# Patient Record
Sex: Male | Born: 1955 | Race: White | Hispanic: No | Marital: Single | Smoking: Current every day smoker
Health system: Southern US, Community
[De-identification: ages and names within clinical notes are randomized; demographics above are authoritative.]

## PROBLEM LIST (undated history)

## (undated) DIAGNOSIS — F329 Major depressive disorder, single episode, unspecified: Secondary | ICD-10-CM

## (undated) DIAGNOSIS — I10 Essential (primary) hypertension: Secondary | ICD-10-CM

## (undated) DIAGNOSIS — F419 Anxiety disorder, unspecified: Secondary | ICD-10-CM

## (undated) DIAGNOSIS — K219 Gastro-esophageal reflux disease without esophagitis: Secondary | ICD-10-CM

## (undated) DIAGNOSIS — I779 Disorder of arteries and arterioles, unspecified: Secondary | ICD-10-CM

## (undated) DIAGNOSIS — R569 Unspecified convulsions: Secondary | ICD-10-CM

## (undated) DIAGNOSIS — F32A Depression, unspecified: Secondary | ICD-10-CM

## (undated) DIAGNOSIS — I671 Cerebral aneurysm, nonruptured: Secondary | ICD-10-CM

## (undated) DIAGNOSIS — J449 Chronic obstructive pulmonary disease, unspecified: Secondary | ICD-10-CM

## (undated) DIAGNOSIS — E785 Hyperlipidemia, unspecified: Secondary | ICD-10-CM

## (undated) DIAGNOSIS — G4730 Sleep apnea, unspecified: Secondary | ICD-10-CM

## (undated) DIAGNOSIS — S0990XA Unspecified injury of head, initial encounter: Secondary | ICD-10-CM

## (undated) HISTORY — DX: Cerebral aneurysm, nonruptured: I67.1

## (undated) HISTORY — DX: Major depressive disorder, single episode, unspecified: F32.9

## (undated) HISTORY — DX: Unspecified injury of head, initial encounter: S09.90XA

## (undated) HISTORY — DX: Anxiety disorder, unspecified: F41.9

## (undated) HISTORY — DX: Hyperlipidemia, unspecified: E78.5

## (undated) HISTORY — DX: Depression, unspecified: F32.A

## (undated) HISTORY — DX: Essential (primary) hypertension: I10

## (undated) HISTORY — DX: Gastro-esophageal reflux disease without esophagitis: K21.9

## (undated) HISTORY — DX: Unspecified convulsions: R56.9

## (undated) HISTORY — DX: Sleep apnea, unspecified: G47.30

## (undated) HISTORY — DX: Chronic obstructive pulmonary disease, unspecified: J44.9

---

## 2004-06-15 ENCOUNTER — Emergency Department (HOSPITAL_COMMUNITY): Admission: EM | Admit: 2004-06-15 | Discharge: 2004-06-16 | Payer: Self-pay | Admitting: Emergency Medicine

## 2004-06-21 ENCOUNTER — Emergency Department (HOSPITAL_COMMUNITY): Admission: EM | Admit: 2004-06-21 | Discharge: 2004-06-22 | Payer: Self-pay | Admitting: *Deleted

## 2004-09-12 ENCOUNTER — Emergency Department (HOSPITAL_COMMUNITY): Admission: EM | Admit: 2004-09-12 | Discharge: 2004-09-12 | Payer: Self-pay | Admitting: *Deleted

## 2004-09-13 ENCOUNTER — Ambulatory Visit: Payer: Self-pay | Admitting: Physical Medicine & Rehabilitation

## 2004-09-13 ENCOUNTER — Inpatient Hospital Stay (HOSPITAL_COMMUNITY): Admission: AD | Admit: 2004-09-13 | Discharge: 2004-10-16 | Payer: Self-pay

## 2004-10-16 ENCOUNTER — Inpatient Hospital Stay (HOSPITAL_COMMUNITY)
Admission: RE | Admit: 2004-10-16 | Discharge: 2004-11-05 | Payer: Self-pay | Admitting: Physical Medicine & Rehabilitation

## 2004-10-16 ENCOUNTER — Ambulatory Visit: Payer: Self-pay | Admitting: Physical Medicine & Rehabilitation

## 2004-11-25 ENCOUNTER — Emergency Department (HOSPITAL_COMMUNITY): Admission: EM | Admit: 2004-11-25 | Discharge: 2004-11-25 | Payer: Self-pay | Admitting: Emergency Medicine

## 2004-11-28 ENCOUNTER — Ambulatory Visit (HOSPITAL_COMMUNITY)
Admission: RE | Admit: 2004-11-28 | Discharge: 2004-11-28 | Payer: Self-pay | Admitting: Physical Medicine & Rehabilitation

## 2004-12-03 ENCOUNTER — Ambulatory Visit: Payer: Self-pay | Admitting: Family Medicine

## 2004-12-09 ENCOUNTER — Ambulatory Visit (HOSPITAL_COMMUNITY): Admission: RE | Admit: 2004-12-09 | Discharge: 2004-12-09 | Payer: Self-pay | Admitting: Family Medicine

## 2004-12-21 ENCOUNTER — Emergency Department (HOSPITAL_COMMUNITY): Admission: EM | Admit: 2004-12-21 | Discharge: 2004-12-21 | Payer: Self-pay | Admitting: Emergency Medicine

## 2004-12-22 ENCOUNTER — Encounter
Admission: RE | Admit: 2004-12-22 | Discharge: 2005-03-22 | Payer: Self-pay | Admitting: Physical Medicine & Rehabilitation

## 2004-12-22 ENCOUNTER — Ambulatory Visit: Payer: Self-pay | Admitting: Orthopedic Surgery

## 2004-12-22 ENCOUNTER — Ambulatory Visit: Payer: Self-pay | Admitting: Physical Medicine & Rehabilitation

## 2004-12-31 ENCOUNTER — Ambulatory Visit: Payer: Self-pay | Admitting: Cardiology

## 2005-01-05 ENCOUNTER — Ambulatory Visit: Payer: Self-pay | Admitting: Cardiology

## 2005-01-05 ENCOUNTER — Ambulatory Visit (HOSPITAL_COMMUNITY): Admission: RE | Admit: 2005-01-05 | Discharge: 2005-01-05 | Payer: Self-pay | Admitting: Cardiology

## 2005-01-13 ENCOUNTER — Emergency Department (HOSPITAL_COMMUNITY): Admission: EM | Admit: 2005-01-13 | Discharge: 2005-01-13 | Payer: Self-pay | Admitting: Emergency Medicine

## 2005-01-14 ENCOUNTER — Ambulatory Visit: Payer: Self-pay | Admitting: Family Medicine

## 2005-01-29 ENCOUNTER — Ambulatory Visit (HOSPITAL_COMMUNITY): Admission: RE | Admit: 2005-01-29 | Discharge: 2005-01-29 | Payer: Self-pay | Admitting: Family Medicine

## 2005-02-05 ENCOUNTER — Emergency Department (HOSPITAL_COMMUNITY): Admission: EM | Admit: 2005-02-05 | Discharge: 2005-02-05 | Payer: Self-pay | Admitting: Emergency Medicine

## 2005-02-12 ENCOUNTER — Ambulatory Visit: Payer: Self-pay | Admitting: Physical Medicine & Rehabilitation

## 2005-03-16 ENCOUNTER — Ambulatory Visit: Payer: Self-pay | Admitting: Family Medicine

## 2005-03-24 ENCOUNTER — Encounter
Admission: RE | Admit: 2005-03-24 | Discharge: 2005-06-22 | Payer: Self-pay | Admitting: Physical Medicine & Rehabilitation

## 2005-04-15 ENCOUNTER — Ambulatory Visit: Payer: Self-pay | Admitting: Family Medicine

## 2005-04-17 ENCOUNTER — Ambulatory Visit: Payer: Self-pay | Admitting: Family Medicine

## 2005-04-24 ENCOUNTER — Ambulatory Visit: Payer: Self-pay | Admitting: Family Medicine

## 2005-04-24 ENCOUNTER — Ambulatory Visit: Payer: Self-pay | Admitting: Physical Medicine & Rehabilitation

## 2005-05-04 ENCOUNTER — Ambulatory Visit (HOSPITAL_COMMUNITY)
Admission: RE | Admit: 2005-05-04 | Discharge: 2005-05-04 | Payer: Self-pay | Admitting: Physical Medicine & Rehabilitation

## 2005-05-06 ENCOUNTER — Ambulatory Visit: Payer: Self-pay | Admitting: Family Medicine

## 2005-05-19 ENCOUNTER — Ambulatory Visit (HOSPITAL_BASED_OUTPATIENT_CLINIC_OR_DEPARTMENT_OTHER)
Admission: RE | Admit: 2005-05-19 | Discharge: 2005-05-19 | Payer: Self-pay | Admitting: Physical Medicine & Rehabilitation

## 2005-05-24 ENCOUNTER — Ambulatory Visit: Payer: Self-pay | Admitting: Internal Medicine

## 2005-06-01 ENCOUNTER — Ambulatory Visit: Payer: Self-pay | Admitting: Family Medicine

## 2005-07-21 ENCOUNTER — Encounter
Admission: RE | Admit: 2005-07-21 | Discharge: 2005-10-19 | Payer: Self-pay | Admitting: Physical Medicine & Rehabilitation

## 2005-07-21 ENCOUNTER — Ambulatory Visit: Payer: Self-pay | Admitting: Physical Medicine & Rehabilitation

## 2005-09-20 ENCOUNTER — Emergency Department (HOSPITAL_COMMUNITY): Admission: EM | Admit: 2005-09-20 | Discharge: 2005-09-20 | Payer: Self-pay | Admitting: Emergency Medicine

## 2005-10-14 ENCOUNTER — Encounter
Admission: RE | Admit: 2005-10-14 | Discharge: 2006-01-12 | Payer: Self-pay | Admitting: Physical Medicine & Rehabilitation

## 2005-10-31 ENCOUNTER — Emergency Department (HOSPITAL_COMMUNITY): Admission: EM | Admit: 2005-10-31 | Discharge: 2005-10-31 | Payer: Self-pay | Admitting: Emergency Medicine

## 2005-11-15 ENCOUNTER — Inpatient Hospital Stay (HOSPITAL_COMMUNITY): Admission: EM | Admit: 2005-11-15 | Discharge: 2005-11-17 | Payer: Self-pay | Admitting: Emergency Medicine

## 2005-12-10 ENCOUNTER — Inpatient Hospital Stay (HOSPITAL_COMMUNITY): Admission: EM | Admit: 2005-12-10 | Discharge: 2005-12-14 | Payer: Self-pay | Admitting: Emergency Medicine

## 2005-12-23 IMAGING — CT CT HEAD W/O CM
1 series · 15 of 30 positions shown, 19 images · non-contrast
Comparison: 09/20/2004.

CLINICAL DATA: Followup right frontal hemorrhagic contusion and
intraventricular hemorrhage.

HEAD CT WITHOUT CONTRAST
TECHNIQUE: 5mm collimated images were obtained from the base of the skull
through the vertex according to standard protocol without contrast.

[Series 2: brain · axial · 0.47mm/px · z∈[+149,+291]mm · 15 of 30 slices shown, 19 images]
[im 2/30  brain]
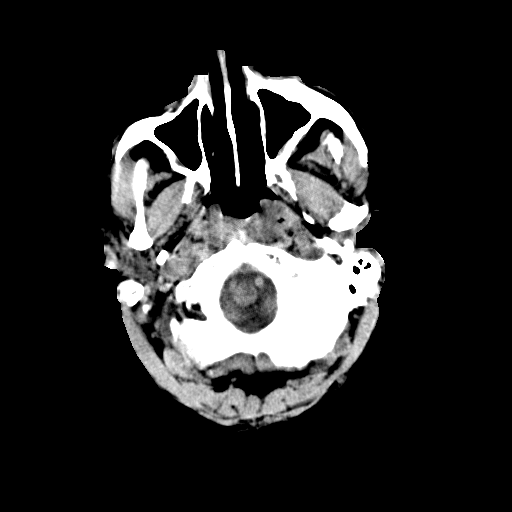
[im 2/30  bone]
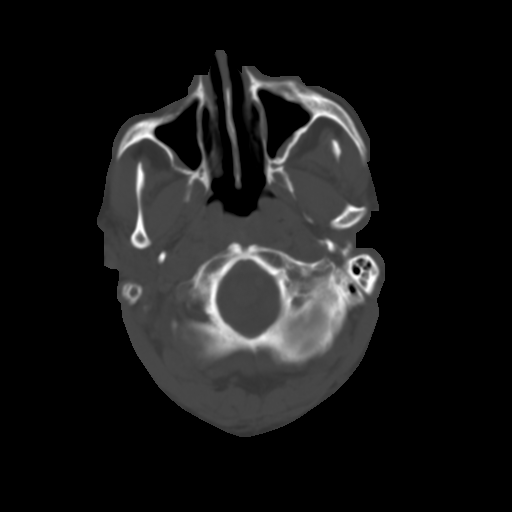
[im 4/30  brain]
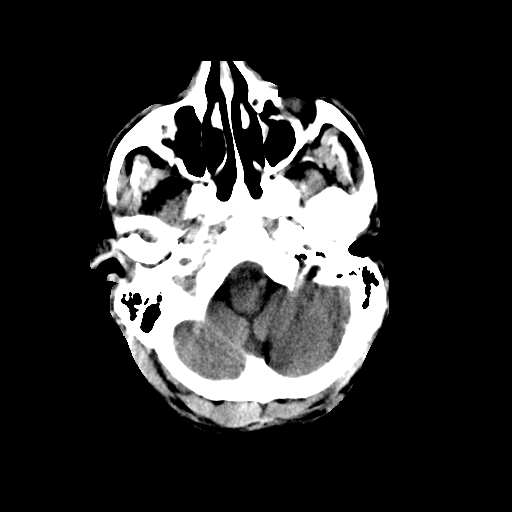
[im 6/30  brain]
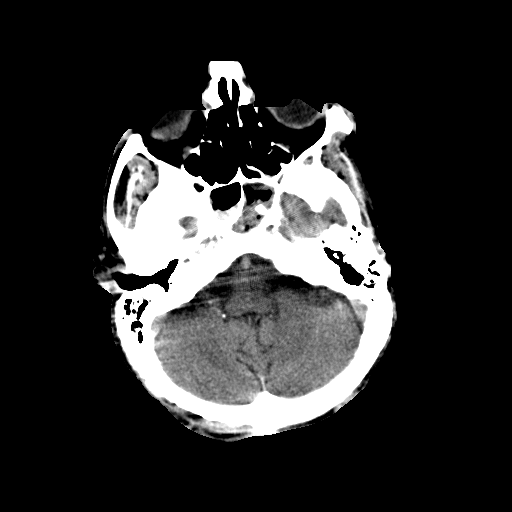
[im 8/30  brain]
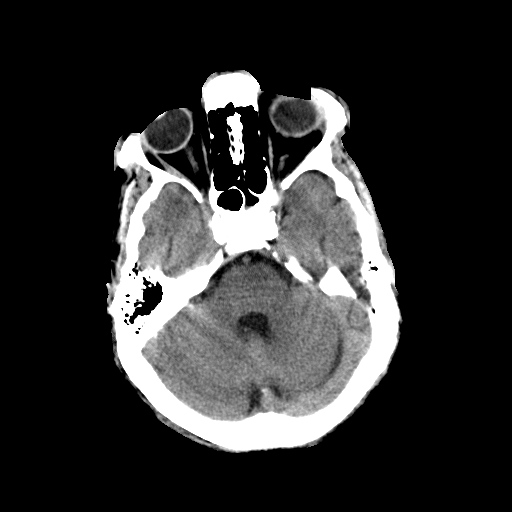
[im 10/30  brain]
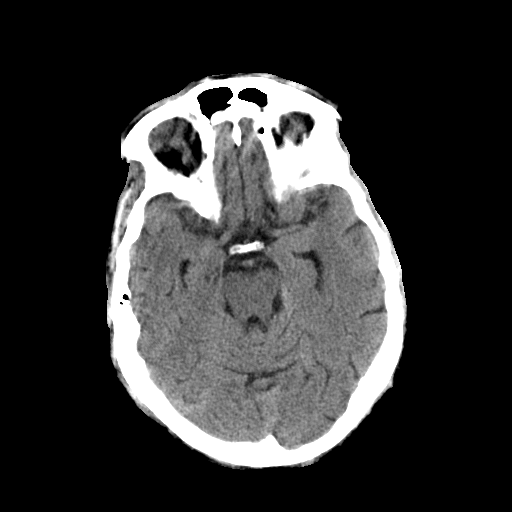
[im 10/30  bone]
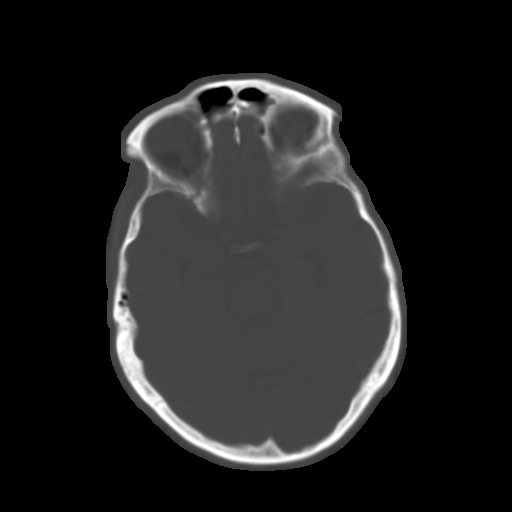
[im 12/30  brain]
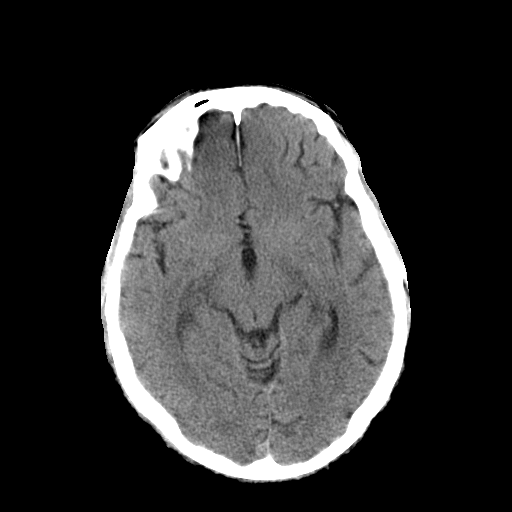
[im 14/30  brain]
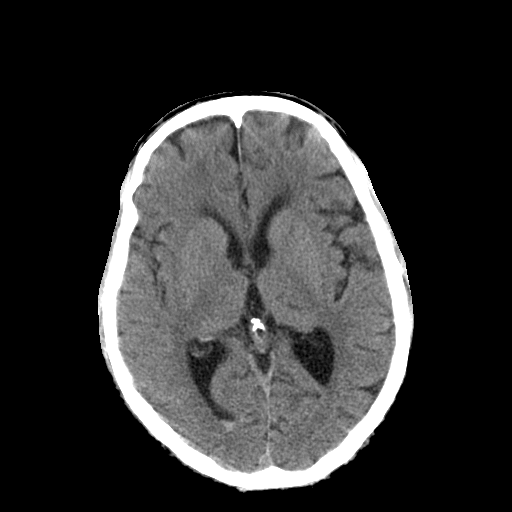
[im 16/30  brain]
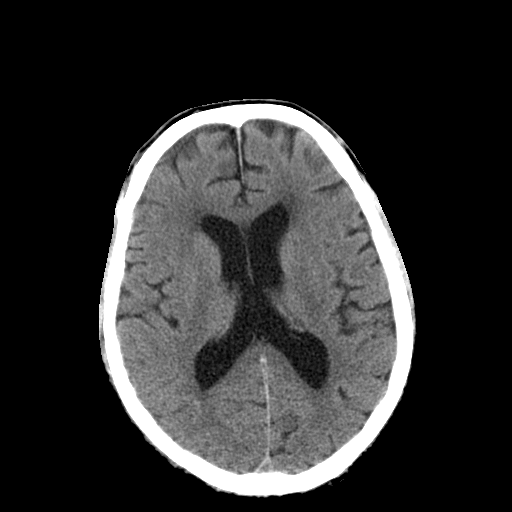
[im 17/30  brain]
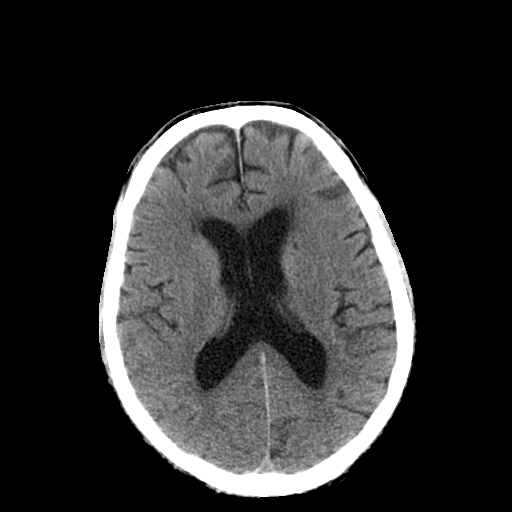
[im 17/30  bone]
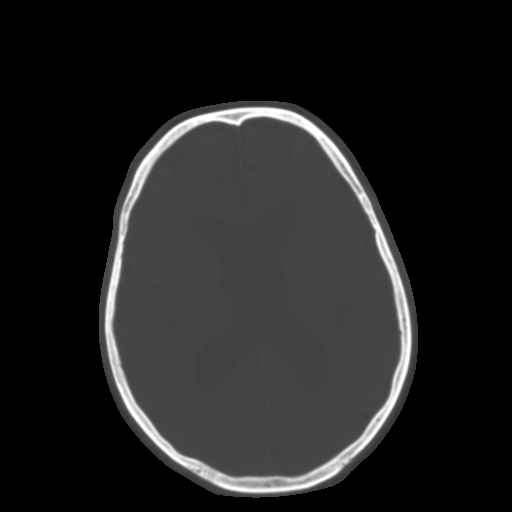
[im 19/30  brain]
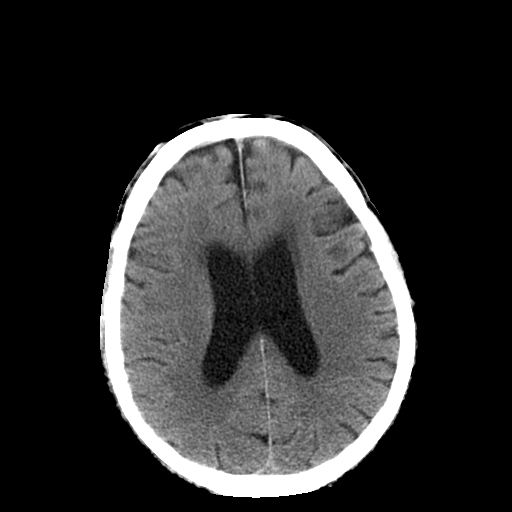
[im 21/30  brain]
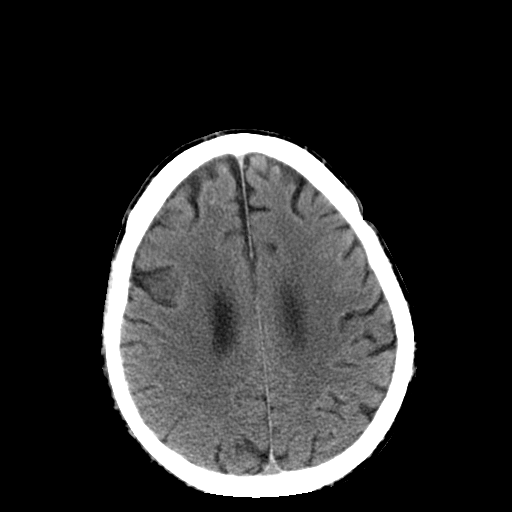
[im 23/30  brain]
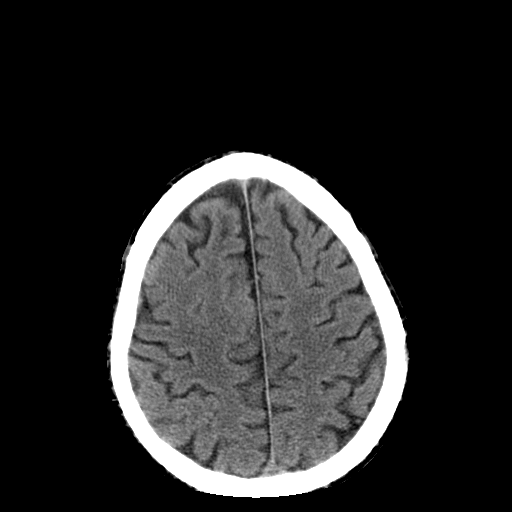
[im 25/30  brain]
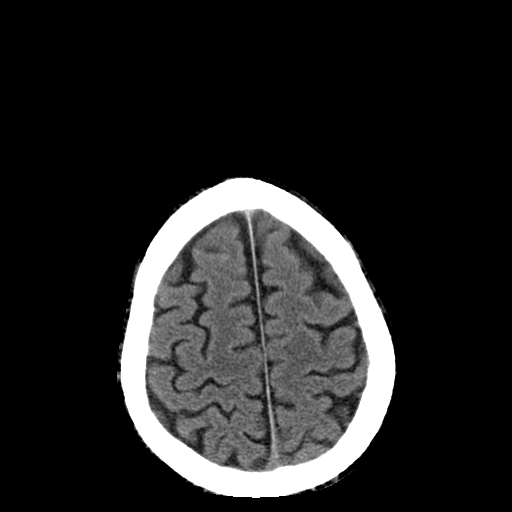
[im 25/30  bone]
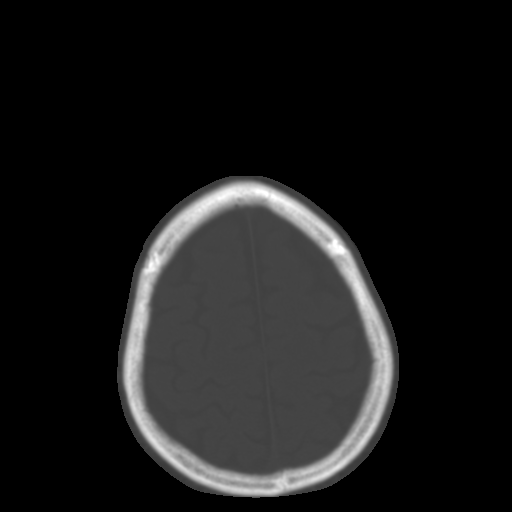
[im 27/30  brain]
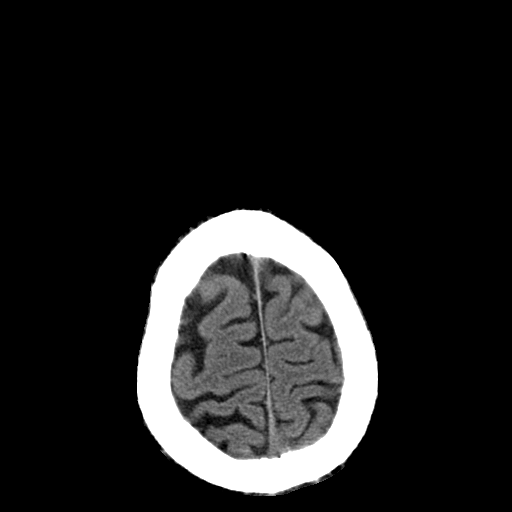
[im 29/30  brain]
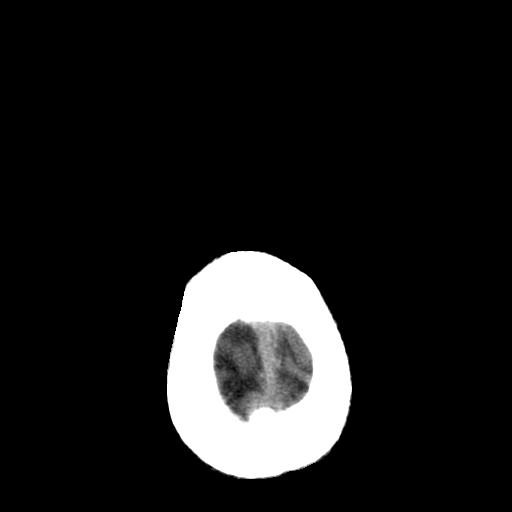

[15 of 30 positions shown; findings below may reference images not displayed]

FINDINGS: Further decrease in amount and density of blood within the occipital
horn of the right lateral ventricle. Almost complete resolution of the
previously demonstrated small areas of hemorrhage in the right frontal lobe with
progressive decrease in density of the adjacent brain parenchyma, compatible
with evolving encephalomalacia. Stable old left caudate head and right anterior
basal ganglia lacunar infarcts. Stable mildly prominent ventricles and
subarachnoid spaces. Stable mild white matter low density in the frontal lobes.
No new hemorrhage and no extra-axial fluid seen at this time. Two burr holes on
the left are again noted. Resolved bilateral ethmoid and frontal sinus mucosal
thickening with decreased bilateral sphenoid and maxillary sinus mucosal
thickening. Residual maxillary sinus mucosal thickening measures 5 mm in maximum
thickness on the left and 3 mm in maximum thickness on the right.

IMPRESSION

1. Further decrease in intraventricular hemorrhage.

2. Resolving right frontal lobe hemorrhagic contusions.

3. Stable mild diffuse cerebral atrophy, mild small vessel white matter ischemic
changes in both frontal lobes and small, old bilateral lacunar infarcts, as
described above. 

4. Improving chronic bilateral maxillary and sphenoid sinusitis with resolved
ethmoid and frontal sinusitis.

## 2005-12-26 IMAGING — CR DG CHEST 1V PORT
1 series · 1 of 1 positions shown · non-contrast
Comparison: 10/03/04.

CLINICAL DATA: Closed head injury.  Intracranial hemorrhage and pulmonary edema.
 PORTABLE SINGLE VIEW CHEST ? 10/04/04 ? [DATE]:

[view not recorded]
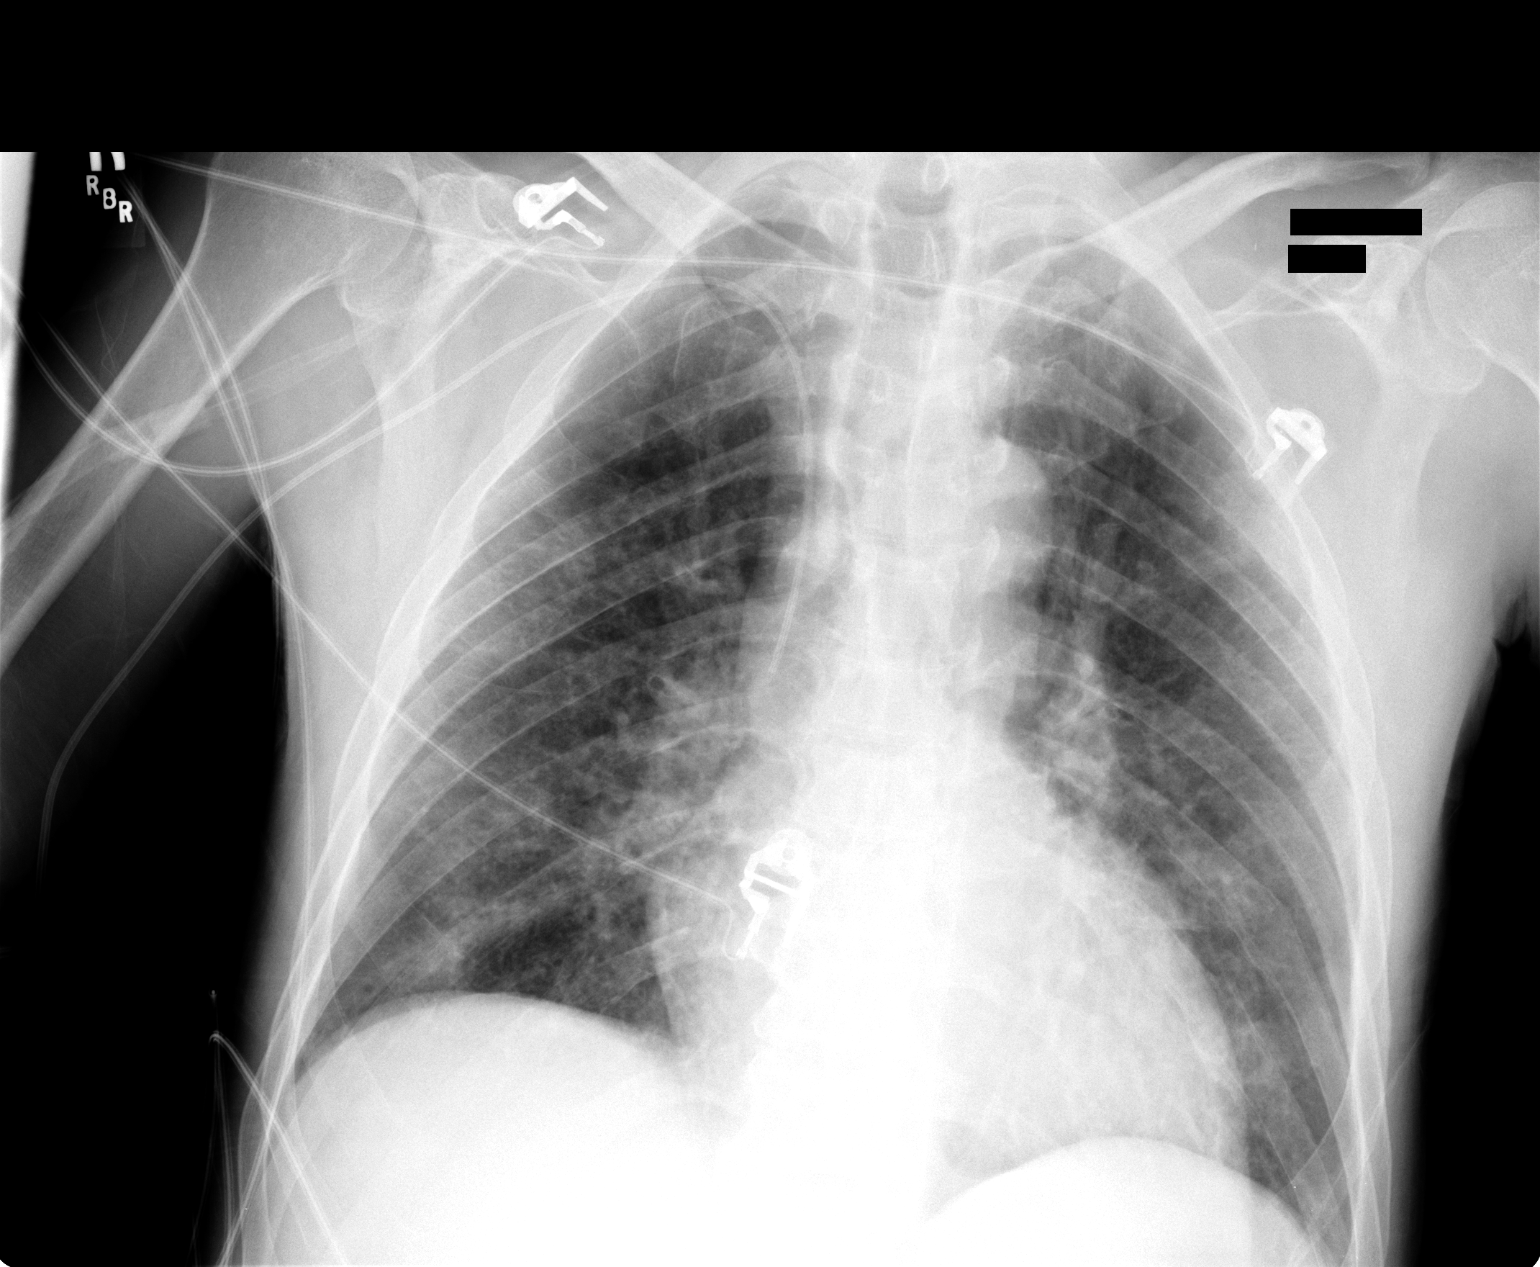

[1 of 1 positions shown; findings below may reference images not displayed]

Stable residual edema.  No pneumothorax or pleural effusions.  Stable cardiomegaly.
IMPRESSION: Stable edema.

## 2005-12-27 IMAGING — CR DG CHEST 1V PORT
1 series · 1 of 1 positions shown · non-contrast
Comparison: 10/04/04.

CLINICAL DATA: Closed head injury.  
 PORTABLE CHEST:

[view not recorded]
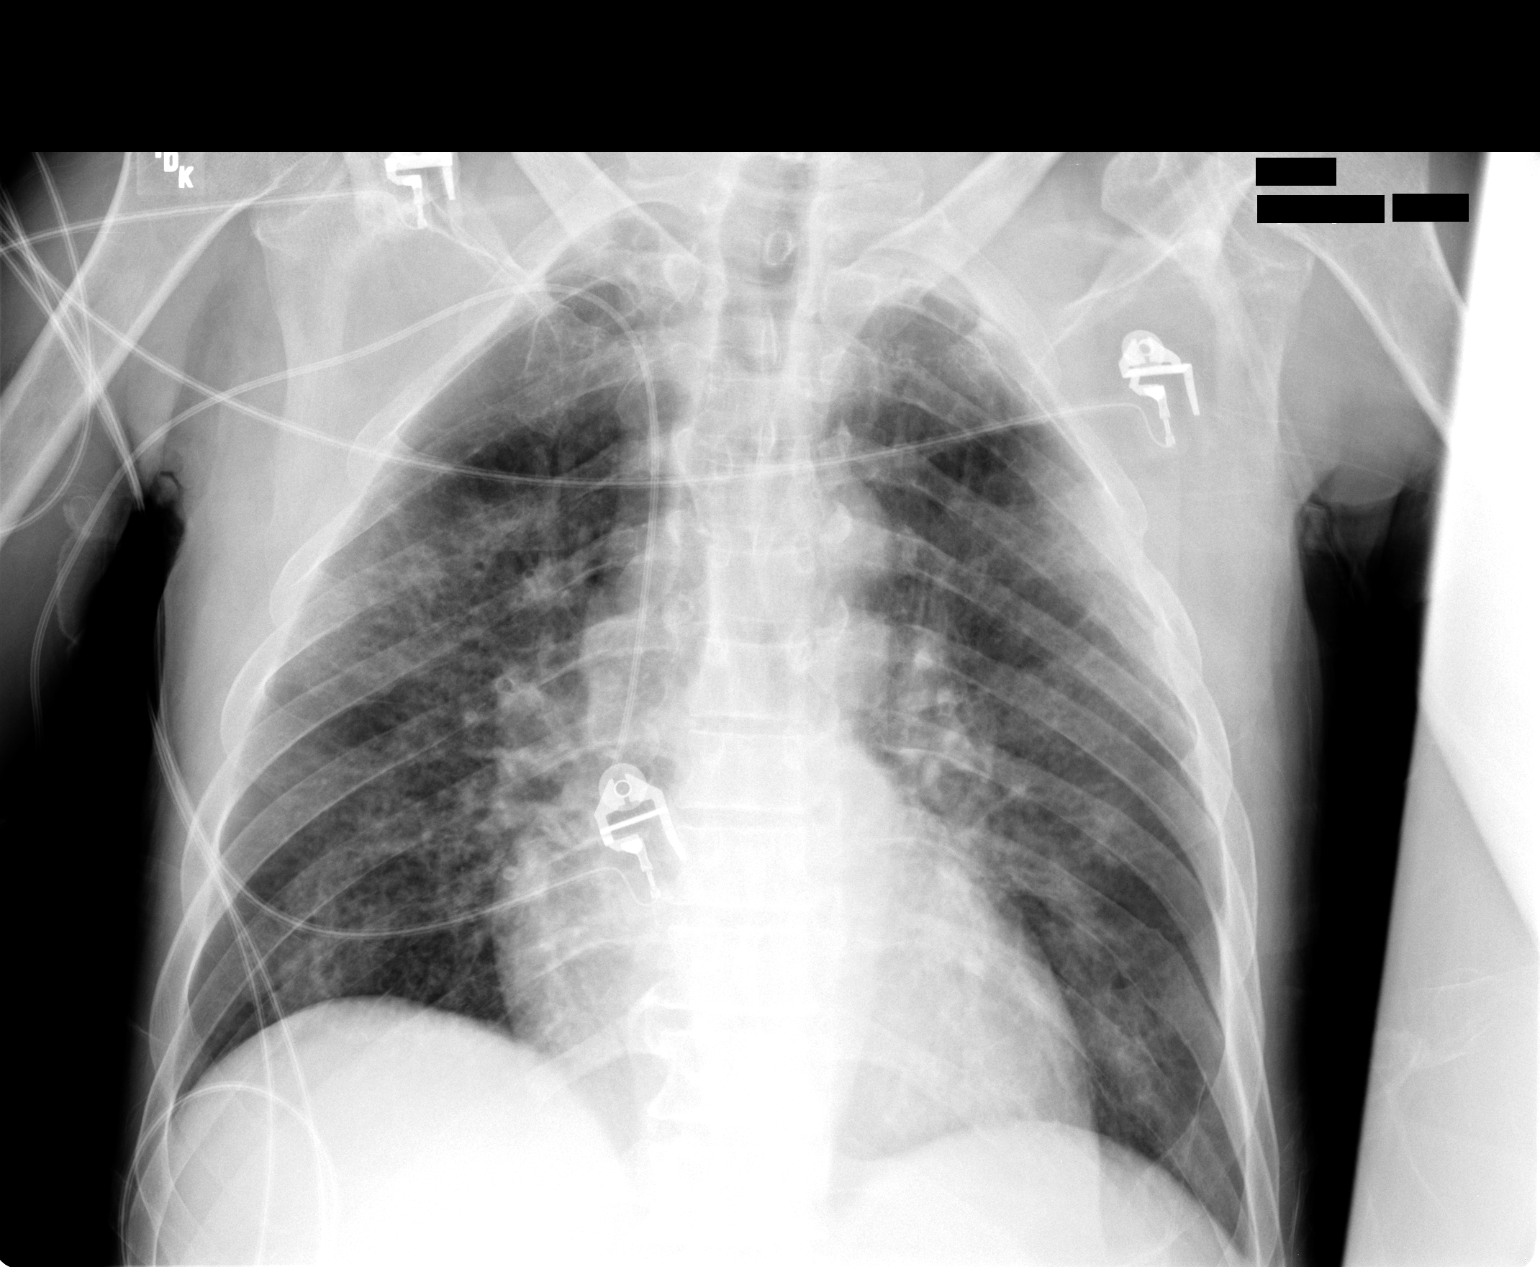

[1 of 1 positions shown; findings below may reference images not displayed]

PICC catheter tip remains in satisfactory position.  Patchy parenchymal opacities bilaterally unchanged.  Heart upper normal.
IMPRESSION: Negative for interval change.

## 2006-03-01 ENCOUNTER — Emergency Department (HOSPITAL_COMMUNITY): Admission: EM | Admit: 2006-03-01 | Discharge: 2006-03-01 | Payer: Self-pay | Admitting: *Deleted

## 2006-04-13 ENCOUNTER — Ambulatory Visit: Payer: Self-pay | Admitting: Physician Assistant

## 2006-05-28 ENCOUNTER — Ambulatory Visit: Payer: Self-pay | Admitting: Family Medicine

## 2006-06-06 ENCOUNTER — Emergency Department (HOSPITAL_COMMUNITY): Admission: EM | Admit: 2006-06-06 | Discharge: 2006-06-06 | Payer: Self-pay | Admitting: Emergency Medicine

## 2006-06-09 ENCOUNTER — Emergency Department (HOSPITAL_COMMUNITY): Admission: EM | Admit: 2006-06-09 | Discharge: 2006-06-09 | Payer: Self-pay | Admitting: Emergency Medicine

## 2006-08-13 ENCOUNTER — Ambulatory Visit: Payer: Self-pay | Admitting: Family Medicine

## 2006-09-14 ENCOUNTER — Ambulatory Visit: Payer: Self-pay | Admitting: Family Medicine

## 2006-10-05 ENCOUNTER — Ambulatory Visit: Payer: Self-pay | Admitting: Family Medicine

## 2006-10-13 ENCOUNTER — Ambulatory Visit: Payer: Self-pay | Admitting: Family Medicine

## 2007-01-26 ENCOUNTER — Ambulatory Visit (HOSPITAL_BASED_OUTPATIENT_CLINIC_OR_DEPARTMENT_OTHER): Admission: RE | Admit: 2007-01-26 | Discharge: 2007-01-26 | Payer: Self-pay | Admitting: Family Medicine

## 2007-01-30 ENCOUNTER — Ambulatory Visit: Payer: Self-pay | Admitting: Internal Medicine

## 2007-03-31 ENCOUNTER — Observation Stay (HOSPITAL_COMMUNITY): Admission: EM | Admit: 2007-03-31 | Discharge: 2007-04-01 | Payer: Self-pay | Admitting: Emergency Medicine

## 2007-03-31 ENCOUNTER — Ambulatory Visit: Payer: Self-pay | Admitting: Cardiology

## 2007-05-05 ENCOUNTER — Ambulatory Visit: Payer: Self-pay | Admitting: Cardiology

## 2008-01-04 ENCOUNTER — Inpatient Hospital Stay (HOSPITAL_COMMUNITY): Admission: EM | Admit: 2008-01-04 | Discharge: 2008-01-06 | Payer: Self-pay | Admitting: Emergency Medicine

## 2008-01-20 ENCOUNTER — Emergency Department (HOSPITAL_COMMUNITY): Admission: EM | Admit: 2008-01-20 | Discharge: 2008-01-21 | Payer: Self-pay | Admitting: Emergency Medicine

## 2008-06-05 DIAGNOSIS — J449 Chronic obstructive pulmonary disease, unspecified: Secondary | ICD-10-CM | POA: Insufficient documentation

## 2008-06-05 DIAGNOSIS — J690 Pneumonitis due to inhalation of food and vomit: Secondary | ICD-10-CM | POA: Insufficient documentation

## 2008-06-05 DIAGNOSIS — R51 Headache: Secondary | ICD-10-CM | POA: Insufficient documentation

## 2008-06-05 DIAGNOSIS — F339 Major depressive disorder, recurrent, unspecified: Secondary | ICD-10-CM | POA: Insufficient documentation

## 2008-06-05 DIAGNOSIS — K219 Gastro-esophageal reflux disease without esophagitis: Secondary | ICD-10-CM | POA: Insufficient documentation

## 2008-06-05 DIAGNOSIS — E785 Hyperlipidemia, unspecified: Secondary | ICD-10-CM | POA: Insufficient documentation

## 2008-06-05 DIAGNOSIS — I10 Essential (primary) hypertension: Secondary | ICD-10-CM | POA: Insufficient documentation

## 2008-06-05 DIAGNOSIS — Z8679 Personal history of other diseases of the circulatory system: Secondary | ICD-10-CM | POA: Insufficient documentation

## 2008-06-05 DIAGNOSIS — F17210 Nicotine dependence, cigarettes, uncomplicated: Secondary | ICD-10-CM | POA: Insufficient documentation

## 2008-06-05 DIAGNOSIS — F17200 Nicotine dependence, unspecified, uncomplicated: Secondary | ICD-10-CM

## 2008-06-05 DIAGNOSIS — F411 Generalized anxiety disorder: Secondary | ICD-10-CM | POA: Insufficient documentation

## 2008-06-05 DIAGNOSIS — I671 Cerebral aneurysm, nonruptured: Secondary | ICD-10-CM | POA: Insufficient documentation

## 2008-06-05 DIAGNOSIS — G4733 Obstructive sleep apnea (adult) (pediatric): Secondary | ICD-10-CM | POA: Insufficient documentation

## 2008-06-05 DIAGNOSIS — R519 Headache, unspecified: Secondary | ICD-10-CM | POA: Insufficient documentation

## 2008-06-05 DIAGNOSIS — R569 Unspecified convulsions: Secondary | ICD-10-CM | POA: Insufficient documentation

## 2008-06-05 DIAGNOSIS — F1011 Alcohol abuse, in remission: Secondary | ICD-10-CM | POA: Insufficient documentation

## 2008-10-02 ENCOUNTER — Encounter (INDEPENDENT_AMBULATORY_CARE_PROVIDER_SITE_OTHER): Payer: Self-pay | Admitting: *Deleted

## 2008-10-23 ENCOUNTER — Encounter (INDEPENDENT_AMBULATORY_CARE_PROVIDER_SITE_OTHER): Payer: Self-pay | Admitting: *Deleted

## 2009-03-21 ENCOUNTER — Encounter (INDEPENDENT_AMBULATORY_CARE_PROVIDER_SITE_OTHER): Payer: Self-pay | Admitting: *Deleted

## 2009-04-16 ENCOUNTER — Ambulatory Visit (HOSPITAL_COMMUNITY): Admission: RE | Admit: 2009-04-16 | Discharge: 2009-04-16 | Payer: Self-pay | Admitting: Internal Medicine

## 2009-04-16 ENCOUNTER — Ambulatory Visit: Payer: Self-pay | Admitting: Internal Medicine

## 2009-04-16 DIAGNOSIS — S0990XA Unspecified injury of head, initial encounter: Secondary | ICD-10-CM | POA: Insufficient documentation

## 2009-04-16 DIAGNOSIS — R1310 Dysphagia, unspecified: Secondary | ICD-10-CM | POA: Insufficient documentation

## 2009-04-25 ENCOUNTER — Encounter: Payer: Self-pay | Admitting: Internal Medicine

## 2009-04-26 ENCOUNTER — Encounter: Payer: Self-pay | Admitting: Internal Medicine

## 2009-05-07 ENCOUNTER — Ambulatory Visit (HOSPITAL_COMMUNITY): Admission: RE | Admit: 2009-05-07 | Discharge: 2009-05-07 | Payer: Self-pay | Admitting: Internal Medicine

## 2009-05-22 ENCOUNTER — Encounter: Payer: Self-pay | Admitting: Internal Medicine

## 2009-06-20 ENCOUNTER — Ambulatory Visit: Payer: Self-pay | Admitting: Internal Medicine

## 2009-06-21 ENCOUNTER — Encounter: Payer: Self-pay | Admitting: Internal Medicine

## 2010-07-06 ENCOUNTER — Encounter: Payer: Self-pay | Admitting: Internal Medicine

## 2010-07-06 ENCOUNTER — Encounter: Payer: Self-pay | Admitting: Physical Medicine & Rehabilitation

## 2010-07-15 NOTE — Letter (Signed)
Summary: TCS/EGD ORDER  TCS/EGD ORDER   Imported By: Ave Filter 06/21/2009 13:06:24  _____________________________________________________________________  External Attachment:    Type:   Image     Comment:   External Document
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
## 2010-10-28 NOTE — Procedures (Signed)
NAME:  [name], [name]           ACCOUNT NO.:  [id number]   MEDICAL RECORD NO.:  [id number]          PATIENT TYPE:  OUT   LOCATION:  SLEEP CENTER                 FACILITY:  [facility]   PHYSICIAN:  Clinton D. Maple Hudson, MD, FCCP, FACPDATE OF BIRTH:  12-24-1953   DATE OF STUDY:  01/26/2007                            NOCTURNAL POLYSOMNOGRAM   REFERRING PHYSICIAN:  Molly Maduro Day, MD   INDICATION FOR STUDY:  Hypersomnia with sleep apnea.   EPWORTH SLEEPINESS SCORE:  8/24.   BMI:  24.6.   WEIGHT:  157 pounds.   MEDICATIONS:  Listed and reviewed.   SLEEP ARCHITECTURE:  Total sleep time 368 minutes with sleep efficiency  of 76%.  Stage 1 of 3%, stage 2 of 81%, stage 3 absent, REM 16% of total  sleep time.  Sleep latency 81 minutes.  REM latency 258 minutes.  Awake  after sleep onset 34 minutes.  Arousal index 1.8.  Trazodone was taken  at 2050 p.m.   RESPIRATORY DATA:  Split study protocol.  Apnea/hypopnea index (AHI,  RDI) 22.6 obstructive events per hour indicating moderate obstructive  sleep apnea/hypopnea syndrome before CPAP.  This included 1 obstructive  apnea and 46 hypopneas before CPAP.  All sleep and events were supine.  REM AHI 12.1.  CPAP was titrated to 14 CWP, AHI 0.6 per hour.  A large  Respironics ComfortGel full face mask was chosen with heated humidifier.   OXYGEN DATA:  Moderate snoring with oxygen saturation to a nadir of 85%  before CPAP.  After CPAP control, saturation held 93% on room air.   CARDIAC DATA:  Normal sinus rhythm.   MOVEMENT-PARASOMNIA:  Occasional limb jerk, insignificant.   IMPRESSIONS-RECOMMENDATIONS:  1. Moderate obstructive sleep apnea/hypopnea syndrome, apnea/hypopnea      index 22.6 per hour, with all sleep and events supine.  Moderate      snoring with oxygen desaturation nadir 85%.  2. Successful continuous positive airway pressure titration to 14      centimeters of water pressure, apnea/hypopnea index 0.6 per hour.      A large Respironics  ComfortGel full face mask was chosen with      heated humidifier.      Clinton D. Maple Hudson, MD, [facility], FACP  Diplomate, Biomedical engineer of Sleep Medicine  Electronically Signed     CDY/MEDQ  D:  01/30/2007 14:10:56  T:  01/31/2007 09:23:02  Job:  [id number]

## 2010-10-28 NOTE — H&P (Signed)
Patrick, Bryant           ACCOUNT NO.:  [id number]   MEDICAL RECORD NO.:  [id number]          PATIENT TYPE:  OBV   LOCATION:  4736                         FACILITY:  [facility]   PHYSICIAN:  Gerrit Friends. Dietrich Pates, MD, FACCDATE OF BIRTH:  1956-01-09   DATE OF ADMISSION:  03/31/2007  DATE OF DISCHARGE:                              HISTORY & PHYSICAL   PRIMARY CARDIOLOGIST:  Dr. Juanito Doom   PRIMARY CARE PHYSICIAN:  [facility]   CHIEF COMPLAINT:  Chest pain.   HISTORY OF PRESENT ILLNESS:  [name] is a 55 year old male with no  previous history of coronary artery disease.  Per old reports there is a  possibility that he had some cardiac issues in [place] greater  than 7 years ago but no further details are available.  He has a history  of a brain injury in 2006.  He went to his family physician today for a  headache and while he was there it was discovered that he had been  having intermittent episodes of chest pain for the last 2 or 3 days.  He  was sent to the emergency room by EMS and cardiology was asked to  evaluate him.   [name]'s chest pain is episodic.  He is unable to quantify it but  states it gets pretty bad.  He has more than one episode a day.  The  episodes are not exertional and not positional and not associated with  meals.  The duration is brief.  There are no aggravating or alleviating  symptoms.  He has not tried any medication.  There is no associated  shortness of breath, nausea, vomiting or diaphoresis.  He states he had  an episode of pain this morning while walking in the house.  He cannot  provide further details.  He is currently symptom-free except for a  headache.   PAST MEDICAL HISTORY:  1. Status post evaluation for chest pain in 2006 as an outpatient by      Dr. Daleen Squibb with an adenosine Myoview showing mild inferior thinning,      no scar or ischemia, and an EF of 48%; echocardiogram showing an EF      of 60%  with no wall motion abnormalities and was positive for LVH.  2. Hypertension.  3. Ongoing tobacco use.  4. Hyperlipidemia.  5. History of EtOH abuse.  6. History of seizures.  7. Obstructive sleep apnea on CPAP.  8. Gastroesophageal reflux disease.  9. History of a fall with a closed head history in 2006.  10.History of aspiration pneumonia.  11.History of a brain aneurysm approximately 8 years ago treated with      surgery.  12.Chronic headaches.  13.Anxiety/depression.   SURGICAL HISTORY:  Status post cranial aneurysm surgery in [place], possibly clipping, as well as a gastrostomy tube after his  closed head injury in 2006.   ALLERGIES:  An old record lists a possible intolerance to CATAPRES.   CURRENT MEDICATIONS:  1. Lipitor 40 mg daily.  2. Xanax 1 mg t.i.d. p.r.n.  3. Tegretol 100 mg one-and-a-half tablets t.i.d.  4. Risperdal 1 mg b.i.d.  5. Detrol 2 mg daily.  6. Cymbalta 60 mg daily.  7. Metoprolol 50 mg daily.  8. Omeprazole 20 mg daily.  9. Desyrel 100 mg one-and-a-half tablets q.h.s.  10.Tussionex p.r.n.   SOCIAL HISTORY:  Lives in [place] with his girlfriend and is on  disability.  He has a greater than 50 pack-year history of ongoing  tobacco use and a history of EtOH abuse.  He denies drug use.   FAMILY HISTORY:  His mother is alive in her 18s and his father died per  the patient at age 66.  According to the patient, neither one had heart  disease but an old record says his dad died of an MI.  There are no  siblings with coronary artery disease.   REVIEW OF SYSTEMS:  He denies fevers, chills or sweats.  He has daily  headaches.  Chest pain is described above.  He complains of dyspnea on  exertion but denies orthopnea, edema or palpitations.  He occasionally  coughs and may wheeze at times.  He has problems with anxiety and  depression at times.  He denies arthralgias.  He denies reflux symptoms,  indigestion, heartburn, melena, hematemesis,  hemoptysis, or abdominal  pain.  Full 14-point review of systems is otherwise negative.   PHYSICAL EXAMINATION:  VITAL SIGNS:  Blood pressure 141/91, pulse 57,  respiratory rate 20.  GENERAL:  He is a well-developed, well-nourished white male in no acute  distress.  HEENT:  Normal.  NECK:  There is no lymphadenopathy, thyromegaly, bruit or JVD noted.  CVA:  His heart is regular in rate and rhythm with an S1 and S2 and no  significant murmur, rub or gallop is noted.  Distal pulses are intact in  all four extremities.  LUNGS:  He has a few rales in the bases and rhonchi with an increased  expiratory phase on his respirations.  ABDOMEN:  Soft and nontender with active bowel sounds.  His gastrostomy  site is well-healed.  SKIN:  No rashes or lesions are noted.  EXTREMITIES:  There is no cyanosis, clubbing or edema noted.  MUSCULOSKELETAL:  There is no joint deformity or effusion.  No spine or  CVA tenderness.  NEUROLOGIC:  He is alert and oriented.  Cranial II-XII grossly intact.   Chest x-ray and labs are pending.   EKG is sinus rhythm, rate 62, with no acute ischemic changes and no old  is available for comparison.   IMPRESSION:  Chest pain.  His girlfriend reports occasional episodes of  chest pain.  Details are uncertain.  His exam is benign and his EKG is  without ischemic changes.  He will have lab results followed closely.  He will be  admitted for observation.  MI will be ruled out.  A pharmacologic stress  test is arranged for the a.m.  He will be given p.r.n. Vicodin for his  headaches and continued on his other home medications.  The patient and  his girlfriend understand that he will not be given a prescription for  narcotics at discharge.  We will check a lipid profile.      Theodore Demark, PA-C      Gerrit Friends. Dietrich Pates, MD, [facility]  Electronically Signed    RB/MEDQ  D:  03/31/2007  T:  04/01/2007  Job:  [id number]

## 2010-10-28 NOTE — Group Therapy Note (Signed)
NAMEROMNEY, COMPEAN           ACCOUNT NO.:  [id number]   MEDICAL RECORD NO.:  [id number]          PATIENT TYPE:  INP   LOCATION:  A310                          FACILITY:  [facility]   PHYSICIAN:  Dorris Singh, DO    DATE OF BIRTH:  1955/10/16   DATE OF PROCEDURE:  01/04/2008  DATE OF DISCHARGE:                                 PROGRESS NOTE   The patient seen today resting comfortably.  States THAT he feels  better.  He was admitted originally for chest pain and was found to have  pneumonia.  He was also started on IV antibiotics, ceftriaxone and  Zithromax.  Today, he looks comfortable and has no complaints.   His current vitals are as follows:  Temperature 97.1, pulse 85,  respirations 16, blood pressure 96/57.  GENERAL:  The patient is a 51-year Caucasian male who is well-developed,  well-nourished in no distress.  HEART:  Regular rate and rhythm.  LUNGS:  Are clear to auscultation bilaterally.  No wheezes, rales or  rhonchi.  ABDOMEN:  Soft, nontender.  EXTREMITIES:  Positive pulses.  No ecchymosis, edema or cyanosis.   His current labs for today are as follows:  His ABG is within normal  limits except for a CO2 which is 34.6 and his O2 which is 39.2.  His  white count 12.4, hemoglobin is 16.2, hematocrit is 36.8 and his  platelet count is 238.   The patient was admitted today on the 22nd, and he seems to be doing  well.   ASSESSMENT AND PLAN:  Pneumonia.  Will continue with current therapy and  will continue to monitor him.      Dorris Singh, DO  Electronically Signed     CB/MEDQ  D:  01/04/2008  T:  01/04/2008  Job:  [id number]

## 2010-10-28 NOTE — Discharge Summary (Signed)
[name], KOSMICKI           ACCOUNT NO.:  [id number]   MEDICAL RECORD NO.:  [id number]          PATIENT TYPE:  INP   LOCATION:  A310                          FACILITY:  [facility]   PHYSICIAN:  Dorris Singh, DO    DATE OF BIRTH:  [birth year]   DATE OF ADMISSION:  01/04/2008  DATE OF DISCHARGE:  07/24/2009LH                               DISCHARGE SUMMARY   PRIMARY CARE PHYSICIAN:  He does not have one.   CONSULTS:  There were none.   RADIOLOGY TESTS:  Include a chest x-ray on July 22 which demonstrated  suboptimal inspiration accounting for mild left basilar atelectasis,  bronchopneumonia suspected in the right lung base.   His H and P was done by Dr. Luciana Axe.  Please refer to but to summarize the  patient is a 55 year old Caucasian male who presented with pleuritic  chest pain for about a day and increased cough.  He has a history of a  closed head injury so he is a poor historian but it was found that he  had right lower lobe pneumonia and was admitted to our service.  He was  started on ceftriaxone and azithromycin and started on inhalers as well.  He continued to progress without any problems and continued to do well.  At this point in time it was determined that he could be discharged to  home today.  The patient's vitals are stable.  Temperature 98.9, pulse  84, respirations 28, blood pressure 115/89.  He was seen alert and  oriented x3.  Heart is regular rate and rhythm.  Lungs clear to  auscultation bilaterally.  Abdomen soft, nontender and nondistended.  Extremities positive pulses.  His white count is 9.1, hemoglobin of 11.8  and hematocrit 35.2, platelet count of 236.  His chemistry is within  normal limits.  His condition is stable.  He will be discharged to home  on the following medications:   DISCHARGE MEDICATIONS:  1. Nitroglycerin 0.4 mg sublingual as needed.  2. Metoprolol 50 mg p.o. daily.  3. Cymbalta 60 mg p.o. daily.  4. Carbamazepine 100 mg 1 and 1/2  tabs three times a day.  5. Omeprazole 20 mg p.o. daily.  6. Lipitor 40 mg at bedtime.  7. B12 2 mg daily.  8. Risperidone 1 mg two times daily.  9. Benzonatate 100 mg cap three times a day for cough.  10.ProAir inhaler 90 mcg every 4-6 hours as needed.  11.Atrovent 18 mcg inhaler as needed.  12.Send him home on Zithromax 250 mg x5 days.  13.Avelox 400 mg one p.o. x5 days.      Dorris Singh, DO  Electronically Signed     CB/MEDQ  D:  01/06/2008  T:  01/06/2008  Job:  [id number]

## 2010-10-28 NOTE — Group Therapy Note (Signed)
[name], [name]           ACCOUNT NO.:  [id number]   MEDICAL RECORD NO.:  [id number]          PATIENT TYPE:  INP   LOCATION:  A310                          FACILITY:  [facility]   PHYSICIAN:  Dorris Singh, DO    DATE OF BIRTH:  1956-02-07   DATE OF PROCEDURE:  01/05/2008  DATE OF DISCHARGE:                                 PROGRESS NOTE   The patient is seen today resting comfortably in bed, still under drop  for precaution, states that he feels better.  We will start discharge  planning on him.  We will see if we can get home health to come assist  him with any issues that he may have.  We are still awaiting his H1N1  test and then at that point in time, we can go ahead and get him set up  to go home.   His vitals today are as follows:  Temperature 98.4, pulse 99,  respirations 20, blood pressure 136/87.  GENERAL:  The patient is 55 year old Caucasian male who is well-  developed, well-nourished in no acute distress.  HEART:  Regular rate and rhythm  LUNGS:  Actually clear to auscultation bilaterally.  No wheezes or  rhonchi.  ABDOMEN:  Soft, nontender.  EXTREMITIES:  Positive pulses, no ecchymosis, edema or cyanosis.   LABORATORIES TODAY:  White count is increased from yesterday at 13.2,  hemoglobin 11.4, hematocrit 34.7 and platelet count 226.  Sodium 136,  potassium 3.9 which is corrected, chloride 105, CO2 27, glucose 116, BUN  5, and creatinine 0.7.   ASSESSMENT/PLAN:  Pneumonia.  We will continue with current therapy.  He  is on Rocephin and Zithromax, and we will continue to monitor him.  Also, we will try to set him up with home health care, and we are  waiting is H1N1 results.  At that point in time, we will stop contact  precautions, and we will send the patient home.      Dorris Singh, DO  Electronically Signed     CB/MEDQ  D:  01/05/2008  T:  01/05/2008  Job:  [id number]

## 2010-10-28 NOTE — H&P (Signed)
[name], [name]           ACCOUNT NO.:  [id number]   MEDICAL RECORD NO.:  [id number]          PATIENT TYPE:  EMS   LOCATION:  ED                            FACILITY:  [facility]   PHYSICIAN:  Gardiner Barefoot, MD    DATE OF BIRTH:  1956/05/18   DATE OF ADMISSION:  01/04/2008  DATE OF DISCHARGE:  LH                              HISTORY & PHYSICAL   PRIMARY CARE PHYSICIAN:  Unassigned.   CHIEF COMPLAINT:  Chest pain.   HISTORY OF PRESENT ILLNESS:  This is a 55 year old male with a history  of MI and seizure disorder, here with pleuritic chest pain for about 1  day and increased cough.  The patient is a poor historian secondary to a  history of closed head injury.  The patient does deny any fever and any  other complaints, he only answers with one-word answers.  The patient  otherwise denies any nausea and vomiting or decreased p.o. intake.   PAST MEDICAL HISTORY:  1. Closed head injury.  2. MI/CAD.  3. Seizure disorder.   MEDICATIONS:  1. Prilosec 20 mg daily.  2. Cymbalta 60 mg daily.  3. Aspirin 81 mg daily.  4. Lopressor 50 mg daily.  5. Ativan 0.5 mg daily.  6. Trazodone 150 mg q.h.s.  7. Risperdal 1 mg b.i.d.  8. Tegretol 150 mg t.i.d.   ALLERGIES:  No known drug allergies.   SOCIAL HISTORY:  The patient is a current smoker.  He denies any alcohol  or drug use.   FAMILY HISTORY:  Noncontributory.   REVIEW OF SYSTEMS:  Negative except as per the history of present  illness.   PHYSICAL EXAMINATION:  VITAL SIGNS:  Temperature is 100.4, pulse of 110  to 126, BP is 98 to 116 over 66 to 67.  O2 saturation is 97%.  GENERAL:  The patient is awake and alert and oriented and appears in no  acute distress.  CARDIOVASCULAR:  Regular rate and rhythm with a 3/6 systolic ejection  murmur.  LUNGS:  Clear to auscultation bilaterally.  No crackles appreciated.  ABDOMEN:  Soft, nontender, nondistended, with positive bowel sounds and  no hepatosplenomegaly.  EXTREMITIES:   Without edema.   LABORATORY DATA:  ABG of 7.44/34/79/23.  CK-MB is less than 1, troponin  is 0.05.  BNP is 112.  Sodium 138, potassium 3.4, chloride 107, bicarb  25, BUN 8, creatinine 0.89, glucose 117.  WBC 12.4, hemoglobin 12,  platelets 238.  Chest x-ray with bronchopneumonia of the right lower  base.   ASSESSMENT AND PLAN:  1. Pneumonia.  We will start the patient on therapy for community-      acquired pneumonia with ceftriaxone and azithromycin and use      inhalers as needed.  2. Coronary artery disease.  We will continue with blood pressure      medications and aspirin.  3. Seizure disorder.  We will continue the patient's Tegretol.      Gardiner Barefoot, MD  Electronically Signed     RWC/MEDQ  D:  01/04/2008  T:  01/04/2008  Job:  [id number]

## 2010-10-28 NOTE — Assessment & Plan Note (Signed)
[facility]                            CARDIOLOGY OFFICE NOTE   TEOMAN, GIRAUD                  MRN:          [id number]  DATE:05/05/2007                            DOB:          1956-02-11    Mr. Pritt returns today post hospitalization. He was admitted with  chest pain. He ruled out for myocardial infarction.   His stress Myoview was low risk with an old inferior wall infarct but no  ischemia. He has an EF of 42%.   Since discharge, he has had some chest tightness which can last up to 10  minutes. His family has been treating it with inhaler. It slowly goes  away.   He continues to smoke unfortunately.   He has been having headaches, and they would like some oxycodone. I have  refused with him being on Risperdal, Tegretol, Cymbalta and Xanax.   He is taking:  1. Prilosec 20 mg a day.  2. Lipitor 40 mg a day.  3. Risperdal 1 mg p.o. b.i.d.  4. Tegretol 150 mg t.i.d.  5. Detrol.  6. Cymbalta 60 mg a day.  7. Lopressor 25 mg a day (he was discharged on 50 mg p.o. b.i.d.).  8. Xanax 1 mg p.o. t.i.d.  9. Aspirin 81 mg which has temporarily stopped.  10.Albuterol inhaler.  11.CPAP which is temporarily stopped.   His blood pressure today is 90/60. His pulse 67 and regular. His weight  is 163.  HEENT:  Pale, chronically ill, looks older than his stated age. PERRLA.  Extraocular movements intact. Sclerae are clear. Fascial asymmetry is  normal. He has got a slight tremor of the head with a bob.  NECK:  Is supple. Carotid upstrokes were equal bilaterally with bruits.  There is no JVD. Thyroid is not enlarged. Trachea is midline.  LUNGS:  Revealed decreased breath sounds throughout with rhonchi.  HEART:  Reveals a poorly appreciated PMI. Normal S1 and S2.  ABDOMINAL EXAM:  Is soft. Good bowel sounds.  EXTREMITIES:  Reveal no edema. Pulses were present but reduced.   EKG is normal.   His family also raised the question about his  blood pressure being low.  The only thing that I could stop would be Lopressor which is only at 25  mg a day. I told him not to be too concerned about it as long as he was  feeling okay.   He will follow up with Lorin Picket Long up at [facility]. We will see him back for his cardiac problems in a year.     Thomas C. Daleen Squibb, MD, [facility]  Electronically Signed    TCW/MedQ  DD: 05/05/2007  DT: 05/06/2007  Job #: [id number]   cc:   Lindaann Pascal, M.D.

## 2010-10-28 NOTE — Discharge Summary (Signed)
NAMECLARKE, [name]           ACCOUNT NO.:  [id number]   MEDICAL RECORD NO.:  [id number]          PATIENT TYPE:  OBV   LOCATION:  4736                         FACILITY:  [facility]   PHYSICIAN:  Veverly Fells. Excell Seltzer, MD  DATE OF BIRTH:  [birth year]   DATE OF ADMISSION:  03/31/2007  DATE OF DISCHARGE:  04/01/2007                               DISCHARGE SUMMARY   DICTATED FOR:  Dr. Tonny Bollman.   PRIMARY CARE PHYSICIAN:  [facility].   PROCEDURES PERFORMED DURING HOSPITALIZATION:  Adenosine Myoview Study.  A.  Equivocal findings with images suggesting periinfarct ischemia in  the inferior wall at the base in a patient with prior inferior wall  myocardial infarction.  However, polar map does not suggest  reversibility to this segment.  B.  Inferior wall hypokinesis, especially at the base septal  hypokinesis.  C.  QGS ejection fraction 42%, per Dr. Arnell Sieving.   FINAL DIAGNOSES:  1. Chest pain.      a.     Known inferior wall myocardial infarction in 2006.      b.     Negative cardiac enzymes throughout hospitalization.      c.     Low-risk nuclear study during hospitalization.      d.     Medical management.  2. Hypertension.  3. Ongoing tobacco abuse.  4. History of hyperlipidemia.  5. History of EtOH abuse.  6. History of seizures.  7. Obstructive sleep apnea on CPAP.  8. Gastroesophageal reflux disease.  9. History of aspiration pneumonia.  10.History of fall with closed head injury in 2006.  11.History of brain aneurysm approximately 8 years ago treated with      surgery.  12.Chronic headaches.  13.Anxiety and depression.   HOSPITAL COURSE:  This is a 55 year old Caucasian male with history of  prior inferior wall myocardial infarction.  The patient had also history  of brain injury in 2006.  He went to his family physician today for a  headache, and while he was there it was discovered that he had been  having  intermittent episodes of chest pain that lasted for the last 2-3  days.  He was sent to the emergency room by emergency medical service,  and cardiology was asked to see.   The patient was seen and examined by Theodore Demark, physician  assistant, and Dr. Gerrit Friends. Rothbart, cardiologist, and he was admitted  to rule out myocardial infarction with subsequent adenosine Myoview  scheduled if cardiac markers were negative.   The patient was essentially pain-free on arrival to the emergency room  at [facility], and his cardiac enzymes were cycled and found to be  negative x3.  The patient did undergo an adenosine stress Myoview on the  morning of 04/01/2007 with the above results revealing low-risk study.  The patient was seen and examined by Dr. Tonny Bollman on the day of  discharge and found to be stable.  He was advised to quit smoking, to be  medically compliant and to abstain from any alcohol use.  The patient  was also advised  to follow with Dr. Daleen Squibb and his primary care physician  for continued medical management.  The patient was not prescribed any  outpatient Vicodin.   DISCHARGE LABS:  Troponin 0.01, 0.01 and 0.01, respectively.  Cholesterol 138, triglycerides 121, HDL 39, LDL 75.  Sodium 135,  potassium 4.4, chloride 103, CO2 of 27, glucose 88, BUN 12, creatinine  0.86.  Hemoglobin 12.8, hematocrit 38.2, white blood cells 10.2,  platelets 245.  PTT of 25, PT 12.9, INR 1.   VITAL SIGNS ON DISCHARGE:  Blood pressure 128/86, pulse 62, respirations  20, temperature 97.6.  The patient weighs 73.3 kg.   DISCHARGE MEDICATIONS:  1. Lipitor 40 mg at bedtime.  2. Aspirin 325 mg daily.  3. Tegretol 100 mg p.o. 3 times daily.  4. Risperidone 1 mg p.o. b.i.d.  5. Cymbalta 40 mg daily.  6. Metoprolol 50 mg twice a day.  7. Vitriol 2 mg once a day.  8. Protonix 40 mg daily.  9. Xanax 1 mg 3 times daily.  10.Mucinex D12 100 mg as needed.  11.Albuterol inhaler as needed.    ALLERGIES:  INTOLERANCE TO CATAPRES.   FOLLOWUP PLANS AND APPOINTMENT:  1. The patient will follow with Dr. Juanito Doom on October 30 at 11:45      a.m. for continued cardiac management in patient with known      coronary artery disease and inferior wall myocardial infarction.  2. The patient will follow up with the doctors at [facility] for continued medical management.  The patient is      to make that appointment himself for followup.   Time spent with the patient, to include physician time, 45 minutes.      Bettey Mare. Lyman Bishop, NP      Veverly Fells. Excell Seltzer, MD  Electronically Signed   KML/MEDQ  D:  04/01/2007  T:  04/03/2007  Job:  [id number]   cc:   [facility]
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
## 2010-10-31 NOTE — Procedures (Signed)
NAMEKELLER, BOUNDS           ACCOUNT NO.:  [id number]   MEDICAL RECORD NO.:  [id number]          PATIENT TYPE:  REC   LOCATION:  RAD                           FACILITY:  [facility]   PHYSICIAN:  Thomas C. Wall, M.D.   DATE OF BIRTH:  Nov 25, 1955   DATE OF PROCEDURE:  DATE OF DISCHARGE:                                  ECHOCARDIOGRAM   INDICATIONS:  Chest pain which is 786.59.   The echocardiogram was of adequate technical quality.   CONCLUSION:  1.  Normal left and right atrial size.  2.  Normal left ventricular chamber size and overall systolic function.      Ejection fraction 60%.  There are no wall motion abnormalities.  3.  Mild to moderate left ventricular hypertrophy.  4.  Normal aortic and mitral valve.  5.  No right side obstruction.  6.  No pericardial effusion.       TCW/MEDQ  D:  01/05/2005  T:  01/05/2005  Job:  [id number]   cc:   Delaney Meigs, M.D.  [address]  [place]  [place] [address]  Fax: [phone]   Jesse Sans. Wall, M.D.

## 2010-10-31 NOTE — H&P (Signed)
[name], [name]           ACCOUNT NO.:  [id number]   MEDICAL RECORD NO.:  [id number]          PATIENT TYPE:  INP   LOCATION:  A217                          FACILITY:  [facility]   PHYSICIAN:  Margaretmary Dys, M.D.DATE OF BIRTH:  1956/02/14   DATE OF ADMISSION:  11/15/2005  DATE OF DISCHARGE:  LH                                HISTORY & PHYSICAL   PRIMARY CARE PHYSICIAN:  Unassigned.   ADMISSION DIAGNOSES:  1.  Aspiration pneumonia.  2.  Choking episode, probably seizure activity, rule out syncope, rule out      acute cerebrovascular accident.   CHIEF COMPLAINT:  Choking episode earlier today.   HISTORY OF PRESENT ILLNESS:  [name] is a 55 year old Caucasian male  who was brought into the emergency room by the spouse after she noticed that  he had a choking spell and almost stopped breathing.  Paramedics were said  to have revived the patient.  Initially the wife thought that the patient  choked on the food because he had too much food in the mouth, but in  retrospect she thinks he had a blank stare and then subsequently started  coughing.  The onset was fairly acute.   The patient also had some urinary incontinence but had no tonic-clonic  convulsions.  Evaluation in the emergency room revealed that the patient was  fairly stable.  At the time he arrived, the EMS had already removed all the  food from his mouth and he was comfortable and not in any significant  distress and did not need airway protection.  He was stable mostly in the  emergency room.   REVIEW OF SYSTEMS:  Ten-point review of systems otherwise negative.  Denies  any chest pain.  No headache, dizziness or lightheadedness.  No syncopal  episode.   PAST MEDICAL HISTORY:  1.  Coronary artery disease.  2.  Hypertension.  3.  Aneurysm.  4.  Anxiety.  5.  Myocardial infarction.  6.  Seizure disorder post trauma.  7.  History of alcohol abuse.   SURGICAL HISTORY:  History of prior brain operations  due to head injury.   MEDICATIONS AT HOME:  The patient is on:  1.  Risperdal 1 mg p.o. b.i.d.  2.  Trazodone 150 mg p.o. q.h.s.  3.  Tegretol 150 mg p.o. t.i.d.  4.  Lorazepam 0.5 mg p.o. b.i.d.  5.  The patient is also on Cymbalta 60 mg p.o. once a day.   ALLERGIES:  HE REPORTS NO KNOWN DRUG ALLERGIES.   FAMILY HISTORY:  Noncontributory.   SOCIAL HISTORY:  The patient is married.  Wife is present during this  interview.  He smokes and drinks alcohol.   OBJECTIVE:  Conscious, alert, comfortable, not in acute distress.  Well  oriented in time, place and person.  The patient does not remember the  incident bringing him to the emergency room.  VITAL SIGNS:  Blood pressure was 106/77, pulse of 71, respirations 20,  temperature 98.3.  Oxygen saturation was 100% on a nonrebreather.  HEENT EXAM:  Normocephalic, atraumatic.  Oral mucosa was moist with no  exudates.  NECK:  Supple.  No JVD, no lymphadenopathy.  LUNGS:  Occasional crackles at the bases but was mostly clear with no  wheeze, no rhonchi.  HEART:  S1 S2 regular.  No S3, S4, gallops or rubs.  ABDOMEN:  Soft, nontender, bowel sounds were positive.  EXTREMITIES:  No pitting pedal edema.  No calf induration or tenderness was  noted.  CENTRAL NERVOUS SYSTEM EXAM:  Grossly intact.   LABORATORY/DIAGNOSTIC DATA:  Reviewed in echart.  The patient had  leukocytosis.  Otherwise the rest of his labs were negative.  His chest x-  ray does show bilateral infiltrates concerning for ARDS versus pneumonitis.  I suspect this is mostly aspiration pneumonitis.   ASSESSMENT AND PLAN:  [name] is a 55 year old Caucasian male brought  into the emergency room by EMS today after the wife called 9-1-1 after a  choking episode.  It appears the patient may have had a seizure about the  time he had the choking episode.  The history is poor, but I was able to  document the patient had urinary incontinence suggesting a seizure.  The  patient has  an aspiration pneumonitis at this time but is otherwise stable.  The plan is to admit him to 2A.  Will continue to monitor him closely.  Will  place him on aspiration precautions and seizure precautions.  Will check a  serum Tegretol level on him.  Will resume all his home medications.  Will  treat him empirically with Levaquin and clindamycin.  I suspect this is more  pneumonitis than pneumonia but will continue to monitor him very closely.   CODE STATUS:  He is a full code.   I have discussed the above plan with the wife who verbalized full  understanding.      Margaretmary Dys, M.D.  Electronically Signed     AM/MEDQ  D:  11/16/2005  T:  11/16/2005  Job:  [id number]

## 2010-10-31 NOTE — Assessment & Plan Note (Signed)
HISTORY OF PRESENT ILLNESS:  [name] is back regarding his traumatic brain  injury.  Has been doing fairly well at home.  His agitation has been much  better controlled.  We did increase his Lexapro recently for depression and  wife notes continued issues with his mood and thoughts of increased self  value, and she notes that he has poor initiation and does not want to do a  lot from a social standpoint.  The patient is denying any pain.  PEG site  has healed, although there is still a hole where the tube was placed.  The  patient is doing well with his gait and mobility.  His dizziness and tremor  has been decreasing.  Wife notes some ongoing problems with day-to-day  memory and organization tasks, but this seems to be slowly improving as  well.   REVIEW OF SYSTEMS:  Pertinent positive as listed above.  Full review is in  the Health and History section of the chart.   SOCIAL HISTORY:  The patient lives with his girlfriend and mother currently.   PHYSICAL EXAMINATION:  VITAL SIGNS:  Blood pressure 150/87, pulse 67,  respirations 16, saturation 97% on room air.  GENERAL APPEARANCE:  The patient is pleasant, in no acute distress.  Alert  and oriented x3.  Mood is generally appropriate and pleasant.  He is non-  agitated.  Gait is choppy but stable.  He had some difficulty with heel-to-  toe ambulation today.  Reflexes are 2+, sensation is normal.  Patient has  decreased attention awareness and memory, although, his awareness has  improved.  Motor function is 5/5, hand and eye coordination is improved.  HEART:  Regular rate and rhythm.  LUNGS:  Clear.  ABDOMEN:  Soft, nontender.  Abdominal site was examined, and there still is  a rather large hole, approximately 1-2 cm in depth and approximately 0.5 cm  in diameter which did not appear to be draining.  There appeared to be some  granulation deep in the tissue, and it was hard to discern if there was any  fibronecrotic material over  top.   ASSESSMENT:  1.  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  2.  Emotional dyscontrol syndrome.  3.  Dysphagia.  4.  Hypertension.  5.  Tobacco use.  6.  Insomnia.  7.  Depression.  8.  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   PLAN:  1.  Will switch patient from Lexapro to Cymbalta titrating up to 60 mg of      Cymbalta daily over one weeks time.  2.  Will stay with Risperdal and Tegretol at current doses.  3.  We discussed the sleep apnea results and encouraged side lying while      sleeping to avoid some of the snoring related to his soft palate.  4.  Encouraged aggressive hygiene to the prior PEG site area.  5.  Encouraged increased socialization and household activity.  6.  I will see the patient back in three months time.      Ranelle Oyster, M.D.  Electronically Signed     ZTS/MedQ  D:  07/22/2005 12:33:51  T:  07/22/2005 13:52:32  Job #:  [id number]

## 2010-10-31 NOTE — Assessment & Plan Note (Signed)
MEDICAL RECORD NUMBER:  [id number]   INTERVAL HISTORY:  Patient is back regarding his traumatic brain injury.  He  has been at home with his fiance.  Headaches have generally improved.  He  did have a fall the other day when he lost his balance, perhaps catching his  foot on a carpet.  He might have had a slight concussion but really no ill  effects were noted.  He does note some problems with his sleep habits.  He  is going to bed at intermittent times.  He is taking Risperdal at 8 o'clock  and taking trazodone at 8 p.m. to 8:30.  He is not taking medicines directly  before he goes to bed.  He remains on Tegretol 150 mg t.i.d.  He uses the  Risperdal also in the morning.  He uses Ativan as needed for severe  agitation.  His wife is planning to go back to work soon.  When she has gone  back to work, the patient will stay with his mother.  Overall patient does  get irritable but agitation has improved and he is generally more  manageable.  He does sometime sleep during the day.  He has not been  participatory with his therapies and has really plateaued and therapy has  stopped coming to the house.  Patient tends to lack some motivation and  there is some question of depression going on.  Patient rates his pain at a  5-6/10 and that takes place when he has a headache.  Pain improves with rest  and sometimes heat and ice.   REVIEW OF SYSTEMS:  On review of systems patient reports trouble walking,  dizziness, confusion, depression, anxiety, occasional bladder control  problems, easy bruising, constipation, coughing, and wheezing.   SOCIAL HISTORY:  Pertinent positives listed above.  He lives with his  fiance.  He smokes a half pack per day.   PHYSICAL EXAMINATION:  Blood pressure is 100/68, pulse is 70, respiratory  rate 16, he is saturating 98% in room air.  Patient is pleasant, alert and  oriented.  He is alert to place and person and somewhat to date.  He is able  to follow commands.  He  is minimally irritable.  He is able to focus fairly  well.  He laughs easily.  He had some falling to the right somewhat today  with heel-to-toe gait walking.  Romberg test was positive.  Reflexes were 2+  in the right and 1+ in the left.  Sensation was slightly decreased on the  right side.  Strength was 4/5 and 4+ to 5/5 on the left.  Stomach was  nontender/nondistended.  Heart was regular rate and rhythm and lungs were  clear.   ASSESSMENT:  1.  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  2.  Emotional dyscontrol.  3.  Hypertension.  4.  Tobacco use.  5.  Insomnia.   PLAN:  1.  Continue trazodone 150 mg at bedtime but I would like the patient to      take this at 9 to 9:30 at night and not so as early as she has been      giving it to him.  Also, he should take his 1 mg Risperdal at the same      time.  2.  We will start Lexapro which we will give him at 9 p.m. as well nightly.  3.  Decreased Ativan to  0.5 mg q.12 h. p.r.n. only.  His agitation is      improving.  4.  Discontinue over 1 week's time the morning Risperdal dose.  5.  Continue Tegretol 150 mg t.i.d.  6.  We will sign the patient up for outpatient physical, occupational and      speech therapy.  We will have this done at [facility].  7.  I will see the patient back in about 6 weeks' time.  I spoke at length      today with the patient and his wife totaling 45 minutes to an hour.      Ranelle Oyster, M.D.  Electronically Signed     ZTS/MedQ  D:  02/13/2005 16:15:38  T:  02/13/2005 19:37:36  Job #:  [id number]

## 2010-10-31 NOTE — Discharge Summary (Signed)
[name], [name]           ACCOUNT NO.:  [id number]   MEDICAL RECORD NO.:  [id number]          PATIENT TYPE:  INP   LOCATION:  A217                          FACILITY:  [facility]   PHYSICIAN:  Margaretmary Dys, M.D.DATE OF BIRTH:  [birth year]   DATE OF ADMISSION:  11/15/2005  DATE OF DISCHARGE:  06/05/2007LH                                 DISCHARGE SUMMARY   DISCHARGE DIAGNOSES:  1.  Aspiration pneumonia.  2.  Probable seizure activity, cannot rule out syncope.   DISCHARGE MEDICATIONS:  1.  Risperdal 1 mg p.o. b.i.d.  2.  Trazodone 150 mg p.o. q.h.s.  3.  Tegretol 150 mg p.o. t.i.d.  4.  Lorazepam 0.5 mg p.o. b.i.d.  5.  Cymbalta 60 mg p.o. once a day.  6.  Levaquin 750 mg p.o. once a day.   DIET:  Stage II dysphagia diet.   ACTIVITY:  As tolerated.  Seizure precautions advised at all times.   LABORATORY DATA:  On admission, the patient had some leukocytosis.  Otherwise BMET was negative.  Chest x-ray shows bilateral infiltrates likely  suggestive of aspiration pneumonitis.   HOSPITAL COURSE:  [name] is a 55 year old Caucasian male who was  brought into the emergency room by his spouse after she noticed that he had  a choking spell and almost stopped breathing.  Paramedic was said to have  revived the patient.  Initially, the wife thought that the patient choked on  the food because he had too much food in the mouth but in retrospect he  thinks he had a blank stare and subsequently started coughing.  The onset  was fairly acute.  Please note the patient has a history of seizure  activity.  He also had some urinary incontinence but no tonoclonic  convulsions.  Evaluation in the emergency room revealed that the patient was  fairly stable.  By the time he arrived, EMS removed all the food from his  mouth and he was comfortable and not in any significant distress and did not  need airway protection.  He was stable for the most part in the emergency  room.   The  patient was subsequently admitted and started on Levaquin and  Clindamycin.  During his hospitalization, he did not have any more seizure  activity or choking spells.  His Tegretol level was noted to be  subtherapeutic. Overall, he did not become leukocytotic.  He did not have  leukocytosis, neither did have hypoxemia or did he become febrile.  The  patient was observed during his hospital course and he did fairly well with  no new concerns.  The patient was subsequently discharged home on November 17, 2005 in satisfactory condition.  We think the patient likely had a seizure  activity with aspiration pneumonitis.   FOLLOW UP:  The patient is to follow up with primary care physician in the  next two to three weeks.   DISPOSITION:  To home.      Margaretmary Dys, M.D.  Electronically Signed     AM/MEDQ  D:  12/02/2005  T:  12/02/2005  Job:  [id number]

## 2010-10-31 NOTE — Assessment & Plan Note (Signed)
HISTORY OF PRESENT ILLNESS:  [name] is back regarding his traumatic brain  injury and dysphagia. He has been okay with nectar liquids. He has been  eating and drinking well. He also would like his peg out today. He has not  eaten today. The wife states that his mood has been much improved with the  re-initiation of the Risperdal and increase of Lexapro. Things seem to be  going fairly well at home. The patient has some mild discomfort in the  abdomen due to the peg. He also has some mild headache, which seems to be  improving.   The patient describes occasional tremor and dizziness. Cognition still  improving but confusion is noted by his wife. The patient has recent sinus  symptoms and wheezing. Other review of systems matters are in the health and  history section of the chart.   SOCIAL HISTORY:  He lives with his girlfriend and mother. He still is  smoking.   PHYSICAL EXAMINATION:  VITAL SIGNS:  Blood pressure 127/63, pulse 61,  respiratory rate 16, sating 98% on room air.  GENERAL:  The patient is pleasant. No acute distress.  NEUROLOGIC:  He is alert and oriented times three. Affect is bright and  appropriate. Gait is slightly wide based and stable. The patient does lack  some initiation. Coordination actually was fairly intact today. Reflexes are  2+. Sensation was normal. The patient still had short term memory deficits  and problems with simple sequencing but this was essentially unchanged from  last visit.  HEART:  Regular rate and rhythm.  LUNGS:  Clear.  ABDOMEN:  Soft, nontender. Intact with good bowel sounds. Peg was in place.   ASSESSMENT:  1.  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  2.  Emotional dyscontrol.  3.  Dysphagia.  4.  Hypertension.  5.  Tobacco use.  6.  Insomnia.   PLAN:  1.  Continue Risperdal and Lexapro at current doses. Use Ativan for severe      agitation. Tegretol is at 150 mg three times a day. Will check level  of      Tegretol and CBC today.  2.  Await sleep apnea test results.  3.  We removed his peg today with traction. Dry pressure dressing was      applied. No complications were experienced. Wife was given post-removal      instructions.  4.  Will see the patient back in about 2 months time.      Ranelle Oyster, M.D.  Electronically Signed     ZTS/MedQ  D:  05/25/2005 09:37:03  T:  05/25/2005 11:53:55  Job #:  [id number]

## 2010-10-31 NOTE — Procedures (Signed)
NAME:  [name], [name]           ACCOUNT NO.:  [id number]   MEDICAL RECORD NO.:  [id number]          PATIENT TYPE:  OUT   LOCATION:  SLEEP CENTER                 [facility]ACILITY[facility]  [facility]   PHYSICIAN:  Clinton D. Maple Hudson, M.D. DATE OF BIRTH:  1955/12/22   DATE OF STUDY:                              NOCTURNAL POLYSOMNOGRAM   REFERRING PHYSICIAN:  Dr. Faith Rogue.   DATE OF STUDY:  May 19, 2005.   INDICATION FOR STUDY:  Hypersomnia with sleep apnea.   EPWORTH SLEEPINESS SCORE:  12/24.   BMI:  21.   WEIGHT:  138 pounds.   There is significant history of head injury. Wife stayed with the patient to  assist.   SLEEP ARCHITECTURE:  Total sleep time 328 minutes with sleep efficiency 77%.  Stage I was 4%, stage II 81%, stages III and IV 2%, REM 13% of total sleep  time. Sleep latency 53 minutes, REM latency 157 minutes, awake after sleep  onset 47 minutes, arousal index 12. Bedtime medication was not reported.   RESPIRATORY DATA:  Apnea/hypopnea index (AHI, RDI) 8 obstructive events per  hour. All events were hypopneas totaling 44. All sleep was supine. REM AHI  was 11.4 per hour. He did not have enough early events to qualify for C-PAP  titration by protocol.   OXYGEN DATA:  Very loud snoring with oxygen desaturation to a nadir of 90%.  Mean oxygen saturation through the study was 95% on room air.   CARDIAC DATA:  Normal sinus rhythm.   MOVEMENT/PARASOMNIA:  A total of 277 limb jerks were reported of which 14  were associated with arousal or awakening for periodic limb movement with  arousal index of 2.6 per hour which is slightly increased. Bathroom x1.   IMPRESSION/RECOMMENDATIONS:  1.  Mild obstructive sleep apnea/hypopnea syndrome, AHI 8 per hour (normal 0-      5 per hour). All events were hypopneas. He only slept supine so      positional importance cannot be determined.  2.  Scores in this range are marginal for consideration of C-PAP therapy      until more conservative  measures such      as weight loss, sleeping off flat of back and treatment for nasal      congestion have been considered.  3.  Frequent limb jerks with mild sleep disturbance, proximal limb movement      with arousal 2.6 per hour.      Clinton D. Maple Hudson, M.D.  Diplomate, Biomedical engineer of Sleep Medicine  Electronically Signed     CDY/MEDQ  D:  05/24/2005 11:10:26  T:  05/24/2005 23:04:40  Job:  [id number]

## 2010-10-31 NOTE — Assessment & Plan Note (Signed)
[name] is back regarding his traumatic brain injury.  He  has been  eating well at home.  His balance is improving.  He still has occasional  headaches.  His mood has not leveled off, and clearly there are still some  agitated episodes noted by wife and mother.  We took off his morning  Risperdal, and agitated behavior is noted particularly with interpersonal  relations.  He has not done anything physical at this point, usually just  acts in an angry manner.  The patient is unable to control his mood and is  unable to walk out of the room during a situation that is irritating him.  Remains on Tegretol 150 mg 3 times a day.  The Risperdal is 1 mg nightly.  He is on Lexapro 10 mg nightly for depression.  The patient feels that his  mood is poor. The patient has not had his PEG removed at this point as well.  The patient's blood pressure has been under control, and he is only on  Lopressor now currently 50 mg daily for blood pressure control.   REVIEW OF SYSTEMS:  The patient reports tremor, particularly right hand and  leg at nighttime.  He is still having decreased sleep.  He reports some  trouble walking and decreased balance.  He as occasional limb swelling in  right hand.   SOCIAL HISTORY:  Pertinent positives listed above.  The patient does not  drink but is still smoking.   PHYSICAL EXAMINATION:  VITAL SIGNS:  Blood pressure 122/70, pulse 67,  respiratory rate 16, saturating 97% on room air.  GENERAL:  The patient is pleasant, alert, and oriented x3 with cues. Seems  to be aware of situations but lacks some attention and focus.  He is almost  comical and laughing frequently.  Gait was stable except when heal-to-toe  ambulation was attempted.  Coordination was fair.  Reflexes 2+.  Sensation  grossly intact.  Trace edema right hand and leg.  HEART:  Regular rate and rhythm.  LUNGS:  Clear.  NEUROLOGIC:  The patient was able to recall some general ideas about current  events  but nothing specific.  His memory for information after 5 minutes was  2/3.  He was fair with sequencing a simple task.  Strength was 4 to 4+/5 on  the right and 5/5 on the left. Coordination was a bit diminished on the  right side.   ASSESSMENT AND PLAN:  1.  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  2.  Emotional dyscontrol.  3.  Hypertension.  4.  Tobacco use.  5.  Insomnia.   PLAN:  1.  Will reinitiate a.m. Risperdal 1 mg to give him 1 mg twice daily      currently.  2.  Will increase Lexapro 20 mg nightly.  3.  Continue Ativan for severe agitation, although this seems to be somewhat      improved.  4.  Continue Tegretol 150 mg 3 times a day.  5  Will make an appointment with the sleep lab for a sleep apnea study.  1.  I refilled Lopressor today 50 mg daily.  2.  We will schedule the patient back for a followup in about a month, and      we can also take his PEG out at that time as well.      Ranelle Oyster, M.D.  Electronically Signed     ZTS/MedQ  D:  04/27/2005 10:23:07  T:  04/27/2005 19:47:55  Job #:  [id number]
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
## 2010-10-31 NOTE — Group Therapy Note (Signed)
[name], [name]           ACCOUNT NO.:  [id number]   MEDICAL RECORD NO.:  [id number]          PATIENT TYPE:  OBV   LOCATION:  A301                          FACILITY:  [facility]   PHYSICIAN:  Margaretmary Dys, M.D.DATE OF BIRTH:  1956/06/12   DATE OF PROCEDURE:  12/12/2005  DATE OF DISCHARGE:                                   PROGRESS NOTE   SUBJECTIVE:  The patient has some headaches, although says this is slightly  improved.  He denies any nausea or vomiting.  No visual abnormalities.  The  patient is seen by the neurologist.  His appetite is good.  He has no fever  or chills.  I did discuss with his wife who continues to be concerned about  these intractable headaches.  A MRI of his brain was also pending at the  time I saw him.   OBJECTIVE:  GENERAL:  Conscious, alert, comfortable not in acute distress.  VITAL SIGNS:  Blood pressure 164/96, pulse of 75, respirations 20.  Temperature was 98 degrees Fahrenheit.  Oxygen saturation was 94% on room  air.  HEENT:  Normocephalic and atraumatic.  Oral mucosa was moist with no  exudates.  NECK:  Supple.  No JVD.  No lymphadenopathy.  LUNGS:  Clear clinically with good air entry bilaterally.  HEART:  S1 and S2 regular.  No S3, S4, gallops or rubs.  ABDOMEN:  Soft and nontender.  Bowel sounds positive.  No masses palpable.  EXTREMITIES:  No pitting or pedal edema.  No calf induration or tenderness  was noted.   LABORATORY/DIAGNOSTIC DATA:  None from today.  ESR was noted to be 26.  Vitamin B12 536.  MRI of the brain is pending.   ASSESSMENT/PLAN:  1.  Intractable headaches of unknown etiology:  The patient is awaiting      review by neurologist.  The patient does have hypertension which is      poorly controlled.  He is currently only on Lopressor.  I think it is      reasonable to add a diuretic to this, suggest hydrochlorothiazide.  We      may also increase his Lopressor as he is only once a day.  2.  Seizure disorder:  The  patient is stable.  No evidence of seizures.      Neurology does not think the patient is having seizure disorders.   DISPOSITION:  I think we need to further evaluate this headache.  We will  better control this blood pressure and we will await his official report of  MRI of his brain.      Margaretmary Dys, M.D.  Electronically Signed    AM/MEDQ  D:  12/12/2005  T:  12/12/2005  Job:  [id number]

## 2010-10-31 NOTE — Group Therapy Note (Signed)
[name], [name]           ACCOUNT NO.:  [id number]   MEDICAL RECORD NO.:  [id number]          PATIENT TYPE:  INP   LOCATION:  A217                          FACILITY:  [facility]   PHYSICIAN:  Margaretmary Dys, M.D.DATE OF BIRTH:  March 23, 1956   DATE OF PROCEDURE:  11/16/2005  DATE OF DISCHARGE:                                   PROGRESS NOTE   SUBJECTIVE:  Patient feels much better today.  He is actually wanting to go  home.  He denies any significant shortness of breath.  He has no cough.  He  denies any fever or chills.  I spoke with the wife in detail, and it appears  that the patient may have had a seizure because he also had some urinary  incontinence.  Wife denies that he was drinking.  Patient is an alcoholic  who drank frequently until about a month ago.  He has had no seizures in the  hospital.  He remains otherwise stable with no significant oxygen  requirements.   OBJECTIVE:  GENERAL:  Conscious, alert, comfortable, not in acute distress,  pleasant.  VITAL SIGNS:  Blood pressure is 148/83, pulse of 73, respirations 20,  temperature 97.7, oxygen saturation of 99% on 2 L.  HEENT:  Normocephalic, atraumatic.  Oral mucosa was moist with no exudates.  NECK:  Supple.  No JVD.  No lymphadenopathy.  LUNGS:  Reduced air entry bilaterally with no wheezes, rhonchi or crackles  heard.  HEART:  S1 and S2 are regular.  No S3 or S4, gallops or rubs.  ABDOMEN:  Soft, nontender.  Bowel sounds positive.  EXTREMITIES:  No edema.   LABORATORY DATA:  White blood cell count was 8600, hemoglobin of 12.0,  hematocrit of 36.1, platelet count of 284,000.  Sodium is 138, potassium  3.8, chloride of 105, CO2 26, BUN of 13.0, creatinine of 0.9, calcium of  9.1.  Blood cultures remained negative.   ASSESSMENT AND PLAN:  [name] is a 55 year old Caucasian male who was  brought to the emergency room yesterday after what appeared to be a choking  episode.  The patient's wife said it was  probably because he had too much  food, but it appeared that the patient had a fixed stare on his face, and  also had some urinary incontinence, highly suspicious that he had a seizure.  Patient had developed a seizure post head trauma a few years ago.  Patient  likely has more aspiration pneumonitis than pneumonia.  He has not had a  fever, and his white count is down in less than 24 hours.  I think it is  reasonable to observe him for another 24 hours at this time.  The patient  continues to do fairly well, has no fever with continued improvement in his  white count.  I think I will discharge him home tomorrow to complete 7 days  of oral antibiotics.  Overall, patient is stable, and has had no more  seizures here.  We will check a serum Tegretol level on him.     Margaretmary Dys, M.D.  Electronically Signed    AM/MEDQ  D:  11/16/2005  T:  11/16/2005  Job:  [id number]

## 2010-10-31 NOTE — Discharge Summary (Signed)
NAMEGIUSEPPE, [name]           ACCOUNT NO.:  [id number]   MEDICAL RECORD NO.:  [id number]          PATIENT TYPE:  OBV   LOCATION:  A301                          FACILITY:  [facility]   PHYSICIAN:  Renato Battles, M.D.     DATE OF BIRTH:  1956-01-02   DATE OF ADMISSION:  12/10/2005  DATE OF DISCHARGE:  LH                                 DISCHARGE SUMMARY   DISCHARGE DIAGNOSES:  1.  Acute-on-chronic severe headache, ruled out for any structural      abnormality and bleeding.  2.  Hypertension.  3.  Seizure disorder.  4.  Heavy tobacco abuse.  5.  Gastroesophageal reflux disease.  6.  Anxiety and depression.  7.  Hypertension.   DISCHARGE MEDICATIONS:  1.  Tegretol 150 mg p.o. b.i.d.  2.  Risperdal 1 mg p.o. b.i.d.  3.  Trazodone 150 mg p.o. q.h.s.  4.  Ativan 0.5 mg p.o. b.i.d.  5.  Lopressor 50 mg extended release p.o. daily.  6.  Aspirin 81 mg p.o. daily.  7.  AcipHex 20 mg p.o. daily.  8.  Cymbalta 60 mg p.o. daily.   Basically, these are his home medications without changes.   CONSULTS:  None.   PROCEDURES:  The patient had:  1.  Head CT scan, date December 10, 2005, showed atrophy with small-vessel      chronic ischemic changes with deep cerebral white matter old infarcts      but no acute changes.  2.  Head MRI with MRA June 29 showed significant age-advanced atrophy and      white matter changes suggestive of chronic microvascular ischemic      disease, remote lacunar infarctions, no acute abnormality, wallerian      degeneration, attenuated distal vasculature in the MCA and PCA branches.   HISTORY AND PHYSICAL AND HOSPITAL COURSE:  The patient is a pleasant 55-year-  old white male who is a very heavy smoker and suffers from chronic headaches  that has been vaguely diagnosed as migraine according to the patient.  Presented to the emergency department complaining of severe headache.  Initial thought processing including possibility of subarachnoid hemorrhage  versus  worsening of the chronic headache.  Was admitted for observation.  Neurologic exam was negative.  Imaging studies were all negative.  The  patient's headache continued over 2 or 3 days.  However, on day of discharge  the patient's headache resolved and he was back to his baseline, ready to go  home.   DISCHARGE ACTIVITY:  As tolerated.   DISCHARGE DIET:  Heart healthy.   DISCHARGE INSTRUCTIONS:  The patient was instructed on the importance of  quitting smoking.  He is to follow up with his primary care physician, Dr.  Lysbeth Galas.      Renato Battles, M.D.  Electronically Signed     SA/MEDQ  D:  12/14/2005  T:  12/14/2005  Job:  [id number]
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
## 2010-10-31 NOTE — Group Therapy Note (Signed)
[name], [name]           ACCOUNT NO.:  [id number]   MEDICAL RECORD NO.:  [id number]          PATIENT TYPE:  INP   LOCATION:  A301                          FACILITY:  [facility]   PHYSICIAN:  Osvaldo Shipper, MD     DATE OF BIRTH:  Nov 06, 1955   DATE OF PROCEDURE:  12/11/2005  DATE OF DISCHARGE:                                   PROGRESS NOTE   SUBJECTIVE:  Patient is somewhat a very poor historian.  He is having  episodes where he suddenly would stop talking to you and rub his face and  then would suddenly get back and listen to you again.  He states his  headaches still persist, again, located in the central part of his head.  He  does mention they are somewhat improved from yesterday but he does not feel  a whole lot better.  Denies any nausea, vomiting.  Has been tolerating p.o.  intake quite well.  No vision difficulties today so far.   OBJECTIVE:  VITAL SIGNS:  His vital signs reveal he is afebrile.  Really his  heart rate is stable at 60s.  The blood pressure is stable.  Respiratory  rate is stable as well.  HEENT:  There is no pallor, no icterus.  Oral mucous membrane is moist.  No  oral lesions are noted.  No sinus tenderness appreciated.  LUNGS:  There is some rhonchi appreciated bilaterally, but no crackles.  CARDIOVASCULAR:  S1, S2 is normal, regular.  No murmurs appreciated.  ABDOMEN:  Soft, nontender, nondistended.  Bowel sounds are present.  No  mass, organomegaly appreciated.  EXTREMITIES:  Extremities are without edema.  Peripheral pulses are  palpable.  NEUROLOGIC:  Patient is alert, oriented but sometimes he has these spells  where he suddenly loses concentration.  His strength is equal bilaterally.  No focal neurological deficits otherwise appreciated.   LABORATORY DATA:  No laboratories available from today.  Laboratories from  yesterday were not quite significant.   IMAGING STUDIES:  He did have a Head CT scan done yesterday which has been  reported as  being unremarkable for any acute event.  He also had a chest x-  ray which showed chronic bronchitic changes.   ASSESSMENT/PLAN:  1.  Severe headaches.  Etiology is unclear at this time.  They are not very      typical for migraine type since they are located in the central part of      the head.  There is a history in this patient of brain aneurysm which is      very concerning.  There is no MRI or MRA that has been done recently.      Only MRI brain was done June of 2006.  Given this history I think the      patient warrants at least another MRI and MRA to rule out aneurysmal      problems accounting for his headache.  I will also obtain a neurological      consult on this patient.  2.  History of seizure disorder, posttraumatic, stable.  His spells that he  has are somewhat concerning.  It could be that the patient is having      absence seizures.  Neurology can help [place] with that as well.  3.  Recent history of aspiration pneumonia, stable.  He is on a dysphagia II      diet.  4.  Other history including anxiety, depression, history of coronary artery      disease all stable.  He has history of alcohol abuse but he mentions      that he has not had any alcohol in at least the past one year and he is      quite emphatic about that.  Does smoke about a pack of cigarettes per      day.   DISPOSITION:  I think once the MRI/MRA is unremarkable and if neurology does  not have any further input I think this patient can possibly go home in the  next day.      Osvaldo Shipper, MD  Electronically Signed     GK/MEDQ  D:  12/11/2005  T:  12/11/2005  Job:  [id number]   cc:   Darleen Crocker A. Gerilyn Pilgrim, M.D.  Fax: [phone]

## 2010-10-31 NOTE — H&P (Signed)
[name], [name]           ACCOUNT NO.:  [id number]   MEDICAL RECORD NO.:  [id number]          PATIENT TYPE:  INP   LOCATION:  A301                          FACILITY:  [facility]   PHYSICIAN:  Lonia Blood, M.D.      DATE OF BIRTH:  [birth year]   DATE OF ADMISSION:  12/10/2005  DATE OF DISCHARGE:  LH                                HISTORY & PHYSICAL   PRIMARY CARE PHYSICIAN:  Delaney Meigs, M.D., so patient is unassigned  to [place].   PRESENTING COMPLAINT:  Severe headache.   HISTORY OF PRESENT ILLNESS:  Patient is a 55 year old white male with a  history of seizure disorder, who was just discharged from the hospital on  December 02, 2005 after admission with aspiration pneumonia.  The patient  reported to the ED this afternoon with severe headache lasting two days.  The patient described the onset as very acute.  He has had chronic headaches  off and on, but this is more severe than the rest.  It is mainly located in  the central part of his head and the left lateral part of the head.  Also in  the occipital region.  It was most severe in the beginning.  Associated with  some blurring of vision.  It is graded as a 6/10.  He denied any  diaphoresis.  He has had prior trauma and previous CVAs.  His pain has  currently improved and is graded as 4/10.  Initial treatment in the ED did  not result in resolution of his headache.  Hence, patient came to the  emergency room.   PAST MEDICAL HISTORY:  Significant for seizure disorder secondary to trauma,  history of dysphagia with recent aspiration pneumonia.  He is currently on a  D2 diet.  Anxiety.  Depression.  History of coronary artery disease.  Hypertension.  History of brain aneurysm.  History of alcohol abuse.  Gastroesophageal reflux disease.   ALLERGIES:  Mainly to CATAPRES.   SOCIAL HISTORY:  Patient is married.  He still smokes tobacco up to a half  to one pack per day.  Also, alcohol off and on.   FAMILY HISTORY:  No family  history of any brain tumors or metastasis.  No  other significant family history.   REVIEW OF SYSTEMS:  A 10-point review of systems is performed.  It is mainly  per HPI.   MEDICATIONS:  1.  Tegretol 150 mg t.i.d.  2.  Risperdal 1 mg b.i.d.  3.  Trazodone 150 mg q.h.s.  4.  Ativan 0.5 mg b.i.d.  5.  Lopressor 50 mg daily, extended release.  6.  Aspirin 81 mg daily.  7.  AcipHex 20 mg daily.  8.  Cymbalta 60 mg daily.   PHYSICAL EXAMINATION:  VITAL SIGNS:  Patient is afebrile with a temperature  of 97, blood pressure 136/84, pulse 62, respiratory rate 18, sats 96% on  room air.  GENERAL:  He is awake, alert and oriented.  No visible distress at this  point.  HEENT:  Pupils are equal and reactive to light.  EOMI.  NECK:  Supple.  No JVD.  No limitation of movement.  Mild tenderness over  the muscles in the neck, otherwise no obvious neck-specific pain.  RESPIRATORY:  Patient has good air entry bilaterally.  No wheezes or rales.  CARDIOVASCULAR:  He has a regular rate and rhythm.  ABDOMEN:  Soft, nontender with positive bowel sounds.  EXTREMITIES:  No clubbing, cyanosis or edema.  NEUROLOGIC:  Cranial nerves II-XII are essentially intact.  No focal  weakness.  Reflexes are 2+ bilaterally.  Motor function is 5/5, upper and  lower extremities, respectively.   His labs showed a white count of 7.8, hemoglobin 12.4 with an MCV of 92,  platelet count 266.  Initial cardiac enzymes are negative.  Sodium 134,  potassium 3.8, chloride 104, CO2 25, glucose 113.  BUN 10, creatinine 0.7,  calcium 9.2.  His LFTs are essentially normal.   Chest x-ray shows mild chronic bronchitic changes but no acute  abnormalities.   Head CT without contrast showed atrophy with small vessel chronic ischemic  changes of deep cerebellar white matter with old infarcts but no acute  intracranial abnormalities.   ASSESSMENT:  This is a 55 year old gentleman with a history of chronic  headaches, presenting with  severe headache.  The differential includes a  subarachnoid hemorrhage as the severest form but also could be migraine  tension-like headaches or cluster headaches.  The fact that the patient has  had a chronic headache with a negative CT at this point, rule out other  findings like cerebrovascular accident as well.  I suspect this is one of  the chronic headaches that just got severe today.  Will admit the patient  for 23-hour observation.  Follow his example neurologically, frequent neuro  checks.  Pain control essentially.  If his symptoms persist or he develops  any focal weakness, then we will consider other imaging, including an MRI of  the brain.  Also, his C-spine, if needed.  Otherwise, will persist with pain  control and hopefully discharge him home in the morning with pain  medications.  His other medical problems seem to be stable.  Will resume all  of his home medications at this point.      Lonia Blood, M.D.  Electronically Signed     LG/MEDQ  D:  12/10/2005  T:  12/10/2005  Job:  [id number]
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
## 2010-10-31 NOTE — Procedures (Signed)
[name], [name]           ACCOUNT NO.:  [id number]   MEDICAL RECORD NO.:  [id number]          PATIENT TYPE:  REC   LOCATION:  RAD                           FACILITY:  [facility]   PHYSICIAN:  Thomas C. Wall, M.D.   DATE OF BIRTH:  11-28-1955   DATE OF PROCEDURE:  01/05/2005  DATE OF DISCHARGE:                                    STRESS TEST   INDICATIONS FOR TEST:  [name] is a 55 year old gentleman with no  known coronary disease with atypical chest discomfort.  His cardiac risk  factors include hypertension, tobacco abuse and unknown lipid status.   BASELINE DATA:  Electrocardiogram reveals sinus rhythm at 66 beats per  minute, nonspecific ST abnormalities, left ventricular hypertrophy.  Blood  pressure is 142/78.   The patient had 33 mg of adenosine was infused over 4-minute protocol with  Myoview injected at 3 minutes.   The patient reported no chest discomfort or shortness of breath.  He denied  any symptoms at all.   Electrocardiogram revealed no arrhythmias.  No ischemic changes were noted.   Final images and results are pending M.D. review.       AB/MEDQ  D:  01/05/2005  T:  01/05/2005  Job:  [id number]

## 2011-03-13 LAB — DIFFERENTIAL
Basophils Absolute: 0
Basophils Absolute: 0
Basophils Relative: 0
Basophils Relative: 0
Eosinophils Absolute: 0
Eosinophils Relative: 0
Eosinophils Relative: 1
Eosinophils Relative: 1
Lymphocytes Relative: 7 — ABNORMAL LOW
Lymphs Abs: 2.4
Monocytes Absolute: 0.4
Monocytes Absolute: 0.6
Monocytes Relative: 3
Monocytes Relative: 7
Neutrophils Relative %: 65
Neutrophils Relative %: 90 — ABNORMAL HIGH

## 2011-03-13 LAB — BASIC METABOLIC PANEL
BUN: 4 — ABNORMAL LOW
CO2: 26
Calcium: 8.9
Calcium: 9.1
Calcium: 9.1
Chloride: 103
Chloride: 105
Creatinine, Ser: 0.75
Creatinine, Ser: 0.89
GFR calc Af Amer: >60
GFR calc Af Amer: >60
GFR calc non Af Amer: >60
Glucose, Bld: 117 — ABNORMAL HIGH
Potassium: 3.4 — ABNORMAL LOW
Potassium: 3.8
Potassium: 3.9
Sodium: 138
Sodium: 138

## 2011-03-13 LAB — CBC
HCT: 35.2 — ABNORMAL LOW
HCT: 36.8 — ABNORMAL LOW
Hemoglobin: 11.8 — ABNORMAL LOW
MCHC: 33.6
Platelets: 236
Platelets: 238
RBC: 3.59 — ABNORMAL LOW
RDW: 13.4
WBC: 12.4 — ABNORMAL HIGH
WBC: 9.1

## 2011-03-13 LAB — POCT CARDIAC MARKERS
CKMB, poc: <1 — ABNORMAL LOW
CKMB, poc: <1 — ABNORMAL LOW
Myoglobin, poc: 63.6
Myoglobin, poc: 64.9
Operator id: 179121
Operator id: 282261
Troponin i, poc: 0.05
Troponin i, poc: <0.05

## 2011-03-13 LAB — BLOOD GAS, ARTERIAL
Acid-base deficit: 0.3
Patient temperature: 37
pCO2 arterial: 34.6 — ABNORMAL LOW

## 2011-03-25 LAB — CARDIAC PANEL(CRET KIN+CKTOT+MB+TROPI)
Relative Index: 0.9
Relative Index: 1
Total CK: 234 — ABNORMAL HIGH
Troponin I: 0.01
Troponin I: 0.01

## 2011-03-25 LAB — LIPID PANEL
Cholesterol: 138
HDL: 39 — ABNORMAL LOW
Triglycerides: 121

## 2011-03-25 LAB — CK TOTAL AND CKMB (NOT AT ARMC): Total CK: 149

## 2011-03-25 LAB — DIFFERENTIAL
Basophils Absolute: 0
Eosinophils Relative: 1
Lymphocytes Relative: 30
Lymphs Abs: 3
Monocytes Absolute: 0.5
Monocytes Relative: 5
Neutro Abs: 6.5

## 2011-03-25 LAB — CBC
HCT: 38.2 — ABNORMAL LOW
Hemoglobin: 12.8 — ABNORMAL LOW
MCHC: 33.6
MCV: 93.6
Platelets: 245
RDW: 13.3

## 2011-03-25 LAB — TROPONIN I: Troponin I: <0.01

## 2011-03-25 LAB — COMPREHENSIVE METABOLIC PANEL
Chloride: 103
GFR calc non Af Amer: >60
Potassium: 4.4

## 2011-03-25 LAB — APTT: aPTT: 25

## 2012-09-13 ENCOUNTER — Other Ambulatory Visit: Payer: Self-pay | Admitting: Family Medicine

## 2012-09-13 DIAGNOSIS — R569 Unspecified convulsions: Secondary | ICD-10-CM

## 2012-09-13 NOTE — Telephone Encounter (Signed)
Last seen 02/16/12

## 2012-09-14 NOTE — Telephone Encounter (Signed)
No record of metoprolol Rx from [facility] or on record

## 2012-09-15 ENCOUNTER — Other Ambulatory Visit: Payer: Self-pay | Admitting: *Deleted

## 2012-09-15 MED ORDER — METOPROLOL SUCCINATE ER 25 MG PO TB24: 25.0000 mg | ORAL_TABLET | Freq: Every day | ORAL | Status: DC

## 2012-10-06 ENCOUNTER — Ambulatory Visit: Payer: Self-pay | Admitting: Family Medicine

## 2012-10-11 ENCOUNTER — Ambulatory Visit: Payer: Medicaid Other | Admitting: Family Medicine

## 2012-10-13 ENCOUNTER — Other Ambulatory Visit: Payer: Self-pay | Admitting: Family Medicine

## 2012-10-14 ENCOUNTER — Other Ambulatory Visit: Payer: Self-pay | Admitting: *Deleted

## 2012-10-14 MED ORDER — ALPRAZOLAM 1 MG PO TABS: 1.0000 mg | ORAL_TABLET | Freq: Three times a day (TID) | ORAL | Status: DC

## 2012-10-14 NOTE — Telephone Encounter (Signed)
Last filled 07/18/12 with 2 RF, has appt 11/04/12 with FPW. Call into [facility]

## 2012-11-04 ENCOUNTER — Ambulatory Visit (INDEPENDENT_AMBULATORY_CARE_PROVIDER_SITE_OTHER): Payer: Medicaid Other | Admitting: Family Medicine

## 2012-11-04 ENCOUNTER — Encounter: Payer: Self-pay | Admitting: Family Medicine

## 2012-11-04 VITALS — BP 126/79 | HR 83 | Temp 97.1°F | Ht 66.0 in | Wt 172.0 lb

## 2012-11-04 DIAGNOSIS — G2581 Restless legs syndrome: Secondary | ICD-10-CM | POA: Insufficient documentation

## 2012-11-04 DIAGNOSIS — R350 Frequency of micturition: Secondary | ICD-10-CM

## 2012-11-04 DIAGNOSIS — I2510 Atherosclerotic heart disease of native coronary artery without angina pectoris: Secondary | ICD-10-CM

## 2012-11-04 DIAGNOSIS — R569 Unspecified convulsions: Secondary | ICD-10-CM | POA: Insufficient documentation

## 2012-11-04 DIAGNOSIS — E785 Hyperlipidemia, unspecified: Secondary | ICD-10-CM | POA: Insufficient documentation

## 2012-11-04 DIAGNOSIS — G40909 Epilepsy, unspecified, not intractable, without status epilepticus: Secondary | ICD-10-CM

## 2012-11-04 LAB — POCT UA - MICROSCOPIC ONLY
Bacteria, U Microscopic: NEGATIVE
Casts, Ur, LPF, POC: NEGATIVE
Crystals, Ur, HPF, POC: NEGATIVE
WBC, Ur, HPF, POC: NEGATIVE
Yeast, UA: NEGATIVE

## 2012-11-04 LAB — COMPLETE METABOLIC PANEL WITH GFR
ALT: 15 U/L (ref 0–53)
AST: 17 U/L (ref 0–37)
Albumin: 4.1 g/dL (ref 3.5–5.2)
Alkaline Phosphatase: 67 U/L (ref 39–117)
BUN: 9 mg/dL (ref 6–23)
CO2: 25 mEq/L (ref 19–32)
Calcium: 9.4 mg/dL (ref 8.4–10.5)
Chloride: 97 mEq/L (ref 96–112)
Creat: 0.8 mg/dL (ref 0.50–1.35)
GFR, Est African American: >89 mL/min
GFR, Est Non African American: >89 mL/min
Glucose, Bld: 102 mg/dL — ABNORMAL HIGH (ref 70–99)
Potassium: 4.1 mEq/L (ref 3.5–5.3)
Sodium: 132 mEq/L — ABNORMAL LOW (ref 135–145)
Total Bilirubin: 0.4 mg/dL (ref 0.3–1.2)
Total Protein: 6.9 g/dL (ref 6.0–8.3)

## 2012-11-04 LAB — POCT URINALYSIS DIPSTICK
Bilirubin, UA: NEGATIVE
Glucose, UA: NEGATIVE
Ketones, UA: NEGATIVE
Leukocytes, UA: NEGATIVE
Nitrite, UA: NEGATIVE
Protein, UA: NEGATIVE
Spec Grav, UA: 1.015
Urobilinogen, UA: NEGATIVE
pH, UA: 6.5

## 2012-11-04 MED ORDER — DULOXETINE HCL 60 MG PO CPEP: 60.0000 mg | ORAL_CAPSULE | Freq: Every day | ORAL | Status: DC

## 2012-11-04 MED ORDER — CARBAMAZEPINE 100 MG PO CHEW: CHEWABLE_TABLET | ORAL | Status: DC

## 2012-11-04 MED ORDER — RISPERIDONE 1 MG PO TABS: 1.0000 mg | ORAL_TABLET | Freq: Two times a day (BID) | ORAL | Status: DC

## 2012-11-04 MED ORDER — SIMVASTATIN 40 MG PO TABS: 40.0000 mg | ORAL_TABLET | Freq: Every evening | ORAL | Status: DC

## 2012-11-04 MED ORDER — PRAMIPEXOLE DIHYDROCHLORIDE 0.25 MG PO TABS: ORAL_TABLET | ORAL | Status: DC

## 2012-11-04 MED ORDER — SOLIFENACIN SUCCINATE 5 MG PO TABS: 10.0000 mg | ORAL_TABLET | Freq: Every day | ORAL | Status: DC

## 2012-11-04 MED ORDER — NIACIN ER (ANTIHYPERLIPIDEMIC) 1000 MG PO TBCR: 1000.0000 mg | EXTENDED_RELEASE_TABLET | Freq: Every day | ORAL | Status: DC

## 2012-11-04 MED ORDER — METOPROLOL SUCCINATE ER 25 MG PO TB24: 25.0000 mg | ORAL_TABLET | Freq: Every day | ORAL | Status: DC

## 2012-11-04 NOTE — Progress Notes (Signed)
Patient ID: [name], male   DOB: 1956/02/04, 57 y.o.   MRN: [id number] SUBJECTIVE: HPI: Known s/p cerebral aneurysm, with brain injury and seizure disorder. His medical problems are stable except for his polydipsia and polyuria. He was drinking many sodas and the family has restricted this but he now drinks a lot of coffee and urinates frequently. His daughter wonders if it has to do with his prostate however the family doesnot want any surgical intervention. Here to recheck his meds as well.  PMH/PSH: reviewed/updated in Epic  SH/FH: reviewed/updated in Epic  Allergies: reviewed/updated in Epic  Medications: reviewed/updated in Epic  Immunizations: reviewed/updated in Epic  ROS: As above in the HPI. All other systems are stable or negative.  OBJECTIVE: APPEARANCE:  Patient in no acute distress.The patient appeared well nourished and normally developed. Acyanotic. Waist: VITAL SIGNS:BP 126/79  Pulse 83  Temp(Src) 97.1 F (36.2 C) (Oral)  Ht 5\' 6"  (1.676 m)  Wt 172 lb (78.019 kg)  BMI 27.77 kg/m2  Pleasant white male.  SKIN: warm and  Dry without overt rashes, tattoos and scars  HEAD and Neck: without JVD, Head and scalp: normal Eyes:No scleral icterus. Fundi normal, eye movements normal. Ears: Auricle normal, canal normal, Tympanic membranes normal, insufflation normal. Nose: normal Throat: normal Neck & thyroid: normal  CHEST & LUNGS: Chest wall: normal Lungs: Clear  CVS: Reveals the PMI to be normally located. Regular rhythm, First and Second Heart sounds are normal,  absence of murmurs, rubs or gallops. Peripheral vasculature: Radial pulses: normal Dorsal pedis pulses: normal Posterior pulses: normal  ABDOMEN:  Appearance: normal Benign,, no organomegaly, no masses, no Abdominal Aortic enlargement. No Guarding , no rebound. No Bruits. Bowel sounds: normal  RECTAL: N/A GU: N/A  EXTREMETIES: nonedematous.  MUSCULOSKELETAL:  Spine:  normal Joints: intact  NEUROLOGIC: oriented to  person; no change  ASSESSMENT: Frequent urination - Plan: POCT urinalysis dipstick, POCT UA - Microscopic Only, solifenacin (VESICARE) 5 MG tablet, Urine culture  Seizure disorder - Plan: Carbamazepine level, total, carbamazepine (TEGRETOL) 100 MG chewable tablet, DULoxetine (CYMBALTA) 60 MG capsule, risperiDONE (RISPERDAL) 1 MG tablet  Seizures  RLS (restless legs syndrome) - Plan: pramipexole (MIRAPEX) 0.25 MG tablet  HLD (hyperlipidemia) - Plan: DULoxetine (CYMBALTA) 60 MG capsule, niacin (NIASPAN) 1000 MG CR tablet, simvastatin (ZOCOR) 40 MG tablet, COMPLETE METABOLIC PANEL WITH GFR  CAD (coronary artery disease) - Plan: metoprolol succinate (TOPROL-XL) 25 MG 24 hr tablet, niacin (NIASPAN) 1000 MG CR tablet, simvastatin (ZOCOR) 40 MG tablet   PLAN:  Orders Placed This Encounter  Procedures  . Urine culture  . Carbamazepine level, total  . COMPLETE METABOLIC PANEL WITH GFR  . POCT urinalysis dipstick  . POCT UA - Microscopic Only   Results for orders placed in visit on 11/04/12  POCT URINALYSIS DIPSTICK      Result Value Range   Color, UA yellow     Clarity, UA clear     Glucose, UA neg     Bilirubin, UA neg     Ketones, UA neg     Spec Grav, UA 1.015     Blood, UA trace     pH, UA 6.5     Protein, UA neg     Urobilinogen, UA negative     Nitrite, UA neg     Leukocytes, UA Negative    POCT UA - MICROSCOPIC ONLY      Result Value Range   WBC, Ur, HPF, POC neg  RBC, urine, microscopic 1-3     Bacteria, U Microscopic neg     Mucus, UA trace     Epithelial cells, urine per micros occ     Crystals, Ur, HPF, POC neg     Casts, Ur, LPF, POC neg     Yeast, UA neg     Meds ordered this encounter  Medications  . DISCONTD: niacin (NIASPAN) 1000 MG CR tablet    Sig: Take 1,000 mg by mouth at bedtime.  Marland Kitchen DISCONTD: DULoxetine (CYMBALTA) 60 MG capsule    Sig: Take 60 mg by mouth daily.  Marland Kitchen DISCONTD: risperiDONE  (RISPERDAL) 1 MG tablet    Sig: Take 1 mg by mouth 2 (two) times daily.  Marland Kitchen DISCONTD: solifenacin (VESICARE) 5 MG tablet    Sig: Take 10 mg by mouth daily.  Marland Kitchen DISCONTD: simvastatin (ZOCOR) 40 MG tablet    Sig: Take 40 mg by mouth every evening.  . pramipexole (MIRAPEX) 0.25 MG tablet    Sig: TAKE ONE TABLET AT BEDTIME    Dispense:  90 tablet    Refill:  1  . metoprolol succinate (TOPROL-XL) 25 MG 24 hr tablet    Sig: Take 1 tablet (25 mg total) by mouth daily.    Dispense:  90 tablet    Refill:  1  . carbamazepine (TEGRETOL) 100 MG chewable tablet    Sig: TAKE 2 TABS IN THE MORNING, 1/2 TAB AT NOON, 2 TABS IN THE EVENING    Dispense:  450 tablet    Refill:  1  . DULoxetine (CYMBALTA) 60 MG capsule    Sig: Take 1 capsule (60 mg total) by mouth daily.    Dispense:  90 capsule    Refill:  1  . niacin (NIASPAN) 1000 MG CR tablet    Sig: Take 1 tablet (1,000 mg total) by mouth at bedtime.    Dispense:  90 tablet    Refill:  1  . risperiDONE (RISPERDAL) 1 MG tablet    Sig: Take 1 tablet (1 mg total) by mouth 2 (two) times daily.    Dispense:  90 tablet    Refill:  1  . simvastatin (ZOCOR) 40 MG tablet    Sig: Take 1 tablet (40 mg total) by mouth every evening.    Dispense:  90 tablet    Refill:  1  . solifenacin (VESICARE) 5 MG tablet    Sig: Take 2 tablets (10 mg total) by mouth daily.    Dispense:  90 tablet    Refill:  1   Discussed with his daughter that the patient needs to be fluid supervised and that due to his brain injury and meds for seizure diosrder he has polydipsia and polyuria. Restrict to 1.5 liter per day.  RTC 3 months  Neilan Rizzo P. Modesto Charon, M.D.

## 2012-11-05 LAB — URINE CULTURE
Colony Count: NO GROWTH
Organism ID, Bacteria: NO GROWTH

## 2012-11-05 LAB — CARBAMAZEPINE LEVEL, TOTAL: Carbamazepine Lvl: 8.3 ug/mL (ref 4.0–12.0)

## 2012-11-05 NOTE — Progress Notes (Signed)
Quick Note:  Lab result at goal. No change in Medications for now. No Change in plans and follow up. Need to restrict water/fluid intake to 1.5 liters per day. His meds and brain injury makes him drink a lot of water and dilute his serum, which is why his sodium is low. ______

## 2012-11-14 ENCOUNTER — Other Ambulatory Visit: Payer: Self-pay | Admitting: Family Medicine

## 2012-11-15 ENCOUNTER — Other Ambulatory Visit: Payer: Self-pay | Admitting: Family Medicine

## 2012-11-15 NOTE — Telephone Encounter (Signed)
Last seen 11/04/12   Last filled 10/14/12   Phone in and have nurse notify patient

## 2012-11-16 ENCOUNTER — Other Ambulatory Visit: Payer: Self-pay

## 2012-11-16 MED ORDER — ALPRAZOLAM 1 MG PO TABS: 1.0000 mg | ORAL_TABLET | Freq: Three times a day (TID) | ORAL | Status: DC

## 2012-11-16 NOTE — Telephone Encounter (Signed)
Med rf request is waiting on fpw- this is a dup message

## 2012-11-16 NOTE — Telephone Encounter (Signed)
Last filled 10/14/12  Last seen 11/04/12  If approved call in and have nurse notify patient

## 2012-11-18 ENCOUNTER — Telehealth: Payer: Self-pay | Admitting: Family Medicine

## 2012-11-18 NOTE — Telephone Encounter (Signed)
PT NOTIFIED RX CALLED IN

## 2012-11-22 ENCOUNTER — Telehealth: Payer: Self-pay | Admitting: Family Medicine

## 2012-11-22 NOTE — Telephone Encounter (Signed)
Spoke with [name] thinks he needs to be in hospital Urinating on himself at nightime  , tongue stays out all time  Rt leg the one he had stroke "jumping all time,   DR [facility] PLEASE ADVISE  I

## 2012-11-22 NOTE — Telephone Encounter (Signed)
Per Dr Modesto Charon -pt may be consuming to much fluid and to take him to hospital   Azzie notified and informed of above

## 2012-12-12 DIAGNOSIS — F3289 Other specified depressive episodes: Secondary | ICD-10-CM

## 2012-12-12 DIAGNOSIS — I609 Nontraumatic subarachnoid hemorrhage, unspecified: Secondary | ICD-10-CM

## 2012-12-12 DIAGNOSIS — F329 Major depressive disorder, single episode, unspecified: Secondary | ICD-10-CM

## 2012-12-13 ENCOUNTER — Other Ambulatory Visit: Payer: Self-pay | Admitting: *Deleted

## 2012-12-13 MED ORDER — ALPRAZOLAM 1 MG PO TABS: 1.0000 mg | ORAL_TABLET | Freq: Three times a day (TID) | ORAL | Status: DC

## 2012-12-13 NOTE — Telephone Encounter (Signed)
LAST RF 11/18/12. CALL IN [facility] IF APPROVED. LAST OV 5/14. THANKS.

## 2012-12-13 NOTE — Telephone Encounter (Signed)
Please call in rx for xanax 2 refills 

## 2012-12-14 NOTE — Telephone Encounter (Signed)
Done

## 2012-12-15 ENCOUNTER — Telehealth: Payer: Self-pay | Admitting: Family Medicine

## 2012-12-19 NOTE — Telephone Encounter (Signed)
Already done

## 2013-01-12 ENCOUNTER — Other Ambulatory Visit: Payer: Self-pay | Admitting: Family Medicine

## 2013-01-13 ENCOUNTER — Telehealth: Payer: Self-pay | Admitting: Family Medicine

## 2013-01-13 ENCOUNTER — Other Ambulatory Visit: Payer: Self-pay

## 2013-01-13 MED ORDER — ALPRAZOLAM 1 MG PO TABS: 1.0000 mg | ORAL_TABLET | Freq: Three times a day (TID) | ORAL | Status: DC

## 2013-01-13 NOTE — Telephone Encounter (Signed)
azzie notified that rx's sent electronically and the alprazolam was called in to [facility] pharmacy

## 2013-01-13 NOTE — Telephone Encounter (Signed)
Okay to refill? 

## 2013-01-13 NOTE — Telephone Encounter (Signed)
Last filled 12/13/12   MMM    If approved phone in and route to nurse

## 2013-01-13 NOTE — Telephone Encounter (Signed)
Last seen 11/04/12  Dr Modesto Charon  Last lipids 12/29/11

## 2013-01-16 ENCOUNTER — Other Ambulatory Visit: Payer: Self-pay | Admitting: Family Medicine

## 2013-01-27 ENCOUNTER — Encounter: Payer: Self-pay | Admitting: Family Medicine

## 2013-01-27 ENCOUNTER — Ambulatory Visit (INDEPENDENT_AMBULATORY_CARE_PROVIDER_SITE_OTHER): Payer: Medicaid Other | Admitting: Family Medicine

## 2013-01-27 VITALS — BP 130/83 | HR 71 | Temp 97.7°F | Wt 162.4 lb

## 2013-01-27 DIAGNOSIS — S0990XA Unspecified injury of head, initial encounter: Secondary | ICD-10-CM

## 2013-01-27 DIAGNOSIS — K219 Gastro-esophageal reflux disease without esophagitis: Secondary | ICD-10-CM

## 2013-01-27 DIAGNOSIS — G2581 Restless legs syndrome: Secondary | ICD-10-CM

## 2013-01-27 DIAGNOSIS — E785 Hyperlipidemia, unspecified: Secondary | ICD-10-CM

## 2013-01-27 DIAGNOSIS — I2510 Atherosclerotic heart disease of native coronary artery without angina pectoris: Secondary | ICD-10-CM

## 2013-01-27 DIAGNOSIS — Z0000 Encounter for general adult medical examination without abnormal findings: Secondary | ICD-10-CM

## 2013-01-27 DIAGNOSIS — R350 Frequency of micturition: Secondary | ICD-10-CM

## 2013-01-27 DIAGNOSIS — F329 Major depressive disorder, single episode, unspecified: Secondary | ICD-10-CM

## 2013-01-27 DIAGNOSIS — Z119 Encounter for screening for infectious and parasitic diseases, unspecified: Secondary | ICD-10-CM

## 2013-01-27 DIAGNOSIS — R569 Unspecified convulsions: Secondary | ICD-10-CM

## 2013-01-27 DIAGNOSIS — R7309 Other abnormal glucose: Secondary | ICD-10-CM

## 2013-01-27 DIAGNOSIS — I10 Essential (primary) hypertension: Secondary | ICD-10-CM

## 2013-01-27 DIAGNOSIS — J4489 Other specified chronic obstructive pulmonary disease: Secondary | ICD-10-CM

## 2013-01-27 DIAGNOSIS — F3289 Other specified depressive episodes: Secondary | ICD-10-CM

## 2013-01-27 DIAGNOSIS — J449 Chronic obstructive pulmonary disease, unspecified: Secondary | ICD-10-CM

## 2013-01-27 DIAGNOSIS — F411 Generalized anxiety disorder: Secondary | ICD-10-CM

## 2013-01-27 DIAGNOSIS — I671 Cerebral aneurysm, nonruptured: Secondary | ICD-10-CM

## 2013-01-27 DIAGNOSIS — R739 Hyperglycemia, unspecified: Secondary | ICD-10-CM

## 2013-01-27 DIAGNOSIS — F17200 Nicotine dependence, unspecified, uncomplicated: Secondary | ICD-10-CM

## 2013-01-27 DIAGNOSIS — G4733 Obstructive sleep apnea (adult) (pediatric): Secondary | ICD-10-CM

## 2013-01-27 LAB — POCT GLYCOSYLATED HEMOGLOBIN (HGB A1C): Hemoglobin A1C: 5.4

## 2013-01-27 MED ORDER — PRAMIPEXOLE DIHYDROCHLORIDE 0.5 MG PO TABS: ORAL_TABLET | ORAL | Status: DC

## 2013-01-27 NOTE — Progress Notes (Signed)
Patient ID: [name], male   DOB: [birth year], 57 y.o.   MRN: [id number] SUBJECTIVE: CC: Chief Complaint  Patient presents with  . Follow-up    ck up for medicaid and needs rx for depends and nebulizer    HPI: Annual physical Came to follow up on his medical problems as well.  Past Medical History  Diagnosis Date  . Anxiety   . Seizures   . Cerebral aneurysm   . Closed head injury   . Sleep apnea   . Depression   . GERD (gastroesophageal reflux disease)   . Hyperlipidemia   . Hypertension   . COPD (chronic obstructive pulmonary disease)    No past surgical history on file. History   Social History  . Marital Status: Single    Spouse Name: N/A    Number of Children: N/A  . Years of Education: N/A   Occupational History  . Not on file.   Social History Main Topics  . Smoking status: Current Every Day Smoker  . Smokeless tobacco: Not on file  . Alcohol Use: Not on file  . Drug Use: Not on file  . Sexual Activity: Not on file   Other Topics Concern  . Not on file   Social History Narrative  . No narrative on file   No family history on file. Current Outpatient Prescriptions on File Prior to Visit  Medication Sig Dispense Refill  . albuterol (PROVENTIL) (2.5 MG/3ML) 0.083% nebulizer solution USE 1 VIAL VIA NEBULIZER 4 TIMES A DAY AS NEEDED  375 mL  2  . ALPRAZolam (XANAX) 1 MG tablet Take 1 tablet (1 mg total) by mouth 3 (three) times daily.  90 tablet  0  . carbamazepine (TEGRETOL) 100 MG chewable tablet TAKE 2 TABS IN THE MORNING, 1/2 TAB AT NOON, 2 TABS IN THE EVENING  150 tablet  5  . DULoxetine (CYMBALTA) 60 MG capsule TAKE (1) CAPSULE DAILY  30 capsule  11  . NIASPAN 1000 MG CR tablet TAKE ONE TABLET AT BEDTIME  30 tablet  11  . risperiDONE (RISPERDAL) 1 MG tablet Take 1 tablet (1 mg total) by mouth 2 (two) times daily.  90 tablet  1  . simvastatin (ZOCOR) 40 MG tablet TAKE ONE TABLET AT BEDTIME  90 tablet  0  . TOPROL XL 25 MG 24 hr tablet TAKE 1  TABLET DAILY  30 tablet  11  . VESICARE 5 MG tablet TAKE (2) TABLETS DAILY  60 tablet  11   No current facility-administered medications on file prior to visit.   Allergies  Allergen Reactions  . Clonidine     REACTION: rash    There is no immunization history on file for this patient. Prior to Admission medications   Medication Sig Start Date End Date Taking? Authorizing Provider  albuterol (PROVENTIL) (2.5 MG/3ML) 0.083% nebulizer solution USE 1 VIAL VIA NEBULIZER 4 TIMES A DAY AS NEEDED 11/14/12  Yes Ileana Ladd, MD  ALPRAZolam Prudy Feeler) 1 MG tablet Take 1 tablet (1 mg total) by mouth 3 (three) times daily. 01/13/13  Yes Ileana Ladd, MD  carbamazepine (TEGRETOL) 100 MG chewable tablet TAKE 2 TABS IN THE MORNING, 1/2 TAB AT NOON, 2 TABS IN THE EVENING 01/12/13  Yes Ileana Ladd, MD  DULoxetine (CYMBALTA) 60 MG capsule TAKE (1) CAPSULE DAILY 01/12/13  Yes Ileana Ladd, MD  NIASPAN 1000 MG CR tablet TAKE ONE TABLET AT BEDTIME 01/12/13  Yes Ileana Ladd, MD  pramipexole (  MIRAPEX) 0.5 MG tablet TAKE ONE TABLET AT BEDTIME 01/27/13  Yes Ileana Ladd, MD  risperiDONE (RISPERDAL) 1 MG tablet Take 1 tablet (1 mg total) by mouth 2 (two) times daily. 11/04/12  Yes Ileana Ladd, MD  simvastatin (ZOCOR) 40 MG tablet TAKE ONE TABLET AT BEDTIME 01/16/13  Yes Ernestina Penna, MD  TOPROL XL 25 MG 24 hr tablet TAKE 1 TABLET DAILY 01/12/13  Yes Ileana Ladd, MD  VESICARE 5 MG tablet TAKE (2) TABLETS DAILY 01/12/13  Yes Ileana Ladd, MD     ROS: As above in the HPI. All other systems are stable or negative.  OBJECTIVE: APPEARANCE:  Patient in no acute distress.The patient appeared well nourished and normally developed. Acyanotic. Waist: VITAL SIGNS:BP 130/83  Pulse 71  Temp(Src) 97.7 F (36.5 C) (Oral)  Wt 162 lb 6.4 oz (73.664 kg)  BMI 26.22 kg/m2 WM Pleasant dysarthria  SKIN: warm and  Dry without overt rashes, tattoos and scars  HEAD and Neck: without JVD, Head and scalp:  normal Eyes:No scleral icterus. Fundi normal, eye movements normal. Ears: Auricle normal, canal normal, Tympanic membranes normal, insufflation normal. Nose: normal Throat: normal Neck & thyroid: normal  CHEST & LUNGS: Chest wall: normal Lungs: Clear  CVS: Reveals the PMI to be normally located. Regular rhythm, First and Second Heart sounds are normal,  absence of murmurs, rubs or gallops. Peripheral vasculature: Radial pulses: normal Dorsal pedis pulses: normal Posterior pulses: normal  ABDOMEN:  Appearance: normal Benign, no organomegaly, no masses, no Abdominal Aortic enlargement. No Guarding , no rebound. No Bruits. Bowel sounds: normal  RECTAL: N/A GU: N/A  EXTREMETIES: nonedematous.   NEUROLOGIC: oriented to time,place and person; nonfocal. Right hemiparesis secondary to stroke. . Right upper extremity affected the most.  ASSESSMENT: ANXIETY  C O P D  CEREBRAL ANEURYSM  CAD (coronary artery disease) - Plan: CMP14+EGFR  DEPRESSION  Frequent urination - Plan: Urine culture  GERD  HEAD TRAUMA, CLOSED  HYPERLIPIDEMIA - Plan: CMP14+EGFR, NMR, lipoprofile  HYPERTENSION  OBSTRUCTIVE SLEEP APNEA  RLS (restless legs syndrome)  SEIZURE DISORDER - Plan: CMP14+EGFR, Carbamazepine level, free, Vitamin B12, Folate  TOBACCO ABUSE  Hyperglycemia - Plan: POCT glycosylated hemoglobin (Hb A1C), CMP14+EGFR  Screening examination for infectious disease - Plan: Hepatitis C antibody    PLAN:        HEALTH MAINTENANCE Immunizations: Tetanus-Diphtheria Booster due:2021 Pertusis Booster due:2021 Flu Shot Due: every Fall Pneumonia Vaccine: usually at 59 years of age unless there are certain risk situations. Herpes Zoster/Shingles Vaccine due: usually at 59 years of age HPV ZOX:WRUE age 74 to 77 years in males and females.  Healthy Life Habits: Exercise Goal: 5-6 days/week; start gradually(ie 30 minutes/3days per week) Nutrition: Balanced healthy meals  including Vegetables and Fruits. Consider  Reading the following books: 1) Eat to Live by Dr Ottis Stain; 2) Prevent and Reverse Heart Disease by Dr Suzzette Righter.  Vitamins:okay Aspirin:as you are doing Stop Tobacco Use:++++ Seat Belt Use:+++ recommended Sunscreen Use:+++ recommended  Recommended Screening Tests: Colon Cancer Screening:porbably due next year. Please check with the GI who did it last. It was less than 10 years ago. Blood work: today Cholesterol Screening: today          HIV:  ?                  Hepatitis C(people born 1945-1965):today  Monthly Self Testicular Exam:+++  Eye Exam: every 1 to 2 years recommended Dental Health: at least every  6 months  Others:    Living Will/Healthcare Power of Attorney: should have this in order with your personal estate planning   Rx for depends small #1 Box RFx1 year Rx for neb unit and tubing# 30 Refill for 1 Year.  Orders Placed This Encounter  Procedures  . Urine culture  . CMP14+EGFR  . NMR, lipoprofile  . Carbamazepine level, free  . Vitamin B12  . Folate  . Hepatitis C antibody  . POCT glycosylated hemoglobin (Hb A1C)    Meds ordered this encounter  Medications  . pramipexole (MIRAPEX) 0.5 MG tablet    Sig: TAKE ONE TABLET AT BEDTIME    Dispense:  30 tablet    Refill:  5    Return in about 4 months (around 05/29/2013) for Recheck medical problems.  Kazi Reppond P. Modesto Charon, M.D.

## 2013-01-27 NOTE — Patient Instructions (Signed)
HEALTH MAINTENANCE Immunizations: Tetanus-Diphtheria Booster due:2021 Pertusis Booster [phone] Flu Shot Due: every Fall Pneumonia Vaccine: usually at 59 years of age unless there are certain risk situations. Herpes Zoster/Shingles Vaccine due: usually at 59 years of age HPV WUX:LKGM age 59 to 70 years in males and females.  Healthy Life Habits: Exercise Goal: 5-6 days/week; start gradually(ie 30 minutes/3days per week) Nutrition: Balanced healthy meals including Vegetables and Fruits. Consider  Reading the following books: 1) Eat to Live by Dr Ottis Stain; 2) Prevent and Reverse Heart Disease by Dr Suzzette Righter.  Vitamins:okay Aspirin:as you are doing Stop Tobacco Use:++++ Seat Belt Use:+++ recommended Sunscreen Use:+++ recommended  Recommended Screening Tests: Colon Cancer Screening:porbably due next year. Please check with the GI who did it last. It was less than 10 years ago. Blood work: today Cholesterol Screening: today          HIV:  ?                  Hepatitis C(people born 1945-1965):today  Monthly Self Testicular Exam:+++  Eye Exam: every 1 to 2 years recommended Dental Health: at least every 6 months  Others:    Living Will/Healthcare Power of Attorney: should have this in order with your personal estate planning

## 2013-01-28 LAB — URINE CULTURE

## 2013-01-31 LAB — CMP14+EGFR
ALT: 19 IU/L (ref 0–44)
AST: 19 IU/L (ref 0–40)
Albumin/Globulin Ratio: 2 (ref 1.1–2.5)
Albumin: 4.6 g/dL (ref 3.5–5.5)
Alkaline Phosphatase: 74 IU/L (ref 39–117)
BUN/Creatinine Ratio: 11 (ref 9–20)
BUN: 9 mg/dL (ref 6–24)
CO2: 26 mmol/L (ref 18–29)
Calcium: 9.6 mg/dL (ref 8.7–10.2)
Chloride: 95 mmol/L — ABNORMAL LOW (ref 97–108)
Creatinine, Ser: 0.79 mg/dL (ref 0.76–1.27)
GFR calc Af Amer: 114 mL/min/{1.73_m2} (ref >59)
GFR calc non Af Amer: 98 mL/min/{1.73_m2} (ref >59)
Globulin, Total: 2.3 g/dL (ref 1.5–4.5)
Glucose: 105 mg/dL — ABNORMAL HIGH (ref 65–99)
Potassium: 5.1 mmol/L (ref 3.5–5.2)
Sodium: 132 mmol/L — ABNORMAL LOW (ref 134–144)
Total Bilirubin: 0.2 mg/dL (ref 0.0–1.2)
Total Protein: 6.9 g/dL (ref 6.0–8.5)

## 2013-01-31 LAB — FOLATE: Folate: >19.9 ng/mL (ref >3.0)

## 2013-01-31 LAB — NMR, LIPOPROFILE
Cholesterol: 140 mg/dL (ref <200)
HDL Cholesterol by NMR: 52 mg/dL (ref >=40)
HDL Particle Number: 32.9 umol/L (ref >=30.5)
LDL Particle Number: 843 nmol/L (ref <1000)
LDL Size: 20.9 nm (ref >20.5)
LDLC SERPL CALC-MCNC: 67 mg/dL (ref <100)
LP-IR Score: 46 — ABNORMAL HIGH (ref <=45)
Small LDL Particle Number: 337 nmol/L (ref <=527)
Triglycerides by NMR: 106 mg/dL (ref <150)

## 2013-01-31 LAB — CARBAMAZEPINE, FREE AND TOTAL: Carbamazepine, Free: 1.9 ug/mL (ref 0.6–4.2)

## 2013-01-31 LAB — VITAMIN B12: Vitamin B-12: 733 pg/mL (ref 211–946)

## 2013-01-31 LAB — HEPATITIS C ANTIBODY: Hep C Virus Ab: <0.1 s/co ratio (ref 0.0–0.9)

## 2013-01-31 NOTE — Progress Notes (Signed)
Quick Note:  Lab result at goal.or normal. no signs of UTI. No change in Medications for now. No Change in plans and follow up. ______

## 2013-02-07 ENCOUNTER — Ambulatory Visit: Payer: Medicaid Other | Admitting: Family Medicine

## 2013-02-11 ENCOUNTER — Other Ambulatory Visit: Payer: Self-pay | Admitting: Family Medicine

## 2013-02-12 DIAGNOSIS — F329 Major depressive disorder, single episode, unspecified: Secondary | ICD-10-CM

## 2013-02-12 DIAGNOSIS — I609 Nontraumatic subarachnoid hemorrhage, unspecified: Secondary | ICD-10-CM

## 2013-02-14 NOTE — Telephone Encounter (Signed)
rx called to [facility]

## 2013-02-14 NOTE — Telephone Encounter (Signed)
Prescription renewed in EPIC. 

## 2013-02-14 NOTE — Telephone Encounter (Signed)
I do not see nexium on med list, both will need to be approved

## 2013-02-27 ENCOUNTER — Encounter: Payer: Self-pay | Admitting: *Deleted

## 2013-03-14 ENCOUNTER — Other Ambulatory Visit: Payer: Self-pay | Admitting: Nurse Practitioner

## 2013-03-15 NOTE — Telephone Encounter (Signed)
Last seen 01/27/13, last filled 01/13/13. Route to pool A so it can be called into [facility]

## 2013-03-17 NOTE — Telephone Encounter (Signed)
Rx ready for Phone in. 

## 2013-03-18 NOTE — Telephone Encounter (Signed)
[facility] notified of refill for alprazolam

## 2013-04-12 ENCOUNTER — Other Ambulatory Visit: Payer: Self-pay | Admitting: Family Medicine

## 2013-04-13 NOTE — Telephone Encounter (Signed)
Last seen 01/27/13  FPW  If approved route to nurse to call into [facility] 

## 2013-04-14 DIAGNOSIS — F329 Major depressive disorder, single episode, unspecified: Secondary | ICD-10-CM

## 2013-04-14 DIAGNOSIS — I609 Nontraumatic subarachnoid hemorrhage, unspecified: Secondary | ICD-10-CM

## 2013-04-14 NOTE — Telephone Encounter (Signed)
[facility] notified.

## 2013-04-14 NOTE — Telephone Encounter (Signed)
Rx ready for nurse to Phone in. 

## 2013-05-13 ENCOUNTER — Other Ambulatory Visit: Payer: Self-pay | Admitting: Family Medicine

## 2013-05-16 NOTE — Telephone Encounter (Signed)
[facility] notified of refill for alprazolam  

## 2013-05-16 NOTE — Telephone Encounter (Signed)
Rx ready for nurse to Phone in. 

## 2013-05-16 NOTE — Telephone Encounter (Signed)
Last filled 04/12/13, last seen 01/27/13. Route to pool A, call into [facility]

## 2013-06-13 ENCOUNTER — Other Ambulatory Visit: Payer: Self-pay | Admitting: Family Medicine

## 2013-06-13 ENCOUNTER — Ambulatory Visit: Payer: Medicaid Other | Admitting: Family Medicine

## 2013-06-14 ENCOUNTER — Ambulatory Visit: Payer: Medicaid Other | Admitting: Family Medicine

## 2013-06-14 ENCOUNTER — Telehealth: Payer: Self-pay

## 2013-06-14 DIAGNOSIS — I609 Nontraumatic subarachnoid hemorrhage, unspecified: Secondary | ICD-10-CM

## 2013-06-14 DIAGNOSIS — F329 Major depressive disorder, single episode, unspecified: Secondary | ICD-10-CM

## 2013-06-14 DIAGNOSIS — F3289 Other specified depressive episodes: Secondary | ICD-10-CM

## 2013-06-14 NOTE — Telephone Encounter (Signed)
Family member walked in for appt she said was today--pt not on appt sch  Pt was on appt sch for 06-13-13  Family member said she had "card at home showing appt today" Will need refills sent to [facility]  Per Dr Modesto Charon could not see today and pt resch for next Tuesday Jan 6 appt card given

## 2013-06-16 NOTE — Telephone Encounter (Signed)
Last seen 01/27/13  FPW  If approved route to [facility]

## 2013-06-16 NOTE — Telephone Encounter (Signed)
[facility] called and alprazolam refilled

## 2013-06-16 NOTE — Telephone Encounter (Signed)
Rx ready for nurse to Phone in. 

## 2013-06-20 ENCOUNTER — Other Ambulatory Visit: Payer: Self-pay | Admitting: Family Medicine

## 2013-06-20 ENCOUNTER — Encounter: Payer: Self-pay | Admitting: Family Medicine

## 2013-06-20 ENCOUNTER — Ambulatory Visit (INDEPENDENT_AMBULATORY_CARE_PROVIDER_SITE_OTHER): Payer: Medicaid Other | Admitting: Family Medicine

## 2013-06-20 VITALS — BP 150/81 | HR 92 | Temp 98.0°F | Ht 66.0 in | Wt 158.2 lb

## 2013-06-20 DIAGNOSIS — R569 Unspecified convulsions: Secondary | ICD-10-CM

## 2013-06-20 DIAGNOSIS — G2581 Restless legs syndrome: Secondary | ICD-10-CM

## 2013-06-20 DIAGNOSIS — F17200 Nicotine dependence, unspecified, uncomplicated: Secondary | ICD-10-CM

## 2013-06-20 DIAGNOSIS — R1310 Dysphagia, unspecified: Secondary | ICD-10-CM

## 2013-06-20 DIAGNOSIS — R51 Headache: Secondary | ICD-10-CM

## 2013-06-20 DIAGNOSIS — G4733 Obstructive sleep apnea (adult) (pediatric): Secondary | ICD-10-CM

## 2013-06-20 DIAGNOSIS — I2510 Atherosclerotic heart disease of native coronary artery without angina pectoris: Secondary | ICD-10-CM

## 2013-06-20 DIAGNOSIS — I671 Cerebral aneurysm, nonruptured: Secondary | ICD-10-CM

## 2013-06-20 DIAGNOSIS — E785 Hyperlipidemia, unspecified: Secondary | ICD-10-CM

## 2013-06-20 DIAGNOSIS — J449 Chronic obstructive pulmonary disease, unspecified: Secondary | ICD-10-CM

## 2013-06-20 DIAGNOSIS — F411 Generalized anxiety disorder: Secondary | ICD-10-CM

## 2013-06-20 DIAGNOSIS — F3289 Other specified depressive episodes: Secondary | ICD-10-CM

## 2013-06-20 DIAGNOSIS — I10 Essential (primary) hypertension: Secondary | ICD-10-CM

## 2013-06-20 DIAGNOSIS — R634 Abnormal weight loss: Secondary | ICD-10-CM

## 2013-06-20 DIAGNOSIS — F329 Major depressive disorder, single episode, unspecified: Secondary | ICD-10-CM

## 2013-06-20 DIAGNOSIS — J4489 Other specified chronic obstructive pulmonary disease: Secondary | ICD-10-CM

## 2013-06-20 LAB — POCT CBC
Granulocyte percent: 72.4 %G (ref 37–80)
HCT, POC: 45.2 % (ref 43.5–53.7)
Hemoglobin: 14.1 g/dL (ref 14.1–18.1)
Lymph, poc: 2 (ref 0.6–3.4)
MCH, POC: 30.3 pg (ref 27–31.2)
MCHC: 31.3 g/dL — AB (ref 31.8–35.4)
MCV: 96.8 fL (ref 80–97)
MPV: 6.7 fL (ref 0–99.8)
POC Granulocyte: 5.9 (ref 2–6.9)
POC LYMPH PERCENT: 24.3 %L (ref 10–50)
Platelet Count, POC: 289 10*3/uL (ref 142–424)
RBC: 4.7 M/uL (ref 4.69–6.13)
RDW, POC: 12.9 %
WBC: 8.2 10*3/uL (ref 4.6–10.2)

## 2013-06-20 NOTE — Progress Notes (Signed)
Patient ID: [name], male   DOB: [birth year], 58 y.o.   MRN: [id number] SUBJECTIVE: CC: Chief Complaint  Patient presents with  . Follow-up    refill all meds left leg jerking also forgetting more     HPI: As above  Patient is here for follow up of hyperlipidemia/cerebral aneurysm/seizure disorder/RLS/ongoing tobacco abuse/HTN/COPD denies Headache;denies Chest Pain;denies weakness;on going  Shortness of Breath due to ongoing smoking.;denies Visual changes;denies palpitations;denies cough;denies pedal edema;deniesClaudication symptoms. admits to Compliance with medications; denies Problems with medications.   Past Medical History  Diagnosis Date  . Anxiety   . Seizures   . Cerebral aneurysm   . Closed head injury   . Sleep apnea   . Depression   . GERD (gastroesophageal reflux disease)   . Hyperlipidemia   . Hypertension   . COPD (chronic obstructive pulmonary disease)    No past surgical history on file. History   Social History  . Marital Status: Single    Spouse Name: N/A    Number of Children: N/A  . Years of Education: N/A   Occupational History  . Not on file.   Social History Main Topics  . Smoking status: Current Every Day Smoker  . Smokeless tobacco: Not on file  . Alcohol Use: Not on file  . Drug Use: Not on file  . Sexual Activity: Not on file   Other Topics Concern  . Not on file   Social History Narrative  . No narrative on file   No family history on file. Current Outpatient Prescriptions on File Prior to Visit  Medication Sig Dispense Refill  . albuterol (PROVENTIL) (2.5 MG/3ML) 0.083% nebulizer solution USE 1 VIAL IN NEBULIZER 4 TIMES A DAY AS NEEDED  375 mL  3  . ALPRAZolam (XANAX) 1 MG tablet TAKE  (1)  TABLET  THREE TIMES DAILY.  90 tablet  0  . carbamazepine (TEGRETOL) 100 MG chewable tablet TAKE 2 TABS IN THE MORNING, 1/2 TAB AT NOON, 2 TABS IN THE EVENING  150 tablet  5  . DULoxetine (CYMBALTA) 60 MG capsule TAKE (1) CAPSULE  DAILY  30 capsule  11  . NEXIUM 40 MG capsule TAKE (1) CAPSULE DAILY  30 capsule  5  . NIASPAN 1000 MG CR tablet TAKE ONE TABLET AT BEDTIME  30 tablet  11  . pramipexole (MIRAPEX) 0.5 MG tablet TAKE ONE TABLET AT BEDTIME  30 tablet  5  . risperiDONE (RISPERDAL) 1 MG tablet Take 1 tablet (1 mg total) by mouth 2 (two) times daily.  90 tablet  1  . simvastatin (ZOCOR) 40 MG tablet TAKE ONE TABLET AT BEDTIME  90 tablet  0  . TOPROL XL 25 MG 24 hr tablet TAKE 1 TABLET DAILY  30 tablet  11  . VESICARE 5 MG tablet TAKE (2) TABLETS DAILY  60 tablet  11   No current facility-administered medications on file prior to visit.   Allergies  Allergen Reactions  . Clonidine     REACTION: rash    There is no immunization history on file for this patient. Prior to Admission medications   Medication Sig Start Date End Date Taking? Authorizing Provider  albuterol (PROVENTIL) (2.5 MG/3ML) 0.083% nebulizer solution USE 1 VIAL IN NEBULIZER 4 TIMES A DAY AS NEEDED 04/12/13   Vernie Shanks, MD  ALPRAZolam Duanne Moron) 1 MG tablet TAKE  (1)  TABLET  THREE TIMES DAILY. 06/13/13   Vernie Shanks, MD  carbamazepine (TEGRETOL) 100 MG chewable tablet  TAKE 2 TABS IN THE MORNING, 1/2 TAB AT NOON, 2 TABS IN THE EVENING 01/12/13   Vernie Shanks, MD  DULoxetine (CYMBALTA) 60 MG capsule TAKE (1) CAPSULE DAILY 01/12/13   Vernie Shanks, MD  NEXIUM 40 MG capsule TAKE (1) CAPSULE DAILY 02/11/13   Vernie Shanks, MD  NIASPAN 1000 MG CR tablet TAKE ONE TABLET AT BEDTIME 01/12/13   Vernie Shanks, MD  pramipexole (MIRAPEX) 0.5 MG tablet TAKE ONE TABLET AT BEDTIME 01/27/13   Vernie Shanks, MD  risperiDONE (RISPERDAL) 1 MG tablet Take 1 tablet (1 mg total) by mouth 2 (two) times daily. 11/04/12   Vernie Shanks, MD  simvastatin (ZOCOR) 40 MG tablet TAKE ONE TABLET AT BEDTIME 01/16/13   Chipper Herb, MD  TOPROL XL 25 MG 24 hr tablet TAKE 1 TABLET DAILY 01/12/13   Vernie Shanks, MD  VESICARE 5 MG tablet TAKE (2) TABLETS DAILY 01/12/13    Vernie Shanks, MD     ROS: As above in the HPI. All other systems are stable or negative.  OBJECTIVE: APPEARANCE:  Patient in no acute distress.The patient appeared well nourished and normally developed. Acyanotic. Waist: VITAL SIGNS:BP 150/81  Pulse 92  Temp(Src) 98 F (36.7 C) (Oral)  Ht $R'5\' 6"'kw$  (1.676 m)  Wt 158 lb 3.2 oz (71.759 kg)  BMI 25.55 kg/m2 WM laughing a lot  SKIN: warm and  Dry without overt rashes, tattoos and scars  HEAD and Neck: without JVD, Head and scalp: normal Eyes:No scleral icterus. Fundi normal, eye movements normal. Ears: Auricle normal, canal normal, Tympanic membranes normal, insufflation normal. Nose: normal Throat: normal Neck & thyroid: normal  CHEST & LUNGS: Chest wall: normal Lungs: Clear  CVS: Reveals the PMI to be normally located. Regular rhythm, First and Second Heart sounds are normal,  absence of murmurs, rubs or gallops. Peripheral vasculature: Radial pulses: normal Dorsal pedis pulses: normal Posterior pulses: normal  ABDOMEN:  Appearance: normal Benign, no organomegaly, no masses, no Abdominal Aortic enlargement. No Guarding , no rebound. No Bruits. Bowel sounds: normal  RECTAL: N/A GU: N/A  EXTREMETIES: nonedematous.  MUSCULOSKELETAL:  Spine: normal Joints: intact  NEUROLOGIC: oriented to person;  Right hemiparesis from the past CVA. No changes.ambulates with a cane.  Results for orders placed in visit on 06/20/13  POCT CBC      Result Value Range   WBC 8.2  4.6 - 10.2 K/uL   Lymph, poc 2.0  0.6 - 3.4   POC LYMPH PERCENT 24.3  10 - 50 %L   POC Granulocyte 5.9  2 - 6.9   Granulocyte percent 72.4  37 - 80 %G   RBC 4.7  4.69 - 6.13 M/uL   Hemoglobin 14.1  14.1 - 18.1 g/dL   HCT, POC 45.2  43.5 - 53.7 %   MCV 96.8  80 - 97 fL   MCH, POC 30.3  27 - 31.2 pg   MCHC 31.3 (*) 31.8 - 35.4 g/dL   RDW, POC 12.9     Platelet Count, POC 289.0  142 - 424 K/uL   MPV 6.7  0 - 99.8 fL     ASSESSMENT: RLS  (restless legs syndrome)  HEADACHE, CHRONIC  DYSPHAGIA UNSPECIFIED  DEPRESSION  ANXIETY  TOBACCO ABUSE  SEIZURE DISORDER - Plan: POCT CBC, CMP14+EGFR, Folate, Vitamin B12, Carbamazepine level, free, Carbamazepine level, total, Ambulatory referral to Neurology  OBSTRUCTIVE SLEEP APNEA  HYPERTENSION - Plan: CMP14+EGFR, NMR, lipoprofile  HYPERLIPIDEMIA - Plan:  CMP14+EGFR  CEREBRAL ANEURYSM  CAD (coronary artery disease)  C O P D  Loss of weight - Plan: POCT CBC, Thyroid Panel With TSH  Possible breakthrough seizures?  PLAN: Will refer to neurologist.    Orders Placed This Encounter  Procedures  . CMP14+EGFR  . Folate  . Vitamin B12  . Carbamazepine level, free  . Carbamazepine level, total  . NMR, lipoprofile  . Thyroid Panel With TSH  . Ambulatory referral to Neurology    Referral Priority:  Routine    Referral Type:  Consultation    Referral Reason:  Specialty Services Required    Requested Specialty:  Neurology    Number of Visits Requested:  1  . POCT CBC  await labs. meds were refilled recently. Discussed with daughters.   No orders of the defined types were placed in this encounter.   There are no discontinued medications. Return in about 2 weeks (around 07/04/2013) for recheck weight.  Tailor Westfall P. Jacelyn Grip, M.D.

## 2013-06-21 LAB — NMR, LIPOPROFILE
Cholesterol: 139 mg/dL (ref <200)
HDL Cholesterol by NMR: 64 mg/dL (ref >=40)
HDL Particle Number: 37.4 umol/L (ref >=30.5)
LDL Particle Number: 687 nmol/L (ref <1000)
LDL Size: 20.7 nm (ref >20.5)
LDLC SERPL CALC-MCNC: 57 mg/dL (ref <100)
LP-IR Score: 38 (ref <=45)
Small LDL Particle Number: 412 nmol/L (ref <=527)
Triglycerides by NMR: 88 mg/dL (ref <150)

## 2013-06-21 LAB — CMP14+EGFR
ALT: 18 IU/L (ref 0–44)
AST: 22 IU/L (ref 0–40)
Albumin/Globulin Ratio: 2.3 (ref 1.1–2.5)
Albumin: 5.2 g/dL (ref 3.5–5.5)
Alkaline Phosphatase: 68 IU/L (ref 39–117)
BUN/Creatinine Ratio: 14 (ref 9–20)
BUN: 10 mg/dL (ref 6–24)
CO2: 24 mmol/L (ref 18–29)
Calcium: 9.7 mg/dL (ref 8.7–10.2)
Chloride: 88 mmol/L — ABNORMAL LOW (ref 97–108)
Creatinine, Ser: 0.7 mg/dL — ABNORMAL LOW (ref 0.76–1.27)
GFR calc Af Amer: 119 mL/min/{1.73_m2} (ref >59)
GFR calc non Af Amer: 103 mL/min/{1.73_m2} (ref >59)
Globulin, Total: 2.3 g/dL (ref 1.5–4.5)
Glucose: 107 mg/dL — ABNORMAL HIGH (ref 65–99)
Potassium: 5 mmol/L (ref 3.5–5.2)
Sodium: 130 mmol/L — ABNORMAL LOW (ref 134–144)
Total Bilirubin: 0.3 mg/dL (ref 0.0–1.2)
Total Protein: 7.5 g/dL (ref 6.0–8.5)

## 2013-06-21 LAB — FOLATE: Folate: >19.9 ng/mL (ref >3.0)

## 2013-06-21 LAB — THYROID PANEL WITH TSH
Free Thyroxine Index: 2 (ref 1.2–4.9)
T3 Uptake Ratio: 30 % (ref 24–39)
T4, Total: 6.8 ug/dL (ref 4.5–12.0)
TSH: 1.52 u[IU]/mL (ref 0.450–4.500)

## 2013-06-21 LAB — CARBAMAZEPINE, FREE AND TOTAL: Carbamazepine, Free: 1.9 ug/mL (ref 0.6–4.2)

## 2013-06-21 LAB — CARBAMAZEPINE LEVEL, TOTAL: Carbamazepine Lvl: 8.1 ug/mL (ref 4.0–12.0)

## 2013-06-21 LAB — VITAMIN B12: Vitamin B-12: 704 pg/mL (ref 211–946)

## 2013-06-22 NOTE — Telephone Encounter (Signed)
Last seen 06/20/13  FPW  If approved [facility]

## 2013-06-24 NOTE — Telephone Encounter (Signed)
Rx ready for nurse to Phone in. 

## 2013-06-26 NOTE — Telephone Encounter (Signed)
[facility]

## 2013-07-07 ENCOUNTER — Encounter: Payer: Self-pay | Admitting: Family Medicine

## 2013-07-07 ENCOUNTER — Ambulatory Visit (INDEPENDENT_AMBULATORY_CARE_PROVIDER_SITE_OTHER): Payer: Medicaid Other | Admitting: Family Medicine

## 2013-07-07 VITALS — BP 137/75 | HR 84 | Temp 97.0°F | Wt 156.8 lb

## 2013-07-07 DIAGNOSIS — I671 Cerebral aneurysm, nonruptured: Secondary | ICD-10-CM

## 2013-07-07 DIAGNOSIS — I10 Essential (primary) hypertension: Secondary | ICD-10-CM

## 2013-07-07 DIAGNOSIS — R634 Abnormal weight loss: Secondary | ICD-10-CM

## 2013-07-07 DIAGNOSIS — J449 Chronic obstructive pulmonary disease, unspecified: Secondary | ICD-10-CM

## 2013-07-07 DIAGNOSIS — E785 Hyperlipidemia, unspecified: Secondary | ICD-10-CM

## 2013-07-07 DIAGNOSIS — R569 Unspecified convulsions: Secondary | ICD-10-CM

## 2013-07-07 DIAGNOSIS — G4733 Obstructive sleep apnea (adult) (pediatric): Secondary | ICD-10-CM

## 2013-07-07 DIAGNOSIS — I2510 Atherosclerotic heart disease of native coronary artery without angina pectoris: Secondary | ICD-10-CM

## 2013-07-07 DIAGNOSIS — F17200 Nicotine dependence, unspecified, uncomplicated: Secondary | ICD-10-CM

## 2013-07-07 DIAGNOSIS — J4489 Other specified chronic obstructive pulmonary disease: Secondary | ICD-10-CM

## 2013-07-07 NOTE — Progress Notes (Signed)
Patient ID: [name], male   DOB: 1956-04-06, 58 y.o.   MRN: [id number] SUBJECTIVE: CC: Chief Complaint  Patient presents with  . Follow-up    2 WEEK RECK FOLLOW UP WEIGHT .  WEIGHT THE OTHER DAY WAS 158.3LB  ALSO PT "SCRATCHING SORES ON BODY"    HPI: Continues with weight loss. Eats a lot of food and calories 5 x a day. Supplements given as well. Smokes heavily. Denies cough. Denies pain. Still has twitches. Has appointment with the Neurologist.  Past Medical History  Diagnosis Date  . Anxiety   . Seizures   . Cerebral aneurysm   . Closed head injury   . Sleep apnea   . Depression   . GERD (gastroesophageal reflux disease)   . Hyperlipidemia   . Hypertension   . COPD (chronic obstructive pulmonary disease)    No past surgical history on file. History   Social History  . Marital Status: Single    Spouse Name: N/A    Number of Children: N/A  . Years of Education: N/A   Occupational History  . Not on file.   Social History Main Topics  . Smoking status: Current Every Day Smoker  . Smokeless tobacco: Not on file  . Alcohol Use: Not on file  . Drug Use: Not on file  . Sexual Activity: Not on file   Other Topics Concern  . Not on file   Social History Narrative  . No narrative on file   No family history on file. Current Outpatient Prescriptions on File Prior to Visit  Medication Sig Dispense Refill  . albuterol (PROVENTIL) (2.5 MG/3ML) 0.083% nebulizer solution USE 1 VIAL IN NEBULIZER 4 TIMES A DAY AS NEEDED  375 mL  3  . ALPRAZolam (XANAX) 1 MG tablet TAKE  (1)  TABLET  THREE TIMES DAILY.  90 tablet  0  . carbamazepine (TEGRETOL) 100 MG chewable tablet TAKE 2 TABS IN THE MORNING, 1/2 TAB AT NOON, 2 TABS IN THE EVENING  150 tablet  5  . DULoxetine (CYMBALTA) 60 MG capsule TAKE (1) CAPSULE DAILY  30 capsule  11  . NEXIUM 40 MG capsule TAKE (1) CAPSULE DAILY  30 capsule  5  . NIASPAN 1000 MG CR tablet TAKE ONE TABLET AT BEDTIME  30 tablet  11  .  pramipexole (MIRAPEX) 0.5 MG tablet TAKE ONE TABLET AT BEDTIME  30 tablet  5  . risperiDONE (RISPERDAL) 1 MG tablet Take 1 tablet (1 mg total) by mouth 2 (two) times daily.  90 tablet  1  . simvastatin (ZOCOR) 40 MG tablet TAKE ONE TABLET AT BEDTIME  90 tablet  0  . TOPROL XL 25 MG 24 hr tablet TAKE 1 TABLET DAILY  30 tablet  11  . VESICARE 5 MG tablet TAKE (2) TABLETS DAILY  60 tablet  11   No current facility-administered medications on file prior to visit.   Allergies  Allergen Reactions  . Clonidine     REACTION: rash   Immunization History  Administered Date(s) Administered  . Tdap 10/25/2009   Prior to Admission medications   Medication Sig Start Date End Date Taking? Authorizing Provider  albuterol (PROVENTIL) (2.5 MG/3ML) 0.083% nebulizer solution USE 1 VIAL IN NEBULIZER 4 TIMES A DAY AS NEEDED 04/12/13  Yes Ileana Ladd, MD  ALPRAZolam Prudy Feeler) 1 MG tablet TAKE  (1)  TABLET  THREE TIMES DAILY. 06/20/13  Yes Ileana Ladd, MD  carbamazepine (TEGRETOL) 100 MG chewable tablet TAKE  2 TABS IN THE MORNING, 1/2 TAB AT NOON, 2 TABS IN THE EVENING 01/12/13  Yes Ileana LaddFrancis P Kona Lover, MD  DULoxetine (CYMBALTA) 60 MG capsule TAKE (1) CAPSULE DAILY 01/12/13  Yes Ileana LaddFrancis P Nakia Koble, MD  NEXIUM 40 MG capsule TAKE (1) CAPSULE DAILY 02/11/13  Yes Ileana LaddFrancis P Seve Monette, MD  NIASPAN 1000 MG CR tablet TAKE ONE TABLET AT BEDTIME 01/12/13  Yes Ileana LaddFrancis P Arvel Oquinn, MD  pramipexole (MIRAPEX) 0.5 MG tablet TAKE ONE TABLET AT BEDTIME 01/27/13  Yes Ileana LaddFrancis P Zakhari Fogel, MD  risperiDONE (RISPERDAL) 1 MG tablet Take 1 tablet (1 mg total) by mouth 2 (two) times daily. 11/04/12  Yes Ileana LaddFrancis P Virgen Belland, MD  simvastatin (ZOCOR) 40 MG tablet TAKE ONE TABLET AT BEDTIME 01/16/13  Yes Ernestina Pennaonald W Moore, MD  TOPROL XL 25 MG 24 hr tablet TAKE 1 TABLET DAILY 01/12/13  Yes Ileana LaddFrancis P Jomarie Gellis, MD  VESICARE 5 MG tablet TAKE (2) TABLETS DAILY 01/12/13  Yes Ileana LaddFrancis P Nayali Talerico, MD     ROS: As above in the HPI. All other systems are stable or  negative.  OBJECTIVE: APPEARANCE:  Patient in no acute distress.The patient appeared well nourished and normally developed. Acyanotic. Waist: VITAL SIGNS:BP 137/75  Pulse 84  Temp(Src) 97 F (36.1 C) (Oral)  Wt 156 lb 12.8 oz (71.124 kg) WM  Ongoing weight loss.  SKIN: warm and  Dry without overt rashes, tattoos and scars  HEAD and Neck: without JVD, Head and scalp: normal Eyes:No scleral icterus. Fundi normal, eye movements normal. Ears: Auricle normal, canal normal, Tympanic membranes normal, insufflation normal. Nose: normal Throat: normal Neck & thyroid: normal  CHEST & LUNGS: Chest wall: normal Lungs: Coarse breath sounds  CVS: Reveals the PMI to be normally located. Regular rhythm, First and Second Heart sounds are normal,  absence of murmurs, rubs or gallops.   ABDOMEN:  Appearance: normal Benign, no organomegaly, no masses, no Abdominal Aortic enlargement. No Guarding , no rebound. No Bruits. Bowel sounds: normal  RECTAL: N/A GU: N/A  EXTREMETIES: nonedematous.   NEUROLOGIC: oriented to person; no changes   ASSESSMENT: TOBACCO ABUSE - Plan: DG Chest 2 View  SEIZURE DISORDER  OBSTRUCTIVE SLEEP APNEA  HYPERTENSION  HYPERLIPIDEMIA  CEREBRAL ANEURYSM  CAD (coronary artery disease)  C O P D  Loss of weight - Plan: DG Chest 2 View, CT Abdomen Pelvis W Contrast  PLAN:  Orders Placed This Encounter  Procedures  . DG Chest 2 View    Standing Status: Future     Number of Occurrences:      Standing Expiration Date: 09/05/2014    Order Specific Question:  Reason for Exam (SYMPTOM  OR DIAGNOSIS REQUIRED)    Answer:  smoker, weight loss    Order Specific Question:  Preferred imaging location?    Answer[facility]  . CT Abdomen Pelvis W Contrast    Standing Status: Future     Number of Occurrences:      Standing Expiration Date: 10/06/2014    Order Specific Question:  Reason for Exam (SYMPTOM  OR DIAGNOSIS REQUIRED)    Answer:   weight loss, r/o malignancy    Order Specific Question:  Preferred imaging location?    Answer[facility]  discussed with daughter risk for a malignancy. Smoking cessation.  No orders of the defined types were placed in this encounter.   There are no discontinued medications. Return in about 2 weeks (around 07/21/2013) for Recheck medical problems.  Ido Wollman P. Modesto CharonWong, M.D.

## 2013-07-10 ENCOUNTER — Other Ambulatory Visit (INDEPENDENT_AMBULATORY_CARE_PROVIDER_SITE_OTHER): Payer: Medicaid Other

## 2013-07-10 DIAGNOSIS — F17200 Nicotine dependence, unspecified, uncomplicated: Secondary | ICD-10-CM

## 2013-07-10 DIAGNOSIS — R634 Abnormal weight loss: Secondary | ICD-10-CM

## 2013-07-14 ENCOUNTER — Other Ambulatory Visit: Payer: Self-pay | Admitting: Family Medicine

## 2013-07-17 ENCOUNTER — Telehealth: Payer: Self-pay | Admitting: Family Medicine

## 2013-07-17 NOTE — Progress Notes (Signed)
Quick Note:  Call patient. Labs: Chest Xray normal. No change in plan. ______

## 2013-07-17 NOTE — Telephone Encounter (Signed)
Waiting on provider to sign off on results and then we can notify him.

## 2013-07-18 NOTE — Telephone Encounter (Signed)
Per notes [facility] called them yeterday

## 2013-08-04 ENCOUNTER — Telehealth: Payer: Self-pay | Admitting: Family Medicine

## 2013-08-12 DIAGNOSIS — F3289 Other specified depressive episodes: Secondary | ICD-10-CM

## 2013-08-12 DIAGNOSIS — F329 Major depressive disorder, single episode, unspecified: Secondary | ICD-10-CM

## 2013-08-12 DIAGNOSIS — I609 Nontraumatic subarachnoid hemorrhage, unspecified: Secondary | ICD-10-CM

## 2013-08-14 ENCOUNTER — Other Ambulatory Visit: Payer: Self-pay | Admitting: Family Medicine

## 2013-08-14 ENCOUNTER — Other Ambulatory Visit: Payer: Self-pay | Admitting: Neurology

## 2013-08-14 DIAGNOSIS — F0150 Vascular dementia without behavioral disturbance: Secondary | ICD-10-CM

## 2013-08-16 NOTE — Telephone Encounter (Signed)
Last seen 07/07/13  FPW  If approved route to nurse to [facility]

## 2013-08-17 ENCOUNTER — Other Ambulatory Visit: Payer: Self-pay | Admitting: Family Medicine

## 2013-08-17 MED ORDER — ALPRAZOLAM 1 MG PO TABS: ORAL_TABLET | ORAL | Status: DC

## 2013-08-17 NOTE — Telephone Encounter (Signed)
Taken care of in refill request

## 2013-08-17 NOTE — Telephone Encounter (Signed)
Called in.

## 2013-08-17 NOTE — Telephone Encounter (Signed)
Call patient : Prescription refilled & sent to pharmacy in [facility]. 

## 2013-08-17 NOTE — Telephone Encounter (Signed)
Rx ready for nurse to Phone in. 

## 2013-08-24 ENCOUNTER — Ambulatory Visit (HOSPITAL_COMMUNITY)
Admission: RE | Admit: 2013-08-24 | Discharge: 2013-08-24 | Disposition: A | Discharge status: Home / Self Care | Payer: Medicaid Other | Source: Ambulatory Visit | Attending: Neurology | Admitting: Neurology

## 2013-08-24 DIAGNOSIS — R4182 Altered mental status, unspecified: Secondary | ICD-10-CM | POA: Insufficient documentation

## 2013-08-24 DIAGNOSIS — Z87891 Personal history of nicotine dependence: Secondary | ICD-10-CM | POA: Insufficient documentation

## 2013-08-24 DIAGNOSIS — F0150 Vascular dementia without behavioral disturbance: Secondary | ICD-10-CM

## 2013-08-24 DIAGNOSIS — G319 Degenerative disease of nervous system, unspecified: Secondary | ICD-10-CM | POA: Insufficient documentation

## 2013-08-24 DIAGNOSIS — E785 Hyperlipidemia, unspecified: Secondary | ICD-10-CM | POA: Insufficient documentation

## 2013-08-24 DIAGNOSIS — I10 Essential (primary) hypertension: Secondary | ICD-10-CM | POA: Insufficient documentation

## 2013-08-24 DIAGNOSIS — I6789 Other cerebrovascular disease: Secondary | ICD-10-CM | POA: Insufficient documentation

## 2013-08-24 DIAGNOSIS — Z8673 Personal history of transient ischemic attack (TIA), and cerebral infarction without residual deficits: Secondary | ICD-10-CM | POA: Insufficient documentation

## 2013-09-01 ENCOUNTER — Other Ambulatory Visit (HOSPITAL_COMMUNITY): Payer: Self-pay

## 2013-09-01 DIAGNOSIS — G4730 Sleep apnea, unspecified: Secondary | ICD-10-CM

## 2013-09-08 ENCOUNTER — Ambulatory Visit: Payer: Medicaid Other | Attending: Neurology | Admitting: Sleep Medicine

## 2013-09-08 ENCOUNTER — Encounter: Payer: Self-pay | Admitting: Neurology

## 2013-09-08 VITALS — Ht 67.2 in | Wt 161.0 lb

## 2013-09-08 DIAGNOSIS — G4730 Sleep apnea, unspecified: Secondary | ICD-10-CM

## 2013-09-12 ENCOUNTER — Other Ambulatory Visit: Payer: Self-pay | Admitting: Family Medicine

## 2013-09-13 NOTE — Telephone Encounter (Signed)
Patient last seen in office on 07-07-13. Rx last filled on 08-17-13 for #90. Please advise. If approved please route to Pool A so nurse can phone in to pharmacy

## 2013-09-14 NOTE — Telephone Encounter (Signed)
Called in refill to pharmacy 

## 2013-09-14 NOTE — Telephone Encounter (Signed)
Rx ready for nurse to Phone in. 

## 2013-09-16 NOTE — Sleep Study (Signed)
  [facility] [name] A. Gerilyn Pilgrimoonquah, MD     [email or web address]          LOCATION: SLEEP LAB FACILITY: [facility]  PHYSICIAN: [name], M.D.   DATE OF STUDY: 09/08/2013.  NOCTURNAL POLYSOMNOGRAM   REFERRING PHYSICIAN: [name].  INDICATIONS: This is a 58 year old presents with fatigue and snoring.  MEDICATIONS:  Prior to Admission medications   Medication Sig Start Date End Date Taking? Authorizing Provider  albuterol (PROVENTIL) (2.5 MG/3ML) 0.083% nebulizer solution USE 1 VIAL IN NEBULIZER 4 TIMES A DAY AS NEEDED    Ileana LaddFrancis P Wong, MD  ALPRAZolam Prudy Feeler(XANAX) 1 MG tablet TAKE  (1)  TABLET  THREE TIMES DAILY.    Ileana LaddFrancis P Wong, MD  carbamazepine (TEGRETOL) 100 MG chewable tablet TAKE 2 TABS IN THE MORNING, 1/2 TAB AT NOON, 2 TABS IN THE EVENING 01/12/13   Ileana LaddFrancis P Wong, MD  carbamazepine (TEGRETOL) 100 MG chewable tablet TAKE 2 TABLETS IN THE MORNING, 1/2 AT NOON, AND 2 IN THE EVENING    Ileana LaddFrancis P Wong, MD  DULoxetine (CYMBALTA) 60 MG capsule TAKE (1) CAPSULE DAILY 01/12/13   Ileana LaddFrancis P Wong, MD  NEXIUM 40 MG capsule TAKE (1) CAPSULE DAILY    Ileana LaddFrancis P Wong, MD  NIASPAN 1000 MG CR tablet TAKE ONE TABLET AT BEDTIME 01/12/13   Ileana LaddFrancis P Wong, MD  pramipexole (MIRAPEX) 0.25 MG tablet TAKE ONE TABLET AT BEDTIME 07/14/13   Ernestina Pennaonald W Moore, MD  pramipexole (MIRAPEX) 0.5 MG tablet TAKE ONE TABLET AT BEDTIME 01/27/13   Ileana LaddFrancis P Wong, MD  risperiDONE (RISPERDAL) 1 MG tablet Take 1 tablet (1 mg total) by mouth 2 (two) times daily. 11/04/12   Ileana LaddFrancis P Wong, MD  simvastatin (ZOCOR) 40 MG tablet TAKE ONE TABLET AT BEDTIME    Ernestina Pennaonald W Moore, MD  TOPROL XL 25 MG 24 hr tablet TAKE 1 TABLET DAILY 01/12/13   Ileana LaddFrancis P Wong, MD  VESICARE 5 MG tablet TAKE (2) TABLETS DAILY 01/12/13   Ileana LaddFrancis P Wong, MD      EPWORTH SLEEPINESS SCALE: 4.   BMI: 26.   ARCHITECTURAL SUMMARY: Total recording time was 423 minutes. Sleep efficiency 73 %. Sleep latency 60 minutes. REM latency 63 minutes. Stage NI 3 %,  N2 66 % and N3 12 % and REM sleep 19 %.    RESPIRATORY DATA:  Baseline oxygen saturation is 96 %. The lowest saturation is 88 %. The diagnostic AHI is 1. The RDI is 1. The REM AHI is 3.  LIMB MOVEMENT SUMMARY: PLM index 48.   ELECTROCARDIOGRAM SUMMARY: Average heart rate is 70 with no significant dysrhythmias observed.   IMPRESSION:  1. 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  Thanks for this referral.  [name], M.D. Diplomat, Biomedical engineerAmerican Board of Sleep Medicine.

## 2013-09-20 ENCOUNTER — Telehealth: Payer: Self-pay | Admitting: Family Medicine

## 2013-09-20 NOTE — Telephone Encounter (Signed)
Patient was referred to Dr. Gerilyn Pilgrimoonquah and the MRI, labs, EEG, and Sleep Study were ordered by Dr. Gerilyn Pilgrimoonquah. I spoke with his office and [name] is suppose to see him on 4/16 to discuss these results and f/u with Dr. Modesto CharonWong on 4/20. Dr. Ronal Fearoonquah's office is going to call and discuss this with them.

## 2013-10-03 ENCOUNTER — Ambulatory Visit (INDEPENDENT_AMBULATORY_CARE_PROVIDER_SITE_OTHER): Payer: Medicaid Other | Admitting: Family Medicine

## 2013-10-03 ENCOUNTER — Encounter: Payer: Self-pay | Admitting: Family Medicine

## 2013-10-03 VITALS — BP 126/74 | HR 63 | Temp 98.1°F | Ht 67.0 in | Wt 154.8 lb

## 2013-10-03 DIAGNOSIS — E785 Hyperlipidemia, unspecified: Secondary | ICD-10-CM

## 2013-10-03 DIAGNOSIS — J4489 Other specified chronic obstructive pulmonary disease: Secondary | ICD-10-CM

## 2013-10-03 DIAGNOSIS — R634 Abnormal weight loss: Secondary | ICD-10-CM

## 2013-10-03 DIAGNOSIS — G4733 Obstructive sleep apnea (adult) (pediatric): Secondary | ICD-10-CM

## 2013-10-03 DIAGNOSIS — J449 Chronic obstructive pulmonary disease, unspecified: Secondary | ICD-10-CM

## 2013-10-03 DIAGNOSIS — I671 Cerebral aneurysm, nonruptured: Secondary | ICD-10-CM

## 2013-10-03 DIAGNOSIS — R51 Headache: Secondary | ICD-10-CM

## 2013-10-03 DIAGNOSIS — G40909 Epilepsy, unspecified, not intractable, without status epilepticus: Secondary | ICD-10-CM

## 2013-10-03 DIAGNOSIS — F17200 Nicotine dependence, unspecified, uncomplicated: Secondary | ICD-10-CM

## 2013-10-03 DIAGNOSIS — F411 Generalized anxiety disorder: Secondary | ICD-10-CM

## 2013-10-03 DIAGNOSIS — I2510 Atherosclerotic heart disease of native coronary artery without angina pectoris: Secondary | ICD-10-CM

## 2013-10-03 DIAGNOSIS — I10 Essential (primary) hypertension: Secondary | ICD-10-CM

## 2013-10-03 DIAGNOSIS — R569 Unspecified convulsions: Secondary | ICD-10-CM

## 2013-10-03 DIAGNOSIS — G2581 Restless legs syndrome: Secondary | ICD-10-CM

## 2013-10-03 MED ORDER — PRAMIPEXOLE DIHYDROCHLORIDE 0.5 MG PO TABS: ORAL_TABLET | ORAL | Status: DC

## 2013-10-03 MED ORDER — RISPERIDONE 1 MG PO TABS: 1.0000 mg | ORAL_TABLET | Freq: Two times a day (BID) | ORAL | Status: DC

## 2013-10-03 MED ORDER — NIACIN ER (ANTIHYPERLIPIDEMIC) 1000 MG PO TBCR: EXTENDED_RELEASE_TABLET | ORAL | Status: DC

## 2013-10-03 MED ORDER — METOPROLOL SUCCINATE ER 25 MG PO TB24: ORAL_TABLET | ORAL | Status: DC

## 2013-10-03 MED ORDER — ALBUTEROL SULFATE (2.5 MG/3ML) 0.083% IN NEBU: INHALATION_SOLUTION | RESPIRATORY_TRACT | Status: DC

## 2013-10-03 MED ORDER — ESOMEPRAZOLE MAGNESIUM 40 MG PO CPDR: DELAYED_RELEASE_CAPSULE | ORAL | Status: DC

## 2013-10-03 MED ORDER — DULOXETINE HCL 60 MG PO CPEP: ORAL_CAPSULE | ORAL | Status: DC

## 2013-10-03 MED ORDER — SIMVASTATIN 40 MG PO TABS: ORAL_TABLET | ORAL | Status: DC

## 2013-10-03 MED ORDER — SOLIFENACIN SUCCINATE 5 MG PO TABS: ORAL_TABLET | ORAL | Status: DC

## 2013-10-03 MED ORDER — ALPRAZOLAM 1 MG PO TABS: ORAL_TABLET | ORAL | Status: DC

## 2013-10-03 NOTE — Progress Notes (Signed)
Patient ID: [name], male   DOB: [birth year], 58 y.o.   MRN: [id number] SUBJECTIVE: CC: Chief Complaint  Patient presents with  . Follow-up    follow jup chronic problems sister states they took cpap from him cause he was not wearing mask but she wants itts back and to order the "nasal prongs" also needs refills     HPI: Would like him to have Cpap. Because he was supposed to be on it in the past and he refused. However he had a sleep study by Dr Janann Colonel in March. Needs medications  Refilled. Loss of weight. Somehow did not get CT abdomen scheduled. Dr Janann Colonel said she was to wean patient off the tegretol. Continues to smoke.  Past Medical History  Diagnosis Date  . Anxiety   . Seizures   . Cerebral aneurysm   . Closed head injury   . Sleep apnea   . Depression   . GERD (gastroesophageal reflux disease)   . Hyperlipidemia   . Hypertension   . COPD (chronic obstructive pulmonary disease)    No past surgical history on file. History   Social History  . Marital Status: Single    Spouse Name: N/A    Number of Children: N/A  . Years of Education: N/A   Occupational History  . Not on file.   Social History Main Topics  . Smoking status: Current Every Day Smoker  . Smokeless tobacco: Not on file  . Alcohol Use: Not on file  . Drug Use: Not on file  . Sexual Activity: Not on file   Other Topics Concern  . Not on file   Social History Narrative  . No narrative on file   No family history on file. Current Outpatient Prescriptions on File Prior to Visit  Medication Sig Dispense Refill  . carbamazepine (TEGRETOL) 100 MG chewable tablet TAKE 2 TABS IN THE MORNING, 1/2 TAB AT NOON, 2 TABS IN THE EVENING  150 tablet  5  . carbamazepine (TEGRETOL) 100 MG chewable tablet TAKE 2 TABLETS IN THE MORNING, 1/2 AT NOON, AND 2 IN THE EVENING  135 tablet  4   No current facility-administered medications on file prior to visit.   Allergies  Allergen Reactions  .  Clonidine     REACTION: rash   Immunization History  Administered Date(s) Administered  . Tdap 10/25/2009   Prior to Admission medications   Medication Sig Start Date End Date Taking? Authorizing Provider  albuterol (PROVENTIL) (2.5 MG/3ML) 0.083% nebulizer solution USE 1 VIAL IN NEBULIZER 4 TIMES A DAY AS NEEDED    Ileana Ladd, MD  ALPRAZolam Prudy Feeler) 1 MG tablet TAKE  (1)  TABLET  THREE TIMES DAILY.    Ileana Ladd, MD  carbamazepine (TEGRETOL) 100 MG chewable tablet TAKE 2 TABS IN THE MORNING, 1/2 TAB AT NOON, 2 TABS IN THE EVENING 01/12/13   Ileana Ladd, MD  carbamazepine (TEGRETOL) 100 MG chewable tablet TAKE 2 TABLETS IN THE MORNING, 1/2 AT NOON, AND 2 IN THE EVENING    Ileana Ladd, MD  DULoxetine (CYMBALTA) 60 MG capsule TAKE (1) CAPSULE DAILY 01/12/13   Ileana Ladd, MD  NEXIUM 40 MG capsule TAKE (1) CAPSULE DAILY    Ileana Ladd, MD  NIASPAN 1000 MG CR tablet TAKE ONE TABLET AT BEDTIME 01/12/13   Ileana Ladd, MD  pramipexole (MIRAPEX) 0.25 MG tablet TAKE ONE TABLET AT BEDTIME 07/14/13   Ernestina Penna, MD  pramipexole (MIRAPEX)  0.5 MG tablet TAKE ONE TABLET AT BEDTIME 01/27/13   Vernie Shanks, MD  risperiDONE (RISPERDAL) 1 MG tablet Take 1 tablet (1 mg total) by mouth 2 (two) times daily. 11/04/12   Vernie Shanks, MD  simvastatin (ZOCOR) 40 MG tablet TAKE ONE TABLET AT BEDTIME    Chipper Herb, MD  TOPROL XL 25 MG 24 hr tablet TAKE 1 TABLET DAILY 01/12/13   Vernie Shanks, MD  VESICARE 5 MG tablet TAKE (2) TABLETS DAILY 01/12/13   Vernie Shanks, MD     ROS: As above in the HPI. All other systems are stable or negative.  OBJECTIVE: APPEARANCE:  Patient in no acute distress.The patient appeared well nourished and normally developed. Acyanotic. Waist: VITAL SIGNS:BP 126/74  Pulse 63  Temp(Src) 98.1 F (36.7 C) (Oral)  Ht $R'5\' 7"'om$  (1.702 m)  Wt 154 lb 12.8 oz (70.217 kg)  BMI 24.24 kg/m2  WM Smells of tobacco smoke.  SKIN: warm and  Dry without overt rashes,  tattoos and scars  HEAD and Neck: without JVD, Head and scalp: normal Eyes:No scleral icterus. Fundi normal, eye movements normal. Ears: Auricle normal, canal normal, Tympanic membranes normal, insufflation normal. Nose: normal Throat: normal Neck & thyroid: normal  CHEST & LUNGS: Chest wall: normal Lungs: Clear  CVS: Reveals the PMI to be normally located. Regular rhythm, First and Second Heart sounds are normal,  absence of murmurs, rubs or gallops. Peripheral vasculature: Radial pulses: normal Dorsal pedis pulses: normal Posterior pulses: normal  ABDOMEN:  Appearance: normal Benign, no organomegaly, no masses, no Abdominal Aortic enlargement. No Guarding , no rebound. No Bruits. Bowel sounds: normal  RECTAL: N/A GU: N/A  EXTREMETIES: nonedematous.  MUSCULOSKELETAL:  Spine: normal Joints: intact  NEUROLOGIC: oriented to person; right hemiparesis, right upper extremity affected more than the lower extremity. Ambulatory without a cane or walker.  Results for orders placed in visit on 06/20/13  CMP14+EGFR      Result Value Ref Range   Glucose 107 (*) 65 - 99 mg/dL   BUN 10  6 - 24 mg/dL   Creatinine, Ser 0.70 (*) 0.76 - 1.27 mg/dL   GFR calc non Af Amer 103  >59 mL/min/1.73   GFR calc Af Amer 119  >59 mL/min/1.73   BUN/Creatinine Ratio 14  9 - 20   Sodium 130 (*) 134 - 144 mmol/L   Potassium 5.0  3.5 - 5.2 mmol/L   Chloride 88 (*) 97 - 108 mmol/L   CO2 24  18 - 29 mmol/L   Calcium 9.7  8.7 - 10.2 mg/dL   Total Protein 7.5  6.0 - 8.5 g/dL   Albumin 5.2  3.5 - 5.5 g/dL   Globulin, Total 2.3  1.5 - 4.5 g/dL   Albumin/Globulin Ratio 2.3  1.1 - 2.5   Total Bilirubin 0.3  0.0 - 1.2 mg/dL   Alkaline Phosphatase 68  39 - 117 IU/L   AST 22  0 - 40 IU/L   ALT 18  0 - 44 IU/L  FOLATE      Result Value Ref Range   Folate >19.9  >3.0 ng/mL  VITAMIN B12      Result Value Ref Range   Vitamin B-12 704  211 - 946 pg/mL  CARBAMAZEPINE LEVEL, FREE      Result Value  Ref Range   Carbamazepine, Free 1.9  0.6 - 4.2 ug/mL  CARBAMAZEPINE LEVEL, TOTAL      Result Value Ref Range   Carbamazepine  Lvl 8.1  4.0 - 12.0 ug/mL  NMR, LIPOPROFILE      Result Value Ref Range   LDL Particle Number [id number]  <1000 nmol/L   LDLC SERPL CALC-MCNC 57  <100 mg/dL   HDL Cholesterol by NMR 64  >=40 mg/dL   Triglycerides by NMR 88  <150 mg/dL   Cholesterol 139  <200 mg/dL   HDL Particle Number [id number]  >=30.5 umol/L   Small LDL Particle Number 412  <=527 nmol/L   LDL Size 20.7  >20.5 nm   LP-IR Score 38  <=45  THYROID PANEL WITH TSH      Result Value Ref Range   TSH 1.520  0.450 - 4.500 uIU/mL   T4, Total 6.8  4.5 - 12.0 ug/dL   T3 Uptake Ratio 30  24 - 39 %   Free Thyroxine Index 2.0  1.2 - 4.9  POCT CBC      Result Value Ref Range   WBC 8.2  4.6 - 10.2 K/uL   Lymph, poc 2.0  0.6 - 3.4   POC LYMPH PERCENT 24.3  10 - 50 %L   POC Granulocyte 5.9  2 - 6.9   Granulocyte percent 72.4  37 - 80 %G   RBC 4.7  4.69 - 6.13 M/uL   Hemoglobin 14.1  14.1 - 18.1 g/dL   HCT, POC 45.2  43.5 - 53.7 %   MCV 96.8  80 - 97 fL   MCH, POC 30.3  27 - 31.2 pg   MCHC 31.3 (*) 31.8 - 35.4 g/dL   RDW, POC 12.9     Platelet Count, POC 289.0  142 - 424 K/uL   MPV 6.7  0 - 99.8 fL     ASSESSMENT: Seizure disorder - Plan: risperiDONE (RISPERDAL) 1 MG tablet, Folate, Vitamin B12  TOBACCO ABUSE  SEIZURE DISORDER  OBSTRUCTIVE SLEEP APNEA  Loss of weight  HYPERTENSION - Plan: CMP14+EGFR  HYPERLIPIDEMIA - Plan: NMR, lipoprofile, CMP14+EGFR  CEREBRAL ANEURYSM  CAD (coronary artery disease) - Plan: CBC With differential/Platelet  C O P D  RLS (restless legs syndrome)  HEADACHE, CHRONIC  ANXIETY  PLAN: Checked with referrals to expedite the Ct abdomen and pelvis. Wean off Tegretol under Dr Cristi Loron supervision Reviewed report in regards to the sleep study and nothing stating that patient had sleep apnea. Family to check with Dr Arsenio Katz at follo wup visit in regards to sleep  study. Not appropriate to order CPap when no report supports it that is available to me.  Smoking cessation counselled.  Orders Placed This Encounter  Procedures  . Folate  . Vitamin B12  . NMR, lipoprofile  . CMP14+EGFR  . CBC With differential/Platelet   Meds ordered this encounter  Medications  . albuterol (PROVENTIL) (2.5 MG/3ML) 0.083% nebulizer solution    Sig: USE 1 VIAL IN NEBULIZER 4 TIMES A DAY AS NEEDED    Dispense:  375 mL    Refill:  4  . ALPRAZolam (XANAX) 1 MG tablet    Sig: TAKE  (1)  TABLET  THREE TIMES DAILY.    Dispense:  90 tablet    Refill:  2  . DULoxetine (CYMBALTA) 60 MG capsule    Sig: TAKE (1) CAPSULE DAILY    Dispense:  90 capsule    Refill:  3  . esomeprazole (NEXIUM) 40 MG capsule    Sig: TAKE (1) CAPSULE DAILY    Dispense:  90 capsule    Refill:  3  . niacin (NIASPAN) 1000  MG CR tablet    Sig: TAKE ONE TABLET AT BEDTIME    Dispense:  90 tablet    Refill:  3  . pramipexole (MIRAPEX) 0.5 MG tablet    Sig: TAKE ONE TABLET AT BEDTIME    Dispense:  90 tablet    Refill:  3  . risperiDONE (RISPERDAL) 1 MG tablet    Sig: Take 1 tablet (1 mg total) by mouth 2 (two) times daily.    Dispense:  90 tablet    Refill:  1  . simvastatin (ZOCOR) 40 MG tablet    Sig: TAKE ONE TABLET AT BEDTIME    Dispense:  90 tablet    Refill:  3  . metoprolol succinate (TOPROL XL) 25 MG 24 hr tablet    Sig: TAKE 1 TABLET DAILY    Dispense:  90 tablet    Refill:  3  . solifenacin (VESICARE) 5 MG tablet    Sig: TAKE (2) TABLETS DAILY    Dispense:  180 tablet    Refill:  3   Medications Discontinued During This Encounter  Medication Reason  . pramipexole (MIRAPEX) 0.25 MG tablet Dose change  . albuterol (PROVENTIL) (2.5 MG/3ML) 0.083% nebulizer solution Reorder  . ALPRAZolam (XANAX) 1 MG tablet Reorder  . DULoxetine (CYMBALTA) 60 MG capsule Reorder  . NEXIUM 40 MG capsule Reorder  . NIASPAN 1000 MG CR tablet Reorder  . pramipexole (MIRAPEX) 0.5 MG tablet  Reorder  . risperiDONE (RISPERDAL) 1 MG tablet Reorder  . simvastatin (ZOCOR) 40 MG tablet Reorder  . TOPROL XL 25 MG 24 hr tablet Reorder  . VESICARE 5 MG tablet Reorder   Return in about 3 months (around 01/02/2014) for Recheck medical problems.  Alexza Norbeck P. Jacelyn Grip, M.D.

## 2013-10-05 LAB — CBC WITH DIFFERENTIAL
Basophils Absolute: 0.1 10*3/uL (ref 0.0–0.2)
Basos: 1 %
Eos: 3 %
Eosinophils Absolute: 0.2 10*3/uL (ref 0.0–0.4)
HCT: 39.1 % (ref 37.5–51.0)
Hemoglobin: 13.3 g/dL (ref 12.6–17.7)
Immature Grans (Abs): 0 10*3/uL (ref 0.0–0.1)
Immature Granulocytes: 0 %
Lymphocytes Absolute: 1.8 10*3/uL (ref 0.7–3.1)
Lymphs: 24 %
MCH: 33 pg (ref 26.6–33.0)
MCHC: 34 g/dL (ref 31.5–35.7)
MCV: 97 fL (ref 79–97)
Monocytes Absolute: 0.7 10*3/uL (ref 0.1–0.9)
Monocytes: 9 %
Neutrophils Absolute: 4.9 10*3/uL (ref 1.4–7.0)
Neutrophils Relative %: 63 %
Platelets: 316 10*3/uL (ref 150–379)
RBC: 4.03 x10E6/uL — ABNORMAL LOW (ref 4.14–5.80)
RDW: 13.4 % (ref 12.3–15.4)
WBC: 7.7 10*3/uL (ref 3.4–10.8)

## 2013-10-05 LAB — CMP14+EGFR
ALT: 16 IU/L (ref 0–44)
AST: 18 IU/L (ref 0–40)
Albumin/Globulin Ratio: 2 (ref 1.1–2.5)
Albumin: 4.6 g/dL (ref 3.5–5.5)
Alkaline Phosphatase: 79 IU/L (ref 39–117)
BUN/Creatinine Ratio: 13 (ref 9–20)
BUN: 9 mg/dL (ref 6–24)
CO2: 25 mmol/L (ref 18–29)
Calcium: 9.6 mg/dL (ref 8.7–10.2)
Chloride: 91 mmol/L — ABNORMAL LOW (ref 97–108)
Creatinine, Ser: 0.72 mg/dL — ABNORMAL LOW (ref 0.76–1.27)
GFR calc Af Amer: 118 mL/min/{1.73_m2} (ref >59)
GFR calc non Af Amer: 102 mL/min/{1.73_m2} (ref >59)
Globulin, Total: 2.3 g/dL (ref 1.5–4.5)
Glucose: 92 mg/dL (ref 65–99)
Potassium: 5 mmol/L (ref 3.5–5.2)
Sodium: 132 mmol/L — ABNORMAL LOW (ref 134–144)
Total Bilirubin: <0.2 mg/dL (ref 0.0–1.2)
Total Protein: 6.9 g/dL (ref 6.0–8.5)

## 2013-10-05 LAB — VITAMIN B12: Vitamin B-12: 732 pg/mL (ref 211–946)

## 2013-10-05 LAB — NMR, LIPOPROFILE
Cholesterol: 129 mg/dL (ref <200)
HDL Cholesterol by NMR: 64 mg/dL (ref >=40)
HDL Particle Number: 30 umol/L — ABNORMAL LOW (ref >=30.5)
LDL Particle Number: 373 nmol/L (ref <1000)
LDL Size: 21 nm (ref >20.5)
LDLC SERPL CALC-MCNC: 45 mg/dL (ref <100)
LP-IR Score: 51 — ABNORMAL HIGH (ref <=45)
Small LDL Particle Number: 147 nmol/L (ref <=527)
Triglycerides by NMR: 101 mg/dL (ref <150)

## 2013-10-05 LAB — FOLATE: Folate: >19.9 ng/mL (ref >3.0)

## 2013-10-09 ENCOUNTER — Other Ambulatory Visit: Payer: Self-pay | Admitting: Family Medicine

## 2013-10-10 ENCOUNTER — Telehealth: Payer: Self-pay | Admitting: Family Medicine

## 2013-10-12 DIAGNOSIS — I609 Nontraumatic subarachnoid hemorrhage, unspecified: Secondary | ICD-10-CM

## 2013-10-12 DIAGNOSIS — F3289 Other specified depressive episodes: Secondary | ICD-10-CM

## 2013-10-12 DIAGNOSIS — F329 Major depressive disorder, single episode, unspecified: Secondary | ICD-10-CM

## 2013-10-13 ENCOUNTER — Other Ambulatory Visit: Payer: Self-pay | Admitting: *Deleted

## 2013-10-13 DIAGNOSIS — J449 Chronic obstructive pulmonary disease, unspecified: Secondary | ICD-10-CM

## 2013-12-12 DIAGNOSIS — I609 Nontraumatic subarachnoid hemorrhage, unspecified: Secondary | ICD-10-CM

## 2013-12-12 DIAGNOSIS — F3289 Other specified depressive episodes: Secondary | ICD-10-CM

## 2013-12-12 DIAGNOSIS — F329 Major depressive disorder, single episode, unspecified: Secondary | ICD-10-CM

## 2014-01-02 ENCOUNTER — Other Ambulatory Visit: Payer: Self-pay | Admitting: *Deleted

## 2014-01-02 MED ORDER — ALPRAZOLAM 1 MG PO TABS: ORAL_TABLET | ORAL | Status: DC

## 2014-01-02 NOTE — Telephone Encounter (Signed)
Patient of Dr Modesto CharonWong. Last seen in office on 10-03-13. Has an upcoming appt with you on 01-08-14. Rx last filled on 12-04-13 for #90. Please advise. If approved please route to Pool A so nurse can phone in to pharmacy

## 2014-01-02 NOTE — Telephone Encounter (Signed)
Script for xanax called in. 

## 2014-01-08 ENCOUNTER — Ambulatory Visit (INDEPENDENT_AMBULATORY_CARE_PROVIDER_SITE_OTHER): Payer: Medicaid Other | Admitting: Family

## 2014-01-08 ENCOUNTER — Encounter: Payer: Self-pay | Admitting: Family

## 2014-01-08 VITALS — BP 84/63 | HR 78 | Temp 98.1°F | Ht 67.0 in | Wt 150.0 lb

## 2014-01-08 DIAGNOSIS — F3289 Other specified depressive episodes: Secondary | ICD-10-CM

## 2014-01-08 DIAGNOSIS — F329 Major depressive disorder, single episode, unspecified: Secondary | ICD-10-CM

## 2014-01-08 DIAGNOSIS — K2100 Gastro-esophageal reflux disease with esophagitis, without bleeding: Secondary | ICD-10-CM

## 2014-01-08 DIAGNOSIS — R634 Abnormal weight loss: Secondary | ICD-10-CM

## 2014-01-08 DIAGNOSIS — L8992 Pressure ulcer of unspecified site, stage 2: Secondary | ICD-10-CM

## 2014-01-08 DIAGNOSIS — Z125 Encounter for screening for malignant neoplasm of prostate: Secondary | ICD-10-CM

## 2014-01-08 DIAGNOSIS — I952 Hypotension due to drugs: Secondary | ICD-10-CM

## 2014-01-08 DIAGNOSIS — T50904A Poisoning by unspecified drugs, medicaments and biological substances, undetermined, initial encounter: Secondary | ICD-10-CM

## 2014-01-08 DIAGNOSIS — I9589 Other hypotension: Secondary | ICD-10-CM

## 2014-01-08 DIAGNOSIS — E785 Hyperlipidemia, unspecified: Secondary | ICD-10-CM

## 2014-01-08 DIAGNOSIS — F411 Generalized anxiety disorder: Secondary | ICD-10-CM

## 2014-01-08 DIAGNOSIS — L8990 Pressure ulcer of unspecified site, unspecified stage: Secondary | ICD-10-CM

## 2014-01-08 DIAGNOSIS — J449 Chronic obstructive pulmonary disease, unspecified: Secondary | ICD-10-CM

## 2014-01-08 DIAGNOSIS — G40909 Epilepsy, unspecified, not intractable, without status epilepticus: Secondary | ICD-10-CM

## 2014-01-08 DIAGNOSIS — Z1321 Encounter for screening for nutritional disorder: Secondary | ICD-10-CM

## 2014-01-08 MED ORDER — ALBUTEROL SULFATE HFA 108 (90 BASE) MCG/ACT IN AERS: 2.0000 | INHALATION_SPRAY | Freq: Four times a day (QID) | RESPIRATORY_TRACT | Status: DC | PRN

## 2014-01-08 MED ORDER — ALPRAZOLAM 1 MG PO TABS: ORAL_TABLET | ORAL | Status: DC

## 2014-01-08 MED ORDER — MENTHOL-ZINC OXIDE 0.44-20.625 % EX OINT: TOPICAL_OINTMENT | CUTANEOUS | Status: DC

## 2014-01-08 NOTE — Patient Instructions (Signed)

## 2014-01-08 NOTE — Progress Notes (Signed)
Subjective:    Patient ID: [name], male    DOB: 26-Jul-1955, 58 y.o.   MRN: [id number]  Hyperlipidemia This is a chronic problem. The current episode started more than 1 year ago. The problem is controlled. Recent lipid tests were reviewed and are normal. He has no history of diabetes or hypothyroidism. Factors aggravating his hyperlipidemia include smoking. Associated symptoms include shortness of breath. Pertinent negatives include no focal sensory loss, leg pain or myalgias. Current antihyperlipidemic treatment includes statins and nicotinic acid. The current treatment provides significant improvement of lipids. There are no compliance problems.  Risk factors for coronary artery disease include dyslipidemia, family history and male sex.  Hypertension This is a chronic problem. The current episode started more than 1 year ago. The problem has been resolved since onset. The problem is controlled. Associated symptoms include anxiety and shortness of breath. Pertinent negatives include no headaches, malaise/fatigue, palpitations or peripheral edema. Risk factors for coronary artery disease include dyslipidemia, family history, male gender and smoking/tobacco exposure. Past treatments include beta blockers. The current treatment provides significant improvement. Compliance problems include exercise.  Hypertensive end-organ damage includes CAD/MI. There is no history of kidney disease, heart failure or a thyroid problem. Identifiable causes of hypertension include sleep apnea.  Gastrophageal Reflux He reports no belching, no coughing, no heartburn or no sore throat. This is a chronic problem. The current episode started more than 1 year ago. The problem occurs rarely. The problem has been resolved. The symptoms are aggravated by certain foods. Pertinent negatives include no muscle weakness. He has tried a PPI for the symptoms. The treatment provided significant relief.  Anxiety Presents for  follow-up visit. Symptoms include excessive worry, nervous/anxious behavior and shortness of breath. Patient reports no depressed mood, insomnia, irritability or palpitations. Symptoms occur occasionally. The severity of symptoms is moderate. The quality of sleep is poor.   His past medical history is significant for anxiety/panic attacks and depression.    *Pt has hx of stroke over 5 years ago. Pt has right sided weakness associated with that CVA.   Review of Systems  Constitutional: Negative.  Negative for malaise/fatigue and irritability.  HENT: Negative.  Negative for sore throat.   Respiratory: Positive for shortness of breath. Negative for cough.   Cardiovascular: Negative.  Negative for palpitations.  Gastrointestinal: Negative.  Negative for heartburn.  Endocrine: Negative.   Genitourinary: Negative.   Musculoskeletal: Negative.  Negative for muscle weakness and myalgias.  Neurological: Negative.  Negative for headaches.  Hematological: Negative.   Psychiatric/Behavioral: The patient is nervous/anxious. The patient does not have insomnia.   All other systems reviewed and are negative.      Objective:   Physical Exam  Vitals reviewed. Constitutional: He is oriented to person, place, and time. He appears well-developed and well-nourished. No distress.  HENT:  Head: Normocephalic.  Right Ear: External ear normal.  Left Ear: External ear normal.  Nose: Nose normal.  Mouth/Throat: Oropharynx is clear and moist.  Eyes: Pupils are equal, round, and reactive to light. Right eye exhibits no discharge. Left eye exhibits no discharge.  Neck: Normal range of motion. Neck supple. No thyromegaly present.  Cardiovascular: Normal rate, regular rhythm, normal heart sounds and intact distal pulses.   No murmur heard. Pulmonary/Chest: Effort normal and breath sounds normal. No respiratory distress. He has no wheezes.  Abdominal: Soft. Bowel sounds are normal. He exhibits no distension.  There is no tenderness.  Musculoskeletal: Normal range of motion. He exhibits no edema  and no tenderness.  Neurological: He is alert and oriented to person, place, and time. He has normal reflexes. No cranial nerve deficit.  Skin: Skin is warm and dry. No rash noted. There is erythema.  Stage 2 on coccyx area    Psychiatric: He has a normal mood and affect. His behavior is normal. Judgment and thought content normal.   BP 84/63  Pulse 78  Temp(Src) 98.1 F (36.7 C) (Oral)  Ht $R'5\' 7"'TA$  (1.702 m)  Wt 150 lb (68.04 kg)  BMI 23.49 kg/m2        Assessment & Plan:  1. Gastroesophageal reflux disease with esophagitis  2. ANXIETY - ALPRAZolam (XANAX) 1 MG tablet; TAKE  (1)  TABLET  THREE TIMES DAILY.  Dispense: 90 tablet; Refill: 1  3. Seizure disorder  4. C O P D  5. DEPRESSION  6. HYPERLIPIDEMIA - CMP14+EGFR - Lipid panel  7. Encounter for vitamin deficiency screening - Vit D  25 hydroxy (rtn osteoporosis monitoring)  8. Prostate cancer screening - PSA, total and free  9. Loss of weight - Thyroid Panel With TSH  10. Hypotension due to drugs Pt's metoprolol stopped b/c of hypotension  11. Pressure ulcer stage II -Calmoseptine ordered BID -Discussed changing positions every two hours -Pt to remove his wallet from back pocket   Continue all meds Labs pending Health Maintenance reviewed Diet and exercise encouraged RTO 3 months  Evelina Dun, FNP

## 2014-01-09 LAB — CMP14+EGFR
ALBUMIN: 4.4 g/dL (ref 3.5–5.5)
ALK PHOS: 69 IU/L (ref 39–117)
ALT: 15 IU/L (ref 0–44)
AST: 21 IU/L (ref 0–40)
Albumin/Globulin Ratio: 1.8 (ref 1.1–2.5)
BUN / CREAT RATIO: 10 (ref 9–20)
BUN: 9 mg/dL (ref 6–24)
CO2: 19 mmol/L (ref 18–29)
Calcium: 9.7 mg/dL (ref 8.7–10.2)
Chloride: 100 mmol/L (ref 97–108)
Creatinine, Ser: 0.91 mg/dL (ref 0.76–1.27)
GFR calc non Af Amer: 92 mL/min/{1.73_m2} (ref >59)
GFR, EST AFRICAN AMERICAN: 106 mL/min/{1.73_m2} (ref >59)
Globulin, Total: 2.4 g/dL (ref 1.5–4.5)
Glucose: 113 mg/dL — ABNORMAL HIGH (ref 65–99)
Potassium: 4.6 mmol/L (ref 3.5–5.2)
Sodium: 141 mmol/L (ref 134–144)
Total Bilirubin: 0.3 mg/dL (ref 0.0–1.2)
Total Protein: 6.8 g/dL (ref 6.0–8.5)

## 2014-01-09 LAB — PSA, TOTAL AND FREE
PSA FREE PCT: 17.4 %
PSA FREE: 0.33 ng/mL
PSA: 1.9 ng/mL (ref 0.0–4.0)

## 2014-01-09 LAB — LIPID PANEL
Chol/HDL Ratio: 2.1 ratio units (ref 0.0–5.0)
Cholesterol, Total: 102 mg/dL (ref 100–199)
HDL: 49 mg/dL (ref >39)
LDL Calculated: 40 mg/dL (ref 0–99)
Triglycerides: 63 mg/dL (ref 0–149)
VLDL Cholesterol Cal: 13 mg/dL (ref 5–40)

## 2014-01-09 LAB — THYROID PANEL WITH TSH
Free Thyroxine Index: 1.9 (ref 1.2–4.9)
T3 Uptake Ratio: 28 % (ref 24–39)
T4, Total: 6.7 ug/dL (ref 4.5–12.0)
TSH: 1.03 u[IU]/mL (ref 0.450–4.500)

## 2014-01-09 LAB — VITAMIN D 25 HYDROXY (VIT D DEFICIENCY, FRACTURES): VIT D 25 HYDROXY: 32 ng/mL (ref 30.0–100.0)

## 2014-01-29 ENCOUNTER — Other Ambulatory Visit: Payer: Self-pay | Admitting: *Deleted

## 2014-01-29 DIAGNOSIS — G40909 Epilepsy, unspecified, not intractable, without status epilepticus: Secondary | ICD-10-CM

## 2014-01-29 MED ORDER — RISPERIDONE 1 MG PO TABS: 1.0000 mg | ORAL_TABLET | Freq: Two times a day (BID) | ORAL | Status: DC

## 2014-01-29 NOTE — Telephone Encounter (Signed)
Last ov 7/15. Will put on file at pharmacy and fill on 02/02/14 [facility] other meds [facility].

## 2014-02-06 ENCOUNTER — Other Ambulatory Visit: Payer: Self-pay | Admitting: Family

## 2014-02-08 ENCOUNTER — Other Ambulatory Visit: Payer: Self-pay | Admitting: Family

## 2014-03-09 ENCOUNTER — Other Ambulatory Visit: Payer: Self-pay | Admitting: Family

## 2014-04-02 ENCOUNTER — Other Ambulatory Visit: Payer: Self-pay | Admitting: Family

## 2014-04-03 ENCOUNTER — Other Ambulatory Visit: Payer: Self-pay | Admitting: Family Medicine

## 2014-04-03 NOTE — Telephone Encounter (Signed)
Please call in xanax with 1 refills 

## 2014-04-03 NOTE — Telephone Encounter (Signed)
Last seen 01/08/14 Neysa BonitoChristy  If approved route to nurse [facility]

## 2014-04-11 ENCOUNTER — Encounter: Payer: Self-pay | Admitting: Family Medicine

## 2014-04-11 ENCOUNTER — Ambulatory Visit (INDEPENDENT_AMBULATORY_CARE_PROVIDER_SITE_OTHER): Payer: Medicaid Other | Admitting: Family Medicine

## 2014-04-11 VITALS — BP 104/72 | HR 65 | Temp 98.1°F | Ht 67.0 in | Wt 155.8 lb

## 2014-04-11 DIAGNOSIS — E785 Hyperlipidemia, unspecified: Secondary | ICD-10-CM

## 2014-04-11 DIAGNOSIS — F17200 Nicotine dependence, unspecified, uncomplicated: Secondary | ICD-10-CM

## 2014-04-11 DIAGNOSIS — G2581 Restless legs syndrome: Secondary | ICD-10-CM

## 2014-04-11 DIAGNOSIS — Z720 Tobacco use: Secondary | ICD-10-CM

## 2014-04-11 DIAGNOSIS — F411 Generalized anxiety disorder: Secondary | ICD-10-CM

## 2014-04-11 DIAGNOSIS — I10 Essential (primary) hypertension: Secondary | ICD-10-CM

## 2014-04-11 DIAGNOSIS — Z23 Encounter for immunization: Secondary | ICD-10-CM

## 2014-04-11 NOTE — Progress Notes (Signed)
   Subjective:    Patient ID: Patrick GrayerCharlie P Decook, male    DOB: March 12, 1956, 58 y.o.   MRN: [id number]  HPI 58 year old gentleman here to follow-up hyperlipidemia, COPD, restless legs, and late effect of CVA. Denies complaints today but his wife says he needs more Mirapex for restless legs. He is now at the highest dose and this should not be increased. Otherwise he tolerates lipid medicine. He uses albuterol inhaler and nebulizer regularly but continues to smoke. He also uses Vesicare for incontinence and wears depends since he had the CVA.    Review of Systems  Respiratory: Positive for shortness of breath.   Neurological: Positive for seizures and weakness.       Objective:   Physical Exam  Constitutional: He is oriented to person, place, and time. He appears well-developed and well-nourished.  HENT:  Head: Normocephalic.  Right Ear: External ear normal.  Left Ear: External ear normal.  Nose: Nose normal.  Mouth/Throat: Oropharynx is clear and moist.  Eyes: Conjunctivae and EOM are normal. Pupils are equal, round, and reactive to light.  Neck: Normal range of motion. Neck supple.  Cardiovascular: Normal rate, regular rhythm, normal heart sounds and intact distal pulses.   Pulmonary/Chest: Effort normal and breath sounds normal.  Abdominal: Soft. Bowel sounds are normal.  Musculoskeletal: Normal range of motion.  Neurological: He is alert and oriented to person, place, and time.  R side weaker and reflexes more brisk C/W CVA on that side  Skin: Skin is warm and dry.  Psychiatric: He has a normal mood and affect. His behavior is normal. Judgment and thought content normal.    BP 104/72  Pulse 65  Temp(Src) 98.1 F (36.7 C) (Oral)  Ht 5\' 7"  (1.702 m)  Wt 155 lb 12.8 oz (70.67 kg)  BMI 24.40 kg/m2      Assessment & Plan:  1. Encounter for immunization   2. Essential hypertension   3. RLS (restless legs syndrome) Cannot increase Mirapex any more  4. TOBACCO  ABUSE   5. Generalized anxiety disorder Continue major/minor tranqilizer  6. Hyperlipidemia   Frederica KusterStephen M Setsuko Robins MD

## 2014-04-13 ENCOUNTER — Ambulatory Visit: Payer: Medicaid Other | Admitting: Family Medicine

## 2014-04-13 DIAGNOSIS — I609 Nontraumatic subarachnoid hemorrhage, unspecified: Secondary | ICD-10-CM

## 2014-04-13 DIAGNOSIS — F329 Major depressive disorder, single episode, unspecified: Secondary | ICD-10-CM

## 2014-05-03 DIAGNOSIS — Z0289 Encounter for other administrative examinations: Secondary | ICD-10-CM

## 2014-05-14 ENCOUNTER — Telehealth: Payer: Self-pay | Admitting: Family Medicine

## 2014-05-28 ENCOUNTER — Other Ambulatory Visit: Payer: Self-pay | Admitting: Family

## 2014-05-29 NOTE — Telephone Encounter (Signed)
Last seen 04/11/14  Dr Hyacinth MeekerMiller  If approved route to nursing pool to be [facility]

## 2014-05-30 ENCOUNTER — Other Ambulatory Visit: Payer: Self-pay | Admitting: *Deleted

## 2014-05-30 MED ORDER — ALBUTEROL SULFATE (2.5 MG/3ML) 0.083% IN NEBU: INHALATION_SOLUTION | RESPIRATORY_TRACT | Status: DC

## 2014-06-14 ENCOUNTER — Ambulatory Visit (INDEPENDENT_AMBULATORY_CARE_PROVIDER_SITE_OTHER): Payer: Medicaid Other | Admitting: Family Medicine

## 2014-06-14 DIAGNOSIS — F329 Major depressive disorder, single episode, unspecified: Secondary | ICD-10-CM

## 2014-06-14 DIAGNOSIS — I609 Nontraumatic subarachnoid hemorrhage, unspecified: Secondary | ICD-10-CM

## 2014-07-02 ENCOUNTER — Other Ambulatory Visit: Payer: Self-pay | Admitting: Family

## 2014-07-02 ENCOUNTER — Other Ambulatory Visit: Payer: Self-pay | Admitting: Nurse Practitioner

## 2014-07-03 NOTE — Telephone Encounter (Signed)
Please route to nurse pool for prescription to be called in if approved.

## 2014-07-04 ENCOUNTER — Other Ambulatory Visit: Payer: Self-pay | Admitting: *Deleted

## 2014-07-04 NOTE — Telephone Encounter (Signed)
Xanax called to [facility]. 

## 2014-08-14 ENCOUNTER — Ambulatory Visit (INDEPENDENT_AMBULATORY_CARE_PROVIDER_SITE_OTHER): Payer: Medicaid Other | Admitting: Family Medicine

## 2014-08-14 ENCOUNTER — Encounter: Payer: Self-pay | Admitting: Family Medicine

## 2014-08-14 VITALS — BP 102/68 | HR 73 | Temp 97.2°F | Ht 67.0 in | Wt 162.0 lb

## 2014-08-14 DIAGNOSIS — I10 Essential (primary) hypertension: Secondary | ICD-10-CM | POA: Diagnosis not present

## 2014-08-14 DIAGNOSIS — G2581 Restless legs syndrome: Secondary | ICD-10-CM

## 2014-08-14 DIAGNOSIS — I671 Cerebral aneurysm, nonruptured: Secondary | ICD-10-CM

## 2014-08-14 MED ORDER — SIMVASTATIN 20 MG PO TABS: 20.0000 mg | ORAL_TABLET | Freq: Every day | ORAL | Status: DC

## 2014-08-14 NOTE — Progress Notes (Signed)
Subjective:    Patient ID: [name], male    DOB: 1956-05-18, 58 y.o.   MRN: [id number]  HPI 59 year old gentleman who is here to follow-up old CVA, hyperlipidemia, restless legs, and cognitive changes. His sister who accompanies him and answers most of the questions and controls his medicines does most of the talking. She requests again at this visit to increase Mirapex because he still has some restless leg symptoms. In reviewing his medicine he is on Risperdal which might be contributing to some of his extrapyramidal symptoms. Risperdal was started when he was initially hospitalized for the stroke and according to his sister he was put in some sort of cage because of combative and behaviors. We talked about discontinuation of the Risperdal and tapering to see if there is any difference in behaviors and the plan is to taper from 2 a day to 1 a day and then discontinue after 2 weeks   Patient Active Problem List   Diagnosis Date Noted  . Loss of weight 07/07/2013  . Frequent urination 11/04/2012  . Seizure disorder 11/04/2012  . Seizures 11/04/2012  . RLS (restless legs syndrome) 11/04/2012  . HLD (hyperlipidemia) 11/04/2012  . CAD (coronary artery disease) 11/04/2012  . DYSPHAGIA UNSPECIFIED 04/16/2009  . HEAD TRAUMA, CLOSED 04/16/2009  . Hyperlipidemia 06/05/2008  . Generalized anxiety disorder 06/05/2008  . ETHANOL ABUSE 06/05/2008  . TOBACCO ABUSE 06/05/2008  . DEPRESSION 06/05/2008  . OBSTRUCTIVE SLEEP APNEA 06/05/2008  . Essential hypertension 06/05/2008  . CEREBRAL ANEURYSM 06/05/2008  . C O P D 06/05/2008  . ASPIRATION PNEUMONIA 06/05/2008  . GERD 06/05/2008  . SEIZURE DISORDER 06/05/2008  . HEADACHE, CHRONIC 06/05/2008   Outpatient Encounter Prescriptions as of 08/14/2014  Medication Sig  . albuterol (PROVENTIL) (2.5 MG/3ML) 0.083% nebulizer solution USE 1 VIAL IN NEBULIZER 4 TIMES A DAY AS NEEDED  . ALPRAZolam (XANAX) 1 MG tablet TAKE  (1)  TABLET  THREE TIMES  DAILY.  . DULoxetine (CYMBALTA) 60 MG capsule TAKE (1) CAPSULE DAILY  . esomeprazole (NEXIUM) 40 MG capsule TAKE (1) CAPSULE DAILY  . niacin (NIASPAN) 1000 MG CR tablet TAKE ONE TABLET AT BEDTIME  . pramipexole (MIRAPEX) 0.5 MG tablet TAKE ONE TABLET AT BEDTIME  . PROAIR HFA 108 (90 BASE) MCG/ACT inhaler 2 PUFFS EVERY 6 HOURS AS NEEDED FOR WHEEZING OR SHORTNESS OF BREATH  . risperiDONE (RISPERDAL) 1 MG tablet TAKE 1 TABLET 2 TIMES A DAY  . simvastatin (ZOCOR) 40 MG tablet TAKE ONE TABLET AT BEDTIME  . solifenacin (VESICARE) 5 MG tablet TAKE (2) TABLETS DAILY  . Menthol-Zinc Oxide 0.44-20.625 % OINT Apply BID to area (Patient not taking: Reported on 08/14/2014)      Review of Systems  Constitutional: Negative.   HENT: Negative.   Eyes: Negative.   Respiratory: Negative.  Negative for shortness of breath.   Cardiovascular: Negative.  Negative for chest pain and leg swelling.  Gastrointestinal: Negative.   Genitourinary: Negative.   Musculoskeletal: Negative.   Skin: Negative.   Neurological: Positive for tremors and speech difficulty.  Psychiatric/Behavioral: Negative.   All other systems reviewed and are negative.      Objective:   Physical Exam  Cardiovascular: Normal rate and regular rhythm.   Pulmonary/Chest: Effort normal and breath sounds normal.  Neurological:  Some paralysis involving right side  Psychiatric: He has a normal mood and affect. His behavior is normal.    BP 102/68 mmHg  Pulse 73  Temp(Src) 97.2 F (36.2 C) (Oral)  Ht 5\' 7"  (1.702 m)  Wt 162 lb (73.483 kg)  BMI 25.37 kg/m2       Assessment & Plan:  1. Essential hypertension BP well controlled; no change needed  2. CEREBRAL ANEURYSM S/P CVA; stable  3. RLS (restless legs syndrome)  Taper risperadone  Frederica KusterStephen M Miller MD

## 2014-09-01 ENCOUNTER — Other Ambulatory Visit: Payer: Self-pay | Admitting: Family Medicine

## 2014-09-01 NOTE — Telephone Encounter (Signed)
Last filled 08/03/14, last seen 08/14/14. [facility]

## 2014-09-03 NOTE — Telephone Encounter (Signed)
Called to pharmacy 

## 2014-09-13 ENCOUNTER — Ambulatory Visit (INDEPENDENT_AMBULATORY_CARE_PROVIDER_SITE_OTHER): Payer: Medicaid Other | Admitting: Family Medicine

## 2014-09-13 DIAGNOSIS — I609 Nontraumatic subarachnoid hemorrhage, unspecified: Secondary | ICD-10-CM | POA: Diagnosis not present

## 2014-09-13 DIAGNOSIS — F329 Major depressive disorder, single episode, unspecified: Secondary | ICD-10-CM | POA: Diagnosis not present

## 2014-10-02 ENCOUNTER — Other Ambulatory Visit: Payer: Self-pay

## 2014-10-02 MED ORDER — ALBUTEROL SULFATE HFA 108 (90 BASE) MCG/ACT IN AERS: INHALATION_SPRAY | RESPIRATORY_TRACT | Status: DC

## 2014-10-02 MED ORDER — ESOMEPRAZOLE MAGNESIUM 40 MG PO CPDR: DELAYED_RELEASE_CAPSULE | ORAL | Status: DC

## 2014-10-02 MED ORDER — PRAMIPEXOLE DIHYDROCHLORIDE 0.5 MG PO TABS: ORAL_TABLET | ORAL | Status: DC

## 2014-10-02 MED ORDER — SOLIFENACIN SUCCINATE 5 MG PO TABS: ORAL_TABLET | ORAL | Status: DC

## 2014-10-02 MED ORDER — DULOXETINE HCL 60 MG PO CPEP: ORAL_CAPSULE | ORAL | Status: DC

## 2014-10-02 NOTE — Telephone Encounter (Signed)
Last seen 08/14/14 Dr Hyacinth MeekerMiller [facility]

## 2014-10-02 NOTE — Telephone Encounter (Signed)
Last seen 08/14/14 Dr Hyacinth MeekerMiller  Last lipid 01/08/14

## 2014-10-02 NOTE — Telephone Encounter (Signed)
[facility]

## 2014-10-04 ENCOUNTER — Other Ambulatory Visit: Payer: Self-pay

## 2014-10-04 MED ORDER — NIACIN ER (ANTIHYPERLIPIDEMIC) 1000 MG PO TBCR: EXTENDED_RELEASE_TABLET | ORAL | Status: DC

## 2014-10-04 NOTE — Telephone Encounter (Signed)
Last seen 09/05/14 Dr Hyacinth MeekerMiller  [facility]  Pharmacy sending over to fill

## 2014-10-05 MED ORDER — METOPROLOL SUCCINATE ER 25 MG PO TB24: 25.0000 mg | ORAL_TABLET | Freq: Every day | ORAL | Status: DC

## 2014-10-29 ENCOUNTER — Other Ambulatory Visit: Payer: Self-pay | Admitting: Family Medicine

## 2014-10-29 ENCOUNTER — Other Ambulatory Visit: Payer: Self-pay | Admitting: Family

## 2014-10-29 NOTE — Telephone Encounter (Signed)
Last seen 08/14/14 Dr Hyacinth MeekerMiller  Last lipid 01/08/14  If approved [facility]

## 2014-11-26 ENCOUNTER — Other Ambulatory Visit: Payer: Self-pay | Admitting: Family Medicine

## 2014-11-27 NOTE — Telephone Encounter (Signed)
Last seen 08/14/14 Dr Hyacinth Meeker  Last lipid 01/08/14  If approved route to nurse to call into [facility]

## 2014-11-27 NOTE — Telephone Encounter (Signed)
Maintenance meds approved and sent to pharmacy. Appt scheduled with Dr Hyacinth Meeker for 12/19/14. Routed to Dr Hyacinth Meeker to approve Xanax. Will need to be called in to pharmacy.

## 2014-11-27 NOTE — Telephone Encounter (Signed)
Last seen 08/14/14 Dr Miller 

## 2014-12-13 ENCOUNTER — Ambulatory Visit (INDEPENDENT_AMBULATORY_CARE_PROVIDER_SITE_OTHER): Payer: Medicaid Other | Admitting: Family Medicine

## 2014-12-13 DIAGNOSIS — F329 Major depressive disorder, single episode, unspecified: Secondary | ICD-10-CM

## 2014-12-13 DIAGNOSIS — I609 Nontraumatic subarachnoid hemorrhage, unspecified: Secondary | ICD-10-CM | POA: Diagnosis not present

## 2014-12-19 ENCOUNTER — Ambulatory Visit: Payer: Medicaid Other | Admitting: Family Medicine

## 2014-12-19 ENCOUNTER — Encounter: Payer: Self-pay | Admitting: Family Medicine

## 2014-12-19 ENCOUNTER — Ambulatory Visit (INDEPENDENT_AMBULATORY_CARE_PROVIDER_SITE_OTHER): Payer: Medicaid Other | Admitting: Family Medicine

## 2014-12-19 VITALS — BP 117/78 | HR 78 | Temp 97.3°F | Ht 67.0 in | Wt 169.0 lb

## 2014-12-19 DIAGNOSIS — G2581 Restless legs syndrome: Secondary | ICD-10-CM | POA: Diagnosis not present

## 2014-12-19 DIAGNOSIS — I10 Essential (primary) hypertension: Secondary | ICD-10-CM | POA: Diagnosis not present

## 2014-12-19 DIAGNOSIS — E785 Hyperlipidemia, unspecified: Secondary | ICD-10-CM | POA: Diagnosis not present

## 2014-12-19 NOTE — Progress Notes (Signed)
Subjective:    Patient ID: [name], male    DOB: 1956/04/15, 59 y.o.   MRN: [id number]  HPI 59 year old male with history of closed head trauma and ethanol abuse. He is accompanied by his sister who tells me he has had more problems with his legs jumping recently. He is on Mirapex for that. Also note that he is on Cymbalta and risperidone. There is a history of tardive dyskinesias. There is also history of seizure disorder and he had what appeared to be a seizure since his last visit here. Sister had been tapering anticonvulsives but restarted it after that episode. Weight loss has also been a problem and he has gained 7 pounds in the last 4 months with increased appetite.  Patient Active Problem List   Diagnosis Date Noted  . Loss of weight 07/07/2013  . Frequent urination 11/04/2012  . Seizure disorder 11/04/2012  . Seizures 11/04/2012  . RLS (restless legs syndrome) 11/04/2012  . HLD (hyperlipidemia) 11/04/2012  . CAD (coronary artery disease) 11/04/2012  . DYSPHAGIA UNSPECIFIED 04/16/2009  . HEAD TRAUMA, CLOSED 04/16/2009  . Hyperlipidemia 06/05/2008  . Generalized anxiety disorder 06/05/2008  . ETHANOL ABUSE 06/05/2008  . TOBACCO ABUSE 06/05/2008  . DEPRESSION 06/05/2008  . OBSTRUCTIVE SLEEP APNEA 06/05/2008  . Essential hypertension 06/05/2008  . CEREBRAL ANEURYSM 06/05/2008  . C O P D 06/05/2008  . ASPIRATION PNEUMONIA 06/05/2008  . GERD 06/05/2008  . SEIZURE DISORDER 06/05/2008  . HEADACHE, CHRONIC 06/05/2008   Outpatient Encounter Prescriptions as of 12/19/2014  Medication Sig  . albuterol (PROAIR HFA) 108 (90 BASE) MCG/ACT inhaler 2 PUFFS EVERY 6 HOURS AS NEEDED FOR WHEEZING OR SHORTNESS OF BREATH  . albuterol (PROVENTIL) (2.5 MG/3ML) 0.083% nebulizer solution USE 1 VIAL IN NEBULIZER 4 TIMES A DAY AS NEEDED  . ALPRAZolam (XANAX) 1 MG tablet TAKE  (1)  TABLET  THREE TIMES DAILY.  . DULoxetine (CYMBALTA) 60 MG capsule TAKE (1) CAPSULE DAILY  . esomeprazole  (NEXIUM) 40 MG capsule TAKE (1) CAPSULE DAILY  . metoprolol succinate (TOPROL-XL) 25 MG 24 hr tablet Take 1 tablet (25 mg total) by mouth daily.  . niacin (NIASPAN) 1000 MG CR tablet TAKE ONE TABLET AT BEDTIME  . pramipexole (MIRAPEX) 0.5 MG tablet TAKE ONE TABLET AT BEDTIME  . risperiDONE (RISPERDAL) 1 MG tablet TAKE 1 TABLET 2 TIMES A DAY  . simvastatin (ZOCOR) 20 MG tablet TAKE ONE TABLET AT BEDTIME  . VESICARE 5 MG tablet TAKE (2) TABLETS DAILY  . Menthol-Zinc Oxide 0.44-20.625 % OINT Apply BID to area (Patient not taking: Reported on 12/19/2014)   No facility-administered encounter medications on file as of 12/19/2014.       Review of Systems  Constitutional: Positive for unexpected weight change.  HENT: Negative.   Respiratory: Negative.   Cardiovascular: Negative.   Gastrointestinal: Negative.   Neurological: Positive for tremors.       Objective:   Physical Exam  Constitutional: He is oriented to person, place, and time. He appears well-developed and well-nourished.  HENT:  Head: Normocephalic.  Cardiovascular: Normal rate and regular rhythm.   Pulmonary/Chest: Effort normal and breath sounds normal.  Neurological: He is alert and oriented to person, place, and time.  Psychiatric: He has a normal mood and affect.     BP 117/78 mmHg  Pulse 78  Temp(Src) 97.3 F (36.3 C) (Oral)  Ht 5\' 7"  (1.702 m)  Wt 169 lb (76.658 kg)  BMI 26.46 kg/m2      Assessment &  Plan:  1. RLS (restless legs syndrome) Rather than increasing Mirapex will try to taper one at a time on Cymbalta and then Risperdal. If symptoms are not improved when I see him back in 3 months consider increasing Mirapex  2. HLD (hyperlipidemia) Lipids were at goal when checked one year ago need to repeat at next visit  3. Essential hypertension Pressure is well controlled on metoprolol. Continue same  Frederica Kuster MD

## 2014-12-31 ENCOUNTER — Other Ambulatory Visit: Payer: Self-pay | Admitting: Family Medicine

## 2014-12-31 NOTE — Telephone Encounter (Signed)
Last seen 12/19/14 Dr Hyacinth MeekerMiller  If approved route [facility] nurse [facility]

## 2015-01-01 ENCOUNTER — Other Ambulatory Visit: Payer: Self-pay | Admitting: Family Medicine

## 2015-01-01 NOTE — Telephone Encounter (Signed)
Last seen 12/19/14 Dr Hyacinth MeekerMiller  Last lipid 01/08/14  Requesting 90 day supply

## 2015-02-13 ENCOUNTER — Ambulatory Visit (INDEPENDENT_AMBULATORY_CARE_PROVIDER_SITE_OTHER): Payer: Medicaid Other | Admitting: Family Medicine

## 2015-02-13 DIAGNOSIS — F329 Major depressive disorder, single episode, unspecified: Secondary | ICD-10-CM

## 2015-02-13 DIAGNOSIS — I609 Nontraumatic subarachnoid hemorrhage, unspecified: Secondary | ICD-10-CM | POA: Diagnosis not present

## 2015-02-28 ENCOUNTER — Other Ambulatory Visit: Payer: Self-pay | Admitting: Family Medicine

## 2015-02-28 NOTE — Telephone Encounter (Signed)
Last seen 12/19/14 Dr Hyacinth Meeker  If approved route to nurse to call into [facility]

## 2015-02-28 NOTE — Telephone Encounter (Signed)
Refill called in to [facility]

## 2015-03-01 ENCOUNTER — Encounter: Payer: Self-pay | Admitting: Pediatrics

## 2015-03-01 ENCOUNTER — Ambulatory Visit (INDEPENDENT_AMBULATORY_CARE_PROVIDER_SITE_OTHER): Payer: Medicaid Other | Admitting: Pediatrics

## 2015-03-01 VITALS — BP 129/74 | HR 80 | Temp 97.5°F | Ht 67.0 in | Wt 170.6 lb

## 2015-03-01 DIAGNOSIS — Z720 Tobacco use: Secondary | ICD-10-CM | POA: Diagnosis not present

## 2015-03-01 DIAGNOSIS — I10 Essential (primary) hypertension: Secondary | ICD-10-CM

## 2015-03-01 DIAGNOSIS — L84 Corns and callosities: Secondary | ICD-10-CM | POA: Diagnosis not present

## 2015-03-01 DIAGNOSIS — F17200 Nicotine dependence, unspecified, uncomplicated: Secondary | ICD-10-CM

## 2015-03-01 NOTE — Patient Instructions (Signed)
Keep toe elevated Put something between toes but not on corn for several hours in the afternoon with your foot propped up.

## 2015-03-01 NOTE — Progress Notes (Signed)
Subjective:    Patient ID: [name], male    DOB: July 22, 1955, 59 y.o.   MRN: [id number]  CC: "blue toe"  HPI: [name] is a 59 y.o. male presenting on 03/01/2015 for toe discoloration.   He was seen two weeks ago and given instructions for corn care for a corn between his R 1st and 2nd toe, located on the medial side of second toe. He has been using donuts and corn remover liquid on the toe. Sometimes he stays in wet socks and shoes throughout the day per his wife. He stays active, on his feet much of the day, with yard care and feeding birds. He continues to smoke about one ppd. The area around the lesion on on the lesion is sore. He can wiggle his toe without pain.   He has otherwise been feeling well, no fevers.   Relevant past medical, surgical, family and social history reviewed and updated as indicated. Interim medical history since our last visit reviewed. Allergies and medications reviewed and updated.   ROS: Per HPI unless specifically indicated above  Past Medical History Patient Active Problem List   Diagnosis Date Noted  . Loss of weight 07/07/2013  . Frequent urination 11/04/2012  . Seizure disorder 11/04/2012  . Seizures 11/04/2012  . RLS (restless legs syndrome) 11/04/2012  . HLD (hyperlipidemia) 11/04/2012  . CAD (coronary artery disease) 11/04/2012  . DYSPHAGIA UNSPECIFIED 04/16/2009  . HEAD TRAUMA, CLOSED 04/16/2009  . Hyperlipidemia 06/05/2008  . Generalized anxiety disorder 06/05/2008  . History of alcohol abuse 06/05/2008  . TOBACCO ABUSE 06/05/2008  . DEPRESSION 06/05/2008  . OBSTRUCTIVE SLEEP APNEA 06/05/2008  . Essential hypertension 06/05/2008  . CEREBRAL ANEURYSM 06/05/2008  . C O P D 06/05/2008  . ASPIRATION PNEUMONIA 06/05/2008  . GERD 06/05/2008  . SEIZURE DISORDER 06/05/2008  . HEADACHE, CHRONIC 06/05/2008    Current Outpatient Prescriptions  Medication Sig Dispense Refill  . albuterol (PROVENTIL) (2.5 MG/3ML)  0.083% nebulizer solution USE 1 VIAL IN NEBULIZER 4 TIMES A DAY AS NEEDED 360 mL 3  . ALPRAZolam (XANAX) 1 MG tablet TAKE  (1)  TABLET  THREE TIMES DAILY. 90 tablet 0  . Menthol-Zinc Oxide 0.44-20.625 % OINT Apply BID to area 113 g 3  . metoprolol succinate (TOPROL-XL) 25 MG 24 hr tablet TAKE 1 TABLET DAILY 90 tablet 1  . NEXIUM 40 MG capsule TAKE (1) CAPSULE DAILY 30 capsule 5  . niacin (NIASPAN) 1000 MG CR tablet TAKE ONE TABLET AT BEDTIME 90 tablet 0  . pramipexole (MIRAPEX) 0.5 MG tablet TAKE ONE TABLET AT BEDTIME 30 tablet 2  . PROAIR HFA 108 (90 BASE) MCG/ACT inhaler 2 PUFFS EVERY 6 HOURS AS NEEDED FOR WHEEZING OR SHORTNESS OF BREATH 8.5 g 5  . risperiDONE (RISPERDAL) 1 MG tablet TAKE 1 TABLET 2 TIMES A DAY 60 tablet 2  . simvastatin (ZOCOR) 20 MG tablet TAKE ONE TABLET AT BEDTIME 30 tablet 5  . VESICARE 5 MG tablet TAKE (2) TABLETS DAILY 60 tablet 5   No current facility-administered medications for this visit.       Objective:    BP 129/74 mmHg  Pulse 80  Temp(Src) 97.5 F (36.4 C)  Ht  (1.702 m)  Wt 170 lb 9.6 oz (77.384 kg)  BMI 26.71 kg/m2  Wt Readings from Last 3 Encounters:  03/01/15 170 lb 9.6 oz (77.384 kg)  12/19/14 169 lb (76.658 kg)  08/14/14 162 lb (73.483 kg)  Gen: NAD, alert, cooperative with exam, NCAT EYES: EOMI, no scleral injection or icterus ENT:  TMs pearly gray b/l, OP without erythema LYMPH: no cervical LAD CV: NRRR, normal S1/S2, no murmur, R DP pulse is 2+ Resp: CTABL, no wheezes, normal WOB Abd: +BS, soft, NTND. no guarding or organomegaly Skin: R second toe with apprx 3mm lesion with central depression and raised border of apprx 1mm. No discharge, base is visible. R foot is warm and well-perfused, cap refill 2 sec on all toes. All R toes slightly ruddy appearing. Neuro: Alert, dysarthric speech MSK: normal muscle bulk     Assessment & Plan:   59yoM with multiple comorbidities presents with worsening pain around a corn on his R 2nd  toe. He has been using a corn break down solution on the corn, which appears to have broken down the central portion of the corn causing pain. No surrounding erythema. His R foot is neurovascularly intact with good cap refill, 2+ R DP pulse.  Recommend he not stay in wet shoes, change socks and shoes as soon as they get wet, try to use toe spacer or donut to offset pressure on corn, keep foot elevated when he is able to.   Diagnoses and all orders for this visit:  Corn of toe  TOBACCO ABUSE-- continue to smoke 1ppd. He is not interested in quitting at this time.   Essential HTN: Initial blood pressure elevated, on recheck was back down to 129/74. Continue current medications.  Follow up plan: Return if symptoms worsen or fail to improve.  Rex Kras, MD [facility] 03/01/2015, 5:48 PM

## 2015-03-12 ENCOUNTER — Encounter: Payer: Self-pay | Admitting: *Deleted

## 2015-03-15 ENCOUNTER — Ambulatory Visit (INDEPENDENT_AMBULATORY_CARE_PROVIDER_SITE_OTHER): Payer: Medicaid Other | Admitting: Family Medicine

## 2015-03-15 ENCOUNTER — Encounter: Payer: Self-pay | Admitting: Family Medicine

## 2015-03-15 VITALS — BP 124/77 | HR 81 | Temp 99.7°F | Ht 67.0 in | Wt 156.8 lb

## 2015-03-15 DIAGNOSIS — L84 Corns and callosities: Secondary | ICD-10-CM | POA: Diagnosis not present

## 2015-03-15 NOTE — Progress Notes (Signed)
BP 124/77 mmHg  Pulse 81  Temp(Src) 99.7 F (37.6 C) (Oral)  Ht 5' 7" (1.702 m)  Wt 156 lb 12.8 oz (71.124 kg)  BMI 24.55 kg/m2   Subjective:    Patient ID: [name], male    DOB: 1956-02-17, 59 y.o.   MRN: [id number]  HPI: [name] is a 59 y.o. male presenting on 03/15/2015 for Redness and swelling in toes of right foot   HPI Toe lesion Patient has had abnormal toes for quite a long time in his life and he has a pressure point between the first and second toe on his right foot. They were concerned because it is becoming more red than it usually is and the callus is becoming thicker. He uses moleskin between the 2 toes but it is not fully helping. He does have some pain with the pressure point that his 2 out of 10 when someone pushes on it but does not hurt at rest.  Relevant past medical, surgical, family and social history reviewed and updated as indicated. Interim medical history since our last visit reviewed. Allergies and medications reviewed and updated.  Review of Systems  Constitutional: Negative for fever.  HENT: Negative for ear discharge and ear pain.   Eyes: Negative for discharge and visual disturbance.  Respiratory: Negative for shortness of breath and wheezing.   Cardiovascular: Negative for chest pain and leg swelling.  Gastrointestinal: Negative for abdominal pain, diarrhea and constipation.  Genitourinary: Negative for difficulty urinating.  Musculoskeletal: Negative for back pain and gait problem.  Skin: Negative for rash.       Toe lesion or callus on the medial aspect of second toe on right foot with a little bit of redness in the skin surrounding it. No drainage  Neurological: Negative for syncope, light-headedness and headaches.  All other systems reviewed and are negative.   Per HPI unless specifically indicated above     Medication List       This list is accurate as of: 03/15/15  6:33 PM.  Always use your most recent med  list.               ALPRAZolam 1 MG tablet  Commonly known as:  XANAX  TAKE  (1)  TABLET  THREE TIMES DAILY.     Menthol-Zinc Oxide 0.44-20.625 % Oint  Apply BID to area     metoprolol succinate 25 MG 24 hr tablet  Commonly known as:  TOPROL-XL  TAKE 1 TABLET DAILY     NEXIUM 40 MG capsule  Generic drug:  esomeprazole  TAKE (1) CAPSULE DAILY     niacin 1000 MG CR tablet  Commonly known as:  NIASPAN  TAKE ONE TABLET AT BEDTIME     pramipexole 0.5 MG tablet  Commonly known as:  MIRAPEX  TAKE ONE TABLET AT BEDTIME     PROAIR HFA 108 (90 BASE) MCG/ACT inhaler  Generic drug:  albuterol  2 PUFFS EVERY 6 HOURS AS NEEDED FOR WHEEZING OR SHORTNESS OF BREATH     albuterol (2.5 MG/3ML) 0.083% nebulizer solution  Commonly known as:  PROVENTIL  USE 1 VIAL IN NEBULIZER 4 TIMES A DAY AS NEEDED     risperiDONE 1 MG tablet  Commonly known as:  RISPERDAL  TAKE 1 TABLET 2 TIMES A DAY     simvastatin 20 MG tablet  Commonly known as:  ZOCOR  TAKE ONE TABLET AT BEDTIME     VESICARE 5 MG tablet  Generic drug:  solifenacin  TAKE (2) TABLETS DAILY           Objective:    BP 124/77 mmHg  Pulse 81  Temp(Src) 99.7 F (37.6 C) (Oral)  Ht 5' 7" (1.702 m)  Wt 156 lb 12.8 oz (71.124 kg)  BMI 24.55 kg/m2  Wt Readings from Last 3 Encounters:  03/15/15 156 lb 12.8 oz (71.124 kg)  03/01/15 170 lb 9.6 oz (77.384 kg)  12/19/14 169 lb (76.658 kg)    Physical Exam  Constitutional: He is oriented to person, place, and time. He appears well-developed and well-nourished. No distress.  Eyes: Conjunctivae and EOM are normal. Right eye exhibits no discharge. No scleral icterus.  Cardiovascular: Normal rate, regular rhythm, normal heart sounds and intact distal pulses.   No murmur heard. Pulmonary/Chest: Effort normal and breath sounds normal. No respiratory distress. He has no wheezes.  Musculoskeletal: Normal range of motion. He exhibits no edema.  Neurological: He is alert and oriented  to person, place, and time. Coordination normal.  Skin: Skin is warm and dry. No rash noted. He is not diaphoretic.     Psychiatric: He has a normal mood and affect. His behavior is normal.  Vitals reviewed.   Results for orders placed or performed in visit on 01/08/14  CMP14+EGFR  Result Value Ref Range   Glucose 113 (H) 65 - 99 mg/dL   BUN 9 6 - 24 mg/dL   Creatinine, Ser 0.91 0.76 - 1.27 mg/dL   GFR calc non Af Amer 92 >59 mL/min/1.73   GFR calc Af Amer 106 >59 mL/min/1.73   BUN/Creatinine Ratio 10 9 - 20   Sodium 141 134 - 144 mmol/L   Potassium 4.6 3.5 - 5.2 mmol/L   Chloride 100 97 - 108 mmol/L   CO2 19 18 - 29 mmol/L   Calcium 9.7 8.7 - 10.2 mg/dL   Total Protein 6.8 6.0 - 8.5 g/dL   Albumin 4.4 3.5 - 5.5 g/dL   Globulin, Total 2.4 1.5 - 4.5 g/dL   Albumin/Globulin Ratio 1.8 1.1 - 2.5   Total Bilirubin 0.3 0.0 - 1.2 mg/dL   Alkaline Phosphatase 69 39 - 117 IU/L   AST 21 0 - 40 IU/L   ALT 15 0 - 44 IU/L  Lipid panel  Result Value Ref Range   Cholesterol, Total 102 100 - 199 mg/dL   Triglycerides 63 0 - 149 mg/dL   HDL 49 >39 mg/dL   VLDL Cholesterol Cal 13 5 - 40 mg/dL   LDL Calculated 40 0 - 99 mg/dL   Chol/HDL Ratio 2.1 0.0 - 5.0 ratio units  Vit D  25 hydroxy (rtn osteoporosis monitoring)  Result Value Ref Range   Vit D, 25-Hydroxy 32.0 30.0 - 100.0 ng/mL  PSA, total and free  Result Value Ref Range   PSA 1.9 0.0 - 4.0 ng/mL   PSA, Free 0.33 N/A ng/mL   PSA, Free Pct 17.4 %  Thyroid Panel With TSH  Result Value Ref Range   TSH 1.030 0.450 - 4.500 uIU/mL   T4, Total 6.7 4.5 - 12.0 ug/dL   T3 Uptake Ratio 28 24 - 39 %   Free Thyroxine Index 1.9 1.2 - 4.9      Assessment & Plan:       Problem List Items Addressed This Visit    None    Visit Diagnoses    Corn or callus    -  Primary    Patient has pressure point between first and   second toe on right foot. Use moleskin or toe separator        Follow up plan: Return if symptoms worsen or fail to  improve.  Joshua Dettinger, MD [facility] 03/15/2015, 6:33 PM      

## 2015-03-29 ENCOUNTER — Other Ambulatory Visit: Payer: Self-pay | Admitting: Family

## 2015-03-29 MED ORDER — ALPRAZOLAM 1 MG PO TABS: ORAL_TABLET | ORAL | Status: DC

## 2015-03-29 NOTE — Telephone Encounter (Signed)
Last filled 02/28/15, last seen 12/19/14. Millers pt[facility]

## 2015-03-29 NOTE — Telephone Encounter (Signed)
If this patient enough medication to last until Dr. Hyacinth MeekerMiller can refill it for him

## 2015-03-29 NOTE — Telephone Encounter (Signed)
Pt will run out tomorrow - miller not back until 04/02/15

## 2015-03-29 NOTE — Addendum Note (Signed)
Addended by: Magdalene RiverBULLINS, JAMIE H on: 03/29/2015 04:14 PM   Modules accepted: Orders

## 2015-04-01 ENCOUNTER — Other Ambulatory Visit: Payer: Self-pay | Admitting: Family Medicine

## 2015-04-01 ENCOUNTER — Other Ambulatory Visit: Payer: Self-pay | Admitting: Family

## 2015-04-01 NOTE — Telephone Encounter (Signed)
Last seen 7.16  Last filled 02/28/15  Dr Hyacinth MeekerMiller  If approved [facility]

## 2015-04-16 ENCOUNTER — Ambulatory Visit (INDEPENDENT_AMBULATORY_CARE_PROVIDER_SITE_OTHER): Payer: Medicaid Other | Admitting: Family Medicine

## 2015-04-16 ENCOUNTER — Encounter: Payer: Self-pay | Admitting: Family Medicine

## 2015-04-16 VITALS — BP 133/74 | HR 82 | Temp 97.6°F | Ht 67.0 in | Wt 169.4 lb

## 2015-04-16 DIAGNOSIS — I10 Essential (primary) hypertension: Secondary | ICD-10-CM | POA: Diagnosis not present

## 2015-04-16 DIAGNOSIS — E785 Hyperlipidemia, unspecified: Secondary | ICD-10-CM | POA: Diagnosis not present

## 2015-04-16 DIAGNOSIS — G40909 Epilepsy, unspecified, not intractable, without status epilepticus: Secondary | ICD-10-CM | POA: Diagnosis not present

## 2015-04-16 DIAGNOSIS — I671 Cerebral aneurysm, nonruptured: Secondary | ICD-10-CM | POA: Diagnosis not present

## 2015-04-16 DIAGNOSIS — Z23 Encounter for immunization: Secondary | ICD-10-CM | POA: Diagnosis not present

## 2015-04-16 NOTE — Progress Notes (Signed)
Subjective:    Patient ID: [name], male    DOB: 1956/03/30, 59 y.o.   MRN: [id number]  HPI 59 year old gentleman here to follow-up multiple problems including epilepsy, hyperlipidemia, restless leg, and overactive bladder. He does take risperidone and alprazolam for his nerves and is very docile and easy to get along with. He laughs a lot noon and in fact responses to questions are just laughter. His wife does most of the talking. She denies any symptoms. At his last visit, we stopped several medicines without any side effects. The Mirapex seems to control his symptoms of restless leg.  Patient Active Problem List   Diagnosis Date Noted  . Loss of weight 07/07/2013  . Frequent urination 11/04/2012  . Seizure disorder (HCC) 11/04/2012  . Seizures (HCC) 11/04/2012  . RLS (restless legs syndrome) 11/04/2012  . HLD (hyperlipidemia) 11/04/2012  . CAD (coronary artery disease) 11/04/2012  . DYSPHAGIA UNSPECIFIED 04/16/2009  . HEAD TRAUMA, CLOSED 04/16/2009  . Hyperlipidemia 06/05/2008  . Generalized anxiety disorder 06/05/2008  . History of alcohol abuse 06/05/2008  . TOBACCO ABUSE 06/05/2008  . DEPRESSION 06/05/2008  . OBSTRUCTIVE SLEEP APNEA 06/05/2008  . Essential hypertension 06/05/2008  . CEREBRAL ANEURYSM 06/05/2008  . C O P D 06/05/2008  . ASPIRATION PNEUMONIA 06/05/2008  . GERD 06/05/2008  . SEIZURE DISORDER 06/05/2008  . HEADACHE, CHRONIC 06/05/2008   Outpatient Encounter Prescriptions as of 04/16/2015  Medication Sig  . albuterol (PROVENTIL) (2.5 MG/3ML) 0.083% nebulizer solution USE 1 VIAL IN NEBULIZER 4 TIMES A DAY AS NEEDED  . ALPRAZolam (XANAX) 1 MG tablet TAKE  (1)  TABLET  THREE TIMES DAILY.  Marland Kitchen Menthol-Zinc Oxide 0.44-20.625 % OINT Apply BID to area  . NEXIUM 40 MG capsule TAKE (1) CAPSULE DAILY  . niacin (NIASPAN) 1000 MG CR tablet TAKE ONE TABLET AT BEDTIME  . pramipexole (MIRAPEX) 0.5 MG tablet TAKE ONE TABLET AT BEDTIME  . PROAIR HFA 108 (90 BASE)  MCG/ACT inhaler 2 PUFFS EVERY 6 HOURS AS NEEDED FOR WHEEZING OR SHORTNESS OF BREATH  . risperiDONE (RISPERDAL) 1 MG tablet TAKE 1 TABLET 2 TIMES A DAY  . simvastatin (ZOCOR) 20 MG tablet TAKE ONE TABLET AT BEDTIME  . VESICARE 5 MG tablet TAKE (2) TABLETS DAILY  . metoprolol succinate (TOPROL-XL) 25 MG 24 hr tablet TAKE 1 TABLET DAILY (Patient not taking: Reported on 03/15/2015)   No facility-administered encounter medications on file as of 04/16/2015.      Review of Systems  HENT: Negative.   Cardiovascular: Negative.   Genitourinary: Positive for frequency.  Neurological: Positive for weakness.  Psychiatric/Behavioral: Negative.        Objective:   Physical Exam  Constitutional: He is oriented to person, place, and time. He appears well-developed and well-nourished.  Walks with a limp  HENT:  Head: Normocephalic.  Eyes: Pupils are equal, round, and reactive to light.  Neck: Normal range of motion.  Cardiovascular: Normal rate, regular rhythm and normal heart sounds.   Pulmonary/Chest: Effort normal and breath sounds normal.  Neurological: He is alert and oriented to person, place, and time.  Psychiatric:  Laughter his response to many questions today.          Assessment & Plan:  1. Encounter for immunization Flu shot given   2. Essential hypertension Blood pressure is well controlled on metoprolol  3. CEREBRAL ANEURYSM I think at one time he must have had a CVA. His right side is weak and there are some mild contractures in  his right hand  4. Seizure disorder (HCC) No seizures have been seen. He is not on anticonvulsants but is on medicine for "nerves."  5. Hyperlipidemia He takes simvastatin as well as niacin and lipids are at goal.  Frederica KusterStephen M Miller MD

## 2015-04-22 ENCOUNTER — Ambulatory Visit: Payer: Medicaid Other | Admitting: Family Medicine

## 2015-04-30 ENCOUNTER — Other Ambulatory Visit: Payer: Self-pay | Admitting: Family

## 2015-04-30 NOTE — Telephone Encounter (Signed)
Refill called to [facility] 

## 2015-04-30 NOTE — Telephone Encounter (Signed)
Last filled 04/02/15, last seen 04/16/15, [facility]

## 2015-04-30 NOTE — Telephone Encounter (Signed)
Last seen 04/16/15 last filler 03/25/15 Xanax refill

## 2015-05-15 ENCOUNTER — Encounter (INDEPENDENT_AMBULATORY_CARE_PROVIDER_SITE_OTHER): Payer: Medicaid Other | Admitting: Family Medicine

## 2015-05-15 DIAGNOSIS — I609 Nontraumatic subarachnoid hemorrhage, unspecified: Secondary | ICD-10-CM | POA: Diagnosis not present

## 2015-05-15 DIAGNOSIS — F329 Major depressive disorder, single episode, unspecified: Secondary | ICD-10-CM

## 2015-05-31 ENCOUNTER — Other Ambulatory Visit: Payer: Self-pay | Admitting: Family Medicine

## 2015-07-01 ENCOUNTER — Other Ambulatory Visit: Payer: Self-pay | Admitting: Family Medicine

## 2015-07-02 NOTE — Telephone Encounter (Signed)
Last seen 04/16/15  Dr Hyacinth Meeker  Last lipid 01/08/14  If approved route to nurse to call into [facility]

## 2015-07-05 ENCOUNTER — Telehealth: Payer: Self-pay | Admitting: Family Medicine

## 2015-07-05 NOTE — Telephone Encounter (Signed)
done

## 2015-07-30 ENCOUNTER — Other Ambulatory Visit: Payer: Self-pay | Admitting: Family Medicine

## 2015-07-30 NOTE — Telephone Encounter (Signed)
Last seen 04/16/15  Dr Miller  If approved route to nurse to call into [facility] 

## 2015-07-30 NOTE — Telephone Encounter (Signed)
Called into [facility].  

## 2015-08-01 ENCOUNTER — Other Ambulatory Visit: Payer: Self-pay | Admitting: Family

## 2015-08-02 NOTE — Telephone Encounter (Signed)
No labs since 2015

## 2015-08-13 ENCOUNTER — Encounter (INDEPENDENT_AMBULATORY_CARE_PROVIDER_SITE_OTHER): Payer: Medicaid Other | Admitting: Family Medicine

## 2015-08-13 DIAGNOSIS — R32 Unspecified urinary incontinence: Secondary | ICD-10-CM

## 2015-08-13 DIAGNOSIS — I69061 Other paralytic syndrome following nontraumatic subarachnoid hemorrhage affecting right dominant side: Secondary | ICD-10-CM | POA: Diagnosis not present

## 2015-08-13 DIAGNOSIS — F329 Major depressive disorder, single episode, unspecified: Secondary | ICD-10-CM | POA: Diagnosis not present

## 2015-08-15 ENCOUNTER — Encounter: Payer: Self-pay | Admitting: Family Medicine

## 2015-08-15 ENCOUNTER — Ambulatory Visit (INDEPENDENT_AMBULATORY_CARE_PROVIDER_SITE_OTHER): Payer: Medicaid Other | Admitting: Family Medicine

## 2015-08-15 VITALS — BP 136/77 | HR 71 | Temp 96.8°F | Ht 67.0 in | Wt 165.0 lb

## 2015-08-15 DIAGNOSIS — I671 Cerebral aneurysm, nonruptured: Secondary | ICD-10-CM

## 2015-08-15 DIAGNOSIS — E785 Hyperlipidemia, unspecified: Secondary | ICD-10-CM

## 2015-08-15 DIAGNOSIS — I10 Essential (primary) hypertension: Secondary | ICD-10-CM

## 2015-08-15 DIAGNOSIS — Z0000 Encounter for general adult medical examination without abnormal findings: Secondary | ICD-10-CM

## 2015-08-15 DIAGNOSIS — F411 Generalized anxiety disorder: Secondary | ICD-10-CM | POA: Diagnosis not present

## 2015-08-15 DIAGNOSIS — G40909 Epilepsy, unspecified, not intractable, without status epilepticus: Secondary | ICD-10-CM

## 2015-08-15 NOTE — Progress Notes (Signed)
Subjective:    Patient ID: [name], male    DOB: 01-Sep-1955, 60 y.o.   MRN: [id number]  HPI Pt here for follow up and management of chronic medical problems which includes hypertension, hyperlipidemia, and seizure disorder. He is taking medications regularly.  Accompanied by sister who is his caretaker. She is requesting that we increase dose of Mirapex as he still has restlessness in his right leg. He continues to smoke heavily. Weight is stable. He has some dizziness in the mornings but no history of falls.     Patient Active Problem List   Diagnosis Date Noted  . Loss of weight 07/07/2013  . Frequent urination 11/04/2012  . Seizure disorder ([place]) 11/04/2012  . Seizures ([place]) 11/04/2012  . RLS (restless legs syndrome) 11/04/2012  . HLD (hyperlipidemia) 11/04/2012  . CAD (coronary artery disease) 11/04/2012  . DYSPHAGIA UNSPECIFIED 04/16/2009  . HEAD TRAUMA, CLOSED 04/16/2009  . Hyperlipidemia 06/05/2008  . Generalized anxiety disorder 06/05/2008  . History of alcohol abuse 06/05/2008  . TOBACCO ABUSE 06/05/2008  . DEPRESSION 06/05/2008  . OBSTRUCTIVE SLEEP APNEA 06/05/2008  . Essential hypertension 06/05/2008  . CEREBRAL ANEURYSM 06/05/2008  . C O P D 06/05/2008  . ASPIRATION PNEUMONIA 06/05/2008  . GERD 06/05/2008  . SEIZURE DISORDER 06/05/2008  . HEADACHE, CHRONIC 06/05/2008   Outpatient Encounter Prescriptions as of 08/15/2015  Medication Sig  . albuterol (PROVENTIL) (2.5 MG/3ML) 0.083% nebulizer solution USE 1 VIAL IN NEBULIZER 4 TIMES A DAY AS NEEDED  . ALPRAZolam (XANAX) 1 MG tablet TAKE  (1)  TABLET  THREE TIMES DAILY.  Marland Kitchen Menthol-Zinc Oxide 0.44-20.625 % OINT Apply BID to area  . metoprolol succinate (TOPROL-XL) 25 MG 24 hr tablet TAKE 1 TABLET DAILY  . NEXIUM 40 MG capsule TAKE (1) CAPSULE DAILY  . niacin (NIASPAN) 1000 MG CR tablet TAKE ONE TABLET AT BEDTIME  . pramipexole (MIRAPEX) 0.5 MG tablet TAKE ONE TABLET AT BEDTIME  . PROAIR HFA 108 (90  Base) MCG/ACT inhaler 2 PUFFS EVERY 6 HOURS AS NEEDED FOR WHEEZING OR SHORTNESS OF BREATH  . risperiDONE (RISPERDAL) 1 MG tablet TAKE 1 TABLET 2 TIMES A DAY  . simvastatin (ZOCOR) 20 MG tablet TAKE ONE TABLET AT BEDTIME  . VESICARE 5 MG tablet TAKE (2) TABLETS DAILY   No facility-administered encounter medications on file as of 08/15/2015.      Review of Systems  Constitutional: Negative.   HENT: Negative.   Eyes: Negative.   Respiratory: Negative.   Cardiovascular: Negative.   Gastrointestinal: Negative.   Endocrine: Negative.   Genitourinary: Negative.   Musculoskeletal: Negative.   Skin: Negative.   Allergic/Immunologic: Negative.   Neurological: Positive for dizziness.  Hematological: Negative.   Psychiatric/Behavioral: Negative.        Objective:   Physical Exam  Constitutional: He is oriented to person, place, and time. He appears well-developed and well-nourished.  Cardiovascular: Normal rate and regular rhythm.   Pulmonary/Chest: Effort normal and breath sounds normal.  Neurological: He is alert and oriented to person, place, and time. He exhibits abnormal muscle tone.  Psychiatric: Thought content normal.   BP 136/77 mmHg  Pulse 71  Temp(Src) 96.8 F (36 C) (Oral)  Ht '5\' 7"'$  (1.702 m)  Wt 165 lb (74.844 kg)  BMI 25.84 kg/m2        Assessment & Plan:  1. Essential hypertension Pressure is adequately controlled. Continue current medicine - CMP14+EGFR  2. Hyperlipidemia We will continue to monitor Lipids were at goal when last  checked - Lipid panel  3. Seizure disorder ([place]) No recent seizures. I believe patient is off medicine now  4. CEREBRAL ANEURYSM Patient had stroke and has some residual deformity and spasticity in his right upper and lower extremities  5. Generalized anxiety disorder Symptoms are managed with alprazolam  6. Health care maintenance Except for smoking. Patient is doing well. Will check labs - CMP14+EGFR - Lipid panel - PSA,  total and free  Wardell Honour MD

## 2015-08-15 NOTE — Patient Instructions (Signed)
Medicare Annual Wellness Visit  [place] and the medical providers at [facility] strive to bring you the best medical care.  In doing so we not only want to address your current medical conditions and concerns but also to detect new conditions early and prevent illness, disease and health-related problems.    Medicare offers a yearly Wellness Visit which allows our clinical staff to assess your need for preventative services including immunizations, lifestyle education, counseling to decrease risk of preventable diseases and screening for fall risk and other medical concerns.    This visit is provided free of charge (no copay) for all Medicare recipients. The clinical pharmacists at [facility] have begun to conduct these Wellness Visits which will also include a thorough review of all your medications.    As you primary medical provider recommend that you make an appointment for your Annual Wellness Visit if you have not done so already this year.  You may set up this appointment before you leave today or you may call back ([phone]) and schedule an appointment.  Please make sure when you call that you mention that you are scheduling your Annual Wellness Visit with the clinical pharmacist so that the appointment may be made for the proper length of time.     Continue current medications. Continue good therapeutic lifestyle changes which include good diet and exercise. Fall precautions discussed with patient. If an FOBT was given today- please return it to our front desk. If you are over 50 years old - you may need Prevnar 13 or the adult Pneumonia vaccine.  **Flu shots are available--- please call and schedule a FLU-CLINIC appointment**  After your visit with us today you will receive a survey in the mail or online from [employer] regarding your care with us. Please take a moment to fill this out. Your feedback is very  important to us as you can help us better understand your patient needs as well as improve your experience and satisfaction. WE CARE ABOUT YOU!!!    

## 2015-08-16 LAB — PSA, TOTAL AND FREE
PSA, Free Pct: 17.9 %
PSA, Free: 0.52 ng/mL
Prostate Specific Ag, Serum: 2.9 ng/mL (ref 0.0–4.0)

## 2015-08-16 LAB — CMP14+EGFR
ALK PHOS: 70 IU/L (ref 39–117)
ALT: 24 IU/L (ref 0–44)
AST: 23 IU/L (ref 0–40)
Albumin/Globulin Ratio: 1.7 (ref 1.1–2.5)
Albumin: 4.5 g/dL (ref 3.6–4.8)
BUN/Creatinine Ratio: 12 (ref 10–22)
BUN: 11 mg/dL (ref 8–27)
Bilirubin Total: 0.6 mg/dL (ref 0.0–1.2)
CO2: 23 mmol/L (ref 18–29)
CREATININE: 0.89 mg/dL (ref 0.76–1.27)
Calcium: 9.5 mg/dL (ref 8.6–10.2)
Chloride: 95 mmol/L — ABNORMAL LOW (ref 96–106)
GFR calc Af Amer: 107 mL/min/{1.73_m2} (ref >59)
GFR calc non Af Amer: 92 mL/min/{1.73_m2} (ref >59)
GLUCOSE: 96 mg/dL (ref 65–99)
Globulin, Total: 2.7 g/dL (ref 1.5–4.5)
Potassium: 4.3 mmol/L (ref 3.5–5.2)
Sodium: 135 mmol/L (ref 134–144)
Total Protein: 7.2 g/dL (ref 6.0–8.5)

## 2015-08-16 LAB — LIPID PANEL
CHOLESTEROL TOTAL: 114 mg/dL (ref 100–199)
Chol/HDL Ratio: 2.8 ratio units (ref 0.0–5.0)
HDL: 41 mg/dL (ref >39)
LDL CALC: 51 mg/dL (ref 0–99)
TRIGLYCERIDES: 111 mg/dL (ref 0–149)
VLDL CHOLESTEROL CAL: 22 mg/dL (ref 5–40)

## 2015-08-20 ENCOUNTER — Other Ambulatory Visit: Payer: Self-pay | Admitting: *Deleted

## 2015-08-20 MED ORDER — SOLIFENACIN SUCCINATE 5 MG PO TABS: ORAL_TABLET | ORAL | Status: DC

## 2015-08-20 MED ORDER — NIACIN ER (ANTIHYPERLIPIDEMIC) 1000 MG PO TBCR: 1000.0000 mg | EXTENDED_RELEASE_TABLET | Freq: Every day | ORAL | Status: DC

## 2015-08-20 MED ORDER — NEXIUM 40 MG PO CPDR: DELAYED_RELEASE_CAPSULE | ORAL | Status: DC

## 2015-08-20 MED ORDER — PRAMIPEXOLE DIHYDROCHLORIDE 0.5 MG PO TABS
0.5000 mg | ORAL_TABLET | Freq: Every day | ORAL | Status: DC
Start: 2015-08-20 — End: 2015-11-25

## 2015-08-20 MED ORDER — SIMVASTATIN 20 MG PO TABS: 20.0000 mg | ORAL_TABLET | Freq: Every day | ORAL | Status: DC

## 2015-08-30 ENCOUNTER — Other Ambulatory Visit: Payer: Self-pay | Admitting: Family

## 2015-09-26 ENCOUNTER — Other Ambulatory Visit: Payer: Self-pay | Admitting: Family Medicine

## 2015-09-26 NOTE — Telephone Encounter (Signed)
Last seen 08/15/15  Dr Hyacinth MeekerMiller  If approved [facility]

## 2015-09-30 ENCOUNTER — Other Ambulatory Visit: Payer: Self-pay | Admitting: Family Medicine

## 2015-09-30 NOTE — Telephone Encounter (Signed)
rx called into pharmacy

## 2015-09-30 NOTE — Telephone Encounter (Signed)
Patient aware that rx was already sent to pharmacy

## 2015-10-25 ENCOUNTER — Other Ambulatory Visit: Payer: Self-pay | Admitting: Family Medicine

## 2015-11-13 ENCOUNTER — Encounter (INDEPENDENT_AMBULATORY_CARE_PROVIDER_SITE_OTHER): Payer: Medicaid Other | Admitting: Family Medicine

## 2015-11-13 DIAGNOSIS — F329 Major depressive disorder, single episode, unspecified: Secondary | ICD-10-CM

## 2015-11-13 DIAGNOSIS — R32 Unspecified urinary incontinence: Secondary | ICD-10-CM | POA: Diagnosis not present

## 2015-11-13 DIAGNOSIS — I69061 Other paralytic syndrome following nontraumatic subarachnoid hemorrhage affecting right dominant side: Secondary | ICD-10-CM | POA: Diagnosis not present

## 2015-11-25 ENCOUNTER — Other Ambulatory Visit: Payer: Self-pay | Admitting: Family Medicine

## 2015-11-26 ENCOUNTER — Other Ambulatory Visit: Payer: Self-pay | Admitting: Family Medicine

## 2015-11-26 NOTE — Telephone Encounter (Signed)
Patient last seen in office on 08-15-15. Rx last filled on 09-26-15 for #90 with 1 RF. Please advise and route to Pool A so nurse can phone in to pharmacy

## 2015-12-19 ENCOUNTER — Telehealth: Payer: Self-pay

## 2015-12-19 ENCOUNTER — Encounter: Payer: Self-pay | Admitting: Family Medicine

## 2015-12-19 ENCOUNTER — Ambulatory Visit (INDEPENDENT_AMBULATORY_CARE_PROVIDER_SITE_OTHER): Payer: Medicaid Other | Admitting: Family Medicine

## 2015-12-19 VITALS — BP 116/70 | HR 85 | Temp 98.2°F | Ht 67.0 in | Wt 158.8 lb

## 2015-12-19 DIAGNOSIS — I10 Essential (primary) hypertension: Secondary | ICD-10-CM | POA: Diagnosis not present

## 2015-12-19 DIAGNOSIS — G40909 Epilepsy, unspecified, not intractable, without status epilepticus: Secondary | ICD-10-CM | POA: Diagnosis not present

## 2015-12-19 DIAGNOSIS — G2581 Restless legs syndrome: Secondary | ICD-10-CM

## 2015-12-19 MED ORDER — RISPERIDONE 2 MG PO TABS: 2.0000 mg | ORAL_TABLET | Freq: Every day | ORAL | Status: DC

## 2015-12-19 MED ORDER — MENTHOL-ZINC OXIDE 0.44-20.625 % EX OINT: TOPICAL_OINTMENT | CUTANEOUS | Status: DC

## 2015-12-19 MED ORDER — ALPRAZOLAM 1 MG PO TABS: ORAL_TABLET | ORAL | Status: DC

## 2015-12-19 NOTE — Progress Notes (Signed)
Subjective:    Patient ID: Patrick GrayerCharlie P Cathey, male    DOB: [birth year], 60 y.o.   MRN: [id number]  HPI 60 year old who is here to follow-up multiple chronic problems. He continues to smoke but has cut down some. Family reports increased agitation with short temper. He has been on Risperdal 1 mg twice a day. He also reports some stiffness in his right knee. There is been no swelling or injury. Weight has decreased 7 pounds since last visit. Appetite is reported okay.  Patient Active Problem List   Diagnosis Date Noted  . Loss of weight 07/07/2013  . Frequent urination 11/04/2012  . Seizure disorder (HCC) 11/04/2012  . Seizures (HCC) 11/04/2012  . RLS (restless legs syndrome) 11/04/2012  . HLD (hyperlipidemia) 11/04/2012  . CAD (coronary artery disease) 11/04/2012  . DYSPHAGIA UNSPECIFIED 04/16/2009  . HEAD TRAUMA, CLOSED 04/16/2009  . Hyperlipidemia 06/05/2008  . Generalized anxiety disorder 06/05/2008  . History of alcohol abuse 06/05/2008  . TOBACCO ABUSE 06/05/2008  . DEPRESSION 06/05/2008  . OBSTRUCTIVE SLEEP APNEA 06/05/2008  . Essential hypertension 06/05/2008  . CEREBRAL ANEURYSM 06/05/2008  . C O P D 06/05/2008  . ASPIRATION PNEUMONIA 06/05/2008  . GERD 06/05/2008  . SEIZURE DISORDER 06/05/2008  . HEADACHE, CHRONIC 06/05/2008   Outpatient Encounter Prescriptions as of 12/19/2015  Medication Sig  . albuterol (PROVENTIL) (2.5 MG/3ML) 0.083% nebulizer solution USE 1 VIAL IN NEBULIZER 4 TIMES A DAY AS NEEDED  . ALPRAZolam (XANAX) 1 MG tablet TAKE  (1)  TABLET  THREE TIMES DAILY.  Marland Kitchen. Menthol-Zinc Oxide 0.44-20.625 % OINT Apply BID to area  . metoprolol succinate (TOPROL-XL) 25 MG 24 hr tablet TAKE 1 TABLET DAILY  . NEXIUM 40 MG capsule TAKE (1) CAPSULE DAILY  . NIASPAN 1000 MG CR tablet Take 1 tablet (1,000 mg total) by mouth at bedtime.  . pramipexole (MIRAPEX) 0.5 MG tablet Take 1 tablet (0.5 mg total) by mouth at bedtime.  Marland Kitchen. PROAIR HFA 108 (90 Base) MCG/ACT inhaler 2  PUFFS EVERY 6 HOURS AS NEEDED FOR WHEEZING OR SHORTNESS OF BREATH  . risperiDONE (RISPERDAL) 1 MG tablet TAKE 1 TABLET 2 TIMES A DAY  . simvastatin (ZOCOR) 20 MG tablet Take 1 tablet (20 mg total) by mouth at bedtime.  . solifenacin (VESICARE) 5 MG tablet TAKE (2) TABLETS DAILY   No facility-administered encounter medications on file as of 12/19/2015.      Review of Systems  Constitutional: Positive for unexpected weight change.  HENT: Negative.   Respiratory: Negative.   Cardiovascular: Negative.   Gastrointestinal: Negative.   Neurological: Negative.   Psychiatric/Behavioral: Negative.        Objective:   Physical Exam  Constitutional: He appears well-developed and well-nourished.  Cardiovascular: Normal rate, regular rhythm and normal heart sounds.   Pulmonary/Chest: Effort normal and breath sounds normal.  Abdominal: Soft.  Musculoskeletal:  Right knee: Is no effusion or tenderness to palpation. Knee is stable to stress testing.  Neurological: He is alert.   BP 116/70 mmHg  Pulse 85  Temp(Src) 98.2 F (36.8 C) (Oral)  Ht 5\' 7"  (1.702 m)  Wt 158 lb 12.8 oz (72.031 kg)  BMI 24.87 kg/m2        Assessment & Plan:  1. RLS (restless legs syndrome) Patient continues on Mirapex and symptoms are fairly well controlled  2. Seizure disorder [facility](HCC) Patient is off anticonvulsants and there have been no recent seizures  3. Essential hypertension Blood pressure is well controlled on current regimen of metoprolol  For increased agitation have recommended increasing risperidone from 1 mg twice a day to 2 mg twice a day   Frederica KusterStephen M Devota Viruet MD

## 2015-12-19 NOTE — Telephone Encounter (Signed)
Insurance prior authorized Risperidone  [id number]

## 2015-12-26 ENCOUNTER — Other Ambulatory Visit: Payer: Self-pay | Admitting: Family Medicine

## 2016-01-13 ENCOUNTER — Telehealth: Payer: Self-pay | Admitting: Family Medicine

## 2016-01-13 ENCOUNTER — Ambulatory Visit (INDEPENDENT_AMBULATORY_CARE_PROVIDER_SITE_OTHER): Payer: Medicaid Other | Admitting: Family Medicine

## 2016-01-13 DIAGNOSIS — R32 Unspecified urinary incontinence: Secondary | ICD-10-CM | POA: Diagnosis not present

## 2016-01-13 DIAGNOSIS — F329 Major depressive disorder, single episode, unspecified: Secondary | ICD-10-CM | POA: Diagnosis not present

## 2016-01-13 DIAGNOSIS — I69061 Other paralytic syndrome following nontraumatic subarachnoid hemorrhage affecting right dominant side: Secondary | ICD-10-CM

## 2016-01-14 MED ORDER — ALPRAZOLAM 1 MG PO TABS: ORAL_TABLET | ORAL | 0 refills | Status: DC

## 2016-01-14 NOTE — Telephone Encounter (Signed)
Rx called in as approved.

## 2016-01-14 NOTE — Telephone Encounter (Signed)
Please okay 1 refill on his Xanax

## 2016-01-24 ENCOUNTER — Other Ambulatory Visit: Payer: Self-pay | Admitting: Family Medicine

## 2016-02-10 ENCOUNTER — Other Ambulatory Visit: Payer: Self-pay | Admitting: Family Medicine

## 2016-02-24 ENCOUNTER — Other Ambulatory Visit: Payer: Self-pay | Admitting: Family Medicine

## 2016-03-12 ENCOUNTER — Other Ambulatory Visit: Payer: Self-pay | Admitting: Family Medicine

## 2016-03-14 ENCOUNTER — Ambulatory Visit (INDEPENDENT_AMBULATORY_CARE_PROVIDER_SITE_OTHER): Payer: Medicaid Other | Admitting: Family Medicine

## 2016-03-14 DIAGNOSIS — I69061 Other paralytic syndrome following nontraumatic subarachnoid hemorrhage affecting right dominant side: Secondary | ICD-10-CM | POA: Diagnosis not present

## 2016-03-14 DIAGNOSIS — R32 Unspecified urinary incontinence: Secondary | ICD-10-CM | POA: Diagnosis not present

## 2016-03-14 DIAGNOSIS — F329 Major depressive disorder, single episode, unspecified: Secondary | ICD-10-CM | POA: Diagnosis not present

## 2016-03-24 ENCOUNTER — Other Ambulatory Visit: Payer: Self-pay | Admitting: Family Medicine

## 2016-03-24 NOTE — Telephone Encounter (Signed)
Last filled 02/24/16, last seen 12/19/15. [facility]

## 2016-03-24 NOTE — Telephone Encounter (Signed)
Refill called to [facility] 

## 2016-04-09 ENCOUNTER — Other Ambulatory Visit: Payer: Self-pay | Admitting: Family Medicine

## 2016-04-22 ENCOUNTER — Other Ambulatory Visit: Payer: Self-pay | Admitting: Family Medicine

## 2016-04-22 NOTE — Telephone Encounter (Signed)
Refill called to [facility] 

## 2016-04-22 NOTE — Telephone Encounter (Signed)
Seen miller - 7/17 Last filled  - 03-24-16 Stacks is coverage for miller  Have pool nurse call in

## 2016-04-29 ENCOUNTER — Emergency Department (HOSPITAL_COMMUNITY)
Admission: EM | Admit: 2016-04-29 | Discharge: 2016-04-29 | Disposition: A | Discharge status: Home / Self Care | Payer: Medicaid Other | Attending: Emergency Medicine | Admitting: Emergency Medicine

## 2016-04-29 ENCOUNTER — Encounter (HOSPITAL_COMMUNITY): Payer: Self-pay

## 2016-04-29 ENCOUNTER — Emergency Department (HOSPITAL_COMMUNITY): Payer: Medicaid Other

## 2016-04-29 DIAGNOSIS — F17200 Nicotine dependence, unspecified, uncomplicated: Secondary | ICD-10-CM | POA: Diagnosis not present

## 2016-04-29 DIAGNOSIS — I2510 Atherosclerotic heart disease of native coronary artery without angina pectoris: Secondary | ICD-10-CM | POA: Diagnosis not present

## 2016-04-29 DIAGNOSIS — B9789 Other viral agents as the cause of diseases classified elsewhere: Secondary | ICD-10-CM

## 2016-04-29 DIAGNOSIS — J069 Acute upper respiratory infection, unspecified: Secondary | ICD-10-CM | POA: Insufficient documentation

## 2016-04-29 DIAGNOSIS — Z79899 Other long term (current) drug therapy: Secondary | ICD-10-CM | POA: Insufficient documentation

## 2016-04-29 DIAGNOSIS — J449 Chronic obstructive pulmonary disease, unspecified: Secondary | ICD-10-CM | POA: Insufficient documentation

## 2016-04-29 DIAGNOSIS — I10 Essential (primary) hypertension: Secondary | ICD-10-CM | POA: Diagnosis not present

## 2016-04-29 DIAGNOSIS — R0602 Shortness of breath: Secondary | ICD-10-CM | POA: Diagnosis present

## 2016-04-29 LAB — CBC WITH DIFFERENTIAL/PLATELET
BASOS ABS: 0 10*3/uL (ref 0.0–0.1)
Basophils Relative: 0 %
Eosinophils Absolute: 0.1 10*3/uL (ref 0.0–0.7)
Eosinophils Relative: 1 %
HCT: 36.7 % — ABNORMAL LOW (ref 39.0–52.0)
HEMOGLOBIN: 12.7 g/dL — AB (ref 13.0–17.0)
LYMPHS ABS: 2.2 10*3/uL (ref 0.7–4.0)
LYMPHS PCT: 22 %
MCH: 33.2 pg (ref 26.0–34.0)
MCHC: 34.6 g/dL (ref 30.0–36.0)
MCV: 95.8 fL (ref 78.0–100.0)
Monocytes Absolute: 1 10*3/uL (ref 0.1–1.0)
Monocytes Relative: 10 %
NEUTROS ABS: 6.8 10*3/uL (ref 1.7–7.7)
NEUTROS PCT: 67 %
Platelets: 188 10*3/uL (ref 150–400)
RBC: 3.83 MIL/uL — AB (ref 4.22–5.81)
RDW: 12.7 % (ref 11.5–15.5)
WBC: 10.1 10*3/uL (ref 4.0–10.5)

## 2016-04-29 LAB — BASIC METABOLIC PANEL
ANION GAP: 7 (ref 5–15)
BUN: 8 mg/dL (ref 6–20)
CHLORIDE: 101 mmol/L (ref 101–111)
CO2: 26 mmol/L (ref 22–32)
Calcium: 8.8 mg/dL — ABNORMAL LOW (ref 8.9–10.3)
Creatinine, Ser: 0.84 mg/dL (ref 0.61–1.24)
GFR calc Af Amer: >60 mL/min (ref >60)
GLUCOSE: 114 mg/dL — AB (ref 65–99)
POTASSIUM: 3.5 mmol/L (ref 3.5–5.1)
SODIUM: 134 mmol/L — AB (ref 135–145)

## 2016-04-29 LAB — I-STAT CG4 LACTIC ACID, ED: LACTIC ACID, VENOUS: 0.91 mmol/L (ref 0.5–1.9)

## 2016-04-29 LAB — I-STAT TROPONIN, ED: Troponin i, poc: 0.02 ng/mL (ref 0.00–0.08)

## 2016-04-29 MED ORDER — FLUTICASONE PROPIONATE 50 MCG/ACT NA SUSP: 1.0000 | Freq: Every day | NASAL | 0 refills | Status: DC

## 2016-04-29 NOTE — ED Provider Notes (Signed)
[facility]-EMERGENCY DEPT Provider Note   CSN: [id number] Arrival date & time: 04/29/16  1032     History   Chief Complaint Chief Complaint  Patient presents with  . Shortness of Breath    HPI [name] is a 60 y.o. male.  HPI  60 y.o. male with a hx of COPD. HTN, HLD, presents to the Emergency Department today complaining of shortness of breath x 2 days. Notes fevers at home with TMax 102F. No N/V. No CP/ABD pain. Does not endorse pain currently. Notes decrease in PO intake as well as decrease in activity. No diaphoresis. No numbness/tingling. No dysuria. No other symptoms noted.   Past Medical History:  Diagnosis Date  . Anxiety   . Cerebral aneurysm   . Closed head injury   . COPD (chronic obstructive pulmonary disease) (HCC)   . Depression   . GERD (gastroesophageal reflux disease)   . Hyperlipidemia   . Hypertension   . Seizures (HCC)   . Sleep apnea     Patient Active Problem List   Diagnosis Date Noted  . Loss of weight 07/07/2013  . Frequent urination 11/04/2012  . Seizure disorder (HCC) 11/04/2012  . Seizures (HCC) 11/04/2012  . RLS (restless legs syndrome) 11/04/2012  . HLD (hyperlipidemia) 11/04/2012  . CAD (coronary artery disease) 11/04/2012  . DYSPHAGIA UNSPECIFIED 04/16/2009  . HEAD TRAUMA, CLOSED 04/16/2009  . Hyperlipidemia 06/05/2008  . Generalized anxiety disorder 06/05/2008  . History of alcohol abuse 06/05/2008  . TOBACCO ABUSE 06/05/2008  . DEPRESSION 06/05/2008  . OBSTRUCTIVE SLEEP APNEA 06/05/2008  . Essential hypertension 06/05/2008  . CEREBRAL ANEURYSM 06/05/2008  . C O P D 06/05/2008  . ASPIRATION PNEUMONIA 06/05/2008  . GERD 06/05/2008  . SEIZURE DISORDER 06/05/2008  . HEADACHE, CHRONIC 06/05/2008    History reviewed. No pertinent surgical history.     Home Medications    Prior to Admission medications   Medication Sig Start Date End Date Taking? Authorizing Provider  albuterol (PROVENTIL) (2.5 MG/3ML) 0.083%  nebulizer solution USE 1 VIAL IN NEBULIZER 4 TIMES A DAY AS NEEDED 01/27/16   Frederica Kuster, MD  ALPRAZolam Prudy Feeler) 1 MG tablet TAKE  (1)  TABLET  THREE TIMES DAILY. 04/22/16   Mechele Claude, MD  Menthol-Zinc Oxide 0.44-20.625 % OINT Apply BID to area 12/19/15   Frederica Kuster, MD  metoprolol succinate (TOPROL-XL) 25 MG 24 hr tablet TAKE 1 TABLET DAILY 03/24/16   Frederica Kuster, MD  NEXIUM 40 MG capsule TAKE (1) CAPSULE DAILY 12/26/15   Frederica Kuster, MD  NIASPAN 1000 MG CR tablet Take 1 tablet (1,000 mg total) by mouth at bedtime. 03/24/16   Frederica Kuster, MD  pramipexole (MIRAPEX) 0.5 MG tablet Take 1 tablet (0.5 mg total) by mouth at bedtime. 02/24/16   Ernestina Penna, MD  PROAIR HFA 108 [phone] Base) MCG/ACT inhaler 2 PUFFS EVERY 6 HOURS AS NEEDED FOR WHEEZING OR SHORTNESS OF BREATH 01/27/16   Frederica Kuster, MD  risperiDONE (RISPERDAL) 2 MG tablet TAKE ONE TABLET AT BEDTIME 04/09/16   Frederica Kuster, MD  simvastatin (ZOCOR) 20 MG tablet Take 1 tablet (20 mg total) by mouth at bedtime. 02/24/16   Ernestina Penna, MD  solifenacin (VESICARE) 5 MG tablet TAKE (2) TABLETS DAILY 08/20/15   Frederica Kuster, MD    Family History No family history on file.  Social History Social History  Substance Use Topics  . Smoking status: Current Every Day Smoker  Packs/day: 1.00  . Smokeless tobacco: Never Used  . Alcohol use No     Allergies   Clonidine   Review of Systems Review of Systems ROS reviewed and all are negative for acute change except as noted in the HPI.  Physical Exam Updated Vital Signs BP 130/81 (BP Location: Left Arm)   Pulse 95   Temp 97.7 F (36.5 C) (Oral)   Resp (!) 32   Ht 5\' 6"  (1.676 m)   Wt 74.4 kg   SpO2 93%   BMI 26.47 kg/m   Physical Exam  Constitutional: He is oriented to person, place, and time. Vital signs are normal. He appears well-developed and well-nourished.  HENT:  Head: Normocephalic.  Right Ear: Hearing normal.  Left Ear: Hearing  normal.  Eyes: Conjunctivae and EOM are normal. Pupils are equal, round, and reactive to light.  Neck: Normal range of motion. Neck supple.  Cardiovascular: Normal rate, regular rhythm, normal heart sounds and intact distal pulses.   No murmur heard. Pulmonary/Chest: Effort normal. No accessory muscle usage. No respiratory distress. He has no decreased breath sounds. He has no wheezes. He has rhonchi in the left lower field. He has no rales.  Musculoskeletal: Normal range of motion.  Neurological: He is alert and oriented to person, place, and time.  Skin: Skin is warm and dry.  Psychiatric: He has a normal mood and affect. His speech is normal and behavior is normal. Thought content normal.  Nursing note and vitals reviewed.  ED Treatments / Results  Labs (all labs ordered are listed, but only abnormal results are displayed) Labs Reviewed  CBC WITH DIFFERENTIAL/PLATELET - Abnormal; Notable for the following:       Result Value   RBC 3.83 (*)    Hemoglobin 12.7 (*)    HCT 36.7 (*)    All other components within normal limits  BASIC METABOLIC PANEL - Abnormal; Notable for the following:    Sodium 134 (*)    Glucose, Bld 114 (*)    Calcium 8.8 (*)    All other components within normal limits  I-STAT CG4 LACTIC ACID, ED  Rosezena SensorI-STAT TROPOININ, ED   EKG  EKG Interpretation  Date/Time:  Wednesday April 29 2016 10:46:24 EST Ventricular Rate:  97 PR Interval:    QRS Duration: 91 QT Interval:  354 QTC Calculation: 450 R Axis:   52 Text Interpretation:  Sinus rhythm Probable left atrial enlargement Anterior infarct, old Minimal ST depression, inferior leads Baseline wander When compared with ECG of 01/04/2008 Rate slower Confirmed by [facility] Center[facility]CMANUS  MD, Nicholos JohnsKATHLEEN [phone]([id number]) on 04/29/2016 10:49:45 AM       Radiology Dg Chest 2 View  Result Date: 04/29/2016 CLINICAL DATA:  Shortness of breath, fever. EXAM: CHEST  2 VIEW COMPARISON:  Radiographs of July 10, 2013. FINDINGS: The heart size  and mediastinal contours are within normal limits. Both lungs are clear. No pneumothorax or pleural effusion is noted. Atherosclerosis of thoracic aorta is noted. The visualized skeletal structures are unremarkable. IMPRESSION: No active cardiopulmonary disease.  Aortic atherosclerosis. Electronically Signed   By: Lupita RaiderJames  Green Jr, M.D.   On: 04/29/2016 11:39    Procedures Procedures (including critical care time)  Medications Ordered in ED Medications - No data to display   Initial Impression / Assessment and Plan / ED Course  I have reviewed the triage vital signs and the nursing notes.  Pertinent labs & imaging results that were available during my care of the patient were reviewed by me  and considered in my medical decision making (see chart for details).  Clinical Course    Final Clinical Impressions(s) / ED Diagnoses  I have reviewed and evaluated the relevant laboratory values I have reviewed and evaluated the relevant imaging studies.  I have interpreted the relevant EKG. I have reviewed the relevant previous healthcare records. I obtained HPI from historian. Patient discussed with supervising physician  ED Course:  Assessment: Pt is a 60yM with hx COPD, HTN, HLD who presents with shortness of breath x 2 days with Fever of Tmax 102F. No fever today, but did take tylenol PTA. On exam, pt in NAD. Nontoxic/nonseptic appearing. VSS. Afebrile. Lungs with rhonchi LLL. Heart RRR. Abdomen nontender soft. Istat Lactic negative. CBC with no leukocytosis. BMP unremarkable. Trop negative. EKG unremarkable. CXR unremarkable. Likely Viral URI. Plan is to DC home with follow up to PCP. At time of discharge, Patient is in no acute distress. Vital Signs are stable. Patient is able to ambulate. Patient able to tolerate PO.   Disposition/Plan:  DC Home Additional Verbal discharge instructions given and discussed with patient.  Pt Instructed to f/u with PCP in the next week for evaluation and  treatment of symptoms. Return precautions given Pt acknowledges and agrees with plan  Supervising Physician Samuel JesterKathleen McManus, DO   Final diagnoses:  Viral URI with cough    New Prescriptions New Prescriptions   No medications on file     Audry Piliyler Mayzie Caughlin, PA-C 04/29/16 1239    Samuel JesterKathleen McManus, DO 04/29/16 1620

## 2016-04-29 NOTE — ED Triage Notes (Signed)
Pt reports SOB that has gotten worse over the past 2 days and reports fever.

## 2016-04-29 NOTE — Discharge Instructions (Signed)
Please read and follow all provided instructions.  Your diagnoses today include:  1. Viral URI with cough    You appear to have an upper respiratory infection (URI). An upper respiratory tract infection, or cold, is a viral infection of the air passages leading to the lungs. It should improve gradually after 5-7 days. You may have a lingering cough that lasts for 2- 4 weeks after the infection.  Tests performed today include: Vital signs. See below for your results today.   Medications prescribed:   Take any prescribed medications only as directed. Treatment for your infection is aimed at treating the symptoms. There are no medications, such as antibiotics, that will cure your infection.   Home care instructions:  Follow any educational materials contained in this packet.   Your illness is contagious and can be spread to others, especially during the first 3 or 4 days. It cannot be cured by antibiotics or other medicines. Take basic precautions such as washing your hands often, covering your mouth when you cough or sneeze, and avoiding public places where you could spread your illness to others.   Please continue drinking plenty of fluids.  Use over-the-counter medicines as needed as directed on packaging for symptom relief.  You may also use ibuprofen or tylenol as directed on packaging for pain or fever.  Do not take multiple medicines containing Tylenol or acetaminophen to avoid taking too much of this medication.  Follow-up instructions: Please follow-up with your primary care provider in the next 3 days for further evaluation of your symptoms if you are not feeling better.   Return instructions:  Please return to the Emergency Department if you experience worsening symptoms.  RETURN IMMEDIATELY IF you develop shortness of breath, confusion or altered mental status, a new rash, become dizzy, faint, or poorly responsive, or are unable to be cared for at home. Please return if you have  persistent vomiting and cannot keep down fluids or develop a fever that is not controlled by tylenol or motrin.   Please return if you have any other emergent concerns.  Additional Information:  Your vital signs today were: BP 131/68    Pulse 81    Temp 97.7 F (36.5 C) (Oral)    Resp (!) 34    Ht 5\' 6"  (1.676 m)    Wt 74.4 kg    SpO2 96%    BMI 26.47 kg/m  If your blood pressure (BP) was elevated above 135/85 this visit, please have this repeated by your doctor within one month. --------------

## 2016-04-29 NOTE — ED Notes (Signed)
Pt walked to restroom with out difficulty. deneis sob. Sat after walked back was 98%

## 2016-04-29 NOTE — ED Notes (Signed)
Pt taken for Xray. Lesly Dukesachel J Everett, RN

## 2016-05-12 ENCOUNTER — Encounter: Payer: Self-pay | Admitting: Family Medicine

## 2016-05-12 ENCOUNTER — Ambulatory Visit (INDEPENDENT_AMBULATORY_CARE_PROVIDER_SITE_OTHER): Payer: Medicaid Other | Admitting: Family Medicine

## 2016-05-12 VITALS — BP 112/71 | HR 82 | Temp 97.0°F | Ht 66.0 in | Wt 161.0 lb

## 2016-05-12 DIAGNOSIS — I10 Essential (primary) hypertension: Secondary | ICD-10-CM

## 2016-05-12 DIAGNOSIS — G40909 Epilepsy, unspecified, not intractable, without status epilepticus: Secondary | ICD-10-CM

## 2016-05-12 DIAGNOSIS — Z23 Encounter for immunization: Secondary | ICD-10-CM

## 2016-05-12 MED ORDER — MUPIROCIN 2 % EX OINT: 1.0000 "application " | TOPICAL_OINTMENT | Freq: Two times a day (BID) | CUTANEOUS | 0 refills | Status: DC

## 2016-05-12 NOTE — Progress Notes (Signed)
Subjective:    Patient ID: [name], male    DOB: [birth year], 60 y.o.   MRN: [id number]  HPI 60 year old gentleman with history of closed head trauma with secondary seizure disorder, dysphagia, tobacco and alcohol abuse. He is followed by home health nurse who has some concerns today about some possible abscesses on his buttocks. He was recently seen in the emergency room for increasing head congestion. Evaluation there showed no evidence of lower respiratory infection or pneumonia.  Patient Active Problem List   Diagnosis Date Noted  . Loss of weight 07/07/2013  . Frequent urination 11/04/2012  . Seizure disorder (HCC) 11/04/2012  . Seizures (HCC) 11/04/2012  . RLS (restless legs syndrome) 11/04/2012  . HLD (hyperlipidemia) 11/04/2012  . CAD (coronary artery disease) 11/04/2012  . DYSPHAGIA UNSPECIFIED 04/16/2009  . HEAD TRAUMA, CLOSED 04/16/2009  . Hyperlipidemia 06/05/2008  . Generalized anxiety disorder 06/05/2008  . History of alcohol abuse 06/05/2008  . TOBACCO ABUSE 06/05/2008  . DEPRESSION 06/05/2008  . OBSTRUCTIVE SLEEP APNEA 06/05/2008  . Essential hypertension 06/05/2008  . CEREBRAL ANEURYSM 06/05/2008  . C O P D 06/05/2008  . ASPIRATION PNEUMONIA 06/05/2008  . GERD 06/05/2008  . SEIZURE DISORDER 06/05/2008  . HEADACHE, CHRONIC 06/05/2008   Outpatient Encounter Prescriptions as of 05/12/2016  Medication Sig  . albuterol (PROVENTIL) (2.5 MG/3ML) 0.083% nebulizer solution USE 1 VIAL IN NEBULIZER 4 TIMES A DAY AS NEEDED  . ALPRAZolam (XANAX) 1 MG tablet TAKE  (1)  TABLET  THREE TIMES DAILY.  . fluticasone (FLONASE) 50 MCG/ACT nasal spray Place 1 spray into both nostrils daily.  . Menthol-Zinc Oxide 0.44-20.625 % OINT Apply BID to area  . metoprolol succinate (TOPROL-XL) 25 MG 24 hr tablet TAKE 1 TABLET DAILY  . NEXIUM 40 MG capsule TAKE (1) CAPSULE DAILY  . NIASPAN 1000 MG CR tablet Take 1 tablet (1,000 mg total) by mouth at bedtime.  . pramipexole  (MIRAPEX) 0.5 MG tablet Take 1 tablet (0.5 mg total) by mouth at bedtime.  Marland Kitchen. PROAIR HFA 108 (90 Base) MCG/ACT inhaler 2 PUFFS EVERY 6 HOURS AS NEEDED FOR WHEEZING OR SHORTNESS OF BREATH  . risperiDONE (RISPERDAL) 2 MG tablet TAKE ONE TABLET AT BEDTIME  . simvastatin (ZOCOR) 20 MG tablet Take 1 tablet (20 mg total) by mouth at bedtime.  . solifenacin (VESICARE) 5 MG tablet TAKE (2) TABLETS DAILY   No facility-administered encounter medications on file as of 05/12/2016.       Review of Systems  Constitutional: Negative.   HENT: Positive for congestion.   Respiratory: Positive for cough.   Cardiovascular: Negative.   Neurological: Positive for seizures.  Psychiatric/Behavioral: The patient is nervous/anxious.        Objective:   Physical Exam  Constitutional: He is oriented to person, place, and time. He appears well-developed and well-nourished.  HENT:  Mouth/Throat: Oropharynx is clear and moist.  Cardiovascular: Normal rate, regular rhythm and normal heart sounds.   Pulmonary/Chest: Effort normal and breath sounds normal.  Neurological: He is alert and oriented to person, place, and time.  Skin:  There is no active infection or abscess on the buttocks. There are some chronic changes in the skin where there had been infection previously and some dermatitis that causes itching.  Psychiatric: He has a normal mood and affect. His behavior is normal.   BP 112/71   Pulse 82   Temp 97 F (36.1 C) (Oral)   Ht 5\' 6"  (1.676 m)   Wt 161 lb (73  kg)   BMI 25.99 kg/m         Assessment & Plan:  1. Essential hypertension Blood pressure is well controlled today 112/71. Continue on metoprolol   2. Seizure disorder (HCC) There've been no recent seizures. He is not on any anticonvulsants at this time but does take risperidone for behaviors.  Frederica KusterStephen M Miller MD

## 2016-05-13 DIAGNOSIS — Z23 Encounter for immunization: Secondary | ICD-10-CM | POA: Diagnosis not present

## 2016-05-13 MED ORDER — MUPIROCIN 2 % EX OINT: 1.0000 "application " | TOPICAL_OINTMENT | Freq: Two times a day (BID) | CUTANEOUS | 0 refills | Status: DC

## 2016-05-13 NOTE — Addendum Note (Signed)
Addended by: Fawn KirkHOLT, CATHY on: 05/13/2016 03:29 PM   Modules accepted: Orders

## 2016-05-18 ENCOUNTER — Telehealth: Payer: Self-pay | Admitting: Family Medicine

## 2016-05-18 MED ORDER — FLUTICASONE PROPIONATE 50 MCG/ACT NA SUSP
1.0000 | Freq: Every day | NASAL | 1 refills | Status: DC
Start: 2016-05-18 — End: 2016-07-06

## 2016-05-18 NOTE — Telephone Encounter (Signed)
done

## 2016-05-21 ENCOUNTER — Other Ambulatory Visit: Payer: Self-pay | Admitting: Family Medicine

## 2016-05-22 NOTE — Telephone Encounter (Signed)
[facility] filled 04/23/16, last seen 05/12/16. [facility]

## 2016-05-22 NOTE — Telephone Encounter (Signed)
Refill called to [facility] 

## 2016-06-02 ENCOUNTER — Other Ambulatory Visit: Payer: Self-pay | Admitting: Family Medicine

## 2016-06-10 ENCOUNTER — Other Ambulatory Visit: Payer: Self-pay | Admitting: Family Medicine

## 2016-06-14 ENCOUNTER — Ambulatory Visit (INDEPENDENT_AMBULATORY_CARE_PROVIDER_SITE_OTHER): Payer: Medicaid Other | Admitting: Family Medicine

## 2016-06-14 DIAGNOSIS — I69061 Other paralytic syndrome following nontraumatic subarachnoid hemorrhage affecting right dominant side: Secondary | ICD-10-CM | POA: Diagnosis not present

## 2016-06-14 DIAGNOSIS — F329 Major depressive disorder, single episode, unspecified: Secondary | ICD-10-CM | POA: Diagnosis not present

## 2016-06-14 DIAGNOSIS — R32 Unspecified urinary incontinence: Secondary | ICD-10-CM

## 2016-06-20 ENCOUNTER — Other Ambulatory Visit: Payer: Self-pay | Admitting: Family Medicine

## 2016-07-06 ENCOUNTER — Other Ambulatory Visit: Payer: Self-pay | Admitting: Family Medicine

## 2016-07-06 MED ORDER — FLUTICASONE PROPIONATE 50 MCG/ACT NA SUSP
1.0000 | Freq: Every day | NASAL | 6 refills | Status: DC
Start: 2016-07-06 — End: 2017-02-11

## 2016-07-06 NOTE — Telephone Encounter (Signed)
Med refilled --- need to get in touch with Azzie  - we need to let her know that Rayen will need an appt with Dr Hyacinth Meekermiller for this note.

## 2016-07-06 NOTE — Telephone Encounter (Signed)
appt made with Hyacinth Meekermiller - sister aware

## 2016-07-07 NOTE — Progress Notes (Signed)
Subjective:    Patient ID: [name], male    DOB: 27-Feb-1956, 60 y.o.   MRN: [id number]  HPI 61 year old gentleman here brought in by family member with concerns about memory. Patient has history of alcohol abuse, seizure disorder,COPD, and cerebral aneurysm. He has been forgetting to take medicines even though they have been counted out in a organized or. Patient has a strange affect and which she laughs inappropriately many times.  Patient Active Problem List   Diagnosis Date Noted  . Loss of weight 07/07/2013  . Frequent urination 11/04/2012  . Seizure disorder (HCC) 11/04/2012  . Seizures (HCC) 11/04/2012  . RLS (restless legs syndrome) 11/04/2012  . HLD (hyperlipidemia) 11/04/2012  . CAD (coronary artery disease) 11/04/2012  . DYSPHAGIA UNSPECIFIED 04/16/2009  . HEAD TRAUMA, CLOSED 04/16/2009  . Hyperlipidemia 06/05/2008  . Generalized anxiety disorder 06/05/2008  . History of alcohol abuse 06/05/2008  . TOBACCO ABUSE 06/05/2008  . DEPRESSION 06/05/2008  . OBSTRUCTIVE SLEEP APNEA 06/05/2008  . Essential hypertension 06/05/2008  . CEREBRAL ANEURYSM 06/05/2008  . C O P D 06/05/2008  . ASPIRATION PNEUMONIA 06/05/2008  . GERD 06/05/2008  . SEIZURE DISORDER 06/05/2008  . HEADACHE, CHRONIC 06/05/2008   Outpatient Encounter Prescriptions as of 07/08/2016  Medication Sig  . albuterol (PROVENTIL) (2.5 MG/3ML) 0.083% nebulizer solution USE 1 VIAL IN NEBULIZER 4 TIMES A DAY AS NEEDED  . ALPRAZolam (XANAX) 1 MG tablet TAKE  (1)  TABLET  THREE TIMES DAILY.  . fluticasone (FLONASE) 50 MCG/ACT nasal spray Place 1 spray into both nostrils daily.  . Menthol-Zinc Oxide 0.44-20.625 % OINT Apply BID to area  . metoprolol succinate (TOPROL-XL) 25 MG 24 hr tablet TAKE 1 TABLET DAILY  . mupirocin ointment (BACTROBAN) 2 % Place 1 application into the nose 2 (two) times daily.  Marland Kitchen NEXIUM 40 MG capsule TAKE (1) CAPSULE DAILY  . NIASPAN 1000 MG CR tablet Take 1 tablet (1,000 mg total) by  mouth at bedtime.  . pramipexole (MIRAPEX) 0.5 MG tablet Take 1 tablet (0.5 mg total) by mouth at bedtime.  Marland Kitchen PROAIR HFA 108 (90 Base) MCG/ACT inhaler 2 PUFFS EVERY 6 HOURS AS NEEDED FOR WHEEZING OR SHORTNESS OF BREATH  . risperiDONE (RISPERDAL) 2 MG tablet TAKE ONE TABLET AT BEDTIME  . simvastatin (ZOCOR) 20 MG tablet Take 1 tablet (20 mg total) by mouth at bedtime.  . solifenacin (VESICARE) 5 MG tablet TAKE (2) TABLETS DAILY   No facility-administered encounter medications on file as of 07/08/2016.       Review of Systems  Constitutional: Positive for unexpected weight change.  Respiratory: Positive for shortness of breath.   Cardiovascular: Negative.   Neurological: Negative.   Psychiatric/Behavioral: Positive for dysphoric mood.       Objective:   Physical Exam  Constitutional: He appears well-developed and well-nourished.  Cardiovascular: Normal rate.   Pulmonary/Chest: Effort normal and breath sounds normal.  Neurological:  Mini mental status exam was performed today.: He remembers 1 of 3 words at 5 minutes. He does not know the date. He does not recall what he ate for supper last night. Administered animal fluency test whereby he names as many animals as he can in 1 minute. The norm is at least 13. He failed that test as well. He does not remember the name of the president.  I think if we administered a full mental status exam, based on this summary exam he would not fail and would be classified as dementia. More than likely  it is related to history of alcohol abuse.   BP (!) 89/56   Pulse 77   Temp 98.5 F (36.9 C) (Oral)   Ht 5\' 6"  (1.676 m)   Wt 157 lb 12.8 oz (71.6 kg)   BMI 25.47 kg/m         Assessment & Plan:  1. Memory loss or impairment Suspect dementia. Not as typical for Alzheimer's. Consider vascular dementia or related to alcohol. I do not think medicines will help. Family member says he is not interested in doing exercises such as finding words  crossword puzzles and reading. He does not drive. He is not able to write a check her balance is been booked. He is still capable of attending to himself in the areas of activities of daily living such as clothing dressing toileting.  Frederica KusterStephen M Miller MD

## 2016-07-08 ENCOUNTER — Ambulatory Visit (INDEPENDENT_AMBULATORY_CARE_PROVIDER_SITE_OTHER): Payer: Medicaid Other | Admitting: Family Medicine

## 2016-07-08 ENCOUNTER — Encounter: Payer: Self-pay | Admitting: Family Medicine

## 2016-07-08 DIAGNOSIS — R413 Other amnesia: Secondary | ICD-10-CM | POA: Diagnosis not present

## 2016-07-16 ENCOUNTER — Telehealth: Payer: Self-pay | Admitting: Family Medicine

## 2016-07-20 ENCOUNTER — Other Ambulatory Visit: Payer: Self-pay | Admitting: Family Medicine

## 2016-07-20 NOTE — Telephone Encounter (Signed)
Retracting copy of OV note until we can clarify with office manager if this able to be released.

## 2016-07-20 NOTE — Telephone Encounter (Signed)
TC to Ms Patrick MannLock lear that copy of office note from DOS 07/08/16 was at front desk for her to pickup, that we do not notarize our notes or do anything with POA's that she would have to contact her attorney

## 2016-07-22 NOTE — Telephone Encounter (Signed)
OV Manager states until family member has POA OV note can not be released. Note should not be needed to obtain POA

## 2016-07-23 NOTE — Telephone Encounter (Addendum)
Pt only needs a letter written documenting his dementia so that she is able to take over to write his checks. She is not trying to get POA. A copy of an OV note is not able to be released and is not what is needed, just a letter written

## 2016-07-27 ENCOUNTER — Encounter: Payer: Self-pay | Admitting: Family Medicine

## 2016-07-27 NOTE — Telephone Encounter (Signed)
If there is aletter in the communication option, we could do a letter for the sister

## 2016-07-29 ENCOUNTER — Other Ambulatory Visit: Payer: Self-pay | Admitting: Family Medicine

## 2016-07-31 ENCOUNTER — Telehealth: Payer: Self-pay | Admitting: Family Medicine

## 2016-07-31 NOTE — Telephone Encounter (Signed)
Aware letter is at front desk ready for pickup

## 2016-07-31 NOTE — Telephone Encounter (Signed)
Aware letter is at front desk ready for pickup 

## 2016-08-20 ENCOUNTER — Other Ambulatory Visit: Payer: Self-pay | Admitting: Family Medicine

## 2016-08-20 NOTE — Telephone Encounter (Signed)
Refill called to [facility] 

## 2016-09-01 ENCOUNTER — Ambulatory Visit: Payer: Medicaid Other | Admitting: Family Medicine

## 2016-09-17 ENCOUNTER — Other Ambulatory Visit: Payer: Self-pay | Admitting: Family Medicine

## 2016-09-18 ENCOUNTER — Other Ambulatory Visit: Payer: Self-pay | Admitting: Family Medicine

## 2016-10-12 ENCOUNTER — Ambulatory Visit (INDEPENDENT_AMBULATORY_CARE_PROVIDER_SITE_OTHER): Payer: Medicaid Other | Admitting: Family Medicine

## 2016-10-12 DIAGNOSIS — I69061 Other paralytic syndrome following nontraumatic subarachnoid hemorrhage affecting right dominant side: Secondary | ICD-10-CM

## 2016-10-12 DIAGNOSIS — R32 Unspecified urinary incontinence: Secondary | ICD-10-CM | POA: Diagnosis not present

## 2016-10-12 DIAGNOSIS — F329 Major depressive disorder, single episode, unspecified: Secondary | ICD-10-CM | POA: Diagnosis not present

## 2016-10-19 ENCOUNTER — Other Ambulatory Visit: Payer: Self-pay | Admitting: Family Medicine

## 2016-10-20 ENCOUNTER — Other Ambulatory Visit: Payer: Self-pay | Admitting: Nurse Practitioner

## 2016-10-26 ENCOUNTER — Other Ambulatory Visit: Payer: Self-pay

## 2016-10-26 MED ORDER — ALPRAZOLAM 1 MG PO TABS: ORAL_TABLET | ORAL | 1 refills | Status: DC

## 2016-10-26 NOTE — Telephone Encounter (Signed)
Ok'd verbally by Dr Darlyn ReadStacks.

## 2016-11-06 ENCOUNTER — Ambulatory Visit (INDEPENDENT_AMBULATORY_CARE_PROVIDER_SITE_OTHER): Payer: Medicaid Other | Admitting: Family Medicine

## 2016-11-06 ENCOUNTER — Encounter: Payer: Self-pay | Admitting: Family Medicine

## 2016-11-06 VITALS — BP 95/55 | HR 62 | Temp 97.3°F | Ht 66.0 in | Wt 155.0 lb

## 2016-11-06 DIAGNOSIS — I10 Essential (primary) hypertension: Secondary | ICD-10-CM

## 2016-11-06 DIAGNOSIS — L89152 Pressure ulcer of sacral region, stage 2: Secondary | ICD-10-CM

## 2016-11-06 DIAGNOSIS — Z87898 Personal history of other specified conditions: Secondary | ICD-10-CM

## 2016-11-06 DIAGNOSIS — F17200 Nicotine dependence, unspecified, uncomplicated: Secondary | ICD-10-CM | POA: Diagnosis not present

## 2016-11-06 DIAGNOSIS — F1011 Alcohol abuse, in remission: Secondary | ICD-10-CM

## 2016-11-06 DIAGNOSIS — K2100 Gastro-esophageal reflux disease with esophagitis, without bleeding: Secondary | ICD-10-CM

## 2016-11-06 DIAGNOSIS — E782 Mixed hyperlipidemia: Secondary | ICD-10-CM

## 2016-11-06 DIAGNOSIS — F411 Generalized anxiety disorder: Secondary | ICD-10-CM | POA: Diagnosis not present

## 2016-11-06 LAB — URINALYSIS
BILIRUBIN UA: NEGATIVE
GLUCOSE, UA: NEGATIVE
KETONES UA: NEGATIVE
Leukocytes, UA: NEGATIVE
Nitrite, UA: NEGATIVE
PROTEIN UA: NEGATIVE
RBC, UA: NEGATIVE
Specific Gravity, UA: 1.015 (ref 1.005–1.030)
Urobilinogen, Ur: 1 mg/dL (ref 0.2–1.0)
pH, UA: 7 (ref 5.0–7.5)

## 2016-11-06 MED ORDER — ALPRAZOLAM 1 MG PO TABS: ORAL_TABLET | ORAL | 5 refills | Status: DC

## 2016-11-06 NOTE — Progress Notes (Signed)
Subjective:  Patient ID: [name], male    DOB: 13-Oct-1955  Age: 61 y.o. MRN: [id number]  CC: Follow-up (pt here today for routine follow up on chronic medical conditions, no other concerns voiced.)   HPI [name] presents for  follow-up of hypertension. Patient has no history of headache chest pain or shortness of breath or recent cough. Patient also denies symptoms of TIA such as numbness weakness lateralizing. Patient Has a caretaker who checks  blood pressure at home and has not had any elevated readings recently. Patient denies side effects from medication. States taking it regularly. Patient in for follow-up of GERD. Currently asymptomatic taking  PPI daily. There is no chest pain or heartburn. No hematemesis and no melena. No dysphagia or choking. Onset is remote. Progression is stable. Complicating factors, none. Caretaker who speaks for the patient today tells me that there is a sore on his bottom.  History [name] has a past medical history of Anxiety; Cerebral aneurysm; Closed head injury; COPD (chronic obstructive pulmonary disease) ([place]); Depression; GERD (gastroesophageal reflux disease); Hyperlipidemia; Hypertension; Seizures ([place]); and Sleep apnea.   He has no past surgical history on file.   His family history is not on file.He reports that he has been smoking.  He has been smoking about 1.00 pack per day. He has never used smokeless tobacco. He reports that he does not drink alcohol or use drugs.  Current Outpatient Prescriptions on File Prior to Visit  Medication Sig Dispense Refill  . albuterol (PROVENTIL) (2.5 MG/3ML) 0.083% nebulizer solution USE 1 VIAL IN NEBULIZER 4 TIMES A DAY AS NEEDED 360 mL 4  . fluticasone (FLONASE) 50 MCG/ACT nasal spray Place 1 spray into both nostrils daily. 16 g 6  . Menthol-Zinc Oxide 0.44-20.625 % OINT Apply BID to area 113 g 3  . metoprolol succinate (TOPROL-XL) 25 MG 24 hr tablet TAKE 1 TABLET DAILY 90 tablet 0  .  mupirocin ointment (BACTROBAN) 2 % PLACE 1 APPLICATION INTO THE NOSE 2 TIMES A DAY 30 g 0  . NEXIUM 40 MG capsule TAKE (1) CAPSULE DAILY 30 capsule 4  . niacin (NIASPAN) 1000 MG CR tablet Take 1 tablet (1,000 mg total) by mouth at bedtime. 30 tablet 5  . pramipexole (MIRAPEX) 0.5 MG tablet Take 1 tablet (0.5 mg total) by mouth at bedtime. 90 tablet 1  . PROAIR HFA 108 (90 Base) MCG/ACT inhaler 2 PUFFS EVERY 6 HOURS AS NEEDED FOR WHEEZING OR SHORTNESS OF BREATH 8.5 g 4  . risperiDONE (RISPERDAL) 2 MG tablet TAKE ONE TABLET AT BEDTIME 30 tablet 0  . simvastatin (ZOCOR) 20 MG tablet Take 1 tablet (20 mg total) by mouth at bedtime. 90 tablet 1  . VESICARE 5 MG tablet TAKE (2) TABLETS DAILY 60 tablet 5   No current facility-administered medications on file prior to visit.     ROS Review of Systems  Unable to perform ROS: Dementia    Objective:  BP (!) 95/55   Pulse 62   Temp 97.3 F (36.3 C) (Oral)   Ht _0  (1.676 m)   Wt 155 lb (70.3 kg)   BMI 25.02 kg/m   BP Readings from Last 3 Encounters:  11/06/16 (!) 95/55  07/08/16 (!) 89/56  05/12/16 112/71    Wt Readings from Last 3 Encounters:  11/06/16 155 lb (70.3 kg)  07/08/16 157 lb 12.8 oz (71.6 kg)  05/12/16 161 lb (73 kg)     Physical Exam  Constitutional: He is  oriented to person, place, and time. He appears well-developed and well-nourished. No distress.  HENT:  Head: Normocephalic and atraumatic.  Right Ear: External ear normal.  Left Ear: External ear normal.  Nose: Nose normal.  Mouth/Throat: Oropharynx is clear and moist.  Eyes: Conjunctivae and EOM are normal. Pupils are equal, round, and reactive to light.  Neck: Normal range of motion. Neck supple. No thyromegaly present.  Cardiovascular: Normal rate, regular rhythm and normal heart sounds.   No murmur heard. Pulmonary/Chest: Effort normal and breath sounds normal. No respiratory distress. He has no wheezes. He has no rales.  Abdominal: Soft. Bowel sounds  are normal. He exhibits no distension. There is no tenderness.  Lymphadenopathy:    He has no cervical adenopathy.  Neurological: He is alert and oriented to person, place, and time. He has normal reflexes.  Skin: Skin is warm and dry.  Psychiatric: He has a normal mood and affect. His behavior is normal. Thought content normal. Cognition and memory are impaired. He is noncommunicative. He exhibits abnormal recent memory and abnormal remote memory.  Smiles and nods intermittently.     Lab Results  Component Value Date   WBC 10.1 04/29/2016   HGB 12.7 (L) 04/29/2016   HCT 36.7 (L) 04/29/2016   PLT 188 04/29/2016   GLUCOSE 114 (H) 04/29/2016   CHOL 114 08/15/2015   TRIG 111 08/15/2015   HDL 41 08/15/2015   LDLCALC 51 08/15/2015   ALT 24 08/15/2015   AST 23 08/15/2015   NA 134 (L) 04/29/2016   K 3.5 04/29/2016   CL 101 04/29/2016   CREATININE 0.84 04/29/2016   BUN 8 04/29/2016   CO2 26 04/29/2016   TSH 1.030 01/08/2014   PSA 1.9 01/08/2014   INR 1.0 03/31/2007   HGBA1C 5.4% 01/27/2013    Dg Chest 2 View  Result Date: 04/29/2016 CLINICAL DATA:  Shortness of breath, fever. EXAM: CHEST  2 VIEW COMPARISON:  Radiographs of July 10, 2013. FINDINGS: The heart size and mediastinal contours are within normal limits. Both lungs are clear. No pneumothorax or pleural effusion is noted. Atherosclerosis of thoracic aorta is noted. The visualized skeletal structures are unremarkable. IMPRESSION: No active cardiopulmonary disease.  Aortic atherosclerosis. Electronically Signed   By: Marijo Conception, M.D.   On: 04/29/2016 11:39    Assessment & Plan:   [name] was seen today for follow-up.  Diagnoses and all orders for this visit:  Mixed hyperlipidemia -     Urinalysis -     Lipid panel  Generalized anxiety disorder -     Urinalysis  History of alcohol abuse -     Urinalysis  TOBACCO ABUSE -     Urinalysis  Essential hypertension -     Microalbumin / creatinine urine  ratio -     Urinalysis -     Cancel: Bayer DCA Hb A1c Waived -     Lipid panel -     CMP14+EGFR -     CBC with Differential/Platelet  Gastroesophageal reflux disease with esophagitis -     Urinalysis -     CBC with Differential/Platelet  Sacral decubitus ulcer, stage II -     DME Other see comment -     Urinalysis -     CBC with Differential/Platelet  Other orders -     ALPRAZolam (XANAX) 1 MG tablet; TAKE  (1)  TABLET  THREE TIMES DAILY.   I am having Mr. Quiroa maintain his Menthol-Zinc Oxide, simvastatin, pramipexole, fluticasone,  VESICARE, niacin, NEXIUM, PROAIR HFA, albuterol, metoprolol succinate, risperiDONE, mupirocin ointment, and ALPRAZolam.  Meds ordered this encounter  Medications  . ALPRAZolam (XANAX) 1 MG tablet    Sig: TAKE  (1)  TABLET  THREE TIMES DAILY.    Dispense:  90 tablet    Refill:  5      Follow-up: Return in about 6 months (around 05/09/2017).  Claretta Fraise, M.D.

## 2016-11-07 LAB — CBC WITH DIFFERENTIAL/PLATELET
BASOS ABS: 0.1 10*3/uL (ref 0.0–0.2)
Basos: 1 %
EOS (ABSOLUTE): 0.2 10*3/uL (ref 0.0–0.4)
Eos: 1 %
HEMOGLOBIN: 14.1 g/dL (ref 13.0–17.7)
Hematocrit: 41.3 % (ref 37.5–51.0)
Immature Grans (Abs): 0 10*3/uL (ref 0.0–0.1)
Immature Granulocytes: 0 %
LYMPHS ABS: 2.7 10*3/uL (ref 0.7–3.1)
LYMPHS: 24 %
MCH: 33.3 pg — ABNORMAL HIGH (ref 26.6–33.0)
MCHC: 34.1 g/dL (ref 31.5–35.7)
MCV: 97 fL (ref 79–97)
MONOCYTES: 6 %
Monocytes Absolute: 0.7 10*3/uL (ref 0.1–0.9)
Neutrophils Absolute: 7.6 10*3/uL — ABNORMAL HIGH (ref 1.4–7.0)
Neutrophils: 68 %
PLATELETS: 250 10*3/uL (ref 150–379)
RBC: 4.24 x10E6/uL (ref 4.14–5.80)
RDW: 13.6 % (ref 12.3–15.4)
WBC: 11.2 10*3/uL — AB (ref 3.4–10.8)

## 2016-11-07 LAB — CMP14+EGFR
ALK PHOS: 76 IU/L (ref 39–117)
ALT: 16 IU/L (ref 0–44)
AST: 17 IU/L (ref 0–40)
Albumin/Globulin Ratio: 1.7 (ref 1.2–2.2)
Albumin: 4.5 g/dL (ref 3.6–4.8)
BILIRUBIN TOTAL: 0.4 mg/dL (ref 0.0–1.2)
BUN / CREAT RATIO: 10 (ref 10–24)
BUN: 9 mg/dL (ref 8–27)
CHLORIDE: 98 mmol/L (ref 96–106)
CO2: 24 mmol/L (ref 18–29)
Calcium: 9.6 mg/dL (ref 8.6–10.2)
Creatinine, Ser: 0.86 mg/dL (ref 0.76–1.27)
GFR calc Af Amer: 107 mL/min/{1.73_m2} (ref >59)
GFR calc non Af Amer: 93 mL/min/{1.73_m2} (ref >59)
GLUCOSE: 107 mg/dL — AB (ref 65–99)
Globulin, Total: 2.7 g/dL (ref 1.5–4.5)
Potassium: 4.4 mmol/L (ref 3.5–5.2)
Sodium: 135 mmol/L (ref 134–144)
Total Protein: 7.2 g/dL (ref 6.0–8.5)

## 2016-11-07 LAB — LIPID PANEL
CHOL/HDL RATIO: 2.4 ratio (ref 0.0–5.0)
Cholesterol, Total: 109 mg/dL (ref 100–199)
HDL: 45 mg/dL (ref >39)
LDL Calculated: 44 mg/dL (ref 0–99)
Triglycerides: 102 mg/dL (ref 0–149)
VLDL CHOLESTEROL CAL: 20 mg/dL (ref 5–40)

## 2016-11-07 LAB — MICROALBUMIN / CREATININE URINE RATIO
Creatinine, Urine: 69.7 mg/dL
Microalb/Creat Ratio: 25.4 mg/g creat (ref 0.0–30.0)
Microalbumin, Urine: 17.7 ug/mL

## 2016-11-18 ENCOUNTER — Other Ambulatory Visit: Payer: Self-pay | Admitting: Family Medicine

## 2016-12-09 ENCOUNTER — Other Ambulatory Visit: Payer: Self-pay | Admitting: Nurse Practitioner

## 2016-12-12 ENCOUNTER — Ambulatory Visit (INDEPENDENT_AMBULATORY_CARE_PROVIDER_SITE_OTHER): Payer: Medicaid Other | Admitting: Family Medicine

## 2016-12-12 DIAGNOSIS — R32 Unspecified urinary incontinence: Secondary | ICD-10-CM

## 2016-12-12 DIAGNOSIS — I69061 Other paralytic syndrome following nontraumatic subarachnoid hemorrhage affecting right dominant side: Secondary | ICD-10-CM

## 2016-12-12 DIAGNOSIS — F329 Major depressive disorder, single episode, unspecified: Secondary | ICD-10-CM | POA: Diagnosis not present

## 2016-12-19 ENCOUNTER — Other Ambulatory Visit: Payer: Self-pay | Admitting: Family Medicine

## 2016-12-21 NOTE — Telephone Encounter (Signed)
Xanax Rx from 5/25 was for 90 pills, take TID with 5 refills. From [facility] review it looks like 90 pills with 1 refill was called in. Can you tell why was it called in differently than that?

## 2016-12-21 NOTE — Telephone Encounter (Signed)
Last seen 5.25.18  Dr Darlyn ReadStacks   If approved [facility]

## 2017-01-06 ENCOUNTER — Other Ambulatory Visit: Payer: Self-pay | Admitting: Family Medicine

## 2017-01-20 ENCOUNTER — Other Ambulatory Visit: Payer: Self-pay | Admitting: Family Medicine

## 2017-02-11 ENCOUNTER — Other Ambulatory Visit: Payer: Self-pay | Admitting: Family Medicine

## 2017-02-11 ENCOUNTER — Other Ambulatory Visit: Payer: Self-pay | Admitting: Pediatrics

## 2017-02-12 ENCOUNTER — Ambulatory Visit (INDEPENDENT_AMBULATORY_CARE_PROVIDER_SITE_OTHER): Payer: Medicaid Other | Admitting: Family Medicine

## 2017-02-12 DIAGNOSIS — I69061 Other paralytic syndrome following nontraumatic subarachnoid hemorrhage affecting right dominant side: Secondary | ICD-10-CM | POA: Diagnosis not present

## 2017-02-12 DIAGNOSIS — R32 Unspecified urinary incontinence: Secondary | ICD-10-CM

## 2017-02-12 DIAGNOSIS — F329 Major depressive disorder, single episode, unspecified: Secondary | ICD-10-CM

## 2017-02-12 NOTE — Telephone Encounter (Signed)
Last seen 02/06/17  Dr Darlyn ReadStacks  If approved route [facility]

## 2017-03-16 ENCOUNTER — Other Ambulatory Visit: Payer: Self-pay | Admitting: Family Medicine

## 2017-03-16 ENCOUNTER — Other Ambulatory Visit: Payer: Self-pay | Admitting: Pediatrics

## 2017-03-16 NOTE — Telephone Encounter (Signed)
Dr Hyacinth Meeker PCP  Last seen 11/06/16 Dr Darlyn Read   If approved route to nurse to call into [facility]

## 2017-03-16 NOTE — Telephone Encounter (Signed)
Phoned in.

## 2017-03-16 NOTE — Telephone Encounter (Signed)
[name] is no longer PCP = this should be changed - pt of Dr Darlyn Read as of MAY  - Stacks please address

## 2017-04-15 ENCOUNTER — Other Ambulatory Visit: Payer: Self-pay | Admitting: Family Medicine

## 2017-04-27 ENCOUNTER — Ambulatory Visit: Payer: Medicaid Other

## 2017-04-28 ENCOUNTER — Ambulatory Visit: Payer: Medicaid Other

## 2017-05-11 ENCOUNTER — Ambulatory Visit (INDEPENDENT_AMBULATORY_CARE_PROVIDER_SITE_OTHER): Payer: Medicaid Other | Admitting: Family Medicine

## 2017-05-11 ENCOUNTER — Ambulatory Visit: Payer: Medicaid Other | Admitting: Family Medicine

## 2017-05-11 ENCOUNTER — Telehealth: Payer: Self-pay | Admitting: Family Medicine

## 2017-05-11 ENCOUNTER — Encounter: Payer: Self-pay | Admitting: Family Medicine

## 2017-05-11 VITALS — BP 99/61 | HR 84 | Temp 97.1°F | Ht 66.0 in | Wt 148.0 lb

## 2017-05-11 DIAGNOSIS — N400 Enlarged prostate without lower urinary tract symptoms: Secondary | ICD-10-CM | POA: Diagnosis not present

## 2017-05-11 DIAGNOSIS — E782 Mixed hyperlipidemia: Secondary | ICD-10-CM

## 2017-05-11 DIAGNOSIS — J439 Emphysema, unspecified: Secondary | ICD-10-CM

## 2017-05-11 DIAGNOSIS — I25119 Atherosclerotic heart disease of native coronary artery with unspecified angina pectoris: Secondary | ICD-10-CM

## 2017-05-11 DIAGNOSIS — Z23 Encounter for immunization: Secondary | ICD-10-CM

## 2017-05-11 DIAGNOSIS — I10 Essential (primary) hypertension: Secondary | ICD-10-CM | POA: Diagnosis not present

## 2017-05-11 MED ORDER — ALPRAZOLAM 1 MG PO TABS: ORAL_TABLET | ORAL | 5 refills | Status: DC

## 2017-05-11 MED ORDER — ALBUTEROL SULFATE (2.5 MG/3ML) 0.083% IN NEBU: INHALATION_SOLUTION | RESPIRATORY_TRACT | 1 refills | Status: DC

## 2017-05-11 MED ORDER — PRAMIPEXOLE DIHYDROCHLORIDE 0.5 MG PO TABS: ORAL_TABLET | ORAL | 1 refills | Status: DC

## 2017-05-11 MED ORDER — RISPERIDONE 2 MG PO TABS: 2.0000 mg | ORAL_TABLET | Freq: Every day | ORAL | 1 refills | Status: DC

## 2017-05-11 MED ORDER — SOLIFENACIN SUCCINATE 5 MG PO TABS: ORAL_TABLET | ORAL | 1 refills | Status: DC

## 2017-05-11 MED ORDER — DUODERM CGF BORDER EX MISC: CUTANEOUS | 6 refills | Status: DC

## 2017-05-11 MED ORDER — FLUTICASONE PROPIONATE 50 MCG/ACT NA SUSP: 1.0000 | Freq: Every day | NASAL | 2 refills | Status: DC

## 2017-05-11 MED ORDER — NIACIN ER (ANTIHYPERLIPIDEMIC) 1000 MG PO TBCR: EXTENDED_RELEASE_TABLET | ORAL | 1 refills | Status: DC

## 2017-05-11 MED ORDER — SIMVASTATIN 20 MG PO TABS: ORAL_TABLET | ORAL | 1 refills | Status: DC

## 2017-05-11 MED ORDER — ESOMEPRAZOLE MAGNESIUM 40 MG PO CPDR: DELAYED_RELEASE_CAPSULE | ORAL | 1 refills | Status: DC

## 2017-05-11 MED ORDER — ALBUTEROL SULFATE HFA 108 (90 BASE) MCG/ACT IN AERS: INHALATION_SPRAY | RESPIRATORY_TRACT | 1 refills | Status: DC

## 2017-05-11 MED ORDER — METOPROLOL SUCCINATE ER 25 MG PO TB24: 25.0000 mg | ORAL_TABLET | Freq: Every day | ORAL | 1 refills | Status: DC

## 2017-05-11 MED ORDER — MOMETASONE FUROATE 50 MCG/ACT NA SUSP: 2.0000 | Freq: Two times a day (BID) | NASAL | 12 refills | Status: DC

## 2017-05-11 NOTE — Telephone Encounter (Addendum)
Pt sister requesting previous weights Discussed weights  Sister was under impression that pt had weighed >170lbs at last visit and was concerned about weight loss Reviewed weights for last 6 mths with sister Pt has had minimal weight loss

## 2017-05-11 NOTE — Progress Notes (Signed)
Subjective:  Patient ID: [name], male    DOB: 03-23-1956  Age: 61 y.o. MRN: [id number]  CC: Hyperlipidemia (pt here today for routine follow up of his chronic medical conditions)   HPI DEAGLAN LILE presents for Patient in for follow-up of elevated cholesterol. Doing well without complaints on current medication. Denies side effects of statin including myalgia and arthralgia and nausea. Also in today for liver function testing. Currently no chest pain, shortness of breath or other cardiovascular related symptoms noted.  Follow-up of hypertension. Patient has no history of headache chest pain or shortness of breath or recent cough. Patient also denies symptoms of TIA such as numbness weakness lateralizing. Patient checks  blood pressure at home and has not had any elevated readings recently. Patient denies side effects from his medication. States taking it regularly.  Patient also struggles with anxiety and depression.  He is stable currently.  He does need to have his alprazolam renewed.  His SSRI as well.   Depression screen [facility] 2/9 05/11/2017 11/06/2016 07/08/2016  Decreased Interest 0 3 0  Down, Depressed, Hopeless 0 2 1  PHQ - 2 Score 0 5 1  Altered sleeping - 3 -  Tired, decreased energy - 3 -  Change in appetite - 0 -  Feeling bad or failure about yourself  - 0 -  Trouble concentrating - 2 -  Moving slowly or fidgety/restless - 2 -  Suicidal thoughts - 0 -  PHQ-9 Score - 15 -    History Jerimyah has a past medical history of Anxiety, Cerebral aneurysm, Closed head injury, COPD (chronic obstructive pulmonary disease) (HCC), Depression, GERD (gastroesophageal reflux disease), Hyperlipidemia, Hypertension, Seizures ([place]), and Sleep apnea.   He has no past surgical history on file.   His family history is not on file.He reports that he has been smoking.  He has been smoking about 1.00 pack per day. he has never used smokeless tobacco. He reports that he does not drink  alcohol or use drugs.    ROS Review of Systems  Constitutional: Negative for chills, diaphoresis, fever and unexpected weight change.  HENT: Negative for congestion, hearing loss, rhinorrhea and sore throat.   Eyes: Negative for visual disturbance.  Respiratory: Negative for cough and shortness of breath.   Cardiovascular: Negative for chest pain.  Gastrointestinal: Negative for abdominal pain, constipation and diarrhea.  Genitourinary: Negative for dysuria and flank pain.  Musculoskeletal: Negative for arthralgias and joint swelling.  Skin: Negative for rash.  Neurological: Negative for dizziness and headaches.  Psychiatric/Behavioral: Negative for dysphoric mood and sleep disturbance.    Objective:  BP 99/61   Pulse 84   Temp (!) 97.1 F (36.2 C) (Oral)   Ht _0  (1.676 m)   Wt 148 lb (67.1 kg)   BMI 23.89 kg/m   BP Readings from Last 3 Encounters:  05/11/17 99/61  11/06/16 (!) 95/55  07/08/16 (!) 89/56    Wt Readings from Last 3 Encounters:  05/11/17 148 lb (67.1 kg)  11/06/16 155 lb (70.3 kg)  07/08/16 157 lb 12.8 oz (71.6 kg)     Physical Exam  Constitutional: He is oriented to person, place, and time. He appears well-developed and well-nourished. No distress.  HENT:  Head: Normocephalic and atraumatic.  Right Ear: External ear normal.  Left Ear: External ear normal.  Nose: Nose normal.  Mouth/Throat: Oropharynx is clear and moist.  Eyes: Conjunctivae and EOM are normal. Pupils are equal, round, and reactive to light.  Neck: Normal range of motion. Neck supple. No thyromegaly present.  Cardiovascular: Normal rate, regular rhythm and normal heart sounds.  No murmur heard. Pulmonary/Chest: Effort normal and breath sounds normal. No respiratory distress. He has no wheezes. He has no rales.  Abdominal: Soft. Bowel sounds are normal. He exhibits no distension. There is no tenderness.  Lymphadenopathy:    He has no cervical adenopathy.  Neurological: He is  alert and oriented to person, place, and time. He has normal reflexes.  Skin: Skin is warm and dry.  Psychiatric: He has a normal mood and affect. His behavior is normal. Judgment and thought content normal.      Assessment & Plan:   Brantlee was seen today for hyperlipidemia.  Diagnoses and all orders for this visit:  Essential hypertension -     CBC with Differential/Platelet -     CMP14+EGFR  Pulmonary emphysema, unspecified emphysema type ([place])  Coronary artery disease with angina pectoris, unspecified vessel or lesion type, unspecified whether native or transplanted heart (HCC) -     Lipid panel  Benign prostatic hyperplasia, unspecified whether lower urinary tract symptoms present -     PSA, total and free  Need for immunization against influenza -     Flu Vaccine QUAD 36+ mos IM  Mixed hyperlipidemia  Other orders -     ALPRAZolam (XANAX) 1 MG tablet; TAKE  (1)  TABLET  THREE TIMES DAILY. -     albuterol (PROVENTIL) (2.5 MG/3ML) 0.083% nebulizer solution; USE 1 VIAL IN NEBULIZER 4 TIMES A DAY AS NEEDED -     esomeprazole (NEXIUM) 40 MG capsule; TAKE (1) CAPSULE DAILY -     fluticasone (FLONASE) 50 MCG/ACT nasal spray; Place 1 spray into both nostrils daily. -     metoprolol succinate (TOPROL-XL) 25 MG 24 hr tablet; Take 1 tablet (25 mg total) by mouth daily. -     niacin (NIASPAN) 1000 MG CR tablet; Take 1 tablet (1,000 mg total) by mouth at bedtime. -     pramipexole (MIRAPEX) 0.5 MG tablet; Take 1 tablet (0.5 mg total) by mouth at bedtime. -     albuterol (PROAIR HFA) 108 (90 Base) MCG/ACT inhaler; 2 PUFFS EVERY 6 HOURS AS NEEDED FOR WHEEZING OR SHORTNESS OF BREATH -     risperiDONE (RISPERDAL) 2 MG tablet; Take 1 tablet (2 mg total) by mouth at bedtime. -     simvastatin (ZOCOR) 20 MG tablet; Take 1 tablet (20 mg total) by mouth at bedtime. -     solifenacin (VESICARE) 5 MG tablet; TAKE (2) TABLETS DAILY -     Control Gel Formula Dressing (DUODERM CGF BORDER) MISC;  Apply to sore on buttocks weekly -     mometasone (NASONEX) 50 MCG/ACT nasal spray; Place 2 sprays into the nose 2 (two) times daily.       I have changed [facility]'s PROAIR HFA to albuterol and VESICARE to solifenacin. I have also changed his metoprolol succinate and risperiDONE. I am also having him start on DUODERM CGF BORDER and mometasone. Additionally, I am having him maintain his Menthol-Zinc Oxide, mupirocin ointment, ALPRAZolam, albuterol, esomeprazole, fluticasone, niacin, pramipexole, and simvastatin.  Allergies as of 05/11/2017      Reactions   Clonidine    REACTION: rash      Medication List        Accurate as of 05/11/17  6:30 PM. Always use your most recent med list.  albuterol (2.5 MG/3ML) 0.083% nebulizer solution Commonly known as:  PROVENTIL USE 1 VIAL IN NEBULIZER 4 TIMES A DAY AS NEEDED   albuterol 108 (90 Base) MCG/ACT inhaler Commonly known as:  PROAIR HFA 2 PUFFS EVERY 6 HOURS AS NEEDED FOR WHEEZING OR SHORTNESS OF BREATH   ALPRAZolam 1 MG tablet Commonly known as:  XANAX TAKE  (1)  TABLET  THREE TIMES DAILY.   DUODERM CGF BORDER Misc Apply to sore on buttocks weekly   esomeprazole 40 MG capsule Commonly known as:  NEXIUM TAKE (1) CAPSULE DAILY   fluticasone 50 MCG/ACT nasal spray Commonly known as:  FLONASE Place 1 spray into both nostrils daily.   Menthol-Zinc Oxide 0.44-20.625 % Oint Apply BID to area   metoprolol succinate 25 MG 24 hr tablet Commonly known as:  TOPROL-XL Take 1 tablet (25 mg total) by mouth daily.   mometasone 50 MCG/ACT nasal spray Commonly known as:  NASONEX Place 2 sprays into the nose 2 (two) times daily.   mupirocin ointment 2 % Commonly known as:  BACTROBAN PLACE 1 APPLICATION INTO THE NOSE 2 TIMES A DAY   niacin 1000 MG CR tablet Commonly known as:  NIASPAN Take 1 tablet (1,000 mg total) by mouth at bedtime.   pramipexole 0.5 MG tablet Commonly known as:  MIRAPEX Take 1 tablet  (0.5 mg total) by mouth at bedtime.   risperiDONE 2 MG tablet Commonly known as:  RISPERDAL Take 1 tablet (2 mg total) by mouth at bedtime.   simvastatin 20 MG tablet Commonly known as:  ZOCOR Take 1 tablet (20 mg total) by mouth at bedtime.   solifenacin 5 MG tablet Commonly known as:  VESICARE TAKE (2) TABLETS DAILY        Follow-up: Return in about 6 months (around 11/08/2017).  Claretta Fraise, M.D.

## 2017-05-12 LAB — CBC WITH DIFFERENTIAL/PLATELET
Basophils Absolute: 0.1 10*3/uL (ref 0.0–0.2)
Basos: 1 %
EOS (ABSOLUTE): 0.2 10*3/uL (ref 0.0–0.4)
Eos: 2 %
Hematocrit: 39.6 % (ref 37.5–51.0)
Hemoglobin: 13.4 g/dL (ref 13.0–17.7)
IMMATURE GRANULOCYTES: 0 %
Immature Grans (Abs): 0 10*3/uL (ref 0.0–0.1)
LYMPHS ABS: 2 10*3/uL (ref 0.7–3.1)
Lymphs: 25 %
MCH: 32.3 pg (ref 26.6–33.0)
MCHC: 33.8 g/dL (ref 31.5–35.7)
MCV: 95 fL (ref 79–97)
MONOS ABS: 0.6 10*3/uL (ref 0.1–0.9)
Monocytes: 8 %
NEUTROS ABS: 5.2 10*3/uL (ref 1.4–7.0)
NEUTROS PCT: 64 %
Platelets: 222 10*3/uL (ref 150–379)
RBC: 4.15 x10E6/uL (ref 4.14–5.80)
RDW: 13.5 % (ref 12.3–15.4)
WBC: 8 10*3/uL (ref 3.4–10.8)

## 2017-05-12 LAB — CMP14+EGFR
A/G RATIO: 1.8 (ref 1.2–2.2)
ALBUMIN: 4.3 g/dL (ref 3.6–4.8)
ALT: 18 IU/L (ref 0–44)
AST: 25 IU/L (ref 0–40)
Alkaline Phosphatase: 76 IU/L (ref 39–117)
BUN/Creatinine Ratio: 17 (ref 10–24)
BUN: 15 mg/dL (ref 8–27)
Bilirubin Total: 0.3 mg/dL (ref 0.0–1.2)
CALCIUM: 9.5 mg/dL (ref 8.6–10.2)
CO2: 24 mmol/L (ref 20–29)
CREATININE: 0.86 mg/dL (ref 0.76–1.27)
Chloride: 100 mmol/L (ref 96–106)
GFR, EST AFRICAN AMERICAN: 107 mL/min/{1.73_m2} (ref >59)
GFR, EST NON AFRICAN AMERICAN: 92 mL/min/{1.73_m2} (ref >59)
GLOBULIN, TOTAL: 2.4 g/dL (ref 1.5–4.5)
Glucose: 98 mg/dL (ref 65–99)
POTASSIUM: 4.1 mmol/L (ref 3.5–5.2)
SODIUM: 139 mmol/L (ref 134–144)
TOTAL PROTEIN: 6.7 g/dL (ref 6.0–8.5)

## 2017-05-12 LAB — PSA, TOTAL AND FREE
PSA FREE: 0.45 ng/mL
PSA, Free Pct: 18.8 %
Prostate Specific Ag, Serum: 2.4 ng/mL (ref 0.0–4.0)

## 2017-05-12 LAB — LIPID PANEL
CHOL/HDL RATIO: 2.3 ratio (ref 0.0–5.0)
Cholesterol, Total: 114 mg/dL (ref 100–199)
HDL: 49 mg/dL (ref >39)
LDL Calculated: 44 mg/dL (ref 0–99)
TRIGLYCERIDES: 105 mg/dL (ref 0–149)
VLDL Cholesterol Cal: 21 mg/dL (ref 5–40)

## 2017-05-28 ENCOUNTER — Telehealth: Payer: Self-pay | Admitting: Family Medicine

## 2017-05-28 NOTE — Telephone Encounter (Signed)
Patient sister [name](Azzie) states brother [name], cannot afford the medication Flonase or the Bactroban ointment.  Insurance will not cover.  Would you please call in something else for him.

## 2017-05-28 NOTE — Telephone Encounter (Signed)
Patient was seen and prescribed on 05/11/17 by Dr. Darlyn ReadStacks.  Please advise.

## 2017-06-03 MED ORDER — FLUTICASONE PROPIONATE 50 MCG/ACT NA SUSP
1.0000 | Freq: Every day | NASAL | 2 refills | Status: DC
Start: 2017-06-03 — End: 2017-11-12

## 2017-06-03 NOTE — Telephone Encounter (Signed)
Spoke to pt's EC and advised of MD feedback and she states she will call the pharmacy to check the cost of the medication and call us back to let us know if they can't get it.

## 2017-06-04 ENCOUNTER — Encounter: Payer: Self-pay | Admitting: Family Medicine

## 2017-06-04 ENCOUNTER — Ambulatory Visit (INDEPENDENT_AMBULATORY_CARE_PROVIDER_SITE_OTHER): Payer: Medicaid Other | Admitting: Family Medicine

## 2017-06-04 VITALS — BP 114/72 | HR 84 | Temp 97.3°F | Ht 66.0 in | Wt 143.0 lb

## 2017-06-04 DIAGNOSIS — M25512 Pain in left shoulder: Secondary | ICD-10-CM | POA: Diagnosis not present

## 2017-06-04 MED ORDER — METHYLPREDNISOLONE ACETATE 80 MG/ML IJ SUSP
80.0000 mg | Freq: Once | INTRAMUSCULAR | Status: AC
  Administered 2017-06-04: 80 mg via INTRAMUSCULAR

## 2017-06-04 MED ORDER — MUPIROCIN 2 % EX OINT: TOPICAL_OINTMENT | CUTANEOUS | 2 refills | Status: DC

## 2017-06-04 NOTE — Progress Notes (Signed)
Subjective:  Patient ID: [name], male    DOB: 17-Sep-1955  Age: 61 y.o. MRN: [id number]  CC: Shoulder Pain (pt here today c/o left shoulder pain since yesterday without any injury)   HPI [name] presents for left upper arm pain radiating to the forearm. Recurs periodically. Feels numb at times. No other limbs involved. Pt. Has hx of TBI. Denies weakness.   [facility] 06/04/2017 05/11/2017 11/06/2016  Decreased Interest 0 0 3  Down, Depressed, Hopeless 0 0 2  PHQ - 2 Score 0 0 5  Altered sleeping - - 3  Tired, decreased energy - - 3  Change in appetite - - 0  Feeling bad or failure about yourself  - - 0  Trouble concentrating - - 2  Moving slowly or fidgety/restless - - 2  Suicidal thoughts - - 0  PHQ-9 Score - - 15    History [name] has a past medical history of Anxiety, Cerebral aneurysm, Closed head injury, COPD (chronic obstructive pulmonary disease) (HCC), Depression, GERD (gastroesophageal reflux disease), Hyperlipidemia, Hypertension, Seizures (HCC), and Sleep apnea.   He has no past surgical history on file.   His family history is not on file.He reports that he has been smoking.  He has been smoking about 1.00 pack per day. he has never used smokeless tobacco. He reports that he does not drink alcohol or use drugs.    ROS Review of Systems  Constitutional: Negative for chills, diaphoresis and fever.  HENT: Negative for rhinorrhea and sore throat.   Respiratory: Negative for cough and shortness of breath.   Cardiovascular: Negative for chest pain.  Gastrointestinal: Negative for abdominal pain.  Musculoskeletal: Negative for arthralgias and myalgias.  Skin: Negative for rash.  Neurological: Negative for weakness and headaches.    Objective:  BP 114/72   Pulse 84   Temp (!) 97.3 F (36.3 C) (Oral)   Ht 5\' 6"  (1.676 m)   Wt 143 lb (64.9 kg)   BMI 23.08 kg/m   BP Readings from Last 3 Encounters:  06/06/17 117/83    06/04/17 114/72  05/11/17 99/61    Wt Readings from Last 3 Encounters:  06/06/17 143 lb (64.9 kg)  06/04/17 143 lb (64.9 kg)  05/11/17 148 lb (67.1 kg)     Physical Exam  Constitutional: He appears well-developed and well-nourished.  HENT:  Head: Normocephalic and atraumatic.  Right Ear: Tympanic membrane and external ear normal. No decreased hearing is noted.  Left Ear: Tympanic membrane and external ear normal. No decreased hearing is noted.  Mouth/Throat: No oropharyngeal exudate or posterior oropharyngeal erythema.  Eyes: Pupils are equal, round, and reactive to light.  Neck: Normal range of motion. Neck supple.  Cardiovascular: Normal rate and regular rhythm.  No murmur heard. Pulmonary/Chest: Breath sounds normal. No respiratory distress.  Abdominal: Soft. There is no tenderness.  Vitals reviewed.     Assessment & Plan:   [name] was seen today for shoulder pain.  Diagnoses and all orders for this visit:  Acute pain of left shoulder -     methylPREDNISolone acetate (DEPO-MEDROL) injection 80 mg  Other orders -     mupirocin ointment (BACTROBAN) 2 %; PLACE 1 APPLICATION INTO THE NOSE 2 TIMES A DAY       I have discontinued [name]'s DUODERM CGF BORDER and mometasone. I am also having him maintain his ALPRAZolam, albuterol, esomeprazole, metoprolol succinate, niacin, pramipexole, albuterol, risperiDONE, simvastatin, solifenacin, fluticasone, and mupirocin ointment. We  administered methylPREDNISolone acetate.  Allergies as of 06/04/2017      Reactions   Clonidine    REACTION: rash      Medication List        Accurate as of 06/04/17 11:59 PM. Always use your most recent med list.          albuterol (2.5 MG/3ML) 0.083% nebulizer solution Commonly known as:  PROVENTIL USE 1 VIAL IN NEBULIZER 4 TIMES A DAY AS NEEDED   albuterol 108 (90 Base) MCG/ACT inhaler Commonly known as:  PROAIR HFA 2 PUFFS EVERY 6 HOURS AS NEEDED FOR WHEEZING OR  SHORTNESS OF BREATH   ALPRAZolam 1 MG tablet Commonly known as:  XANAX TAKE  (1)  TABLET  THREE TIMES DAILY.   esomeprazole 40 MG capsule Commonly known as:  NEXIUM TAKE (1) CAPSULE DAILY   fluticasone 50 MCG/ACT nasal spray Commonly known as:  FLONASE Place 1 spray into both nostrils daily.   Menthol-Zinc Oxide 0.44-20.625 % Oint Apply BID to area   metoprolol succinate 25 MG 24 hr tablet Commonly known as:  TOPROL-XL Take 1 tablet (25 mg total) by mouth daily.   mupirocin ointment 2 % Commonly known as:  BACTROBAN PLACE 1 APPLICATION INTO THE NOSE 2 TIMES A DAY   niacin 1000 MG CR tablet Commonly known as:  NIASPAN Take 1 tablet (1,000 mg total) by mouth at bedtime.   pramipexole 0.5 MG tablet Commonly known as:  MIRAPEX Take 1 tablet (0.5 mg total) by mouth at bedtime.   risperiDONE 2 MG tablet Commonly known as:  RISPERDAL Take 1 tablet (2 mg total) by mouth at bedtime.   simvastatin 20 MG tablet Commonly known as:  ZOCOR Take 1 tablet (20 mg total) by mouth at bedtime.   solifenacin 5 MG tablet Commonly known as:  VESICARE TAKE (2) TABLETS DAILY        Follow-up: No Follow-up on file.  Mechele ClaudeWarren Kemper Heupel, M.D.

## 2017-06-06 ENCOUNTER — Emergency Department (HOSPITAL_COMMUNITY): Payer: Medicaid Other

## 2017-06-06 ENCOUNTER — Emergency Department (HOSPITAL_COMMUNITY)
Admission: EM | Admit: 2017-06-06 | Discharge: 2017-06-06 | Disposition: A | Discharge status: Home / Self Care | Payer: Medicaid Other | Attending: Emergency Medicine | Admitting: Emergency Medicine

## 2017-06-06 ENCOUNTER — Encounter (HOSPITAL_COMMUNITY): Payer: Self-pay

## 2017-06-06 DIAGNOSIS — J449 Chronic obstructive pulmonary disease, unspecified: Secondary | ICD-10-CM | POA: Insufficient documentation

## 2017-06-06 DIAGNOSIS — I2510 Atherosclerotic heart disease of native coronary artery without angina pectoris: Secondary | ICD-10-CM | POA: Diagnosis not present

## 2017-06-06 DIAGNOSIS — M25512 Pain in left shoulder: Secondary | ICD-10-CM | POA: Diagnosis not present

## 2017-06-06 DIAGNOSIS — Z79899 Other long term (current) drug therapy: Secondary | ICD-10-CM | POA: Diagnosis not present

## 2017-06-06 DIAGNOSIS — I10 Essential (primary) hypertension: Secondary | ICD-10-CM | POA: Diagnosis not present

## 2017-06-06 DIAGNOSIS — F17200 Nicotine dependence, unspecified, uncomplicated: Secondary | ICD-10-CM | POA: Diagnosis not present

## 2017-06-06 MED ORDER — HYDROCODONE-ACETAMINOPHEN 5-325 MG PO TABS: ORAL_TABLET | ORAL | 0 refills | Status: DC

## 2017-06-06 MED ORDER — HYDROCODONE-ACETAMINOPHEN 5-325 MG PO TABS
1.0000 | ORAL_TABLET | Freq: Once | ORAL | Status: AC
  Administered 2017-06-06: 1 via ORAL
  Filled 2017-06-06: qty 1

## 2017-06-06 NOTE — ED Notes (Signed)
Patient transported to X-ray 

## 2017-06-06 NOTE — ED Triage Notes (Signed)
Pt c/o left shoulder pain x 2 days.  Pain worse with movement.  Denies injury.

## 2017-06-06 NOTE — ED Provider Notes (Signed)
[facility] EMERGENCY DEPARTMENT Provider Note   CSN: [id number] Arrival date & time: 06/06/17  0901     History   Chief Complaint Chief Complaint  Patient presents with  . Shoulder Pain    HPI [name] is a 61 y.o. male.  HPI  Pt was seen at 0915. Per pt and his wife, c/o gradual onset and persistence of constant left shoulder "pain" for the past 3 days. Pain worsens with palpation and movement of his arm. Pt was evaluated by his PMD 2 days ago for this complaint, dx "muscle spasm," and "got a steroid shot."  Denies injury, no focal motor weakness, no tingling/numbness in extremities, no CP/SOB, no abd pain, no N/V/D, no fevers, no rash.    Past Medical History:  Diagnosis Date  . Anxiety   . Cerebral aneurysm   . Closed head injury   . COPD (chronic obstructive pulmonary disease) (HCC)   . Depression   . GERD (gastroesophageal reflux disease)   . Hyperlipidemia   . Hypertension   . Seizures (HCC)   . Sleep apnea     Patient Active Problem List   Diagnosis Date Noted  . Memory loss or impairment 07/08/2016  . Loss of weight 07/07/2013  . Frequent urination 11/04/2012  . Seizure disorder (HCC) 11/04/2012  . RLS (restless legs syndrome) 11/04/2012  . HLD (hyperlipidemia) 11/04/2012  . CAD (coronary artery disease) 11/04/2012  . DYSPHAGIA UNSPECIFIED 04/16/2009  . HEAD TRAUMA, CLOSED 04/16/2009  . Hyperlipidemia 06/05/2008  . Generalized anxiety disorder 06/05/2008  . History of alcohol abuse 06/05/2008  . TOBACCO ABUSE 06/05/2008  . DEPRESSION 06/05/2008  . OBSTRUCTIVE SLEEP APNEA 06/05/2008  . Essential hypertension 06/05/2008  . CEREBRAL ANEURYSM 06/05/2008  . COPD (chronic obstructive pulmonary disease) (HCC) 06/05/2008  . GERD 06/05/2008  . SEIZURE DISORDER 06/05/2008  . HEADACHE, CHRONIC 06/05/2008    History reviewed. No pertinent surgical history.     Home Medications    Prior to Admission medications   Medication Sig Start Date  End Date Taking? Authorizing Provider  albuterol (PROAIR HFA) 108 (90 Base) MCG/ACT inhaler 2 PUFFS EVERY 6 HOURS AS NEEDED FOR WHEEZING OR SHORTNESS OF BREATH 05/11/17   Mechele Claude, MD  albuterol (PROVENTIL) (2.5 MG/3ML) 0.083% nebulizer solution USE 1 VIAL IN NEBULIZER 4 TIMES A DAY AS NEEDED 05/11/17   Mechele Claude, MD  ALPRAZolam Prudy Feeler) 1 MG tablet TAKE  (1)  TABLET  THREE TIMES DAILY. 05/11/17   Mechele Claude, MD  esomeprazole (NEXIUM) 40 MG capsule TAKE (1) CAPSULE DAILY 05/11/17   Mechele Claude, MD  fluticasone [facility]) 50 MCG/ACT nasal spray Place 1 spray into both nostrils daily. 06/03/17   Johna Sheriff, MD  Menthol-Zinc Oxide 0.44-20.625 % OINT Apply BID to area 12/19/15   Frederica Kuster, MD  metoprolol succinate (TOPROL-XL) 25 MG 24 hr tablet Take 1 tablet (25 mg total) by mouth daily. 05/11/17   Mechele Claude, MD  mupirocin ointment (BACTROBAN) 2 % PLACE 1 APPLICATION INTO THE NOSE 2 TIMES A DAY 06/04/17   Mechele Claude, MD  niacin (NIASPAN) 1000 MG CR tablet Take 1 tablet (1,000 mg total) by mouth at bedtime. 05/11/17   Mechele Claude, MD  pramipexole (MIRAPEX) 0.5 MG tablet Take 1 tablet (0.5 mg total) by mouth at bedtime. 05/11/17   Mechele Claude, MD  risperiDONE (RISPERDAL) 2 MG tablet Take 1 tablet (2 mg total) by mouth at bedtime. 05/11/17   Mechele Claude, MD  simvastatin (ZOCOR) 20 MG  tablet Take 1 tablet (20 mg total) by mouth at bedtime. 05/11/17   Mechele ClaudeStacks, Warren, MD  solifenacin (VESICARE) 5 MG tablet TAKE (2) TABLETS DAILY 05/11/17   Mechele ClaudeStacks, Warren, MD    Family History No family history on file.  Social History Social History   Tobacco Use  . Smoking status: Current Every Day Smoker    Packs/day: 1.00  . Smokeless tobacco: Never Used  Substance Use Topics  . Alcohol use: No  . Drug use: No     Allergies   Clonidine   Review of Systems Review of Systems ROS: Statement: All systems negative except as marked or noted in the HPI;  Constitutional: Negative for fever and chills. ; ; Eyes: Negative for eye pain, redness and discharge. ; ; ENMT: Negative for ear pain, hoarseness, nasal congestion, sinus pressure and sore throat. ; ; Cardiovascular: Negative for chest pain, palpitations, diaphoresis, dyspnea and peripheral edema. ; ; Respiratory: Negative for cough, wheezing and stridor. ; ; Gastrointestinal: Negative for nausea, vomiting, diarrhea, abdominal pain, blood in stool, hematemesis, jaundice and rectal bleeding. . ; ; Genitourinary: Negative for dysuria, flank pain and hematuria. ; ; Musculoskeletal: +shoulder pain. Negative for back pain and neck pain. Negative for swelling and trauma.; ; Skin: Negative for pruritus, rash, abrasions, blisters, bruising and skin lesion.; ; Neuro: Negative for headache, lightheadedness and neck stiffness. Negative for weakness, altered level of consciousness, altered mental status, extremity weakness, paresthesias, involuntary movement, seizure and syncope.       Physical Exam Updated Vital Signs Pulse 72   Temp 97.8 F (36.6 C) (Oral)   Resp 20   Ht 5\' 7"  (1.702 m)   Wt 64.9 kg (143 lb)   SpO2 100%   BMI 22.40 kg/m   Physical Exam 0920: Physical examination:  Nursing notes reviewed; Vital signs and O2 SAT reviewed;  Constitutional: Well developed, Well nourished, Well hydrated, In no acute distress; Head:  Normocephalic, atraumatic; Eyes: EOMI, PERRL, No scleral icterus; ENMT: Mouth and pharynx normal, Mucous membranes moist; Neck: Supple, Full range of motion, No lymphadenopathy; Cardiovascular: Regular rate and rhythm, No gallop; Respiratory: Breath sounds clear & equal bilaterally, No wheezes.  Normal respiratory effort/excursion; Chest: Nontender, Movement normal; Abdomen: Soft, Nontender, Nondistended, Normal bowel sounds; Genitourinary: No CVA tenderness; Spine:  No midline CS, TS, LS tenderness. +TTP left hypertonic trapezius muscle. No rash.;; Extremities: Pulses normal, NT  left shoulder/elbow/wrist/hand. NMS intact left hand, strong radial pulse. Muscles compartments soft.  No edema, No calf edema or asymmetry.; Neuro: Awake, alert, vague historian. No facial droop. Speech clear. No gross focal motor or sensory deficits in extremities.; Skin: Color normal, Warm, Dry.   ED Treatments / Results  Labs (all labs ordered are listed, but only abnormal results are displayed)   EKG  EKG Interpretation None       Radiology   Procedures Procedures (including critical care time)  Medications Ordered in ED Medications  HYDROcodone-acetaminophen (NORCO/VICODIN) 5-325 MG per tablet 1 tablet (1 tablet Oral Given 06/06/17 0932)     Initial Impression / Assessment and Plan / ED Course  I have reviewed the triage vital signs and the nursing notes.  Pertinent labs & imaging results that were available during my care of the patient were reviewed by me and considered in my medical decision making (see chart for details).  MDM Reviewed: previous chart, nursing note and vitals Interpretation: x-ray and CT scan   Ct Cervical Spine Wo Contrast Result Date: 06/06/2017 CLINICAL DATA:  61 year old  male with left shoulder pain for the past 2 days and tingling in the left arm EXAM: CT CERVICAL SPINE WITHOUT CONTRAST TECHNIQUE: Multidetector CT imaging of the cervical spine was performed without intravenous contrast. Multiplanar CT image reconstructions were also generated. COMPARISON:  None. FINDINGS: Alignment: Normal. Skull base and vertebrae: No acute fracture. No primary bone lesion or focal pathologic process. Soft tissues and spinal canal: No prevertebral fluid or swelling. No visible canal hematoma. Disc levels: Multilevel relatively mild degenerative disc disease. There is a visible posterior disc bulge at C5-C6. Left greater than right foraminal stenosis at C4-C5, C5-C6 and C6-C7. Upper chest: Paraseptal pulmonary emphysema. Other: Significant atherosclerotic  vascular calcifications, particularly in the right carotid bifurcation. IMPRESSION: 1. No evidence of acute fracture or malalignment. 2. Multilevel degenerative disc disease with left greater than right foraminal stenosis at C4-C5, C5-C6 and C6-C7. 3. Multifocal atherosclerotic vascular calcifications most notably in the right common carotid bifurcation. Consider further evaluation with non urgent duplex carotid ultrasound to assess the degree of any underlying stenosis. 4. Paraseptal pulmonary emphysema.  Emphysema (ICD10-J43.9). Electronically Signed   By: Malachy MoanHeath  McCullough M.D.   On: 06/06/2017 10:10   Dg Shoulder Left Result Date: 06/06/2017 CLINICAL DATA:  Three-day history of left-sided neck pain and left shoulder pain. No known injuries. EXAM: LEFT SHOULDER - 2+ VIEW COMPARISON:  None. FINDINGS: No evidence of acute, subacute or healed fractures. Glenohumeral joint intact. Near complete loss of the subacromial space. Acromioclavicular joint intact with mild degenerative changes. IMPRESSION: Near complete loss of the subacromial space indicating chronic supraspinatus tendon disease. Mild degenerative changes [facility] the [facility]. Electronically Signed   By: Hulan Saashomas  Lawrence M.D.   On: 06/06/2017 09:54    1030:  CT/XR without acute process. Tx symptomatically at this time, f/u Ortho MD. Dx and testing d/w pt and family.  Questions answered.  Verb understanding, agreeable to d/c home with outpt f/u.   Final Clinical Impressions(s) / ED Diagnoses   Final diagnoses:  None    ED Discharge Orders    None       Samuel JesterMcManus, Jahnaya Branscome, DO 06/08/17 [id number]

## 2017-06-06 NOTE — Discharge Instructions (Signed)
Take the prescription as directed.  Apply moist heat or ice to the area(s) of discomfort, for 15 minutes at a time, several times per day for the next few days.  Do not fall asleep on a heating or ice pack.  Wear the sling for comfort for the next few days, then remove and slowly return to your usual activities. Your CT scan showed an incidental finding: "Multifocal atherosclerotic vascular calcifications most notably in the right common carotid bifurcation. Consider further evaluation with non urgent duplex carotid ultrasound to assess the degree of any underlying stenosis."  Call your regular medical doctor on Monday to schedule a follow up appointment this week for this finding. Call the Orthopedic doctor on Monday to schedule a follow up appointment within the next week.  Return to the Emergency Department immediately sooner if worsening.

## 2017-06-15 ENCOUNTER — Encounter: Payer: Self-pay | Admitting: Family Medicine

## 2017-06-21 ENCOUNTER — Telehealth: Payer: Self-pay | Admitting: Family Medicine

## 2017-06-24 NOTE — Telephone Encounter (Signed)
[facility] to [facility] they are checking on Rx for the Ensure that was faxed back on 05/17/17

## 2017-06-24 NOTE — Telephone Encounter (Signed)
Left mssg for Azzie to call back so that I can let Azzie know that [facility] is looking into this

## 2017-06-25 NOTE — Telephone Encounter (Signed)
Azzie aware.

## 2017-07-01 ENCOUNTER — Ambulatory Visit (INDEPENDENT_AMBULATORY_CARE_PROVIDER_SITE_OTHER): Payer: Medicaid Other | Admitting: Family Medicine

## 2017-07-01 ENCOUNTER — Encounter: Payer: Self-pay | Admitting: Family Medicine

## 2017-07-01 VITALS — BP 121/64 | HR 79 | Temp 97.2°F | Ht 67.0 in | Wt 138.4 lb

## 2017-07-01 DIAGNOSIS — R413 Other amnesia: Secondary | ICD-10-CM | POA: Diagnosis not present

## 2017-07-01 DIAGNOSIS — J438 Other emphysema: Secondary | ICD-10-CM | POA: Diagnosis not present

## 2017-07-01 DIAGNOSIS — F17200 Nicotine dependence, unspecified, uncomplicated: Secondary | ICD-10-CM | POA: Diagnosis not present

## 2017-07-01 DIAGNOSIS — R634 Abnormal weight loss: Secondary | ICD-10-CM | POA: Diagnosis not present

## 2017-07-01 LAB — URINALYSIS, COMPLETE
Bilirubin, UA: NEGATIVE
Glucose, UA: NEGATIVE
Ketones, UA: NEGATIVE
Leukocytes, UA: NEGATIVE
Nitrite, UA: NEGATIVE
PH UA: 7 (ref 5.0–7.5)
PROTEIN UA: NEGATIVE
Specific Gravity, UA: 1.015 (ref 1.005–1.030)
UUROB: 0.2 mg/dL (ref 0.2–1.0)

## 2017-07-01 LAB — MICROSCOPIC EXAMINATION
BACTERIA UA: NONE SEEN
Epithelial Cells (non renal): NONE SEEN /hpf (ref 0–10)
RENAL EPITHEL UA: NONE SEEN /HPF
WBC UA: NONE SEEN /HPF (ref 0–?)

## 2017-07-01 MED ORDER — HYDROCODONE-ACETAMINOPHEN 5-325 MG PO TABS: ORAL_TABLET | ORAL | 0 refills | Status: DC

## 2017-07-01 NOTE — Patient Instructions (Signed)
Great to meet you!  For Your memory: Try decreasing her Xanax dose to 1/2 tablet during the day. Come back in 1 month for formal memory testing.  For your weight loss I have checked a specific lab, also I would like you to follow-up with a lung doctor. I would also like you to consider a GI appointment for colonoscopy. We are also checking her urine

## 2017-07-01 NOTE — Progress Notes (Signed)
   HPI  Patient presents today here to discuss weight loss and other problems.  His wife is present, she is very concerned about memory loss, weight loss, and his breathing.  He is not willing to quit smoking, he is not willing to really consider slowing down smoking. CT of the cervical spine recently revealed emphysema, with this was discussed today. He does have shortness of breath.  Memory loss His wife is concerned with short-term memory loss.  She states he is also losing memory of events longer ago.  Weight loss Wife reports that he was previously around 160 or 70 pounds. She reports very good appetite and p.o. tolerance. The patient or his wife do not recognize any pro   PMH: Smoking status noted ROS: Per HPI  Objective: BP 121/64   Pulse 79   Temp (!) 97.2 F (36.2 C) (Oral)   Ht 5\' 7"  (1.702 m)   Wt 138 lb 6.4 oz (62.8 kg)   BMI 21.68 kg/m  Gen: NAD, alert, cooperative with exam HEENT: NCAT CV: RRR, good S1/S2, no murmur Resp: CTABL, no wheezes, non-labored Ext: No edema, warm Neuro: Alert and oriented, No gross deficits  Assessment and plan:  #Weight loss Patient with 19 pound weight loss in about 1 year. Recent CT scan finding emphysema Patient has not had colonoscopy, I have recommended them consider this, however they are not willing yet. Urinalysis to rule out hematuria CT scan of the chest to better characterize emphysema With his weight loss and very good appetite I am worried about underlying malignancy  #Emphysema New diagnosis with recent CT scan, patient is a longtime smoker and does have shortness of breath. Refer to pulmonology  #Memory loss Patient complaining of subjective short-term memory loss, primarily his wife is complaining and he agrees. I discussed returning for MMSE, I have also recommended that he try cutting back on Xanax to 1/2 tablet on the daytime pills. They are unwilling to try reduced dose at night and were actually  going to request another sedating medication for sleep.  Patient with severe neck pain and recent CT showing foraminal stenosis I have given him a very small amount of hydrocodone, he has follow-up with orthopedic surgery, Dr. Jillyn HiddenBean, in about 3 weeks  Smoking Recommended cessation, he will consider but is unwilling right now  Orders Placed This Encounter  Procedures  . CT Chest Wo Contrast    Standing Status:   Future    Standing Expiration Date:   08/30/2018    Order Specific Question:   Preferred imaging location?    Answer:   [facility]    Order Specific Question:   Radiology Contrast Protocol - do NOT remove file path    Answer:   [email or web address]  . Urinalysis, Complete  . TSH  . Ambulatory referral to Pulmonology    Referral Priority:   Routine    Referral Type:   Consultation    Referral Reason:   Specialty Services Required    Requested Specialty:   Pulmonary Disease    Number of Visits Requested:   1    Meds ordered this encounter  Medications  . HYDROcodone-acetaminophen (NORCO/VICODIN) 5-325 MG tablet    Sig: 1 or 2 tabs PO q12 hours prn pain    Dispense:  10 tablet    Refill:  0    Murtis SinkSam Billijo Dilling, MD [facility] 07/01/2017, 11:37 AM

## 2017-07-02 LAB — TSH: TSH: 1.74 u[IU]/mL (ref 0.450–4.500)

## 2017-07-09 ENCOUNTER — Telehealth: Payer: Self-pay | Admitting: Family Medicine

## 2017-07-09 NOTE — Telephone Encounter (Signed)
lmtcb

## 2017-07-13 ENCOUNTER — Other Ambulatory Visit: Payer: Self-pay | Admitting: Family Medicine

## 2017-07-19 ENCOUNTER — Ambulatory Visit (INDEPENDENT_AMBULATORY_CARE_PROVIDER_SITE_OTHER): Payer: Medicaid Other

## 2017-07-19 DIAGNOSIS — F329 Major depressive disorder, single episode, unspecified: Secondary | ICD-10-CM

## 2017-07-19 DIAGNOSIS — I69061 Other paralytic syndrome following nontraumatic subarachnoid hemorrhage affecting right dominant side: Secondary | ICD-10-CM | POA: Diagnosis not present

## 2017-07-19 DIAGNOSIS — R32 Unspecified urinary incontinence: Secondary | ICD-10-CM

## 2017-07-22 ENCOUNTER — Ambulatory Visit (INDEPENDENT_AMBULATORY_CARE_PROVIDER_SITE_OTHER)
Admission: RE | Admit: 2017-07-22 | Discharge: 2017-07-22 | Disposition: A | Discharge status: Home / Self Care | Payer: Medicaid Other | Source: Ambulatory Visit | Attending: Emergency Medicine | Admitting: Emergency Medicine

## 2017-07-22 ENCOUNTER — Encounter: Payer: Self-pay | Admitting: Emergency Medicine

## 2017-07-22 ENCOUNTER — Ambulatory Visit: Payer: Medicaid Other | Admitting: Emergency Medicine

## 2017-07-22 DIAGNOSIS — J439 Emphysema, unspecified: Secondary | ICD-10-CM | POA: Diagnosis not present

## 2017-07-22 DIAGNOSIS — Z23 Encounter for immunization: Secondary | ICD-10-CM

## 2017-07-22 DIAGNOSIS — G4733 Obstructive sleep apnea (adult) (pediatric): Secondary | ICD-10-CM

## 2017-07-22 MED ORDER — IPRATROPIUM-ALBUTEROL 0.5-2.5 (3) MG/3ML IN SOLN: 3.0000 mL | Freq: Four times a day (QID) | RESPIRATORY_TRACT | 5 refills | Status: DC

## 2017-07-22 NOTE — Patient Instructions (Addendum)
We will order pulmonary function testing  We will perform a CXR today  We will try changing your nebulizer to DuoNeb four times a day You may still use albuterol nebulized if needed for additional shortness of breath, wheezing, chest tightness.  You would benefit from stopping smoking.  You would benefit from wearing your CPAP reliably.  Flu shot up to date.  Pneumovax today Follow with Dr Delton CoombesByrum in 2 months with PFT on the same day, or sooner if you have any problems.

## 2017-07-22 NOTE — Assessment & Plan Note (Signed)
Continued tobacco abuse.  He has no interest in stopping.  Did recommend to him.  We can discuss trying to at least cut down as we go forward.  I will not refer him for low-dose CT scan lung cancer screening as I do not believe he is a good surgical candidate.  I will get a chest x-ray today.
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
## 2017-07-22 NOTE — Assessment & Plan Note (Addendum)
Agree that he likely does have COPD.  We can quantify his degree of obstruction with pulmonary function testing and is able to perform.  Currently using albuterol nebs on a schedule.  Unclear to me whether he can use inhaler due to his neurological issues.  I would like to keep him on nebs, change him to DuoNeb 4 times daily.  He can still use albuterol as needed.  Flu shot is up-to-date.  He is not clear that he is ever had the Pneumovax but I do not see it in his records.  We will give it today.  He needs to be evaluated for exertional hypoxemia at some point in the future.  I'd like to do this once he is on an optimal bronchodilator regimen.

## 2017-07-22 NOTE — Assessment & Plan Note (Signed)
Untreated.  He has CPAP but is unable to tolerate.

## 2017-07-29 DIAGNOSIS — M1990 Unspecified osteoarthritis, unspecified site: Secondary | ICD-10-CM | POA: Insufficient documentation

## 2017-07-30 ENCOUNTER — Telehealth: Payer: Self-pay | Admitting: Family Medicine

## 2017-07-30 ENCOUNTER — Telehealth: Payer: Self-pay | Admitting: Emergency Medicine

## 2017-07-30 NOTE — Telephone Encounter (Signed)
Message left for patient to contact Dr. Kavin LeechByrum's office.

## 2017-07-30 NOTE — Telephone Encounter (Signed)
RB, please advise on patients xray results.

## 2017-08-02 NOTE — Telephone Encounter (Signed)
Please let him know that his chest x-ray was normal.  No evidence of pneumonia, no evidence of lung cancer.  This is good news.  He still needs to participate in the lung cancer screening program as we discussed at his office visit.

## 2017-08-02 NOTE — Telephone Encounter (Signed)
Spoke with Ezzie and gave her the results from RB. She understood and wanted to cancel his PFT appt and OV with RB. She stated it would be a waste of time because pt stated he would continue to smoke until the day he dies. I advised against this and tried to explain that he would still need to treat his COPD but she refused. RB FYI

## 2017-08-02 NOTE — Telephone Encounter (Signed)
Disagree with this plan - there are probably meds that will help him.

## 2017-08-02 NOTE — Telephone Encounter (Signed)
lmtcb

## 2017-08-09 NOTE — Telephone Encounter (Signed)
Several attempts have been made to contact patient.  

## 2017-08-13 ENCOUNTER — Ambulatory Visit: Payer: Medicaid Other | Admitting: Family Medicine

## 2017-08-13 ENCOUNTER — Other Ambulatory Visit: Payer: Self-pay | Admitting: Family Medicine

## 2017-08-19 ENCOUNTER — Ambulatory Visit (INDEPENDENT_AMBULATORY_CARE_PROVIDER_SITE_OTHER): Payer: Medicaid Other | Admitting: Family Medicine

## 2017-08-19 ENCOUNTER — Encounter: Payer: Self-pay | Admitting: Family Medicine

## 2017-08-19 VITALS — BP 103/66 | HR 78 | Temp 98.4°F | Ht 67.5 in | Wt 135.0 lb

## 2017-08-19 DIAGNOSIS — J439 Emphysema, unspecified: Secondary | ICD-10-CM

## 2017-08-19 DIAGNOSIS — R634 Abnormal weight loss: Secondary | ICD-10-CM | POA: Diagnosis not present

## 2017-08-19 DIAGNOSIS — Z1211 Encounter for screening for malignant neoplasm of colon: Secondary | ICD-10-CM

## 2017-08-19 NOTE — Progress Notes (Signed)
   HPI  Patient presents today follow-up for weight loss.  Since our last visit patient has started drinking Ensure and has gained 1 pound. Try to get a CT to further characterize pulmonary emphysema that was seen on CT neck in December 2018.  This was denied by insurance that he was followed up with pulmonology.  They recommended PFTs, duo nebs, albuterol as needed, and recommended to use his CPAP, also recommended stopping smoking.  Patient is still not interested in stopping smoking.  They would like the ologuard rather than a colonoscopy.    PMH: Smoking status noted ROS: Per HPI  Objective: BP 103/66   Pulse 78   Temp 98.4 F (36.9 C) (Oral)   Ht 5' 7.5" (1.715 m)   Wt 135 lb (61.2 kg)   BMI 20.83 kg/m  Gen: NAD, alert, cooperative with exam HEENT: NCAT CV: RRR, good S1/S2, no murmur Resp: CTABL, no wheezes, non-labored Ext: No edema, warm Neuro: Alert and oriented, No gross deficits  Assessment and plan:  # Weight loss Gained 1 lb. Continue Ensure Recommend Colon cancer screening- they choose cologuard.   # Emphysema Seen On CT Bronchodilators per pulmonology- appreciate their evaluation and recommendations Recommend stopping smoking.    Murtis SinkSam Karalynn Cottone, MD [facility] 08/19/2017, 10:26 AM

## 2017-08-24 DIAGNOSIS — M5030 Other cervical disc degeneration, unspecified cervical region: Secondary | ICD-10-CM | POA: Insufficient documentation

## 2017-09-16 ENCOUNTER — Telehealth: Payer: Self-pay | Admitting: Family Medicine

## 2017-09-16 NOTE — Telephone Encounter (Signed)
Attempted to contact patient - NA °

## 2017-09-20 ENCOUNTER — Ambulatory Visit: Payer: Medicaid Other | Admitting: Emergency Medicine

## 2017-09-28 NOTE — Telephone Encounter (Signed)
No return call from pt - closing chart

## 2017-11-10 ENCOUNTER — Other Ambulatory Visit: Payer: Self-pay | Admitting: Family Medicine

## 2017-11-10 ENCOUNTER — Ambulatory Visit (INDEPENDENT_AMBULATORY_CARE_PROVIDER_SITE_OTHER): Payer: Medicaid Other

## 2017-11-10 DIAGNOSIS — R32 Unspecified urinary incontinence: Secondary | ICD-10-CM

## 2017-11-10 DIAGNOSIS — I69061 Other paralytic syndrome following nontraumatic subarachnoid hemorrhage affecting right dominant side: Secondary | ICD-10-CM

## 2017-11-10 DIAGNOSIS — F329 Major depressive disorder, single episode, unspecified: Secondary | ICD-10-CM | POA: Diagnosis not present

## 2017-11-12 ENCOUNTER — Encounter: Payer: Self-pay | Admitting: Family Medicine

## 2017-11-12 ENCOUNTER — Ambulatory Visit (INDEPENDENT_AMBULATORY_CARE_PROVIDER_SITE_OTHER): Payer: Medicaid Other | Admitting: Family Medicine

## 2017-11-12 VITALS — BP 94/58 | HR 82 | Temp 97.6°F | Ht 67.5 in | Wt 139.5 lb

## 2017-11-12 DIAGNOSIS — E782 Mixed hyperlipidemia: Secondary | ICD-10-CM | POA: Diagnosis not present

## 2017-11-12 DIAGNOSIS — Z720 Tobacco use: Secondary | ICD-10-CM | POA: Diagnosis not present

## 2017-11-12 DIAGNOSIS — F411 Generalized anxiety disorder: Secondary | ICD-10-CM

## 2017-11-12 DIAGNOSIS — I10 Essential (primary) hypertension: Secondary | ICD-10-CM

## 2017-11-12 LAB — URINALYSIS
BILIRUBIN UA: NEGATIVE
Glucose, UA: NEGATIVE
Ketones, UA: NEGATIVE
Leukocytes, UA: NEGATIVE
Nitrite, UA: NEGATIVE
PH UA: 7.5 (ref 5.0–7.5)
PROTEIN UA: NEGATIVE
SPEC GRAV UA: 1.01 (ref 1.005–1.030)
UUROB: 0.2 mg/dL (ref 0.2–1.0)

## 2017-11-12 MED ORDER — ESOMEPRAZOLE MAGNESIUM 40 MG PO CPDR: DELAYED_RELEASE_CAPSULE | ORAL | 1 refills | Status: DC

## 2017-11-12 MED ORDER — FLUTICASONE PROPIONATE 50 MCG/ACT NA SUSP: 1.0000 | Freq: Every day | NASAL | 5 refills | Status: DC

## 2017-11-12 MED ORDER — ALBUTEROL SULFATE (2.5 MG/3ML) 0.083% IN NEBU: INHALATION_SOLUTION | RESPIRATORY_TRACT | 1 refills | Status: DC

## 2017-11-12 MED ORDER — ALBUTEROL SULFATE HFA 108 (90 BASE) MCG/ACT IN AERS: INHALATION_SPRAY | RESPIRATORY_TRACT | 3 refills | Status: DC

## 2017-11-12 MED ORDER — METOPROLOL SUCCINATE ER 25 MG PO TB24: 25.0000 mg | ORAL_TABLET | Freq: Every day | ORAL | 1 refills | Status: DC

## 2017-11-12 MED ORDER — SOLIFENACIN SUCCINATE 5 MG PO TABS: ORAL_TABLET | ORAL | 1 refills | Status: DC

## 2017-11-12 MED ORDER — MUPIROCIN 2 % EX OINT: TOPICAL_OINTMENT | CUTANEOUS | 5 refills | Status: DC

## 2017-11-13 LAB — CBC WITH DIFFERENTIAL/PLATELET
BASOS ABS: 0 10*3/uL (ref 0.0–0.2)
BASOS: 1 %
EOS (ABSOLUTE): 0.1 10*3/uL (ref 0.0–0.4)
Eos: 1 %
HEMOGLOBIN: 12.6 g/dL — AB (ref 13.0–17.7)
Hematocrit: 37.5 % (ref 37.5–51.0)
IMMATURE GRANS (ABS): 0 10*3/uL (ref 0.0–0.1)
Immature Granulocytes: 0 %
LYMPHS ABS: 1.9 10*3/uL (ref 0.7–3.1)
Lymphs: 25 %
MCH: 32.6 pg (ref 26.6–33.0)
MCHC: 33.6 g/dL (ref 31.5–35.7)
MCV: 97 fL (ref 79–97)
Monocytes Absolute: 0.7 10*3/uL (ref 0.1–0.9)
Monocytes: 9 %
NEUTROS ABS: 4.8 10*3/uL (ref 1.4–7.0)
Neutrophils: 64 %
PLATELETS: 235 10*3/uL (ref 150–450)
RBC: 3.87 x10E6/uL — ABNORMAL LOW (ref 4.14–5.80)
RDW: 13.6 % (ref 12.3–15.4)
WBC: 7.5 10*3/uL (ref 3.4–10.8)

## 2017-11-13 LAB — LIPID PANEL
CHOL/HDL RATIO: 2.3 ratio (ref 0.0–5.0)
Cholesterol, Total: 105 mg/dL (ref 100–199)
HDL: 46 mg/dL (ref >39)
LDL CALC: 46 mg/dL (ref 0–99)
Triglycerides: 65 mg/dL (ref 0–149)
VLDL CHOLESTEROL CAL: 13 mg/dL (ref 5–40)

## 2017-11-13 LAB — CMP14+EGFR
A/G RATIO: 1.7 (ref 1.2–2.2)
ALBUMIN: 4 g/dL (ref 3.6–4.8)
ALT: 14 IU/L (ref 0–44)
AST: 16 IU/L (ref 0–40)
Alkaline Phosphatase: 71 IU/L (ref 39–117)
BILIRUBIN TOTAL: 0.4 mg/dL (ref 0.0–1.2)
BUN/Creatinine Ratio: 16 (ref 10–24)
BUN: 13 mg/dL (ref 8–27)
CO2: 23 mmol/L (ref 20–29)
CREATININE: 0.79 mg/dL (ref 0.76–1.27)
Calcium: 9.2 mg/dL (ref 8.6–10.2)
Chloride: 103 mmol/L (ref 96–106)
GFR calc Af Amer: 110 mL/min/{1.73_m2} (ref >59)
GFR, EST NON AFRICAN AMERICAN: 96 mL/min/{1.73_m2} (ref >59)
GLUCOSE: 108 mg/dL — AB (ref 65–99)
Globulin, Total: 2.4 g/dL (ref 1.5–4.5)
Potassium: 4.1 mmol/L (ref 3.5–5.2)
SODIUM: 140 mmol/L (ref 134–144)
TOTAL PROTEIN: 6.4 g/dL (ref 6.0–8.5)

## 2017-11-13 LAB — TSH: TSH: 1.16 u[IU]/mL (ref 0.450–4.500)

## 2017-11-19 ENCOUNTER — Encounter: Payer: Self-pay | Admitting: Family Medicine
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
## 2017-12-14 ENCOUNTER — Other Ambulatory Visit: Payer: Self-pay | Admitting: Family Medicine

## 2017-12-22 ENCOUNTER — Ambulatory Visit: Payer: Medicaid Other | Admitting: Family Medicine

## 2017-12-22 ENCOUNTER — Telehealth: Payer: Self-pay | Admitting: Family Medicine

## 2017-12-22 NOTE — Telephone Encounter (Signed)
Consider stopping niacin:   Per Patrick ManningMichelle Bryant, PharmD:    I was asked to complete a medication review on this patient for my job at [employer] [facility]). I would recommend stopping niacin based on clinical trial data and the ACC/AHA and NLA recommendations against the use of niacin for elevated TGs. Statin and then fibrates or O3FA are recommended if TGs are over 500mg /dL. Patient's TGs and HDL have been at goal since 2015 labs. I can't find the reason he was originally prescribed it.   Patrick SinkSam Bradshaw, MD [facility] 12/22/2017, 1:58 PM

## 2018-01-07 ENCOUNTER — Other Ambulatory Visit: Payer: Self-pay | Admitting: Emergency Medicine

## 2018-02-04 ENCOUNTER — Other Ambulatory Visit: Payer: Self-pay | Admitting: *Deleted

## 2018-02-04 MED ORDER — ALPRAZOLAM 1 MG PO TABS: ORAL_TABLET | ORAL | 0 refills | Status: DC

## 2018-02-04 MED ORDER — PRAMIPEXOLE DIHYDROCHLORIDE 0.5 MG PO TABS: ORAL_TABLET | ORAL | 0 refills | Status: DC

## 2018-02-22 ENCOUNTER — Ambulatory Visit (INDEPENDENT_AMBULATORY_CARE_PROVIDER_SITE_OTHER): Payer: Medicaid Other

## 2018-02-22 DIAGNOSIS — R32 Unspecified urinary incontinence: Secondary | ICD-10-CM | POA: Diagnosis not present

## 2018-02-22 DIAGNOSIS — F329 Major depressive disorder, single episode, unspecified: Secondary | ICD-10-CM | POA: Diagnosis not present

## 2018-02-22 DIAGNOSIS — I69061 Other paralytic syndrome following nontraumatic subarachnoid hemorrhage affecting right dominant side: Secondary | ICD-10-CM

## 2018-03-04 ENCOUNTER — Other Ambulatory Visit: Payer: Self-pay | Admitting: Family Medicine

## 2018-03-07 NOTE — Telephone Encounter (Signed)
I have never actually seen this patient for this, only saw him once for an acute visit, he did see Dr. Darlyn ReadStacks for this, we can send this request to Dr. Darlyn ReadStacks and see if he feels comfortable, otherwise patient needs an appointment.

## 2018-03-07 NOTE — Telephone Encounter (Signed)
Pt. Needs to be seen for this. Thanks, WS 

## 2018-03-09 ENCOUNTER — Telehealth: Payer: Self-pay | Admitting: Family Medicine

## 2018-03-09 NOTE — Telephone Encounter (Signed)
I have never seen this patient, he has been seen by Dr. Darlyn Read, if Dr. Darlyn Read knowing what he knows of the patient feels that it is okay to give him a refill until that date then I am fine with that but since I have never seen this patient I cannot make this call.

## 2018-03-09 NOTE — Telephone Encounter (Signed)
Please review and advise.

## 2018-03-09 NOTE — Telephone Encounter (Signed)
Patient was last seen 5/31 by Dr. Darlyn Read and  Xanax was refilled 8/23 #90 R-0. Has appointment with [facility] 9/30- requesting a refill until apt. Please advise

## 2018-03-09 NOTE — Telephone Encounter (Signed)
I already went beyond what I was comfortable doing with the last prescription.  I cannot do this again.  Thanks, WS.

## 2018-03-10 NOTE — Telephone Encounter (Signed)
Aware- must have appt - has appt MON

## 2018-03-14 ENCOUNTER — Ambulatory Visit (INDEPENDENT_AMBULATORY_CARE_PROVIDER_SITE_OTHER): Payer: Medicaid Other | Admitting: Family Medicine

## 2018-03-14 ENCOUNTER — Encounter: Payer: Self-pay | Admitting: Family Medicine

## 2018-03-14 VITALS — BP 129/73 | HR 95 | Temp 97.4°F | Ht 67.5 in | Wt 140.0 lb

## 2018-03-14 DIAGNOSIS — K2100 Gastro-esophageal reflux disease with esophagitis, without bleeding: Secondary | ICD-10-CM

## 2018-03-14 DIAGNOSIS — I10 Essential (primary) hypertension: Secondary | ICD-10-CM | POA: Diagnosis not present

## 2018-03-14 DIAGNOSIS — F339 Major depressive disorder, recurrent, unspecified: Secondary | ICD-10-CM

## 2018-03-14 DIAGNOSIS — F411 Generalized anxiety disorder: Secondary | ICD-10-CM

## 2018-03-14 DIAGNOSIS — J439 Emphysema, unspecified: Secondary | ICD-10-CM

## 2018-03-14 DIAGNOSIS — E782 Mixed hyperlipidemia: Secondary | ICD-10-CM | POA: Diagnosis not present

## 2018-03-14 DIAGNOSIS — Z79899 Other long term (current) drug therapy: Secondary | ICD-10-CM | POA: Insufficient documentation

## 2018-03-14 MED ORDER — ALPRAZOLAM 1 MG PO TABS: ORAL_TABLET | ORAL | 2 refills | Status: DC

## 2018-03-14 NOTE — Progress Notes (Signed)
BP 129/73   Pulse 95   Temp (!) 97.4 F (36.3 C) (Oral)   Ht 5' 7.5" (1.715 m)   Wt 140 lb (63.5 kg)   BMI 21.60 kg/m    Subjective:    Patient ID: [name], male    DOB: [birth year], 62 y.o.   MRN: [id number]  HPI: [name] is a 62 y.o. male presenting on 03/14/2018 for Hyperlipidemia (4 month follow up) and Hypertension   HPI Hypertension Patient is currently on metoprolol, and their blood pressure today is 129/73. Patient denies any lightheadedness or dizziness. Patient denies headaches, blurred vision, chest pains, shortness of breath, or weakness. Denies any side effects from medication and is content with current medication.   Hyperlipidemia Patient is coming in for recheck of his hyperlipidemia. The patient is currently taking simvastatin. They deny any issues with myalgias or history of liver damage from it. They deny any focal numbness or weakness or chest pain.   GERD Patient is currently on Nexium.  She denies any major symptoms or abdominal pain or belching or burping. She denies any blood in her stool or lightheadedness or dizziness.   Anxiety and depression Patient is coming in to discuss anxiety and depression as well.  He needs a refill on his alprazolam which she is 1 mg up to 3 times a day as needed and wants a refill for this.  He did sign a pain contract with [place] today and we discussed that terms of it and his daughter who is here with him helps control the dosing for this so he does not have any major issues.  She denies him having any suicidal ideations or thoughts of hurting himself.  Patient also is on Risperdal and Mirapex to help with mood and restless leg in the bedtime and help with sleeping which is been a big issue for him. Depression screen [facility] 2/9 03/14/2018 11/12/2017 08/19/2017 07/01/2017 06/04/2017  Decreased Interest 0 1 0 0 0  Down, Depressed, Hopeless 1 3 0 1 0  PHQ - 2 Score 1 4 0 1 0  Altered sleeping - 1 - - -  Tired,  decreased energy - 3 - - -  Change in appetite - 2 - - -  Feeling bad or failure about yourself  - 1 - - -  Trouble concentrating - 1 - - -  Moving slowly or fidgety/restless - 3 - - -  Suicidal thoughts - 0 - - -  PHQ-9 Score - 15 - - -     COPD Patient is coming in for COPD recheck today.  He is currently on albuterol and DuoNeb.  He has a mild chronic cough but denies any major coughing spells or wheezing spells.  He has 0nighttime symptoms per week and 1daytime symptoms per week currently.   Relevant past medical, surgical, family and social history reviewed and updated as indicated. Interim medical history since our last visit reviewed. Allergies and medications reviewed and updated.  Review of Systems  Constitutional: Negative for chills and fever.  Eyes: Negative for visual disturbance.  Respiratory: Positive for cough. Negative for shortness of breath and wheezing.   Cardiovascular: Negative for chest pain and leg swelling.  Musculoskeletal: Negative for back pain and gait problem.  Skin: Negative for rash.  Neurological: Negative for dizziness, weakness and light-headedness.  Psychiatric/Behavioral: Positive for decreased concentration, dysphoric mood and sleep disturbance. Negative for self-injury and suicidal ideas. The patient is nervous/anxious.   All other systems  reviewed and are negative.   Per HPI unless specifically indicated above   Allergies as of 03/14/2018      Reactions   Clonidine    REACTION: rash      Medication List        Accurate as of 03/14/18 10:52 AM. Always use your most recent med list.          albuterol 108 (90 Base) MCG/ACT inhaler Commonly known as:  PROVENTIL HFA;VENTOLIN HFA 2 PUFFS EVERY 6 HOURS AS NEEDED FOR WHEEZING OR SHORTNESS OF BREATH   albuterol (2.5 MG/3ML) 0.083% nebulizer solution Commonly known as:  PROVENTIL USE 1 VIAL IN NEBULIZER 4 TIMES A DAY AS NEEDED   ALPRAZolam 1 MG tablet Commonly known as:  XANAX TAKE   (1)  TABLET  THREE TIMES DAILY as needed.   aspirin 81 MG chewable tablet Chew 81 mg by mouth daily.   esomeprazole 40 MG capsule Commonly known as:  NEXIUM TAKE (1) CAPSULE DAILY   fluticasone 50 MCG/ACT nasal spray Commonly known as:  FLONASE Place 1 spray into both nostrils daily.   HYDROcodone-acetaminophen 5-325 MG tablet Commonly known as:  NORCO/VICODIN 1 or 2 tabs PO q12 hours prn pain   ipratropium-albuterol 0.5-2.5 (3) MG/3ML Soln Commonly known as:  DUONEB Take 3 mLs by nebulization 4 (four) times daily.   metoprolol succinate 25 MG 24 hr tablet Commonly known as:  TOPROL-XL Take 1 tablet (25 mg total) by mouth daily.   mupirocin ointment 2 % Commonly known as:  BACTROBAN PLACE 1 APPLICATION INTO THE NOSE 2 TIMES A DAY   pramipexole 0.5 MG tablet Commonly known as:  MIRAPEX TAKE ONE TABLET AT BEDTIME   risperiDONE 2 MG tablet Commonly known as:  RISPERDAL TAKE ONE TABLET AT BEDTIME   simvastatin 20 MG tablet Commonly known as:  ZOCOR TAKE ONE TABLET AT BEDTIME   solifenacin 5 MG tablet Commonly known as:  VESICARE TAKE (2) TABLETS DAILY          Objective:    BP 129/73   Pulse 95   Temp (!) 97.4 F (36.3 C) (Oral)   Ht 5' 7.5" (1.715 m)   Wt 140 lb (63.5 kg)   BMI 21.60 kg/m   Wt Readings from Last 3 Encounters:  03/14/18 140 lb (63.5 kg)  11/12/17 139 lb 8 oz (63.3 kg)  08/19/17 135 lb (61.2 kg)    Physical Exam  Constitutional: He is oriented to person, place, and time. He appears well-developed and well-nourished. No distress.  Eyes: Conjunctivae are normal. No scleral icterus.  Neck: Neck supple. No thyromegaly present.  Cardiovascular: Normal rate, regular rhythm, normal heart sounds and intact distal pulses.  No murmur heard. Pulmonary/Chest: Effort normal and breath sounds normal. No respiratory distress. He has no wheezes.  Musculoskeletal: Normal range of motion. He exhibits no edema.  Lymphadenopathy:    He has no cervical  adenopathy.  Neurological: He is alert and oriented to person, place, and time. Coordination normal.  Skin: Skin is warm and dry. No rash noted. He is not diaphoretic.  Psychiatric: His behavior is normal. His mood appears anxious. Cognition and memory are impaired. He exhibits a depressed mood. He expresses no suicidal ideation. He expresses no suicidal plans.  Nursing note and vitals reviewed.       Assessment & Plan:   Problem List Items Addressed This Visit      Cardiovascular and Mediastinum   Essential hypertension - Primary     Respiratory  COPD (chronic obstructive pulmonary disease) (HCC)     Digestive   GERD     Other   Hyperlipidemia   Generalized anxiety disorder   Relevant Medications   ALPRAZolam (XANAX) 1 MG tablet   Depression, recurrent (HCC)   Relevant Medications   ALPRAZolam (XANAX) 1 MG tablet   Controlled substance agreement signed      Continue alprazolam, patient had controlled substance contract signed Follow up plan: Return in about 3 months (around 06/13/2018), or if symptoms worsen or fail to improve, for Recheck anxiety and hypertension depression.  Counseling provided for all of the vaccine components No orders of the defined types were placed in this encounter.   Arville Care, MD [facility] 03/14/2018, 10:52 AM

## 2018-04-06 ENCOUNTER — Other Ambulatory Visit: Payer: Self-pay | Admitting: Emergency Medicine

## 2018-04-06 ENCOUNTER — Other Ambulatory Visit: Payer: Self-pay | Admitting: Family Medicine

## 2018-05-05 ENCOUNTER — Other Ambulatory Visit: Payer: Self-pay | Admitting: *Deleted

## 2018-05-05 MED ORDER — SIMVASTATIN 20 MG PO TABS: ORAL_TABLET | ORAL | 0 refills | Status: DC

## 2018-05-05 MED ORDER — RISPERIDONE 2 MG PO TABS: 2.0000 mg | ORAL_TABLET | Freq: Every day | ORAL | 0 refills | Status: DC

## 2018-05-05 NOTE — Telephone Encounter (Signed)
Next OV 07/14/18

## 2018-05-06 ENCOUNTER — Other Ambulatory Visit: Payer: Self-pay | Admitting: Family Medicine

## 2018-05-06 ENCOUNTER — Other Ambulatory Visit: Payer: Self-pay | Admitting: Emergency Medicine

## 2018-05-09 ENCOUNTER — Ambulatory Visit (INDEPENDENT_AMBULATORY_CARE_PROVIDER_SITE_OTHER): Payer: Medicaid Other

## 2018-05-09 DIAGNOSIS — R32 Unspecified urinary incontinence: Secondary | ICD-10-CM | POA: Diagnosis not present

## 2018-05-09 DIAGNOSIS — I69061 Other paralytic syndrome following nontraumatic subarachnoid hemorrhage affecting right dominant side: Secondary | ICD-10-CM | POA: Diagnosis not present

## 2018-05-09 DIAGNOSIS — F329 Major depressive disorder, single episode, unspecified: Secondary | ICD-10-CM | POA: Diagnosis not present

## 2018-05-13 ENCOUNTER — Telehealth: Payer: Self-pay | Admitting: Family Medicine

## 2018-05-13 ENCOUNTER — Other Ambulatory Visit: Payer: Self-pay | Admitting: Family Medicine

## 2018-05-13 MED ORDER — NIASPAN 1000 MG PO TBCR: 1000.0000 mg | EXTENDED_RELEASE_TABLET | Freq: Every day | ORAL | 0 refills | Status: DC

## 2018-05-13 NOTE — Telephone Encounter (Signed)
I cannot see why it would be discontinued, go ahead and send him a refill.  It looks like the discontinue order was by Dr. Darlyn ReadStacks but I cannot see why.

## 2018-05-13 NOTE — Telephone Encounter (Signed)
Do not see stopping Niacin mention in chart.  It is no longer on medication list.  Please advise.

## 2018-05-13 NOTE — Telephone Encounter (Signed)
[name] is calling for pt wants to know why the dr took him off of the nisapan

## 2018-05-13 NOTE — Telephone Encounter (Signed)
Refill sent, patient's sister Azzie notified.

## 2018-05-16 ENCOUNTER — Ambulatory Visit: Payer: Medicaid Other | Admitting: Family Medicine

## 2018-06-06 ENCOUNTER — Other Ambulatory Visit: Payer: Self-pay | Admitting: Family Medicine

## 2018-06-06 ENCOUNTER — Other Ambulatory Visit: Payer: Self-pay | Admitting: Emergency Medicine

## 2018-06-09 NOTE — Telephone Encounter (Signed)
Last seen 03/14/18

## 2018-07-06 ENCOUNTER — Other Ambulatory Visit: Payer: Self-pay | Admitting: Emergency Medicine

## 2018-07-06 ENCOUNTER — Other Ambulatory Visit: Payer: Self-pay | Admitting: Family

## 2018-07-07 NOTE — Telephone Encounter (Signed)
I will refill it like this the one time but from here on out the office visits need to be scheduled before he runs out because this is a controlled substance, from here on out they can only be refilled in office visits.

## 2018-07-08 ENCOUNTER — Other Ambulatory Visit: Payer: Self-pay | Admitting: *Deleted

## 2018-07-08 MED ORDER — IPRATROPIUM-ALBUTEROL 0.5-2.5 (3) MG/3ML IN SOLN: 3.0000 mL | Freq: Four times a day (QID) | RESPIRATORY_TRACT | 0 refills | Status: DC

## 2018-07-14 ENCOUNTER — Ambulatory Visit: Payer: Medicaid Other | Admitting: Family Medicine

## 2018-07-30 ENCOUNTER — Other Ambulatory Visit: Payer: Self-pay | Admitting: Family Medicine

## 2018-08-01 ENCOUNTER — Ambulatory Visit (INDEPENDENT_AMBULATORY_CARE_PROVIDER_SITE_OTHER): Payer: Medicaid Other | Admitting: Family Medicine

## 2018-08-01 ENCOUNTER — Encounter: Payer: Self-pay | Admitting: Family Medicine

## 2018-08-01 VITALS — BP 110/70 | HR 83 | Temp 97.3°F | Ht 67.5 in | Wt 137.0 lb

## 2018-08-01 DIAGNOSIS — I10 Essential (primary) hypertension: Secondary | ICD-10-CM | POA: Diagnosis not present

## 2018-08-01 DIAGNOSIS — J439 Emphysema, unspecified: Secondary | ICD-10-CM | POA: Diagnosis not present

## 2018-08-01 DIAGNOSIS — E782 Mixed hyperlipidemia: Secondary | ICD-10-CM

## 2018-08-01 DIAGNOSIS — F339 Major depressive disorder, recurrent, unspecified: Secondary | ICD-10-CM

## 2018-08-01 MED ORDER — SOLIFENACIN SUCCINATE 5 MG PO TABS: ORAL_TABLET | ORAL | 3 refills | Status: DC

## 2018-08-01 MED ORDER — ALPRAZOLAM 1 MG PO TABS: 1.0000 mg | ORAL_TABLET | Freq: Three times a day (TID) | ORAL | 2 refills | Status: DC | PRN

## 2018-08-01 MED ORDER — QUETIAPINE FUMARATE ER 150 MG PO TB24: 150.0000 mg | ORAL_TABLET | Freq: Every day | ORAL | 2 refills | Status: DC

## 2018-08-01 NOTE — Progress Notes (Signed)
BP 110/70   Pulse 83   Temp (!) 97.3 F (36.3 C) (Oral)   Ht 5' 7.5" (1.715 m)   Wt 137 lb (62.1 kg)   BMI 21.14 kg/m    Subjective:    Patient ID: [name], male    DOB: [birth year], 63 y.o.   MRN: [id number]  HPI: [name] is a 63 y.o. male presenting on 08/01/2018 for Anxiety (4 month follow up- Patient states he is still having trouble sleeping) and Hyperlipidemia   HPI Hyperlipidemia Patient is coming in for recheck of his hyperlipidemia. The patient is currently taking simvastatin. They deny any issues with myalgias or history of liver damage from it. They deny any focal numbness or weakness or chest pain.   Hypertension Patient is currently on metoprolol, and their blood pressure today is metoprolol. Patient denies any lightheadedness or dizziness. Patient denies headaches, blurred vision, chest pains, shortness of breath, or weakness. Denies any side effects from medication and is content with current medication.   COPD Patient is coming in for COPD recheck today.  He is currently on DuoNeb and Flonase and albuterol.  He has a mild chronic cough but denies any major coughing spells or wheezing spells.  He has 0nighttime symptoms per week and 0daytime symptoms per week currently.  Depression and insomnia Patient is coming in for depression and anxiety and insomnia recheck.  Patient is currently taking Risperdal and alprazolam and says that his biggest issue is that he is just not sleeping well.  He says his depression anxiety is mostly controlled but just not sleeping well.  He is also here with a family member who is providing some of his history as well.  He was taking alprazolam to help sleep but just does not seem to be helping as much but he is still using it for anxiety during the daytime. Depression screen [facility] 2/9 08/01/2018 03/14/2018 11/12/2017 08/19/2017 07/01/2017  Decreased Interest 0 0 1 0 0  Down, Depressed, Hopeless 0 1 3 0 1  PHQ - 2 Score 0 1 4  0 1  Altered sleeping - - 1 - -  Tired, decreased energy - - 3 - -  Change in appetite - - 2 - -  Feeling bad or failure about yourself  - - 1 - -  Trouble concentrating - - 1 - -  Moving slowly or fidgety/restless - - 3 - -  Suicidal thoughts - - 0 - -  PHQ-9 Score - - 15 - -     Relevant past medical, surgical, family and social history reviewed and updated as indicated. Interim medical history since our last visit reviewed. Allergies and medications reviewed and updated.  Review of Systems  Constitutional: Negative for chills and fever.  Eyes: Negative for visual disturbance.  Respiratory: Negative for shortness of breath and wheezing.   Cardiovascular: Negative for chest pain and leg swelling.  Musculoskeletal: Negative for back pain and gait problem.  Skin: Negative for rash.  Neurological: Negative for dizziness, weakness, light-headedness and numbness.  Psychiatric/Behavioral: Positive for decreased concentration and dysphoric mood. Negative for self-injury, sleep disturbance and suicidal ideas. The patient is nervous/anxious.   All other systems reviewed and are negative.   Per HPI unless specifically indicated above   Allergies as of 08/01/2018      Reactions   Clonidine    REACTION: rash      Medication List       Accurate as of August 01, 2018  11:59 PM. Always use your most recent med list.        albuterol 108 (90 Base) MCG/ACT inhaler Commonly known as:  PROAIR HFA 2 PUFFS EVERY 6 HOURS AS NEEDED FOR WHEEZING OR SHORTNESS OF BREATH   ALPRAZolam 1 MG tablet Commonly known as:  XANAX Take 1 tablet (1 mg total) by mouth 3 (three) times daily as needed for anxiety.   aspirin 81 MG chewable tablet Chew 81 mg by mouth daily.   esomeprazole 40 MG capsule Commonly known as:  NEXIUM TAKE (1) CAPSULE DAILY   fluticasone 50 MCG/ACT nasal spray Commonly known as:  FLONASE Place 1 spray into both nostrils daily.   ipratropium-albuterol 0.5-2.5 (3) MG/3ML  Soln Commonly known as:  DUONEB Take 3 mLs by nebulization 4 (four) times daily.   metoprolol succinate 25 MG 24 hr tablet Commonly known as:  TOPROL-XL Take 1 tablet (25 mg total) by mouth daily.   mupirocin ointment 2 % Commonly known as:  BACTROBAN PLACE 1 APPLICATION INTO THE NOSE 2 TIMES A DAY   pramipexole 0.5 MG tablet Commonly known as:  MIRAPEX TAKE ONE TABLET AT BEDTIME   QUEtiapine Fumarate 150 MG 24 hr tablet Commonly known as:  SEROQUEL XR Take 1 tablet (150 mg total) by mouth at bedtime.   simvastatin 20 MG tablet Commonly known as:  ZOCOR TAKE ONE TABLET AT BEDTIME   solifenacin 5 MG tablet Commonly known as:  VESICARE TAKE (2) TABLETS DAILY          Objective:    BP 110/70   Pulse 83   Temp (!) 97.3 F (36.3 C) (Oral)   Ht 5' 7.5" (1.715 m)   Wt 137 lb (62.1 kg)   BMI 21.14 kg/m   Wt Readings from Last 3 Encounters:  08/01/18 137 lb (62.1 kg)  03/14/18 140 lb (63.5 kg)  11/12/17 139 lb 8 oz (63.3 kg)    Physical Exam Vitals signs and nursing note reviewed.  Constitutional:      General: He is not in acute distress.    Appearance: He is well-developed. He is not diaphoretic.  Eyes:     General: No scleral icterus.    Conjunctiva/sclera: Conjunctivae normal.  Neck:     Musculoskeletal: Neck supple.     Thyroid: No thyromegaly.  Cardiovascular:     Rate and Rhythm: Normal rate and regular rhythm.     Heart sounds: Normal heart sounds. No murmur.  Pulmonary:     Effort: Pulmonary effort is normal. No respiratory distress.     Breath sounds: Normal breath sounds. No wheezing.  Musculoskeletal: Normal range of motion.  Lymphadenopathy:     Cervical: No cervical adenopathy.  Skin:    General: Skin is warm and dry.     Findings: No rash.  Neurological:     Mental Status: He is alert and oriented to person, place, and time.     Coordination: Coordination normal.  Psychiatric:        Mood and Affect: Mood is anxious and depressed.         Behavior: Behavior normal.        Thought Content: Thought content does not include suicidal ideation. Thought content does not include suicidal plan.       Assessment & Plan:   Problem List Items Addressed This Visit      Cardiovascular and Mediastinum   Essential hypertension   Relevant Orders   CMP14+EGFR (Completed)     Respiratory  COPD (chronic obstructive pulmonary disease) (HCC)     Other   Hyperlipidemia   Relevant Orders   Lipid panel (Completed)   Depression, recurrent (HCC) - Primary   Relevant Medications   ALPRAZolam (XANAX) 1 MG tablet   QUEtiapine Fumarate (SEROQUEL XR) 150 MG 24 hr tablet      Start Seroquel up for insomnia and depression, stop the Risperdal, continue the alprazolam for now.  Follow up plan: Return in about 3 months (around 10/30/2018), or if symptoms worsen or fail to improve, for Anxiety and depression.  Counseling provided for all of the vaccine components Orders Placed This Encounter  Procedures  . CMP14+EGFR  . Lipid panel    Caryl Pina, MD [facility] 08/06/2018, 4:08 PM

## 2018-08-02 LAB — CMP14+EGFR
ALT: 14 IU/L (ref 0–44)
AST: 18 IU/L (ref 0–40)
Albumin/Globulin Ratio: 1.9 (ref 1.2–2.2)
Albumin: 4.5 g/dL (ref 3.8–4.8)
Alkaline Phosphatase: 84 IU/L (ref 39–117)
BUN/Creatinine Ratio: 11 (ref 10–24)
BUN: 9 mg/dL (ref 8–27)
Bilirubin Total: 0.3 mg/dL (ref 0.0–1.2)
CO2: 22 mmol/L (ref 20–29)
CREATININE: 0.79 mg/dL (ref 0.76–1.27)
Calcium: 9.3 mg/dL (ref 8.6–10.2)
Chloride: 102 mmol/L (ref 96–106)
GFR calc Af Amer: 110 mL/min/{1.73_m2} (ref >59)
GFR calc non Af Amer: 95 mL/min/{1.73_m2} (ref >59)
Globulin, Total: 2.4 g/dL (ref 1.5–4.5)
Glucose: 100 mg/dL — ABNORMAL HIGH (ref 65–99)
Potassium: 4.4 mmol/L (ref 3.5–5.2)
Sodium: 140 mmol/L (ref 134–144)
Total Protein: 6.9 g/dL (ref 6.0–8.5)

## 2018-08-02 LAB — LIPID PANEL
Chol/HDL Ratio: 2.1 ratio (ref 0.0–5.0)
Cholesterol, Total: 111 mg/dL (ref 100–199)
HDL: 54 mg/dL (ref >39)
LDL CALC: 42 mg/dL (ref 0–99)
Triglycerides: 74 mg/dL (ref 0–149)
VLDL Cholesterol Cal: 15 mg/dL (ref 5–40)

## 2018-08-04 ENCOUNTER — Telehealth: Payer: Self-pay | Admitting: Family Medicine

## 2018-08-04 ENCOUNTER — Other Ambulatory Visit: Payer: Self-pay | Admitting: Family Medicine

## 2018-08-04 NOTE — Telephone Encounter (Signed)
Call [name] back in an hour she [place]

## 2018-08-08 ENCOUNTER — Other Ambulatory Visit: Payer: Self-pay | Admitting: Family Medicine

## 2018-08-08 NOTE — Telephone Encounter (Signed)
[name] called stating patient can not take Seroquel due to it making him accidents on his self. States she would like patient to be put back on Risperidal- if approved please send in new rx to [facility].

## 2018-08-24 ENCOUNTER — Other Ambulatory Visit: Payer: Self-pay

## 2018-08-24 ENCOUNTER — Ambulatory Visit (INDEPENDENT_AMBULATORY_CARE_PROVIDER_SITE_OTHER): Payer: Medicaid Other

## 2018-08-24 DIAGNOSIS — F329 Major depressive disorder, single episode, unspecified: Secondary | ICD-10-CM | POA: Diagnosis not present

## 2018-08-24 DIAGNOSIS — R32 Unspecified urinary incontinence: Secondary | ICD-10-CM

## 2018-08-24 DIAGNOSIS — I69061 Other paralytic syndrome following nontraumatic subarachnoid hemorrhage affecting right dominant side: Secondary | ICD-10-CM | POA: Diagnosis not present

## 2018-09-07 ENCOUNTER — Other Ambulatory Visit: Payer: Self-pay | Admitting: Family Medicine

## 2018-10-28 ENCOUNTER — Other Ambulatory Visit: Payer: Self-pay | Admitting: Family Medicine

## 2018-11-02 ENCOUNTER — Other Ambulatory Visit: Payer: Self-pay

## 2018-11-02 ENCOUNTER — Ambulatory Visit (INDEPENDENT_AMBULATORY_CARE_PROVIDER_SITE_OTHER): Payer: Medicaid Other | Admitting: Family Medicine

## 2018-11-02 ENCOUNTER — Encounter: Payer: Self-pay | Admitting: Family Medicine

## 2018-11-02 DIAGNOSIS — I10 Essential (primary) hypertension: Secondary | ICD-10-CM

## 2018-11-02 DIAGNOSIS — J439 Emphysema, unspecified: Secondary | ICD-10-CM

## 2018-11-02 DIAGNOSIS — E782 Mixed hyperlipidemia: Secondary | ICD-10-CM

## 2018-11-02 DIAGNOSIS — F339 Major depressive disorder, recurrent, unspecified: Secondary | ICD-10-CM

## 2018-11-02 DIAGNOSIS — F411 Generalized anxiety disorder: Secondary | ICD-10-CM

## 2018-11-02 MED ORDER — METOPROLOL SUCCINATE ER 25 MG PO TB24: 25.0000 mg | ORAL_TABLET | Freq: Every day | ORAL | 3 refills | Status: DC

## 2018-11-02 MED ORDER — IPRATROPIUM-ALBUTEROL 0.5-2.5 (3) MG/3ML IN SOLN: 3.0000 mL | RESPIRATORY_TRACT | 3 refills | Status: DC | PRN

## 2018-11-02 MED ORDER — SIMVASTATIN 20 MG PO TABS: 20.0000 mg | ORAL_TABLET | Freq: Every day | ORAL | 3 refills | Status: DC

## 2018-11-02 MED ORDER — NIACIN ER (ANTIHYPERLIPIDEMIC) 1000 MG PO TBCR: 1000.0000 mg | EXTENDED_RELEASE_TABLET | Freq: Every day | ORAL | 5 refills | Status: DC

## 2018-11-02 MED ORDER — RISPERIDONE 2 MG PO TABS: 2.0000 mg | ORAL_TABLET | Freq: Every day | ORAL | 3 refills | Status: DC

## 2018-11-02 MED ORDER — ALBUTEROL SULFATE HFA 108 (90 BASE) MCG/ACT IN AERS: INHALATION_SPRAY | RESPIRATORY_TRACT | 4 refills | Status: DC

## 2018-11-02 MED ORDER — ALPRAZOLAM 1 MG PO TABS: 1.0000 mg | ORAL_TABLET | Freq: Three times a day (TID) | ORAL | 2 refills | Status: DC | PRN

## 2018-11-02 NOTE — Progress Notes (Signed)
Virtual Visit via telephone Note  I connected with [name] on 11/02/18 at 1538 by telephone and verified that I am speaking with the correct person using two identifiers. [name] is currently located at home and sister who is his guardian are currently with her during visit. The provider, Elige RadonJoshua A , MD is located in their office at time of visit.  Phone history provided by sister because patient has memory and cognitive issues  Call ended at 1552  I discussed the limitations, risks, security and privacy concerns of performing an evaluation and management service by telephone and the availability of in person appointments. I also discussed with the patient that there may be a patient responsible charge related to this service. The patient expressed understanding and agreed to proceed.   History and Present Illness: COPD Patient is coming in for COPD recheck today.  He is currently on albuterol and ipatroprium.  He has a mild chronic cough but denies any major coughing spells or wheezing spells.  He has 0nighttime symptoms per week and 1daytime symptoms per week currently.  Hyperlipidemia Patient is coming in for recheck of his hyperlipidemia. The patient is currently taking niacin and simvastatin. They deny any issues with myalgias or history of liver damage from it. They deny any focal numbness or weakness or chest pain.   Hypertension Patient is currently on metoprolol, and their blood pressure today is unknown but home care nurse said it was good. Patient denies any lightheadedness or dizziness. Patient denies headaches, blurred vision, chest pains, shortness of breath, or weakness. Denies any side effects from medication and is content with current medication.   No diagnosis found.  Outpatient Encounter Medications as of 11/02/2018  Medication Sig  . albuterol (PROAIR HFA) 108 (90 Base) MCG/ACT inhaler 2 PUFFS EVERY 6 HOURS AS NEEDED FOR WHEEZING OR  SHORTNESS OF BREATH  . ALPRAZolam (XANAX) 1 MG tablet Take 1 tablet (1 mg total) by mouth 3 (three) times daily as needed for anxiety.  Marland Kitchen. aspirin 81 MG chewable tablet Chew 81 mg by mouth daily.  Marland Kitchen. esomeprazole (NEXIUM) 40 MG capsule TAKE (1) CAPSULE DAILY  . fluticasone (FLONASE) 50 MCG/ACT nasal spray Place 1 spray into both nostrils daily.  Marland Kitchen. ipratropium-albuterol (DUONEB) 0.5-2.5 (3) MG/3ML SOLN Take 3 mLs by nebulization 4 times daily.  . metoprolol succinate (TOPROL-XL) 25 MG 24 hr tablet Take 1 tablet (25 mg total) by mouth daily.  . mupirocin ointment (BACTROBAN) 2 % PLACE 1 APPLICATION INTO THE NOSE 2 TIMES A DAY  . NIASPAN 1000 MG CR tablet Take 1 tablet (1,000 mg total) by mouth at bedtime.  . pramipexole (MIRAPEX) 0.5 MG tablet TAKE ONE TABLET AT BEDTIME  . QUEtiapine Fumarate (SEROQUEL XR) 150 MG 24 hr tablet Take 1 tablet (150 mg total) by mouth at bedtime.  . risperiDONE (RISPERDAL) 2 MG tablet Take 1 tablet (2 mg total) by mouth at bedtime.  . simvastatin (ZOCOR) 20 MG tablet TAKE ONE TABLET AT BEDTIME  . solifenacin (VESICARE) 5 MG tablet TAKE (2) TABLETS DAILY   No facility-administered encounter medications on file as of 11/02/2018.     Review of Systems  Constitutional: Negative for chills and fever.  Respiratory: Negative for shortness of breath and wheezing.   Cardiovascular: Negative for chest pain and leg swelling.  Musculoskeletal: Negative for back pain and gait problem.  Skin: Negative for rash.  Neurological: Negative for dizziness, weakness and light-headedness.  Psychiatric/Behavioral: Positive for confusion and dysphoric  mood. Negative for self-injury, sleep disturbance and suicidal ideas. The patient is nervous/anxious.   All other systems reviewed and are negative.   Observations/Objective: Patient sister provides a history because patient has confusion and memory issues  Assessment and Plan: Problem List Items Addressed This Visit       Cardiovascular and Mediastinum   Essential hypertension   Relevant Medications   simvastatin (ZOCOR) 20 MG tablet   metoprolol succinate (TOPROL-XL) 25 MG 24 hr tablet   niacin (NIASPAN) 1000 MG CR tablet     Respiratory   COPD (chronic obstructive pulmonary disease) (HCC)   Relevant Medications   ipratropium-albuterol (DUONEB) 0.5-2.5 (3) MG/3ML SOLN   albuterol (PROAIR HFA) 108 (90 Base) MCG/ACT inhaler     Other   Hyperlipidemia - Primary   Relevant Medications   simvastatin (ZOCOR) 20 MG tablet   metoprolol succinate (TOPROL-XL) 25 MG 24 hr tablet   niacin (NIASPAN) 1000 MG CR tablet   Generalized anxiety disorder   Relevant Medications   ALPRAZolam (XANAX) 1 MG tablet   risperiDONE (RISPERDAL) 2 MG tablet   Depression, recurrent (HCC)   Relevant Medications   ALPRAZolam (XANAX) 1 MG tablet       Follow Up Instructions: Follow-up in 3 months, appointment was made for August 20    I discussed the assessment and treatment plan with the patient. The patient was provided an opportunity to ask questions and all were answered. The patient agreed with the plan and demonstrated an understanding of the instructions.   The patient was advised to call back or seek an in-person evaluation if the symptoms worsen or if the condition fails to improve as anticipated.  The above assessment and management plan was discussed with the patient. The patient verbalized understanding of and has agreed to the management plan. Patient is aware to call the clinic if symptoms persist or worsen. Patient is aware when to return to the clinic for a follow-up visit. Patient educated on when it is appropriate to go to the emergency department.    I provided 14 minutes of non-face-to-face time during this encounter.    Nils Pyle, MD

## 2018-11-16 ENCOUNTER — Ambulatory Visit (INDEPENDENT_AMBULATORY_CARE_PROVIDER_SITE_OTHER): Payer: Medicaid Other

## 2018-11-16 ENCOUNTER — Other Ambulatory Visit: Payer: Self-pay

## 2018-11-16 DIAGNOSIS — R32 Unspecified urinary incontinence: Secondary | ICD-10-CM

## 2018-11-16 DIAGNOSIS — I69061 Other paralytic syndrome following nontraumatic subarachnoid hemorrhage affecting right dominant side: Secondary | ICD-10-CM

## 2018-11-16 DIAGNOSIS — F329 Major depressive disorder, single episode, unspecified: Secondary | ICD-10-CM

## 2019-01-28 ENCOUNTER — Other Ambulatory Visit: Payer: Self-pay | Admitting: Family Medicine

## 2019-01-28 DIAGNOSIS — F411 Generalized anxiety disorder: Secondary | ICD-10-CM

## 2019-01-28 DIAGNOSIS — F339 Major depressive disorder, recurrent, unspecified: Secondary | ICD-10-CM

## 2019-01-31 ENCOUNTER — Other Ambulatory Visit: Payer: Self-pay

## 2019-01-31 ENCOUNTER — Ambulatory Visit (INDEPENDENT_AMBULATORY_CARE_PROVIDER_SITE_OTHER): Payer: Medicare Other

## 2019-01-31 DIAGNOSIS — F329 Major depressive disorder, single episode, unspecified: Secondary | ICD-10-CM

## 2019-01-31 DIAGNOSIS — R32 Unspecified urinary incontinence: Secondary | ICD-10-CM | POA: Diagnosis not present

## 2019-01-31 DIAGNOSIS — I69061 Other paralytic syndrome following nontraumatic subarachnoid hemorrhage affecting right dominant side: Secondary | ICD-10-CM | POA: Diagnosis not present

## 2019-02-01 ENCOUNTER — Telehealth: Payer: Self-pay | Admitting: *Deleted

## 2019-02-01 ENCOUNTER — Other Ambulatory Visit: Payer: Self-pay

## 2019-02-01 NOTE — Telephone Encounter (Signed)
Esomeprazole Magnesium cap is not on insurance formulary.  Formulary Alternatives are omeprazole cap, lansoprazole cap, pantoprazole tab and dexilant cap(dexilant is brand name and may not be covered without trying and failing the others)

## 2019-02-01 NOTE — Telephone Encounter (Signed)
I don't have records but I am sure he has tried the others in the past and we can ask him

## 2019-02-02 ENCOUNTER — Ambulatory Visit (INDEPENDENT_AMBULATORY_CARE_PROVIDER_SITE_OTHER): Payer: Medicare Other | Admitting: Family Medicine

## 2019-02-02 ENCOUNTER — Encounter: Payer: Self-pay | Admitting: Family Medicine

## 2019-02-02 VITALS — BP 115/64 | HR 73 | Temp 97.5°F | Ht 67.5 in | Wt 141.0 lb

## 2019-02-02 DIAGNOSIS — E782 Mixed hyperlipidemia: Secondary | ICD-10-CM | POA: Diagnosis not present

## 2019-02-02 DIAGNOSIS — Z79899 Other long term (current) drug therapy: Secondary | ICD-10-CM

## 2019-02-02 DIAGNOSIS — F339 Major depressive disorder, recurrent, unspecified: Secondary | ICD-10-CM | POA: Diagnosis not present

## 2019-02-02 DIAGNOSIS — I10 Essential (primary) hypertension: Secondary | ICD-10-CM

## 2019-02-02 DIAGNOSIS — F411 Generalized anxiety disorder: Secondary | ICD-10-CM

## 2019-02-02 DIAGNOSIS — J438 Other emphysema: Secondary | ICD-10-CM

## 2019-02-02 MED ORDER — ALPRAZOLAM 1 MG PO TABS: 1.0000 mg | ORAL_TABLET | Freq: Three times a day (TID) | ORAL | 2 refills | Status: DC | PRN

## 2019-02-02 NOTE — Progress Notes (Signed)
BP 115/64   Pulse 73   Temp (!) 97.5 F (36.4 C) (Temporal)   Ht 5' 7.5" (1.715 m)   Wt 141 lb (64 kg)   BMI 21.76 kg/m    Subjective:   Patient ID: [name], male    DOB: Feb 14, 1956, 63 y.o.   MRN: [id number]  HPI: [name] is a 63 y.o. male presenting on 02/02/2019 for Hypertension (check up of chronic medical conditions), Hyperlipidemia, and legs locking up (Patient states that his legs have been locking up.)   HPI Patient is coming in today for anxiety depression recheck, he is currently taking Xanax 1 mg 3 times daily as needed and says that that works for his anxiety.  He says is the only thing that works for his anxiety.  He denies any suicidal ideations or thoughts of hurt himself.  He does have a lot of memory issues and is brought today by his daughter who is his caretaker who helps manage him.  He has been doing well on this medication as she says.  Hypertension Patient is currently on metoprolol, and their blood pressure today is 115/64. Patient denies any lightheadedness or dizziness. Patient denies headaches, blurred vision, chest pains, shortness of breath, or weakness. Denies any side effects from medication and is content with current medication.   Hyperlipidemia Patient is coming in for recheck of his hyperlipidemia. The patient is currently taking niacin and simvastatin. They deny any issues with myalgias or history of liver damage from it. They deny any focal numbness or weakness or chest pain.   COPD Patient is coming in for COPD recheck today.  He is currently on albuterol and duo nebs.  He has a mild chronic cough but denies any major coughing spells or wheezing spells.  He has 0nighttime symptoms per week and 2daytime symptoms per week currently.   Relevant past medical, surgical, family and social history reviewed and updated as indicated. Interim medical history since our last visit reviewed. Allergies and medications reviewed and  updated.  Review of Systems  Constitutional: Negative for chills and fever.  Respiratory: Positive for wheezing. Negative for shortness of breath.   Cardiovascular: Negative for chest pain and leg swelling.  Musculoskeletal: Negative for back pain and gait problem.  Skin: Negative for rash.  Neurological: Negative for dizziness, weakness and numbness.  Psychiatric/Behavioral: Positive for dysphoric mood. Negative for self-injury, sleep disturbance and suicidal ideas. The patient is nervous/anxious.   All other systems reviewed and are negative.   Per HPI unless specifically indicated above   Allergies as of 02/02/2019      Reactions   Clonidine    REACTION: rash      Medication List       Accurate as of February 02, 2019  2:41 PM. If you have any questions, ask your nurse or doctor.        albuterol 108 (90 Base) MCG/ACT inhaler Commonly known as: ProAir HFA 2 PUFFS EVERY 6 HOURS AS NEEDED FOR WHEEZING OR SHORTNESS OF BREATH   ALPRAZolam 1 MG tablet Commonly known as: XANAX Take 1 tablet (1 mg total) by mouth 3 (three) times daily as needed for anxiety.   aspirin 81 MG chewable tablet Chew 81 mg by mouth daily.   esomeprazole 40 MG capsule Commonly known as: NEXIUM TAKE (1) CAPSULE DAILY   fluticasone 50 MCG/ACT nasal spray Commonly known as: FLONASE Place 1 spray into both nostrils daily.   ipratropium-albuterol 0.5-2.5 (3) MG/3ML Soln Commonly  known as: DUONEB Take 3 mLs by nebulization every 4 (four) hours as needed.   metoprolol succinate 25 MG 24 hr tablet Commonly known as: TOPROL-XL Take 1 tablet (25 mg total) by mouth daily.   mupirocin ointment 2 % Commonly known as: BACTROBAN PLACE 1 APPLICATION INTO THE NOSE 2 TIMES A DAY   niacin 1000 MG CR tablet Commonly known as: Niaspan Take 1 tablet (1,000 mg total) by mouth at bedtime.   pramipexole 0.5 MG tablet Commonly known as: MIRAPEX TAKE ONE TABLET AT BEDTIME   QUEtiapine Fumarate 150 MG 24 hr  tablet Commonly known as: SEROquel XR Take 1 tablet (150 mg total) by mouth at bedtime.   risperiDONE 2 MG tablet Commonly known as: RISPERDAL Take 1 tablet (2 mg total) by mouth at bedtime.   simvastatin 20 MG tablet Commonly known as: ZOCOR Take 1 tablet (20 mg total) by mouth at bedtime.   solifenacin 5 MG tablet Commonly known as: VESIcare TAKE (2) TABLETS DAILY        Objective:   BP 115/64   Pulse 73   Temp (!) 97.5 F (36.4 C) (Temporal)   Ht 5' 7.5" (1.715 m)   Wt 141 lb (64 kg)   BMI 21.76 kg/m   Wt Readings from Last 3 Encounters:  02/02/19 141 lb (64 kg)  08/01/18 137 lb (62.1 kg)  03/14/18 140 lb (63.5 kg)    Physical Exam Vitals signs and nursing note reviewed.  Constitutional:      General: He is not in acute distress.    Appearance: He is well-developed. He is not diaphoretic.  Eyes:     General: No scleral icterus.    Conjunctiva/sclera: Conjunctivae normal.  Neck:     Musculoskeletal: Neck supple.     Thyroid: No thyromegaly.  Cardiovascular:     Rate and Rhythm: Normal rate and regular rhythm.     Heart sounds: Normal heart sounds. No murmur.  Pulmonary:     Effort: Pulmonary effort is normal. No respiratory distress.     Breath sounds: Wheezing (Mild wheezes) present.  Musculoskeletal: Normal range of motion.  Lymphadenopathy:     Cervical: No cervical adenopathy.  Skin:    General: Skin is warm and dry.     Findings: No rash.  Neurological:     Mental Status: He is alert and oriented to person, place, and time.     Coordination: Coordination normal.  Psychiatric:        Behavior: Behavior normal.       Assessment & Plan:   Problem List Items Addressed This Visit      Cardiovascular and Mediastinum   Essential hypertension - Primary   Relevant Orders   CBC with Differential/Platelet (Completed)   CMP14+EGFR (Completed)     Respiratory   COPD (chronic obstructive pulmonary disease) ([place])   Relevant Orders   ToxASSURE  Select 13 (MW), Urine (Completed)     Other   Hyperlipidemia   Relevant Orders   CBC with Differential/Platelet (Completed)   Lipid panel (Completed)   Generalized anxiety disorder   Relevant Medications   ALPRAZolam (XANAX) 1 MG tablet   Other Relevant Orders   CBC with Differential/Platelet (Completed)   Depression, recurrent (HCC)   Relevant Medications   ALPRAZolam (XANAX) 1 MG tablet   Controlled substance agreement signed      Continue Xanax, will do oxygen testing for patient.  Patient's daughter thinks he needs oxygen because he gets a little short of breath  sometimes. Follow up plan: Return in about 3 months (around 05/05/2019), or if symptoms worsen or fail to improve, for Anxiety recheck.  Counseling provided for all of the vaccine components Orders Placed This Encounter  Procedures  . CBC with Differential/Platelet  . CMP14+EGFR  . Lipid panel  . ToxASSURE Select 13 (MW), Urine    Caryl Pina, MD [place] 02/02/2019, 2:41 PM

## 2019-02-03 LAB — CBC WITH DIFFERENTIAL/PLATELET
Basophils Absolute: 0.1 10*3/uL (ref 0.0–0.2)
Basos: 1 %
EOS (ABSOLUTE): 0.1 10*3/uL (ref 0.0–0.4)
Eos: 1 %
Hematocrit: 38.8 % (ref 37.5–51.0)
Hemoglobin: 13.4 g/dL (ref 13.0–17.7)
Immature Grans (Abs): 0 10*3/uL (ref 0.0–0.1)
Immature Granulocytes: 0 %
Lymphocytes Absolute: 2.1 10*3/uL (ref 0.7–3.1)
Lymphs: 27 %
MCH: 33.1 pg — ABNORMAL HIGH (ref 26.6–33.0)
MCHC: 34.5 g/dL (ref 31.5–35.7)
MCV: 96 fL (ref 79–97)
Monocytes Absolute: 0.8 10*3/uL (ref 0.1–0.9)
Monocytes: 10 %
Neutrophils Absolute: 4.7 10*3/uL (ref 1.4–7.0)
Neutrophils: 61 %
Platelets: 203 10*3/uL (ref 150–450)
RBC: 4.05 x10E6/uL — ABNORMAL LOW (ref 4.14–5.80)
RDW: 12.7 % (ref 11.6–15.4)
WBC: 7.7 10*3/uL (ref 3.4–10.8)

## 2019-02-03 LAB — LIPID PANEL
Chol/HDL Ratio: 2 ratio (ref 0.0–5.0)
Cholesterol, Total: 117 mg/dL (ref 100–199)
HDL: 58 mg/dL (ref >39)
LDL Calculated: 45 mg/dL (ref 0–99)
Triglycerides: 72 mg/dL (ref 0–149)
VLDL Cholesterol Cal: 14 mg/dL (ref 5–40)

## 2019-02-03 LAB — CMP14+EGFR
ALT: 14 IU/L (ref 0–44)
AST: 18 IU/L (ref 0–40)
Albumin/Globulin Ratio: 1.8 (ref 1.2–2.2)
Albumin: 4.5 g/dL (ref 3.8–4.8)
Alkaline Phosphatase: 70 IU/L (ref 39–117)
BUN/Creatinine Ratio: 15 (ref 10–24)
BUN: 12 mg/dL (ref 8–27)
Bilirubin Total: 0.3 mg/dL (ref 0.0–1.2)
CO2: 24 mmol/L (ref 20–29)
Calcium: 9.7 mg/dL (ref 8.6–10.2)
Chloride: 101 mmol/L (ref 96–106)
Creatinine, Ser: 0.78 mg/dL (ref 0.76–1.27)
GFR calc Af Amer: 110 mL/min/{1.73_m2} (ref >59)
GFR calc non Af Amer: 95 mL/min/{1.73_m2} (ref >59)
Globulin, Total: 2.5 g/dL (ref 1.5–4.5)
Glucose: 92 mg/dL (ref 65–99)
Potassium: 4.6 mmol/L (ref 3.5–5.2)
Sodium: 140 mmol/L (ref 134–144)
Total Protein: 7 g/dL (ref 6.0–8.5)

## 2019-02-05 LAB — TOXASSURE SELECT 13 (MW), URINE

## 2019-02-13 NOTE — Telephone Encounter (Addendum)
Prior Auth for Nexium 40mg  cap-APPROVED  This approval authorizes your coverage from 01/14/2019 - 02/13/2020  Key: [id number]   PA Case ID: [id number]    Your information has been submitted to [place] [employer] Part D. [employer] will review the request and will issue a decision, typically within 1-3 days from your submission. You can check the updated outcome later by reopening this request.  If [employer] has not responded in 1-3 days or if you have any questions about your ePA request, please contact [place] Medicare Part D at [phone]. If you think there may be a problem with your PA request, use our live chat feature at the bottom right.

## 2019-04-05 ENCOUNTER — Telehealth: Payer: Self-pay | Admitting: *Deleted

## 2019-04-05 MED ORDER — MIRABEGRON ER 25 MG PO TB24: 25.0000 mg | ORAL_TABLET | Freq: Every day | ORAL | 11 refills | Status: DC

## 2019-04-05 NOTE — Telephone Encounter (Signed)
I sent Myrbetriq as an alternative because the Vesicare was not covered, I thought we already tried it but we can go ahead and try it again.

## 2019-04-05 NOTE — Telephone Encounter (Signed)
[name] is aware she is going to call his pharmacy and see what is going on.

## 2019-04-05 NOTE — Telephone Encounter (Signed)
Vesicare 5mg -Non Formulary on pt insurance  In order for Vesicare (Solifenacin) to be covered, you are required to have tried and failed all alternatives or confirm that you believe the alternatives would have serious adverse side effects and/or be ineffective for the patient. Myrbetriq, Oxybutynin Chloride ER, Toviaz and Tolterodine Tartrate ER are covered  I don't see any of these in his med history. Please advise

## 2019-04-07 ENCOUNTER — Telehealth: Payer: Self-pay | Admitting: Family Medicine

## 2019-04-07 NOTE — Telephone Encounter (Signed)
She said someone is sending for his records regarding his legs.  She is aware - we will take care of this once we receive them.

## 2019-05-01 ENCOUNTER — Other Ambulatory Visit: Payer: Self-pay | Admitting: Family Medicine

## 2019-05-01 DIAGNOSIS — F339 Major depressive disorder, recurrent, unspecified: Secondary | ICD-10-CM

## 2019-05-01 DIAGNOSIS — F411 Generalized anxiety disorder: Secondary | ICD-10-CM

## 2019-05-02 ENCOUNTER — Telehealth: Payer: Self-pay | Admitting: Family Medicine

## 2019-05-02 NOTE — Telephone Encounter (Signed)
What is the name of the medication? Xanax   Have you contacted your pharmacy to request a refill? yes  Which pharmacy would you like this sent to? [facility]. Pt has a televisit scheduled on 05/08/19. Pt will run out of med before his appt.   Patient notified that their request is being sent to the clinical staff for review and that they should receive a call once it is complete. If they do not receive a call within 24 hours they can check with their pharmacy or our office.

## 2019-05-02 NOTE — Telephone Encounter (Signed)
Patient was seen 02-02-19 with provider.  He was given a three month xanax script.   Will he be required to be seen again or given refill for next three months. (protocol says can refill for six months?)

## 2019-05-03 NOTE — Telephone Encounter (Signed)
Unfortunately this have to be filled in the visit, I guess the protocol says it can be filled up to 6 months per physician discretion, and my last appointment we sent 3 months and I gave him 3 months and I look forward to seeing him on Monday.  He was given 3 months on the 20th which should last him through the 19th so getting a refill on the 20th should be the appropriate day.  These regulations are coming from [place] that they need to be filled in a visit.

## 2019-05-05 ENCOUNTER — Telehealth: Payer: Self-pay | Admitting: Family Medicine

## 2019-05-05 NOTE — Telephone Encounter (Signed)
This can only be filled in a visit, it is office policy, it is in the controlled substance contract

## 2019-05-05 NOTE — Telephone Encounter (Signed)
States that patient is out of medication. Has apt Monday. Please advise

## 2019-05-05 NOTE — Telephone Encounter (Signed)
Azzie Locklear called requesting that pt's Xanax Rx be refilled. Wants to be called back asap.

## 2019-05-05 NOTE — Telephone Encounter (Signed)
She is aware 

## 2019-05-08 ENCOUNTER — Ambulatory Visit (INDEPENDENT_AMBULATORY_CARE_PROVIDER_SITE_OTHER): Payer: Medicare Other | Admitting: Family Medicine

## 2019-05-08 ENCOUNTER — Encounter: Payer: Self-pay | Admitting: Family Medicine

## 2019-05-08 DIAGNOSIS — F411 Generalized anxiety disorder: Secondary | ICD-10-CM | POA: Diagnosis not present

## 2019-05-08 DIAGNOSIS — F339 Major depressive disorder, recurrent, unspecified: Secondary | ICD-10-CM

## 2019-05-08 MED ORDER — ALPRAZOLAM 1 MG PO TABS: 1.0000 mg | ORAL_TABLET | Freq: Three times a day (TID) | ORAL | 2 refills | Status: DC | PRN

## 2019-05-08 MED ORDER — RISPERIDONE 3 MG PO TABS: 3.0000 mg | ORAL_TABLET | Freq: Every day | ORAL | 3 refills | Status: DC

## 2019-05-08 MED ORDER — AEROCHAMBER PLUS FLO-VU MEDIUM MISC: 1.0000 | Freq: Once | 0 refills | Status: AC

## 2019-05-08 NOTE — Progress Notes (Signed)
Virtual Visit via telephone Note  I connected with [name] on 05/08/19 at 1324 by telephone and verified that I am speaking with the correct person using two identifiers. [name] is currently located at home and no other people are currently with her during visit. The provider, Elige Radon Elif Yonts, MD is located in their office at time of visit.  Call ended at 1334  I discussed the limitations, risks, security and privacy concerns of performing an evaluation and management service by telephone and the availability of in person appointments. I also discussed with the patient that there may be a patient responsible charge related to this service. The patient expressed understanding and agreed to proceed.   History and Present Illness: Anxiety Patient is still having a lot of anxiety and edginess. He is sleeping some but not well and has a lot of anxiety during the day since he ran out of the xanax Current rx- xanax 1mg  tid prn # meds rx- 90 Effectiveness of current meds-working well Adverse reactions form meds-none  Pill count performed-No Last drug screen - 02/03/2019 ( high risk q34m, moderate risk q35m, low risk yearly ) Urine drug screen today- No Was the NCCSR reviewed- yes  If yes were their any concerning findings? - none  No flowsheet data found.   Controlled substance contract signed on: 02/03/2019   No diagnosis found.  Outpatient Encounter Medications as of 05/08/2019  Medication Sig  . albuterol (PROAIR HFA) 108 (90 Base) MCG/ACT inhaler 2 PUFFS EVERY 6 HOURS AS NEEDED FOR WHEEZING OR SHORTNESS OF BREATH  . ALPRAZolam (XANAX) 1 MG tablet Take 1 tablet (1 mg total) by mouth 3 (three) times daily as needed for anxiety.  05/10/2019 aspirin 81 MG chewable tablet Chew 81 mg by mouth daily.  Marland Kitchen esomeprazole (NEXIUM) 40 MG capsule TAKE (1) CAPSULE DAILY  . fluticasone (FLONASE) 50 MCG/ACT nasal spray Place 1 spray into both nostrils daily.  Marland Kitchen  ipratropium-albuterol (DUONEB) 0.5-2.5 (3) MG/3ML SOLN Take 3 mLs by nebulization every 4 (four) hours as needed.  . metoprolol succinate (TOPROL-XL) 25 MG 24 hr tablet Take 1 tablet (25 mg total) by mouth daily.  . mirabegron ER (MYRBETRIQ) 25 MG TB24 tablet Take 1 tablet (25 mg total) by mouth daily.  . mupirocin ointment (BACTROBAN) 2 % PLACE 1 APPLICATION INTO THE NOSE 2 TIMES A DAY  . niacin (NIASPAN) 1000 MG CR tablet Take 1 tablet (1,000 mg total) by mouth at bedtime.  . pramipexole (MIRAPEX) 0.5 MG tablet TAKE ONE TABLET AT BEDTIME  . QUEtiapine Fumarate (SEROQUEL XR) 150 MG 24 hr tablet Take 1 tablet (150 mg total) by mouth at bedtime.  . risperiDONE (RISPERDAL) 2 MG tablet Take 1 tablet (2 mg total) by mouth at bedtime.  . simvastatin (ZOCOR) 20 MG tablet Take 1 tablet (20 mg total) by mouth at bedtime.  . solifenacin (VESICARE) 5 MG tablet TAKE (2) TABLETS DAILY   No facility-administered encounter medications on file as of 05/08/2019.     Review of Systems  Constitutional: Negative for chills and fever.  Respiratory: Negative for shortness of breath and wheezing.   Cardiovascular: Negative for chest pain and leg swelling.  Musculoskeletal: Negative for back pain and gait problem.  Skin: Negative for rash.  Neurological: Negative for dizziness, weakness and light-headedness.  Psychiatric/Behavioral: Positive for dysphoric mood and sleep disturbance. Negative for self-injury and suicidal ideas. The patient is nervous/anxious.   All other systems reviewed and are negative.  Observations/Objective: Patient sounds comfortable and in no acute distress  Assessment and Plan: Problem List Items Addressed This Visit      Other   Generalized anxiety disorder - Primary   Relevant Medications   ALPRAZolam (XANAX) 1 MG tablet   risperiDONE (RISPERDAL) 3 MG tablet   Depression, recurrent (HCC)   Relevant Medications   ALPRAZolam (XANAX) 1 MG tablet   risperiDONE (RISPERDAL) 3 MG  tablet      Will increase the Risperdal from 2 mg to 3 mg and continue with alprazolam for now at the current dose. Follow Up Instructions:  Follow up in 3 months for anxiety and depression   I discussed the assessment and treatment plan with the patient. The patient was provided an opportunity to ask questions and all were answered. The patient agreed with the plan and demonstrated an understanding of the instructions.   The patient was advised to call back or seek an in-person evaluation if the symptoms worsen or if the condition fails to improve as anticipated.  The above assessment and management plan was discussed with the patient. The patient verbalized understanding of and has agreed to the management plan. Patient is aware to call the clinic if symptoms persist or worsen. Patient is aware when to return to the clinic for a follow-up visit. Patient educated on when it is appropriate to go to the emergency department.    I provided 10 minutes of non-face-to-face time during this encounter.    Worthy Rancher, MD

## 2019-05-18 ENCOUNTER — Other Ambulatory Visit: Payer: Self-pay

## 2019-05-18 ENCOUNTER — Ambulatory Visit (INDEPENDENT_AMBULATORY_CARE_PROVIDER_SITE_OTHER): Payer: Medicare Other

## 2019-05-18 DIAGNOSIS — F329 Major depressive disorder, single episode, unspecified: Secondary | ICD-10-CM | POA: Diagnosis not present

## 2019-05-18 DIAGNOSIS — Z8673 Personal history of transient ischemic attack (TIA), and cerebral infarction without residual deficits: Secondary | ICD-10-CM

## 2019-05-18 DIAGNOSIS — R32 Unspecified urinary incontinence: Secondary | ICD-10-CM

## 2019-05-18 DIAGNOSIS — Z7982 Long term (current) use of aspirin: Secondary | ICD-10-CM

## 2019-05-23 DIAGNOSIS — Z029 Encounter for administrative examinations, unspecified: Secondary | ICD-10-CM

## 2019-05-24 ENCOUNTER — Other Ambulatory Visit: Payer: Self-pay

## 2019-05-25 ENCOUNTER — Encounter: Payer: Self-pay | Admitting: Family Medicine

## 2019-05-25 ENCOUNTER — Ambulatory Visit (INDEPENDENT_AMBULATORY_CARE_PROVIDER_SITE_OTHER): Payer: Medicare Other | Admitting: Family Medicine

## 2019-05-25 VITALS — BP 140/73 | HR 98 | Temp 99.1°F | Ht 67.5 in | Wt 143.2 lb

## 2019-05-25 DIAGNOSIS — L02611 Cutaneous abscess of right foot: Secondary | ICD-10-CM

## 2019-05-25 DIAGNOSIS — L03031 Cellulitis of right toe: Secondary | ICD-10-CM

## 2019-05-25 MED ORDER — SPACER/AERO-HOLDING CHAMBERS DEVI: 1.0000 | 1 refills | Status: DC | PRN

## 2019-05-25 MED ORDER — SULFAMETHOXAZOLE-TRIMETHOPRIM 800-160 MG PO TABS: 1.0000 | ORAL_TABLET | Freq: Two times a day (BID) | ORAL | 0 refills | Status: DC

## 2019-05-25 NOTE — Addendum Note (Signed)
Addended by: Nigel Berthold C on: 05/25/2019 03:16 PM   Modules accepted: Orders

## 2019-05-25 NOTE — Progress Notes (Signed)
BP 140/73   Pulse 98   Temp 99.1 F (37.3 C) (Temporal)   Ht 5' 7.5" (1.715 m)   Wt 143 lb 3.2 oz (65 kg)   SpO2 99%   BMI 22.10 kg/m    Subjective:   Patient ID: [name], male    DOB: 1955/07/02, 63 y.o.   MRN: [id number]  HPI: [name] is a 63 y.o. male presenting on 05/25/2019 for infected toe? (Right foot second toe. x 2 days)   HPI Patient is coming in with infected toe on his right foot, he started with a sore between the toes of his right first and second toes on that right foot, she says it was inflamed on the inside of the first toe and she using antibiotic cream and that calmed down but now is very inflamed and sore on the top of his right second toe, when he walks his first and second toe do overlap and she usually tries to keep them wedged apart but has not recently and they rub together and that is what is causing the issues.  Patient denies any fevers or chills.  They have had some purulent drainage come out of it when she compresses it the other day.  Relevant past medical, surgical, family and social history reviewed and updated as indicated. Interim medical history since our last visit reviewed. Allergies and medications reviewed and updated.  Review of Systems  Skin: Positive for color change, rash and wound.    Per HPI unless specifically indicated above   Allergies as of 05/25/2019      Reactions   Clonidine    REACTION: rash      Medication List       Accurate as of May 25, 2019  2:50 PM. If you have any questions, ask your nurse or doctor.        albuterol 108 (90 Base) MCG/ACT inhaler Commonly known as: ProAir HFA 2 PUFFS EVERY 6 HOURS AS NEEDED FOR WHEEZING OR SHORTNESS OF BREATH   ALPRAZolam 1 MG tablet Commonly known as: XANAX Take 1 tablet (1 mg total) by mouth 3 (three) times daily as needed for anxiety.   aspirin 81 MG chewable tablet Chew 81 mg by mouth daily.   esomeprazole 40 MG capsule Commonly  known as: NEXIUM TAKE (1) CAPSULE DAILY   fluticasone 50 MCG/ACT nasal spray Commonly known as: FLONASE Place 1 spray into both nostrils daily.   ipratropium-albuterol 0.5-2.5 (3) MG/3ML Soln Commonly known as: DUONEB Take 3 mLs by nebulization every 4 (four) hours as needed.   metoprolol succinate 25 MG 24 hr tablet Commonly known as: TOPROL-XL Take 1 tablet (25 mg total) by mouth daily.   mirabegron ER 25 MG Tb24 tablet Commonly known as: Myrbetriq Take 1 tablet (25 mg total) by mouth daily.   mupirocin ointment 2 % Commonly known as: BACTROBAN PLACE 1 APPLICATION INTO THE NOSE 2 TIMES A DAY   niacin 1000 MG CR tablet Commonly known as: Niaspan Take 1 tablet (1,000 mg total) by mouth at bedtime.   pramipexole 0.5 MG tablet Commonly known as: MIRAPEX TAKE ONE TABLET AT BEDTIME   risperiDONE 3 MG tablet Commonly known as: RisperDAL Take 1 tablet (3 mg total) by mouth at bedtime.   simvastatin 20 MG tablet Commonly known as: ZOCOR Take 1 tablet (20 mg total) by mouth at bedtime.   solifenacin 5 MG tablet Commonly known as: VESIcare TAKE (2) TABLETS DAILY  Objective:   BP 140/73   Pulse 98   Temp 99.1 F (37.3 C) (Temporal)   Ht 5' 7.5" (1.715 m)   Wt 143 lb 3.2 oz (65 kg)   SpO2 99%   BMI 22.10 kg/m   Wt Readings from Last 3 Encounters:  05/25/19 143 lb 3.2 oz (65 kg)  02/02/19 141 lb (64 kg)  08/01/18 137 lb (62.1 kg)    Physical Exam Vitals and nursing note reviewed.  Constitutional:      General: He is not in acute distress.    Appearance: He is well-developed. He is not diaphoretic.  Eyes:     General: No scleral icterus.    Conjunctiva/sclera: Conjunctivae normal.  Neck:     Thyroid: No thyromegaly.  Skin:    General: Skin is warm and dry.     Findings: Erythema present. No rash.  Neurological:     Mental Status: He is alert and oriented to person, place, and time.     Coordination: Coordination normal.  Psychiatric:         Behavior: Behavior normal.       Assessment & Plan:   Problem List Items Addressed This Visit    None    Visit Diagnoses    Cellulitis and abscess of toe of right foot    -  Primary   Relevant Medications   sulfamethoxazole-trimethoprim (BACTRIM DS) 800-160 MG tablet       Follow up plan: Return in about 1 week (around 06/01/2019), or if symptoms worsen or fail to improve, for Recheck cellulitis and abscess.  Counseling provided for all of the vaccine components No orders of the defined types were placed in this encounter.   Caryl Pina, MD [facility] 05/25/2019, 2:50 PM

## 2019-05-31 ENCOUNTER — Other Ambulatory Visit: Payer: Self-pay | Admitting: Family Medicine

## 2019-05-31 LAB — ANAEROBIC AND AEROBIC CULTURE

## 2019-06-12 ENCOUNTER — Encounter: Payer: Self-pay | Admitting: Family Medicine

## 2019-06-12 ENCOUNTER — Other Ambulatory Visit: Payer: Self-pay

## 2019-06-12 ENCOUNTER — Ambulatory Visit (INDEPENDENT_AMBULATORY_CARE_PROVIDER_SITE_OTHER): Payer: Medicare Other | Admitting: Family Medicine

## 2019-06-12 VITALS — BP 112/69 | HR 77 | Temp 97.1°F | Resp 20 | Ht 67.5 in | Wt 143.0 lb

## 2019-06-12 DIAGNOSIS — L02611 Cutaneous abscess of right foot: Secondary | ICD-10-CM | POA: Diagnosis not present

## 2019-06-12 DIAGNOSIS — L03031 Cellulitis of right toe: Secondary | ICD-10-CM | POA: Diagnosis not present

## 2019-06-12 MED ORDER — MUPIROCIN 2 % EX OINT: TOPICAL_OINTMENT | CUTANEOUS | 5 refills | Status: DC

## 2019-06-12 MED ORDER — AMOXICILLIN-POT CLAVULANATE 875-125 MG PO TABS: 1.0000 | ORAL_TABLET | Freq: Two times a day (BID) | ORAL | 0 refills | Status: AC

## 2019-06-12 NOTE — Patient Instructions (Signed)

## 2019-06-12 NOTE — Progress Notes (Signed)
Subjective:  Patient ID: [name], male    DOB: May 14, 1956, 63 y.o.   MRN: [id number]  Patient Care Team: Dettinger, Fransisca Kaufmann, MD as PCP - General (Family Medicine)   Chief Complaint:  Recurrent Skin Infections (right 2nd toe infection - 2 week follow up)   HPI: [name] is a 63 y.o. male presenting on 06/12/2019 for Recurrent Skin Infections (right 2nd toe infection - 2 week follow up)   Pt following up today for wound recheck or right second toe. Pt was placed on bactrim on 05/25/2019 for cellulitis in this toe. Pt returns today for reevaluation of wound. Pt and family states the wound has improved but has not completely healed. No fever, chills, or weakness.      Relevant past medical, surgical, family, and social history reviewed and updated as indicated.  Allergies and medications reviewed and updated. Date reviewed: Chart in Epic.   Past Medical History:  Diagnosis Date  . Anxiety   . Cerebral aneurysm   . Closed head injury   . COPD (chronic obstructive pulmonary disease) ([place])   . Depression   . GERD (gastroesophageal reflux disease)   . Hyperlipidemia   . Hypertension   . Seizures ([place])   . Sleep apnea     History reviewed. No pertinent surgical history.  Social History   Socioeconomic History  . Marital status: Single    Spouse name: Not on file  . Number of children: Not on file  . Years of education: Not on file  . Highest education level: Not on file  Occupational History  . Not on file  Tobacco Use  . Smoking status: Current Every Day Smoker    Packs/day: 1.00    Years: 50.00    Pack years: 50.00  . Smokeless tobacco: Never Used  Substance and Sexual Activity  . Alcohol use: No  . Drug use: No  . Sexual activity: Not on file  Other Topics Concern  . Not on file  Social History Narrative  . Not on file   Social Determinants of Health   Financial Resource Strain:   . Difficulty of Paying Living Expenses: Not on  file  Food Insecurity:   . Worried About Charity fundraiser in the Last Year: Not on file  . Ran Out of Food in the Last Year: Not on file  Transportation Needs:   . Lack of Transportation (Medical): Not on file  . Lack of Transportation (Non-Medical): Not on file  Physical Activity:   . Days of Exercise per Week: Not on file  . Minutes of Exercise per Session: Not on file  Stress:   . Feeling of Stress : Not on file  Social Connections:   . Frequency of Communication with Friends and Family: Not on file  . Frequency of Social Gatherings with Friends and Family: Not on file  . Attends Religious Services: Not on file  . Active Member of Clubs or Organizations: Not on file  . Attends Archivist Meetings: Not on file  . Marital Status: Not on file  Intimate Partner Violence:   . Fear of Current or Ex-Partner: Not on file  . Emotionally Abused: Not on file  . Physically Abused: Not on file  . Sexually Abused: Not on file    Outpatient Encounter Medications as of 06/12/2019  Medication Sig  . albuterol (PROAIR HFA) 108 (90 Base) MCG/ACT inhaler 2 PUFFS EVERY 6 HOURS AS NEEDED FOR WHEEZING  OR SHORTNESS OF BREATH  . ALPRAZolam (XANAX) 1 MG tablet Take 1 tablet (1 mg total) by mouth 3 (three) times daily as needed for anxiety.  Marland Kitchen aspirin 81 MG chewable tablet Chew 81 mg by mouth daily.  Marland Kitchen esomeprazole (NEXIUM) 40 MG capsule TAKE (1) CAPSULE DAILY  . fluticasone (FLONASE) 50 MCG/ACT nasal spray Place 1 spray into both nostrils daily.  Marland Kitchen ipratropium-albuterol (DUONEB) 0.5-2.5 (3) MG/3ML SOLN Take 3 mLs by nebulization every 4 (four) hours as needed.  . metoprolol succinate (TOPROL-XL) 25 MG 24 hr tablet Take 1 tablet (25 mg total) by mouth daily.  . mirabegron ER (MYRBETRIQ) 25 MG TB24 tablet Take 1 tablet (25 mg total) by mouth daily.  . mupirocin ointment (BACTROBAN) 2 % PLACE 1 APPLICATION INTO THE NOSE 2 TIMES A DAY  . niacin (NIASPAN) 1000 MG CR tablet Take 1 tablet (1,000  mg total) by mouth at bedtime.  . pramipexole (MIRAPEX) 0.5 MG tablet TAKE ONE TABLET AT BEDTIME  . risperiDONE (RISPERDAL) 3 MG tablet Take 1 tablet (3 mg total) by mouth at bedtime.  . simvastatin (ZOCOR) 20 MG tablet Take 1 tablet (20 mg total) by mouth at bedtime.  . solifenacin (VESICARE) 5 MG tablet TAKE (2) TABLETS DAILY  . Spacer/Aero-Holding Chambers DEVI 1 each by Does not apply route as needed.  . [DISCONTINUED] mupirocin ointment (BACTROBAN) 2 % PLACE 1 APPLICATION INTO THE NOSE 2 TIMES A DAY  . amoxicillin-clavulanate (AUGMENTIN) 875-125 MG tablet Take 1 tablet by mouth 2 (two) times daily for 10 days.  . [DISCONTINUED] sulfamethoxazole-trimethoprim (BACTRIM DS) 800-160 MG tablet Take 1 tablet by mouth 2 (two) times daily.   No facility-administered encounter medications on file as of 06/12/2019.    Allergies  Allergen Reactions  . Clonidine     REACTION: rash    Review of Systems  Constitutional: Negative for activity change, appetite change, chills, fatigue and fever.  HENT: Negative.   Eyes: Negative.   Respiratory: Negative for cough, chest tightness and shortness of breath.   Cardiovascular: Negative for chest pain, palpitations and leg swelling.  Gastrointestinal: Negative for blood in stool, constipation, diarrhea, nausea and vomiting.  Endocrine: Negative.   Genitourinary: Negative for dysuria, frequency and urgency.  Musculoskeletal: Negative for arthralgias and myalgias.  Skin: Positive for color change and wound.  Allergic/Immunologic: Negative.   Neurological: Negative for dizziness and headaches.  Hematological: Negative.   Psychiatric/Behavioral: Negative for confusion, hallucinations, sleep disturbance and suicidal ideas.  All other systems reviewed and are negative.       Objective:  BP 112/69   Pulse 77   Temp (!) 97.1 F (36.2 C)   Resp 20   Ht 5' 7.5" (1.715 m)   Wt 143 lb (64.9 kg)   SpO2 97%   BMI 22.07 kg/m    Wt Readings from Last  3 Encounters:  06/12/19 143 lb (64.9 kg)  05/25/19 143 lb 3.2 oz (65 kg)  02/02/19 141 lb (64 kg)    Physical Exam Vitals and nursing note reviewed.  Constitutional:      General: He is not in acute distress.    Appearance: Normal appearance. He is well-developed and well-groomed. He is not ill-appearing, toxic-appearing or diaphoretic.  HENT:     Head: Normocephalic and atraumatic.     Jaw: There is normal jaw occlusion.     Right Ear: Hearing normal.     Left Ear: Hearing normal.     Nose: Nose normal.  Mouth/Throat:     Lips: Pink.     Mouth: Mucous membranes are moist.     Pharynx: Oropharynx is clear. Uvula midline.  Eyes:     General: Lids are normal.     Extraocular Movements: Extraocular movements intact.     Conjunctiva/sclera: Conjunctivae normal.     Pupils: Pupils are equal, round, and reactive to light.  Neck:     Thyroid: No thyroid mass, thyromegaly or thyroid tenderness.     Vascular: No carotid bruit or JVD.     Trachea: Trachea and phonation normal.  Cardiovascular:     Rate and Rhythm: Normal rate and regular rhythm.     Chest Wall: PMI is not displaced.     Pulses: Normal pulses.     Heart sounds: Normal heart sounds. No murmur. No friction rub. No gallop.   Pulmonary:     Effort: Pulmonary effort is normal. No respiratory distress.     Breath sounds: Normal breath sounds. No wheezing.  Abdominal:     General: Bowel sounds are normal. There is no distension or abdominal bruit.     Palpations: Abdomen is soft. There is no hepatomegaly or splenomegaly.     Tenderness: There is no abdominal tenderness. There is no right CVA tenderness or left CVA tenderness.     Hernia: No hernia is present.  Musculoskeletal:        General: Normal range of motion.     Cervical back: Normal range of motion and neck supple.     Right lower leg: No edema.     Left lower leg: No edema.       Feet:  Feet:     Right foot:     Skin integrity: Skin breakdown and  erythema present.  Lymphadenopathy:     Cervical: No cervical adenopathy.  Skin:    General: Skin is warm and dry.     Capillary Refill: Capillary refill takes less than 2 seconds.     Coloration: Skin is not cyanotic, jaundiced or pale.     Findings: Erythema and wound present. No rash.     Comments: Wound to right second toe  Neurological:     General: No focal deficit present.     Mental Status: He is alert and oriented to person, place, and time.     Cranial Nerves: Cranial nerves are intact.     Sensory: Sensation is intact.     Motor: Motor function is intact.     Coordination: Coordination is intact.     Gait: Gait is intact.     Deep Tendon Reflexes: Reflexes are normal and symmetric.  Psychiatric:        Attention and Perception: Attention and perception normal.        Mood and Affect: Mood and affect normal.        Speech: Speech normal.        Behavior: Behavior normal. Behavior is cooperative.        Thought Content: Thought content normal.        Cognition and Memory: Cognition and memory normal.        Judgment: Judgment normal.     Results for orders placed or performed in visit on 05/25/19  wound culture   Specimen: Toe; Wound   TE  Result Value Ref Range   Anaerobic Culture Final report    Result 1 Comment    Aerobic Culture Final report (A)    Result 1 Staphylococcus aureus (A)  Result 2 Mixed skin flora    Antimicrobial Susceptibility Comment        Pertinent labs & imaging results that were available during my care of the patient were reviewed by me and considered in my medical decision making.  Assessment & Plan:  Sofia was seen today for recurrent skin infections.  Diagnoses and all orders for this visit:  Cellulitis and abscess of toe of right foot Wound culture revealed staphylococcus aureus that is susceptible to Augmentin. Pt completed bactrim and wound is still present. Will initiate below. Wound care discussed in detail. Follow up in  2 weeks for reevaluation.   -     amoxicillin-clavulanate (AUGMENTIN) 875-125 MG tablet; Take 1 tablet by mouth 2 (two) times daily for 10 days. -     mupirocin ointment (BACTROBAN) 2 %; PLACE 1 APPLICATION INTO THE NOSE 2 TIMES A DAY     Continue all other maintenance medications.  Follow up plan: Return in about 2 weeks (around 06/26/2019), or if symptoms worsen or fail to improve.  Continue healthy lifestyle choices, including diet (rich in fruits, vegetables, and lean proteins, and low in salt and simple carbohydrates) and exercise (at least 30 minutes of moderate physical activity daily).  Educational handout given for cellulitis   The above assessment and management plan was discussed with the patient. The patient verbalized understanding of and has agreed to the management plan. Patient is aware to call the clinic if they develop any new symptoms or if symptoms persist or worsen. Patient is aware when to return to the clinic for a follow-up visit. Patient educated on when it is appropriate to go to the emergency department.   Kari Baars, FNP-C [facility] [phone]

## 2019-06-27 ENCOUNTER — Other Ambulatory Visit: Payer: Self-pay | Admitting: Family Medicine

## 2019-06-30 ENCOUNTER — Ambulatory Visit: Payer: Medicare Other | Admitting: Family Medicine

## 2019-07-09 ENCOUNTER — Emergency Department (HOSPITAL_COMMUNITY)
Admission: EM | Admit: 2019-07-09 | Discharge: 2019-07-09 | Disposition: A | Discharge status: Home / Self Care | Payer: Medicare Other | Attending: Emergency Medicine | Admitting: Emergency Medicine

## 2019-07-09 ENCOUNTER — Emergency Department (HOSPITAL_COMMUNITY): Payer: Medicare Other

## 2019-07-09 ENCOUNTER — Other Ambulatory Visit: Payer: Self-pay

## 2019-07-09 ENCOUNTER — Encounter (HOSPITAL_COMMUNITY): Payer: Self-pay | Admitting: Emergency Medicine

## 2019-07-09 DIAGNOSIS — I2510 Atherosclerotic heart disease of native coronary artery without angina pectoris: Secondary | ICD-10-CM | POA: Diagnosis not present

## 2019-07-09 DIAGNOSIS — Y9389 Activity, other specified: Secondary | ICD-10-CM | POA: Diagnosis not present

## 2019-07-09 DIAGNOSIS — J189 Pneumonia, unspecified organism: Secondary | ICD-10-CM | POA: Diagnosis not present

## 2019-07-09 DIAGNOSIS — Z7982 Long term (current) use of aspirin: Secondary | ICD-10-CM | POA: Diagnosis not present

## 2019-07-09 DIAGNOSIS — F17210 Nicotine dependence, cigarettes, uncomplicated: Secondary | ICD-10-CM | POA: Diagnosis not present

## 2019-07-09 DIAGNOSIS — Y998 Other external cause status: Secondary | ICD-10-CM | POA: Insufficient documentation

## 2019-07-09 DIAGNOSIS — Y92018 Other place in single-family (private) house as the place of occurrence of the external cause: Secondary | ICD-10-CM | POA: Insufficient documentation

## 2019-07-09 DIAGNOSIS — R748 Abnormal levels of other serum enzymes: Secondary | ICD-10-CM | POA: Insufficient documentation

## 2019-07-09 DIAGNOSIS — R4182 Altered mental status, unspecified: Secondary | ICD-10-CM | POA: Diagnosis present

## 2019-07-09 DIAGNOSIS — J449 Chronic obstructive pulmonary disease, unspecified: Secondary | ICD-10-CM | POA: Diagnosis not present

## 2019-07-09 DIAGNOSIS — Z79899 Other long term (current) drug therapy: Secondary | ICD-10-CM | POA: Insufficient documentation

## 2019-07-09 DIAGNOSIS — R531 Weakness: Secondary | ICD-10-CM | POA: Diagnosis not present

## 2019-07-09 DIAGNOSIS — R410 Disorientation, unspecified: Secondary | ICD-10-CM | POA: Diagnosis not present

## 2019-07-09 DIAGNOSIS — W19XXXA Unspecified fall, initial encounter: Secondary | ICD-10-CM | POA: Insufficient documentation

## 2019-07-09 DIAGNOSIS — W010XXA Fall on same level from slipping, tripping and stumbling without subsequent striking against object, initial encounter: Secondary | ICD-10-CM

## 2019-07-09 DIAGNOSIS — I10 Essential (primary) hypertension: Secondary | ICD-10-CM | POA: Diagnosis not present

## 2019-07-09 LAB — CBC
HCT: 39.6 % (ref 39.0–52.0)
Hemoglobin: 13 g/dL (ref 13.0–17.0)
MCH: 33.2 pg (ref 26.0–34.0)
MCHC: 32.8 g/dL (ref 30.0–36.0)
MCV: 101 fL — ABNORMAL HIGH (ref 80.0–100.0)
Platelets: 205 10*3/uL (ref 150–400)
RBC: 3.92 MIL/uL — ABNORMAL LOW (ref 4.22–5.81)
RDW: 13.2 % (ref 11.5–15.5)
WBC: 19.2 10*3/uL — ABNORMAL HIGH (ref 4.0–10.5)
nRBC: 0 % (ref 0.0–0.2)

## 2019-07-09 LAB — COMPREHENSIVE METABOLIC PANEL
ALT: 23 U/L (ref 0–44)
AST: 50 U/L — ABNORMAL HIGH (ref 15–41)
Albumin: 4.1 g/dL (ref 3.5–5.0)
Alkaline Phosphatase: 64 U/L (ref 38–126)
Anion gap: 8 (ref 5–15)
BUN: 20 mg/dL (ref 8–23)
CO2: 25 mmol/L (ref 22–32)
Calcium: 9.6 mg/dL (ref 8.9–10.3)
Chloride: 105 mmol/L (ref 98–111)
Creatinine, Ser: 0.9 mg/dL (ref 0.61–1.24)
GFR calc Af Amer: >60 mL/min (ref >60)
GFR calc non Af Amer: >60 mL/min (ref >60)
Glucose, Bld: 116 mg/dL — ABNORMAL HIGH (ref 70–99)
Potassium: 3.9 mmol/L (ref 3.5–5.1)
Sodium: 138 mmol/L (ref 135–145)
Total Bilirubin: 1 mg/dL (ref 0.3–1.2)
Total Protein: 7.1 g/dL (ref 6.5–8.1)

## 2019-07-09 LAB — URINALYSIS, ROUTINE W REFLEX MICROSCOPIC
Bilirubin Urine: NEGATIVE
Glucose, UA: NEGATIVE mg/dL
Hgb urine dipstick: NEGATIVE
Ketones, ur: NEGATIVE mg/dL
Leukocytes,Ua: NEGATIVE
Nitrite: NEGATIVE
Protein, ur: NEGATIVE mg/dL
Specific Gravity, Urine: 1.017 (ref 1.005–1.030)
pH: 6 (ref 5.0–8.0)

## 2019-07-09 LAB — TROPONIN I (HIGH SENSITIVITY): Troponin I (High Sensitivity): 11 ng/L (ref <18)

## 2019-07-09 LAB — CK: Total CK: 1909 U/L — ABNORMAL HIGH (ref 49–397)

## 2019-07-09 LAB — RAPID URINE DRUG SCREEN, HOSP PERFORMED
Amphetamines: NOT DETECTED
Barbiturates: NOT DETECTED
Benzodiazepines: POSITIVE — AB
Cocaine: NOT DETECTED
Opiates: NOT DETECTED
Tetrahydrocannabinol: POSITIVE — AB

## 2019-07-09 LAB — ETHANOL: Alcohol, Ethyl (B): <10 mg/dL (ref <10)

## 2019-07-09 LAB — LACTIC ACID, PLASMA: Lactic Acid, Venous: 1.1 mmol/L (ref 0.5–1.9)

## 2019-07-09 MED ORDER — SODIUM CHLORIDE 0.9 % IV SOLN
500.0000 mg | Freq: Once | INTRAVENOUS | Status: AC
  Administered 2019-07-09: 500 mg via INTRAVENOUS
  Filled 2019-07-09: qty 500

## 2019-07-09 MED ORDER — SODIUM CHLORIDE 0.9 % IV SOLN
1.0000 g | Freq: Once | INTRAVENOUS | Status: AC
  Administered 2019-07-09: 1 g via INTRAVENOUS
  Filled 2019-07-09: qty 10

## 2019-07-09 MED ORDER — SODIUM CHLORIDE 0.9 % IV BOLUS
1000.0000 mL | Freq: Once | INTRAVENOUS | Status: AC
  Administered 2019-07-09: 1000 mL via INTRAVENOUS

## 2019-07-09 MED ORDER — AZITHROMYCIN 250 MG PO TABS: 250.0000 mg | ORAL_TABLET | Freq: Every day | ORAL | 0 refills | Status: AC

## 2019-07-09 MED ORDER — CEFDINIR 300 MG PO CAPS: 300.0000 mg | ORAL_CAPSULE | Freq: Two times a day (BID) | ORAL | 0 refills | Status: DC

## 2019-07-09 NOTE — ED Notes (Signed)
Pt unable to sign. Discharge instructions reviewed over the phone by Juliette Alcide, RN.

## 2019-07-09 NOTE — ED Triage Notes (Signed)
Pt brought in by ems after suffering a fall about 6 hours ago. Pt has some dementia and is difficult to assess. Family states his confusion is worse than usual.

## 2019-07-09 NOTE — ED Notes (Addendum)
Rn spoke with pt's sister Francoise Schaumann and was advised that pt's caretaker would be picking him up. Discharge instructions reviewed with sister who expressed understanding.

## 2019-07-09 NOTE — ED Notes (Signed)
Spoke with pt's sister, Adonis Brook, who states pt normally gets up and puts his coffee on and then changes clothes. She noticed this morning at 0930 the dogs were scratching on the door and she yelled at him and asked if he fell and he responded "yes". Pt had his bedroom door locked. Neighbor had to come kick in the door and assist pt out of the floor. Pt was not complaining of anything at that time. Pt normally is confused to date/time but is aware of situation. Can carry out most ADLs but does require some assist. 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 States pt kept repeating he had to urinate and was unable to get out of his chair which he is normally able to do.

## 2019-07-09 NOTE — ED Provider Notes (Signed)
[facility] EMERGENCY DEPARTMENT Provider Note   CSN: [id number] Arrival date & time: 07/09/19  1458     History Chief Complaint  Patient presents with  . Fall    [name] is a 64 y.o. male.  Patient presents via EMS with report of altered mental status post fall. Hx confusion/?dementia at baseline - EMS notes fall 6 hours ago, and family noting increased confusion, general weakness, decreased verbal responsiveness post fall. Patient states he is fine, and denies specific pain or other complaint - pt very limited historian - level 5 caveat. Additional hx from rn/family - patient had not been up and about, so around 930 family checked on pt in room, and he indicated he had fallen when up to bathroom, unclear how long on floor before family found/helped up - indicates pt seemed generally weak, states at baseline, some confusion and weakness/gait unsteadiness due to prior remote tbi.   The history is provided by the patient and the EMS personnel. The history is limited by the condition of the patient.  Fall Pertinent negatives include no chest pain, no headaches and no shortness of breath.       Past Medical History:  Diagnosis Date  . Anxiety   . Cerebral aneurysm   . Closed head injury   . COPD (chronic obstructive pulmonary disease) (HCC)   . Depression   . GERD (gastroesophageal reflux disease)   . Hyperlipidemia   . Hypertension   . Seizures (HCC)   . Sleep apnea     Patient Active Problem List   Diagnosis Date Noted  . Controlled substance agreement signed 03/14/2018  . Memory loss or impairment 07/08/2016  . Loss of weight 07/07/2013  . Frequent urination 11/04/2012  . Seizure disorder (HCC) 11/04/2012  . RLS (restless legs syndrome) 11/04/2012  . CAD (coronary artery disease) 11/04/2012  . HEAD TRAUMA, CLOSED 04/16/2009  . Hyperlipidemia 06/05/2008  . Generalized anxiety disorder 06/05/2008  . History of alcohol abuse 06/05/2008  . TOBACCO ABUSE  06/05/2008  . Depression, recurrent (HCC) 06/05/2008  . OBSTRUCTIVE SLEEP APNEA 06/05/2008  . Essential hypertension 06/05/2008  . CEREBRAL ANEURYSM 06/05/2008  . COPD (chronic obstructive pulmonary disease) (HCC) 06/05/2008  . GERD 06/05/2008  . HEADACHE, CHRONIC 06/05/2008    History reviewed. No pertinent surgical history.     History reviewed. No pertinent family history.  Social History   Tobacco Use  . Smoking status: Current Every Day Smoker    Packs/day: 1.00    Years: 50.00    Pack years: 50.00  . Smokeless tobacco: Never Used  Substance Use Topics  . Alcohol use: No  . Drug use: No    Home Medications Prior to Admission medications   Medication Sig Start Date End Date Taking? Authorizing Provider  albuterol (VENTOLIN HFA) 108 (90 Base) MCG/ACT inhaler 2 PUFFS EVERY 6 HOURS AS NEEDED FOR WHEEZING OR SHORTNESS OF BREATH 06/27/19   Dettinger, Elige Radon, MD  ALPRAZolam Prudy Feeler) 1 MG tablet Take 1 tablet (1 mg total) by mouth 3 (three) times daily as needed for anxiety. 05/08/19   Dettinger, Elige Radon, MD  aspirin 81 MG chewable tablet Chew 81 mg by mouth daily.    [provider]  esomeprazole (NEXIUM) 40 MG capsule TAKE (1) CAPSULE DAILY 06/27/19   Dettinger, Elige Radon, MD  fluticasone (FLONASE) 50 MCG/ACT nasal spray Place 1 spray into both nostrils daily. 11/12/17   Mechele Claude, MD  ipratropium-albuterol (DUONEB) 0.5-2.5 (3) MG/3ML SOLN Take 3 mLs  by nebulization every 4 (four) hours as needed. 11/02/18 10/28/19  Dettinger, Elige Radon, MD  metoprolol succinate (TOPROL-XL) 25 MG 24 hr tablet Take 1 tablet (25 mg total) by mouth daily. 11/02/18   Dettinger, Elige Radon, MD  mirabegron ER (MYRBETRIQ) 25 MG TB24 tablet Take 1 tablet (25 mg total) by mouth daily. 04/05/19   Dettinger, Elige Radon, MD  mupirocin ointment (BACTROBAN) 2 % PLACE 1 APPLICATION INTO THE NOSE 2 TIMES A DAY 06/12/19   Sonny Masters, FNP  niacin (NIASPAN) 1000 MG CR tablet Take 1 tablet (1,000 mg  total) by mouth at bedtime. 11/02/18   Dettinger, Elige Radon, MD  pramipexole (MIRAPEX) 0.5 MG tablet TAKE ONE TABLET AT BEDTIME 01/30/19   Dettinger, Elige Radon, MD  risperiDONE (RISPERDAL) 3 MG tablet Take 1 tablet (3 mg total) by mouth at bedtime. 05/08/19   Dettinger, Elige Radon, MD  simvastatin (ZOCOR) 20 MG tablet Take 1 tablet (20 mg total) by mouth at bedtime. 11/02/18   Dettinger, Elige Radon, MD  solifenacin (VESICARE) 5 MG tablet TAKE (2) TABLETS DAILY 08/01/18   Dettinger, Elige Radon, MD  Spacer/Aero-Holding Deretha Emory DEVI 1 each by Does not apply route as needed. 05/25/19   Dettinger, Elige Radon, MD    Allergies    Clonidine  Review of Systems   Review of Systems  Unable to perform ROS: Mental status change  Constitutional: Negative for fever.  Respiratory: Negative for shortness of breath.        Occasionally non prod cough.   Cardiovascular: Negative for chest pain.  Gastrointestinal: Negative for anal bleeding and vomiting.  Musculoskeletal: Negative for neck pain.  Neurological: Negative for weakness, numbness and headaches.  patient with confusion/decreased responsiveness - level 5 caveat  Physical Exam Updated Vital Signs Ht 1.829 m (6')   Wt 65 kg   BMI 19.43 kg/m   Physical Exam Vitals and nursing note reviewed.  Constitutional:      Appearance: Normal appearance. He is well-developed.  HENT:     Head: Atraumatic.     Nose: Nose normal.     Mouth/Throat:     Mouth: Mucous membranes are moist.     Pharynx: Oropharynx is clear.  Eyes:     General: No scleral icterus.    Conjunctiva/sclera: Conjunctivae normal.     Pupils: Pupils are equal, round, and reactive to light.  Neck:     Vascular: No carotid bruit.     Trachea: No tracheal deviation.  Cardiovascular:     Rate and Rhythm: Normal rate and regular rhythm.     Pulses: Normal pulses.     Heart sounds: Normal heart sounds. No murmur. No friction rub. No gallop.   Pulmonary:     Effort: Pulmonary effort is  normal. No accessory muscle usage or respiratory distress.     Breath sounds: Normal breath sounds.  Chest:     Chest wall: No tenderness.  Abdominal:     General: Bowel sounds are normal. There is no distension.     Palpations: Abdomen is soft. There is no mass.     Tenderness: There is no abdominal tenderness. There is no guarding or rebound.     Hernia: No hernia is present.  Genitourinary:    Comments: No cva tenderness. Musculoskeletal:        General: No swelling.     Cervical back: Normal range of motion and neck supple. No rigidity or tenderness.     Comments: CTLS spine, non tender, aligned,  no step off. Good rom bil ext without pain or focal bony tenderness.   Skin:    General: Skin is warm and dry.     Findings: No rash.  Neurological:     Mental Status: He is alert.     Comments: Alert, poorly responsive to questions - occasionally answers yes or no, and responds to name w 'Twain'. No facial asymmetry/weakness.  Moves bil extremities purposefully, but poorly follows commands. No pronator drift. Strength 4+/5 bil. sens grossly intact.   Psychiatric:        Mood and Affect: Mood normal.     ED Results / Procedures / Treatments   Labs (all labs ordered are listed, but only abnormal results are displayed) Results for orders placed or performed during the hospital encounter of 07/09/19  CBC  Result Value Ref Range   WBC 19.2 (H) 4.0 - 10.5 K/uL   RBC 3.92 (L) 4.22 - 5.81 MIL/uL   Hemoglobin 13.0 13.0 - 17.0 g/dL   HCT 40.7 68.0 - 88.1 %   MCV 101.0 (H) 80.0 - 100.0 fL   MCH 33.2 26.0 - 34.0 pg   MCHC 32.8 30.0 - 36.0 g/dL   RDW 10.3 15.9 - 45.8 %   Platelets 205 150 - 400 K/uL   nRBC 0.0 0.0 - 0.2 %  Comprehensive metabolic panel  Result Value Ref Range   Sodium 138 135 - 145 mmol/L   Potassium 3.9 3.5 - 5.1 mmol/L   Chloride 105 98 - 111 mmol/L   CO2 25 22 - 32 mmol/L   Glucose, Bld 116 (H) 70 - 99 mg/dL   BUN 20 8 - 23 mg/dL   Creatinine, Ser 5.92 0.61 -  1.24 mg/dL   Calcium 9.6 8.9 - 92.4 mg/dL   Total Protein 7.1 6.5 - 8.1 g/dL   Albumin 4.1 3.5 - 5.0 g/dL   AST 50 (H) 15 - 41 U/L   ALT 23 0 - 44 U/L   Alkaline Phosphatase 64 38 - 126 U/L   Total Bilirubin 1.0 0.3 - 1.2 mg/dL   GFR calc non Af Amer >60 >60 mL/min   GFR calc Af Amer >60 >60 mL/min   Anion gap 8 5 - 15  Urinalysis, Routine w reflex microscopic  Result Value Ref Range   Color, Urine YELLOW YELLOW   APPearance CLEAR CLEAR   Specific Gravity, Urine 1.017 1.005 - 1.030   pH 6.0 5.0 - 8.0   Glucose, UA NEGATIVE NEGATIVE mg/dL   Hgb urine dipstick NEGATIVE NEGATIVE   Bilirubin Urine NEGATIVE NEGATIVE   Ketones, ur NEGATIVE NEGATIVE mg/dL   Protein, ur NEGATIVE NEGATIVE mg/dL   Nitrite NEGATIVE NEGATIVE   Leukocytes,Ua NEGATIVE NEGATIVE  Ethanol  Result Value Ref Range   Alcohol, Ethyl (B) <10 <10 mg/dL  Rapid urine drug screen (hospital performed)  Result Value Ref Range   Opiates NONE DETECTED NONE DETECTED   Cocaine NONE DETECTED NONE DETECTED   Benzodiazepines POSITIVE (A) NONE DETECTED   Amphetamines NONE DETECTED NONE DETECTED   Tetrahydrocannabinol POSITIVE (A) NONE DETECTED   Barbiturates NONE DETECTED NONE DETECTED  CK  Result Value Ref Range   Total CK 1,909 (H) 49 - 397 U/L  Lactic acid, plasma  Result Value Ref Range   Lactic Acid, Venous 1.1 0.5 - 1.9 mmol/L  Troponin I (High Sensitivity)  Result Value Ref Range   Troponin I (High Sensitivity) 11 <18 ng/L   DG Chest 2 View  Result Date: 07/09/2019 CLINICAL  DATA:  Weakness. EXAM: CHEST - 2 VIEW COMPARISON:  07/22/2017 FINDINGS: There is elevation of the right hemidiaphragm. There is slightly prominent interstitial lung markings bilaterally. There is a radiopaque density projecting over the upper mediastinum, likely external to the patient. There is a probable small right-sided pleural effusion. There are streaky bibasilar airspace opacities. There is no pneumothorax. No acute osseous abnormality.  There are old healed left-sided rib fractures. IMPRESSION: 1. Possible mild developing interstitial edema. 2. Elevation of the right hemidiaphragm with a possible right-sided pleural effusion. 3. Streaky bibasilar airspace opacities favored to represent atelectasis with an infiltrate not excluded. 4. Small metallic density projecting over the upper mediastinum may be external to the patient. Correlation with physical exam is recommended. Electronically Signed   By: Katherine Mantlehristopher  Green M.D.   On: 07/09/2019 18:14   CT HEAD WO CONTRAST  Result Date: 07/09/2019 CLINICAL DATA:  Status post fall today. History of dementia. EXAM: CT HEAD WITHOUT CONTRAST TECHNIQUE: Contiguous axial images were obtained from the base of the skull through the vertex without intravenous contrast. COMPARISON:  Brain MRI 08/24/2013. Head CT 06/09/2006. FINDINGS: Brain: No evidence of acute infarction, hemorrhage, hydrocephalus, extra-axial collection or mass lesion/mass effect. Mild atrophy, extensive chronic microvascular ischemic change and lacunar infarction in the left thalamus noted. Vascular: Extensive atherosclerosis. No hyperdense vessel. Skull: Intact. No focal lesion. Sinuses/Orbits: There is some mucosal thickening in the maxillary sinuses bilaterally with wall thickening. Mucous retention cyst or polyp left maxillary sinus also noted. Other: None. IMPRESSION: 1. No acute abnormality. 2. Atrophy and extensive chronic microvascular ischemic change and. 3. Atherosclerosis. 4. Chronic bilateral maxillary sinus disease. Electronically Signed   By: Drusilla Kannerhomas  Dalessio M.D.   On: 07/09/2019 15:53    EKG EKG Interpretation  Date/Time:  Sunday July 09 2019 15:38:31 EST Ventricular Rate:  74 PR Interval:    QRS Duration: 89 QT Interval:  402 QTC Calculation: 446 R Axis:   67 Text Interpretation: Sinus rhythm Nonspecific ST abnormality No significant change since last tracing Confirmed by Cathren LaineSteinl, Gloria Ricardo ([id number]) on 07/09/2019  3:52:57 PM   Radiology DG Chest 2 View  Result Date: 07/09/2019 CLINICAL DATA:  Weakness. EXAM: CHEST - 2 VIEW COMPARISON:  07/22/2017 FINDINGS: There is elevation of the right hemidiaphragm. There is slightly prominent interstitial lung markings bilaterally. There is a radiopaque density projecting over the upper mediastinum, likely external to the patient. There is a probable small right-sided pleural effusion. There are streaky bibasilar airspace opacities. There is no pneumothorax. No acute osseous abnormality. There are old healed left-sided rib fractures. IMPRESSION: 1. Possible mild developing interstitial edema. 2. Elevation of the right hemidiaphragm with a possible right-sided pleural effusion. 3. Streaky bibasilar airspace opacities favored to represent atelectasis with an infiltrate not excluded. 4. Small metallic density projecting over the upper mediastinum may be external to the patient. Correlation with physical exam is recommended. Electronically Signed   By: Katherine Mantlehristopher  Green M.D.   On: 07/09/2019 18:14   CT HEAD WO CONTRAST  Result Date: 07/09/2019 CLINICAL DATA:  Status post fall today. History of dementia. EXAM: CT HEAD WITHOUT CONTRAST TECHNIQUE: Contiguous axial images were obtained from the base of the skull through the vertex without intravenous contrast. COMPARISON:  Brain MRI 08/24/2013. Head CT 06/09/2006. FINDINGS: Brain: No evidence of acute infarction, hemorrhage, hydrocephalus, extra-axial collection or mass lesion/mass effect. Mild atrophy, extensive chronic microvascular ischemic change and lacunar infarction in the left thalamus noted. Vascular: Extensive atherosclerosis. No hyperdense vessel. Skull: Intact. No focal lesion. Sinuses/Orbits:  There is some mucosal thickening in the maxillary sinuses bilaterally with wall thickening. Mucous retention cyst or polyp left maxillary sinus also noted. Other: None. IMPRESSION: 1. No acute abnormality. 2. Atrophy and extensive  chronic microvascular ischemic change and. 3. Atherosclerosis. 4. Chronic bilateral maxillary sinus disease. Electronically Signed   By: Inge Rise M.D.   On: 07/09/2019 15:53    Procedures Procedures (including critical care time)  Medications Ordered in ED Medications - No data to display  ED Course  I have reviewed the triage vital signs and the nursing notes.  Pertinent labs & imaging results that were available during my care of the patient were reviewed by me and considered in my medical decision making (see chart for details).    MDM Rules/Calculators/A&P                      Continuous pulse ox and monitoring. Ecg. Stat labs and imaging.  Reviewed nursing notes and prior charts for additional history.   ECG similar to priors. No chest pain. ~10 hours post being found on floor by family, trop normal.   Additional labs reviewed/interpreted by me - wbc elev. ua neg for infection. CXR ?basilar infiltrate, possible pna. Given occ cough, wbc, will tx for possible pna.  Lactate is normal. Ck is high ?related to period on floor earlier, ua neg for bl/myo. Iv ns boluses. Po fluids.   [name] was evaluated in Emergency Department on 07/09/2019 for the symptoms described in the history of present illness. He was evaluated in the context of the global COVID-19 pandemic, which necessitated consideration that the patient might be at risk for infection with the SARS-CoV-2 virus that causes COVID-19. Institutional protocols and algorithms that pertain to the evaluation of patients at risk for COVID-19 are in a state of rapid change based on information released by regulatory bodies including the CDC and federal and state organizations. These policies and algorithms were followed during the patient's care in the ED.  covid test negative.   Recheck pt, tolerating po, comfortable appearing, no increased wob. Patient continues to deny pain - no headache, no cp, no abd pain.   On  severael rechecks, vitals normal, no new c/o, no pain - pt currently appears stable for d/c.   Rec pcp f/u.  Return precautions provided.       Final Clinical Impression(s) / ED Diagnoses Final diagnoses:  None    Rx / DC Orders ED Discharge Orders    None       Lajean Saver, MD 07/10/19 1304

## 2019-07-09 NOTE — Discharge Instructions (Addendum)
It was our pleasure to provide your ER care today - we hope that you feel better.  Rest. Drink plenty of fluids/water. Fall precautions.   We have also made a home health referral - they should be contacting you in the next couple of days.   Your xrays show a possible pneumonia - take antibiotics as prescribed. Your covid test remains pending - see covid instructions/precautions - you may check MyChart or call for results.   Follow up with primary care doctor in the coming week.  Return to ER if worse, new symptoms, high fevers, chest pain, trouble breathing, weak/fainting, new or severe pain, or other concern.

## 2019-07-28 ENCOUNTER — Other Ambulatory Visit: Payer: Self-pay | Admitting: Family Medicine

## 2019-08-03 ENCOUNTER — Other Ambulatory Visit: Payer: Self-pay | Admitting: Family Medicine

## 2019-08-03 DIAGNOSIS — F339 Major depressive disorder, recurrent, unspecified: Secondary | ICD-10-CM

## 2019-08-03 DIAGNOSIS — F411 Generalized anxiety disorder: Secondary | ICD-10-CM

## 2019-08-04 ENCOUNTER — Other Ambulatory Visit: Payer: Self-pay | Admitting: Family Medicine

## 2019-08-04 DIAGNOSIS — F411 Generalized anxiety disorder: Secondary | ICD-10-CM

## 2019-08-04 DIAGNOSIS — F339 Major depressive disorder, recurrent, unspecified: Secondary | ICD-10-CM

## 2019-08-08 ENCOUNTER — Other Ambulatory Visit: Payer: Self-pay

## 2019-08-09 ENCOUNTER — Other Ambulatory Visit: Payer: Self-pay | Admitting: Family Medicine

## 2019-08-09 ENCOUNTER — Encounter: Payer: Self-pay | Admitting: Family Medicine

## 2019-08-09 ENCOUNTER — Ambulatory Visit (INDEPENDENT_AMBULATORY_CARE_PROVIDER_SITE_OTHER): Payer: Medicare Other | Admitting: Family Medicine

## 2019-08-09 VITALS — BP 107/62 | HR 74 | Temp 98.0°F | Ht 72.0 in | Wt 138.2 lb

## 2019-08-09 DIAGNOSIS — F1210 Cannabis abuse, uncomplicated: Secondary | ICD-10-CM | POA: Diagnosis not present

## 2019-08-09 DIAGNOSIS — I10 Essential (primary) hypertension: Secondary | ICD-10-CM

## 2019-08-09 DIAGNOSIS — Z1211 Encounter for screening for malignant neoplasm of colon: Secondary | ICD-10-CM

## 2019-08-09 DIAGNOSIS — F339 Major depressive disorder, recurrent, unspecified: Secondary | ICD-10-CM

## 2019-08-09 DIAGNOSIS — G4700 Insomnia, unspecified: Secondary | ICD-10-CM

## 2019-08-09 DIAGNOSIS — F411 Generalized anxiety disorder: Secondary | ICD-10-CM | POA: Diagnosis not present

## 2019-08-09 DIAGNOSIS — E782 Mixed hyperlipidemia: Secondary | ICD-10-CM

## 2019-08-09 MED ORDER — ALPRAZOLAM 1 MG PO TABS: ORAL_TABLET | ORAL | 0 refills | Status: DC

## 2019-08-09 MED ORDER — MELATONIN 10 MG PO TABS: 10.0000 mg | ORAL_TABLET | Freq: Every day | ORAL | 3 refills | Status: DC

## 2019-08-09 MED ORDER — RISPERIDONE 4 MG PO TABS: 4.0000 mg | ORAL_TABLET | Freq: Every day | ORAL | 3 refills | Status: DC

## 2019-08-09 MED ORDER — PRAMIPEXOLE DIHYDROCHLORIDE 0.5 MG PO TABS: 0.5000 mg | ORAL_TABLET | Freq: Every day | ORAL | 1 refills | Status: DC

## 2019-08-09 NOTE — Patient Instructions (Signed)
Gave instructions for taper off of Xanax and gave 2 medication changes, 1 increased Risperdal and to added melatonin

## 2019-08-09 NOTE — Progress Notes (Signed)
BP 107/62   Pulse 74   Temp 98 F (36.7 C) (Temporal)   Ht 6' (1.829 m)   Wt 138 lb 3.2 oz (62.7 kg)   SpO2 99%   BMI 18.74 kg/m    Subjective:   Patient ID: [name], male    DOB: 05/22/1956, 64 y.o.   MRN: [id number]  HPI: [name] is a 64 y.o. male presenting on 08/09/2019 for Anxiety (3 month follow up) and Weight Loss   HPI Anxiety and insomnia recheck Patient is coming in for anxiety and insomnia recheck.  He recently was in the hospital a month ago and had a drug test that came back positive for cannabinoids.  Patient has been using it mostly for insomnia.  Patient says his anxiety is doing okay during the day but mainly at nighttime.  He takes Risperdal and alprazolam  Hypertension Patient is currently on metoprolol, and their blood pressure today is 107/62. Patient denies any lightheadedness or dizziness. Patient denies headaches, blurred vision, chest pains, shortness of breath, or weakness. Denies any side effects from medication and is content with current medication.   Hyperlipidemia Patient is coming in for recheck of his hyperlipidemia. The patient is currently taking simvastatin and niacin. They deny any issues with myalgias or history of liver damage from it. They deny any focal numbness or weakness or chest pain.  He has recently come down on his appetite and is losing some weight and has lost 9 pounds over the past few months.  Relevant past medical, surgical, family and social history reviewed and updated as indicated. Interim medical history since our last visit reviewed. Allergies and medications reviewed and updated.  Review of Systems  Constitutional: Negative for chills and fever.  Eyes: Negative for visual disturbance.  Respiratory: Negative for shortness of breath and wheezing.   Cardiovascular: Negative for chest pain and leg swelling.  Musculoskeletal: Negative for back pain and gait problem.  Skin: Negative for rash.    Neurological: Negative for dizziness and weakness.  Psychiatric/Behavioral: Positive for sleep disturbance. Negative for decreased concentration, dysphoric mood, self-injury and suicidal ideas. The patient is nervous/anxious.   All other systems reviewed and are negative.   Per HPI unless specifically indicated above   Allergies as of 08/09/2019      Reactions   Clonidine    REACTION: rash      Medication List       Accurate as of August 09, 2019  1:26 PM. If you have any questions, ask your nurse or doctor.        STOP taking these medications   cefdinir 300 MG capsule Commonly known as: OMNICEF Stopped by: Fransisca Kaufmann Carel Schnee, MD     TAKE these medications   albuterol 108 (90 Base) MCG/ACT inhaler Commonly known as: Ventolin HFA 2 PUFFS EVERY 6 HOURS AS NEEDED FOR WHEEZING OR SHORTNESS OF BREATH   ALPRAZolam 1 MG tablet Commonly known as: XANAX Take 1 tablet (1 mg total) by mouth 3 (three) times daily as needed for anxiety.   aspirin 81 MG chewable tablet Chew 81 mg by mouth daily.   esomeprazole 40 MG capsule Commonly known as: NEXIUM TAKE (1) CAPSULE DAILY   fluticasone 50 MCG/ACT nasal spray Commonly known as: FLONASE Place 1 spray into both nostrils daily.   ipratropium-albuterol 0.5-2.5 (3) MG/3ML Soln Commonly known as: DUONEB Take 3 mLs by nebulization every 4 (four) hours as needed.   metoprolol succinate 25 MG 24 hr tablet  Commonly known as: TOPROL-XL Take 1 tablet (25 mg total) by mouth daily.   mirabegron ER 25 MG Tb24 tablet Commonly known as: Myrbetriq Take 1 tablet (25 mg total) by mouth daily.   mupirocin ointment 2 % Commonly known as: BACTROBAN PLACE 1 APPLICATION INTO THE NOSE 2 TIMES A DAY   niacin 1000 MG CR tablet Commonly known as: Niaspan Take 1 tablet (1,000 mg total) by mouth at bedtime.   pramipexole 0.5 MG tablet Commonly known as: MIRAPEX TAKE ONE TABLET AT BEDTIME   risperiDONE 3 MG tablet Commonly known as:  RISPERDAL TAKE ONE TABLET AT BEDTIME   simvastatin 20 MG tablet Commonly known as: ZOCOR Take 1 tablet (20 mg total) by mouth at bedtime.   solifenacin 5 MG tablet Commonly known as: VESIcare TAKE (2) TABLETS DAILY   Spacer/Aero-Holding Dorise Bullion 1 each by Does not apply route as needed.        Objective:   BP 107/62   Pulse 74   Temp 98 F (36.7 C) (Temporal)   Ht 6' (1.829 m)   Wt 138 lb 3.2 oz (62.7 kg)   SpO2 99%   BMI 18.74 kg/m   Wt Readings from Last 3 Encounters:  08/09/19 138 lb 3.2 oz (62.7 kg)  07/09/19 143 lb 4.8 oz (65 kg)  06/12/19 143 lb (64.9 kg)    Physical Exam Vitals and nursing note reviewed.  Constitutional:      General: He is not in acute distress.    Appearance: He is well-developed. He is not diaphoretic.  Eyes:     General: No scleral icterus.    Conjunctiva/sclera: Conjunctivae normal.  Neck:     Thyroid: No thyromegaly.  Cardiovascular:     Rate and Rhythm: Normal rate and regular rhythm.     Heart sounds: Normal heart sounds. No murmur.  Pulmonary:     Effort: Pulmonary effort is normal. No respiratory distress.     Breath sounds: Normal breath sounds. No wheezing.  Musculoskeletal:     Cervical back: Neck supple.  Lymphadenopathy:     Cervical: No cervical adenopathy.  Neurological:     Mental Status: He is alert and oriented to person, place, and time.     Coordination: Coordination normal.  Psychiatric:        Behavior: Behavior normal.       Assessment & Plan:   Problem List Items Addressed This Visit      Cardiovascular and Mediastinum   Essential hypertension - Primary   Relevant Orders   CBC with Differential/Platelet   CMP14+EGFR   Lipid panel     Other   Hyperlipidemia   Relevant Orders   CBC with Differential/Platelet   CMP14+EGFR   Lipid panel   Generalized anxiety disorder   Relevant Medications   ALPRAZolam (XANAX) 1 MG tablet   pramipexole (MIRAPEX) 0.5 MG tablet   risperiDONE  (RISPERDAL) 4 MG tablet   Melatonin 10 MG TABS   Other Relevant Orders   CBC with Differential/Platelet   CMP14+EGFR   Lipid panel   TSH   Depression, recurrent (HCC)   Relevant Medications   ALPRAZolam (XANAX) 1 MG tablet   pramipexole (MIRAPEX) 0.5 MG tablet   risperiDONE (RISPERDAL) 4 MG tablet   Melatonin 10 MG TABS   Other Relevant Orders   CBC with Differential/Platelet   CMP14+EGFR   Lipid panel   TSH   Mild tetrahydrocannabinol (THC) abuse    Other Visit Diagnoses    Colon cancer  screening       Relevant Orders   Cologuard   Insomnia, unspecified type       Relevant Medications   Melatonin 10 MG TABS      Patient positive for marijuana on drug screen, will taper off alprazolam  We will increase his risperidone to see if it helps some more with sleep.  Also added melatonin Follow up plan: Return in about 3 months (around 11/06/2019), or if symptoms worsen or fail to improve, for Recheck hypertension anxiety.  Counseling provided for all of the vaccine components No orders of the defined types were placed in this encounter.   Caryl Pina, MD [facility] 08/09/2019, 1:26 PM

## 2019-08-21 ENCOUNTER — Other Ambulatory Visit: Payer: Self-pay

## 2019-08-21 ENCOUNTER — Ambulatory Visit (INDEPENDENT_AMBULATORY_CARE_PROVIDER_SITE_OTHER): Payer: Medicare Other

## 2019-08-21 DIAGNOSIS — F329 Major depressive disorder, single episode, unspecified: Secondary | ICD-10-CM

## 2019-08-21 DIAGNOSIS — R32 Unspecified urinary incontinence: Secondary | ICD-10-CM | POA: Diagnosis not present

## 2019-08-21 DIAGNOSIS — Z7982 Long term (current) use of aspirin: Secondary | ICD-10-CM | POA: Diagnosis not present

## 2019-08-21 DIAGNOSIS — Z8673 Personal history of transient ischemic attack (TIA), and cerebral infarction without residual deficits: Secondary | ICD-10-CM | POA: Diagnosis not present

## 2019-08-21 DIAGNOSIS — Z7951 Long term (current) use of inhaled steroids: Secondary | ICD-10-CM

## 2019-08-25 ENCOUNTER — Other Ambulatory Visit: Payer: Self-pay | Admitting: Family Medicine

## 2019-08-31 LAB — COLOGUARD: Cologuard: NEGATIVE

## 2019-09-09 ENCOUNTER — Other Ambulatory Visit: Payer: Self-pay | Admitting: Family Medicine

## 2019-09-09 DIAGNOSIS — F411 Generalized anxiety disorder: Secondary | ICD-10-CM

## 2019-09-09 DIAGNOSIS — F339 Major depressive disorder, recurrent, unspecified: Secondary | ICD-10-CM

## 2019-10-06 ENCOUNTER — Telehealth: Payer: Self-pay | Admitting: *Deleted

## 2019-10-06 NOTE — Telephone Encounter (Signed)
Left message for [name] to please call our office.

## 2019-10-06 NOTE — Telephone Encounter (Signed)
TC w/ tina w/ CAPS program, pt's BP was elevated from his normal today 146/76, normal in the 112s/70s, he is not sleeping and agitated not being on his Alprazolam after tapering off of it d/t his rapid drug screen in the ED after Christmas. Pt and sister state that he was around the grandkids that were smoking marijuana during christmas. Can he be put on 0.5 mg Alprazolam, and come do a Toxasure

## 2019-10-06 NOTE — Telephone Encounter (Signed)
Patient has also had some falls and stumbles and trips while he was on the alprazolam so the drug screen was not the only reason to taper him off, no I do not agree with him going back on it.  If they would so prefer something for his agitation then they can have buspirone 10 mg 3 times daily as needed and send him 90 with 2 refills, if they want to discuss anything else in the future they can come in for a visit.

## 2019-10-13 MED ORDER — BUSPIRONE HCL 10 MG PO TABS: 10.0000 mg | ORAL_TABLET | Freq: Three times a day (TID) | ORAL | 0 refills | Status: DC

## 2019-10-13 NOTE — Addendum Note (Signed)
Addended by: Julious Payer D on: 10/13/2019 04:47 PM   Modules accepted: Orders

## 2019-10-13 NOTE — Telephone Encounter (Signed)
Tina aware of Dr. Darrol Poke recommendations agrees with the Buspirone. Sent Rx in to pharmacy

## 2019-10-25 ENCOUNTER — Other Ambulatory Visit: Payer: Self-pay | Admitting: Family Medicine

## 2019-10-25 DIAGNOSIS — F411 Generalized anxiety disorder: Secondary | ICD-10-CM

## 2019-11-04 ENCOUNTER — Other Ambulatory Visit: Payer: Self-pay | Admitting: Family Medicine

## 2019-11-07 ENCOUNTER — Ambulatory Visit (INDEPENDENT_AMBULATORY_CARE_PROVIDER_SITE_OTHER): Payer: Medicare Other | Admitting: Family Medicine

## 2019-11-07 ENCOUNTER — Ambulatory Visit (INDEPENDENT_AMBULATORY_CARE_PROVIDER_SITE_OTHER): Payer: Medicare Other

## 2019-11-07 ENCOUNTER — Other Ambulatory Visit: Payer: Self-pay

## 2019-11-07 ENCOUNTER — Encounter: Payer: Self-pay | Admitting: Family Medicine

## 2019-11-07 VITALS — BP 104/57 | HR 90 | Temp 98.2°F | Ht 72.0 in | Wt 137.4 lb

## 2019-11-07 DIAGNOSIS — I10 Essential (primary) hypertension: Secondary | ICD-10-CM

## 2019-11-07 DIAGNOSIS — E782 Mixed hyperlipidemia: Secondary | ICD-10-CM

## 2019-11-07 DIAGNOSIS — F411 Generalized anxiety disorder: Secondary | ICD-10-CM | POA: Diagnosis not present

## 2019-11-07 DIAGNOSIS — J449 Chronic obstructive pulmonary disease, unspecified: Secondary | ICD-10-CM

## 2019-11-07 DIAGNOSIS — K2100 Gastro-esophageal reflux disease with esophagitis, without bleeding: Secondary | ICD-10-CM

## 2019-11-07 MED ORDER — PAROXETINE HCL 20 MG PO TABS: 20.0000 mg | ORAL_TABLET | Freq: Every day | ORAL | 3 refills | Status: DC

## 2019-11-07 NOTE — Progress Notes (Signed)
BP (!) 104/57   Pulse 90   Temp 98.2 F (36.8 C) (Temporal)   Ht 6' (1.829 m)   Wt 137 lb 6 oz (62.3 kg)   BMI 18.63 kg/m    Subjective:   Patient ID: [name], male    DOB: 1956-06-04, 64 y.o.   MRN: [id number]  HPI: [name] is a 64 y.o. male presenting on 11/07/2019 for Medical Management of Chronic Issues   HPI Patient is coming in complaining of more congestion and breathing issues gradually happen over the past few months, his sister is the primary historian.  She says that he has been losing weight and having some more difficulty with breathing at times and she is concerned about that.  He is also been more anxious at times.  He does smoke both marijuana and cigarettes.  Patient was using buspirone and replacement for his anxiety and says it is not helping at all per daughter and will try to switch to another agent.  Hyperlipidemia Patient is coming in for recheck of his hyperlipidemia. The patient is currently taking simvastatin. They deny any issues with myalgias or history of liver damage from it. They deny any focal numbness or weakness or chest pain.   GERD Patient is currently on Nexium.  She denies any major symptoms or abdominal pain or belching or burping. She denies any blood in her stool or lightheadedness or dizziness.   Hypertension Patient is currently on metoprolol, and their blood pressure today is 104/57. Patient denies any lightheadedness or dizziness. Patient denies headaches, blurred vision, chest pains, shortness of breath, or weakness. Denies any side effects from medication and is content with current medication.   COPD Patient is coming in for COPD recheck today.  He is currently on DuoNeb and Ventolin.  He has a mild chronic cough but denies any major coughing spells or wheezing spells.  He has 4nighttime symptoms per week and 5daytime symptoms per week currently.  He just has wheezing most of the time but denies wanting an  maintenance inhaler because he cannot afford.  Relevant past medical, surgical, family and social history reviewed and updated as indicated. Interim medical history since our last visit reviewed. Allergies and medications reviewed and updated.  Review of Systems  Constitutional: Positive for appetite change and unexpected weight change. Negative for chills and fever.  Respiratory: Positive for cough and wheezing. Negative for shortness of breath.   Cardiovascular: Negative for chest pain and leg swelling.  Musculoskeletal: Negative for back pain and gait problem.  Skin: Negative for rash.  Neurological: Negative for dizziness, weakness and light-headedness.  Psychiatric/Behavioral: Positive for dysphoric mood and sleep disturbance. The patient is nervous/anxious.   All other systems reviewed and are negative.   Per HPI unless specifically indicated above   Allergies as of 11/07/2019      Reactions   Clonidine    REACTION: rash      Medication List       Accurate as of Nov 07, 2019  1:43 PM. If you have any questions, ask your nurse or doctor.        STOP taking these medications   ALPRAZolam 1 MG tablet Commonly known as: Duanne Moron Stopped by: Worthy Rancher, MD   busPIRone 10 MG tablet Commonly known as: BUSPAR Stopped by: Worthy Rancher, MD   fluticasone 50 MCG/ACT nasal spray Commonly known as: FLONASE Stopped by: Worthy Rancher, MD     TAKE these medications  albuterol 108 (90 Base) MCG/ACT inhaler Commonly known as: VENTOLIN HFA 2 PUFFS EVERY 6 HOURS AS NEEDED FOR WHEEZING OR SHORTNESS OF BREATH   aspirin 81 MG chewable tablet Chew 81 mg by mouth daily.   esomeprazole 40 MG capsule Commonly known as: NEXIUM TAKE (1) CAPSULE DAILY   ipratropium-albuterol 0.5-2.5 (3) MG/3ML Soln Commonly known as: DUONEB Take 3 mLs by nebulization every 4 (four) hours as needed.   Melatonin 10 MG Tabs Take 10 mg by mouth at bedtime.   metoprolol succinate 25  MG 24 hr tablet Commonly known as: TOPROL-XL Take 1 tablet (25 mg total) by mouth daily.   mirabegron ER 25 MG Tb24 tablet Commonly known as: Myrbetriq Take 1 tablet (25 mg total) by mouth daily.   mupirocin ointment 2 % Commonly known as: BACTROBAN PLACE 1 APPLICATION INTO THE NOSE 2 TIMES A DAY   niacin 1000 MG CR tablet Commonly known as: Niaspan Take 1 tablet (1,000 mg total) by mouth at bedtime.   PARoxetine 20 MG tablet Commonly known as: Paxil Take 1 tablet (20 mg total) by mouth daily. Started by: Worthy Rancher, MD   pramipexole 0.5 MG tablet Commonly known as: MIRAPEX Take 1 tablet (0.5 mg total) by mouth at bedtime.   risperidone 4 MG tablet Commonly known as: RISPERDAL Take 1 tablet (4 mg total) by mouth at bedtime.   simvastatin 20 MG tablet Commonly known as: ZOCOR Take 1 tablet (20 mg total) by mouth at bedtime.   solifenacin 5 MG tablet Commonly known as: VESIcare TAKE (2) TABLETS DAILY   Spacer/Aero-Holding Dorise Bullion 1 each by Does not apply route as needed.        Objective:   BP (!) 104/57   Pulse 90   Temp 98.2 F (36.8 C) (Temporal)   Ht 6' (1.829 m)   Wt 137 lb 6 oz (62.3 kg)   BMI 18.63 kg/m   Wt Readings from Last 3 Encounters:  11/07/19 137 lb 6 oz (62.3 kg)  08/09/19 138 lb 3.2 oz (62.7 kg)  07/09/19 143 lb 4.8 oz (65 kg)    Physical Exam Vitals and nursing note reviewed.  Constitutional:      General: He is not in acute distress.    Appearance: He is well-developed. He is not diaphoretic.  Eyes:     General: No scleral icterus.    Conjunctiva/sclera: Conjunctivae normal.  Neck:     Thyroid: No thyromegaly.  Cardiovascular:     Rate and Rhythm: Normal rate and regular rhythm.     Heart sounds: Normal heart sounds. No murmur.  Pulmonary:     Effort: Pulmonary effort is normal. No respiratory distress.     Breath sounds: Wheezing (Coarse breath sounds throughout) and rhonchi present.  Musculoskeletal:         General: Normal range of motion.     Cervical back: Neck supple.  Lymphadenopathy:     Cervical: No cervical adenopathy.  Skin:    General: Skin is warm and dry.     Findings: No rash.  Neurological:     Mental Status: He is alert and oriented to person, place, and time.     Coordination: Coordination normal.  Psychiatric:        Behavior: Behavior normal.     Chest x-ray: COPD changes in the lungs, no signs of acute cardiopulmonary abnormality, await final read from radiology.  Assessment & Plan:   Problem List Items Addressed This Visit  Cardiovascular and Mediastinum   Essential hypertension   Relevant Orders   CMP14+EGFR     Respiratory   COPD (chronic obstructive pulmonary disease) ([place])   Relevant Orders   DG Chest 2 View     Digestive   GERD   Relevant Orders   CBC with Differential/Platelet     Other   Hyperlipidemia   Relevant Orders   Lipid panel   Generalized anxiety disorder - Primary   Relevant Medications   PARoxetine (PAXIL) 20 MG tablet      Patient did not do well on buspirone, will try Paxil, was on alprazolam but because of failed drug screen had to stop it.  Follow up plan: Return in about 3 months (around 02/07/2020), or if symptoms worsen or fail to improve, for Anxiety and GERD.  Counseling provided for all of the vaccine components Orders Placed This Encounter  Procedures  . DG Chest 2 View  . CBC with Differential/Platelet  . CMP14+EGFR  . Lipid panel    Caryl Pina, MD [facility] 11/07/2019, 1:43 PM

## 2019-11-08 LAB — CMP14+EGFR
ALT: 12 IU/L (ref 0–44)
AST: 16 IU/L (ref 0–40)
Albumin/Globulin Ratio: 1.9 (ref 1.2–2.2)
Albumin: 4.3 g/dL (ref 3.8–4.8)
Alkaline Phosphatase: 93 IU/L (ref 48–121)
BUN/Creatinine Ratio: 27 — ABNORMAL HIGH (ref 10–24)
BUN: 20 mg/dL (ref 8–27)
Bilirubin Total: 0.4 mg/dL (ref 0.0–1.2)
CO2: 21 mmol/L (ref 20–29)
Calcium: 9.4 mg/dL (ref 8.6–10.2)
Chloride: 105 mmol/L (ref 96–106)
Creatinine, Ser: 0.73 mg/dL — ABNORMAL LOW (ref 0.76–1.27)
GFR calc Af Amer: 113 mL/min/{1.73_m2} (ref >59)
GFR calc non Af Amer: 97 mL/min/{1.73_m2} (ref >59)
Globulin, Total: 2.3 g/dL (ref 1.5–4.5)
Glucose: 99 mg/dL (ref 65–99)
Potassium: 3.8 mmol/L (ref 3.5–5.2)
Sodium: 141 mmol/L (ref 134–144)
Total Protein: 6.6 g/dL (ref 6.0–8.5)

## 2019-11-08 LAB — CBC WITH DIFFERENTIAL/PLATELET
Basophils Absolute: 0.1 10*3/uL (ref 0.0–0.2)
Basos: 1 %
EOS (ABSOLUTE): 0.1 10*3/uL (ref 0.0–0.4)
Eos: 1 %
Hematocrit: 37.2 % — ABNORMAL LOW (ref 37.5–51.0)
Hemoglobin: 12.5 g/dL — ABNORMAL LOW (ref 13.0–17.7)
Immature Grans (Abs): 0 10*3/uL (ref 0.0–0.1)
Immature Granulocytes: 0 %
Lymphocytes Absolute: 2.1 10*3/uL (ref 0.7–3.1)
Lymphs: 22 %
MCH: 33 pg (ref 26.6–33.0)
MCHC: 33.6 g/dL (ref 31.5–35.7)
MCV: 98 fL — ABNORMAL HIGH (ref 79–97)
Monocytes Absolute: 0.7 10*3/uL (ref 0.1–0.9)
Monocytes: 7 %
Neutrophils Absolute: 6.6 10*3/uL (ref 1.4–7.0)
Neutrophils: 69 %
Platelets: 231 10*3/uL (ref 150–450)
RBC: 3.79 x10E6/uL — ABNORMAL LOW (ref 4.14–5.80)
RDW: 12.1 % (ref 11.6–15.4)
WBC: 9.6 10*3/uL (ref 3.4–10.8)

## 2019-11-08 LAB — LIPID PANEL
Chol/HDL Ratio: 2.2 ratio (ref 0.0–5.0)
Cholesterol, Total: 126 mg/dL (ref 100–199)
HDL: 57 mg/dL (ref >39)
LDL Chol Calc (NIH): 57 mg/dL (ref 0–99)
Triglycerides: 55 mg/dL (ref 0–149)
VLDL Cholesterol Cal: 12 mg/dL (ref 5–40)

## 2019-11-18 ENCOUNTER — Other Ambulatory Visit: Payer: Self-pay | Admitting: Family Medicine

## 2019-11-18 DIAGNOSIS — E782 Mixed hyperlipidemia: Secondary | ICD-10-CM

## 2019-11-18 DIAGNOSIS — I10 Essential (primary) hypertension: Secondary | ICD-10-CM

## 2019-11-29 ENCOUNTER — Inpatient Hospital Stay (HOSPITAL_COMMUNITY)
Admission: EM | Admit: 2019-11-29 | Discharge: 2019-12-01 | DRG: 935 | Disposition: A | Discharge status: Home / Self Care | Payer: Medicare Other | Attending: Family Medicine | Admitting: Family Medicine

## 2019-11-29 ENCOUNTER — Encounter (HOSPITAL_COMMUNITY): Payer: Self-pay

## 2019-11-29 ENCOUNTER — Emergency Department (HOSPITAL_COMMUNITY): Payer: Medicare Other

## 2019-11-29 ENCOUNTER — Other Ambulatory Visit: Payer: Self-pay

## 2019-11-29 DIAGNOSIS — Z87820 Personal history of traumatic brain injury: Secondary | ICD-10-CM | POA: Diagnosis not present

## 2019-11-29 DIAGNOSIS — R531 Weakness: Secondary | ICD-10-CM | POA: Diagnosis present

## 2019-11-29 DIAGNOSIS — F411 Generalized anxiety disorder: Secondary | ICD-10-CM | POA: Diagnosis present

## 2019-11-29 DIAGNOSIS — G40909 Epilepsy, unspecified, not intractable, without status epilepticus: Secondary | ICD-10-CM | POA: Diagnosis present

## 2019-11-29 DIAGNOSIS — L03114 Cellulitis of left upper limb: Secondary | ICD-10-CM | POA: Diagnosis present

## 2019-11-29 DIAGNOSIS — G4733 Obstructive sleep apnea (adult) (pediatric): Secondary | ICD-10-CM | POA: Diagnosis present

## 2019-11-29 DIAGNOSIS — Z888 Allergy status to other drugs, medicaments and biological substances status: Secondary | ICD-10-CM | POA: Diagnosis not present

## 2019-11-29 DIAGNOSIS — J439 Emphysema, unspecified: Secondary | ICD-10-CM | POA: Diagnosis not present

## 2019-11-29 DIAGNOSIS — H9193 Unspecified hearing loss, bilateral: Secondary | ICD-10-CM | POA: Diagnosis present

## 2019-11-29 DIAGNOSIS — I2510 Atherosclerotic heart disease of native coronary artery without angina pectoris: Secondary | ICD-10-CM | POA: Diagnosis present

## 2019-11-29 DIAGNOSIS — G2581 Restless legs syndrome: Secondary | ICD-10-CM | POA: Diagnosis present

## 2019-11-29 DIAGNOSIS — F17210 Nicotine dependence, cigarettes, uncomplicated: Secondary | ICD-10-CM | POA: Diagnosis present

## 2019-11-29 DIAGNOSIS — T23212A Burn of second degree of left thumb (nail), initial encounter: Principal | ICD-10-CM | POA: Diagnosis present

## 2019-11-29 DIAGNOSIS — Z7982 Long term (current) use of aspirin: Secondary | ICD-10-CM

## 2019-11-29 DIAGNOSIS — F329 Major depressive disorder, single episode, unspecified: Secondary | ICD-10-CM | POA: Diagnosis present

## 2019-11-29 DIAGNOSIS — F1210 Cannabis abuse, uncomplicated: Secondary | ICD-10-CM | POA: Diagnosis present

## 2019-11-29 DIAGNOSIS — I10 Essential (primary) hypertension: Secondary | ICD-10-CM | POA: Diagnosis present

## 2019-11-29 DIAGNOSIS — Z20822 Contact with and (suspected) exposure to covid-19: Secondary | ICD-10-CM | POA: Diagnosis present

## 2019-11-29 DIAGNOSIS — X58XXXA Exposure to other specified factors, initial encounter: Secondary | ICD-10-CM | POA: Diagnosis present

## 2019-11-29 DIAGNOSIS — K219 Gastro-esophageal reflux disease without esophagitis: Secondary | ICD-10-CM | POA: Diagnosis present

## 2019-11-29 DIAGNOSIS — Z716 Tobacco abuse counseling: Secondary | ICD-10-CM

## 2019-11-29 DIAGNOSIS — E785 Hyperlipidemia, unspecified: Secondary | ICD-10-CM | POA: Diagnosis present

## 2019-11-29 DIAGNOSIS — L03012 Cellulitis of left finger: Secondary | ICD-10-CM | POA: Diagnosis present

## 2019-11-29 DIAGNOSIS — F339 Major depressive disorder, recurrent, unspecified: Secondary | ICD-10-CM

## 2019-11-29 DIAGNOSIS — J449 Chronic obstructive pulmonary disease, unspecified: Secondary | ICD-10-CM | POA: Diagnosis present

## 2019-11-29 DIAGNOSIS — Z79899 Other long term (current) drug therapy: Secondary | ICD-10-CM | POA: Diagnosis not present

## 2019-11-29 DIAGNOSIS — F1010 Alcohol abuse, uncomplicated: Secondary | ICD-10-CM | POA: Diagnosis present

## 2019-11-29 DIAGNOSIS — Z8249 Family history of ischemic heart disease and other diseases of the circulatory system: Secondary | ICD-10-CM | POA: Diagnosis not present

## 2019-11-29 LAB — URINALYSIS, ROUTINE W REFLEX MICROSCOPIC
Bacteria, UA: NONE SEEN
Bilirubin Urine: NEGATIVE
Glucose, UA: NEGATIVE mg/dL
Ketones, ur: 20 mg/dL — AB
Leukocytes,Ua: NEGATIVE
Nitrite: NEGATIVE
Protein, ur: NEGATIVE mg/dL
Specific Gravity, Urine: 1.026 (ref 1.005–1.030)
pH: 6 (ref 5.0–8.0)

## 2019-11-29 LAB — CBC WITH DIFFERENTIAL/PLATELET
Abs Immature Granulocytes: 0.04 10*3/uL (ref 0.00–0.07)
Basophils Absolute: 0.1 10*3/uL (ref 0.0–0.1)
Basophils Relative: 1 %
Eosinophils Absolute: 0 10*3/uL (ref 0.0–0.5)
Eosinophils Relative: 0 %
HCT: 38.1 % — ABNORMAL LOW (ref 39.0–52.0)
Hemoglobin: 12.4 g/dL — ABNORMAL LOW (ref 13.0–17.0)
Immature Granulocytes: 0 %
Lymphocytes Relative: 13 %
Lymphs Abs: 1.4 10*3/uL (ref 0.7–4.0)
MCH: 33 pg (ref 26.0–34.0)
MCHC: 32.5 g/dL (ref 30.0–36.0)
MCV: 101.3 fL — ABNORMAL HIGH (ref 80.0–100.0)
Monocytes Absolute: 0.9 10*3/uL (ref 0.1–1.0)
Monocytes Relative: 8 %
Neutro Abs: 8.5 10*3/uL — ABNORMAL HIGH (ref 1.7–7.7)
Neutrophils Relative %: 78 %
Platelets: 199 10*3/uL (ref 150–400)
RBC: 3.76 MIL/uL — ABNORMAL LOW (ref 4.22–5.81)
RDW: 12.7 % (ref 11.5–15.5)
WBC: 10.9 10*3/uL — ABNORMAL HIGH (ref 4.0–10.5)
nRBC: 0 % (ref 0.0–0.2)

## 2019-11-29 LAB — BASIC METABOLIC PANEL
Anion gap: 10 (ref 5–15)
BUN: 22 mg/dL (ref 8–23)
CO2: 27 mmol/L (ref 22–32)
Calcium: 9.2 mg/dL (ref 8.9–10.3)
Chloride: 105 mmol/L (ref 98–111)
Creatinine, Ser: 0.77 mg/dL (ref 0.61–1.24)
GFR calc Af Amer: >60 mL/min (ref >60)
GFR calc non Af Amer: >60 mL/min (ref >60)
Glucose, Bld: 121 mg/dL — ABNORMAL HIGH (ref 70–99)
Potassium: 3.5 mmol/L (ref 3.5–5.1)
Sodium: 142 mmol/L (ref 135–145)

## 2019-11-29 LAB — SARS CORONAVIRUS 2 BY RT PCR (HOSPITAL ORDER, PERFORMED IN ~~LOC~~ HOSPITAL LAB): SARS Coronavirus 2: NEGATIVE

## 2019-11-29 LAB — LACTIC ACID, PLASMA: Lactic Acid, Venous: 1.2 mmol/L (ref 0.5–1.9)

## 2019-11-29 MED ORDER — IPRATROPIUM-ALBUTEROL 0.5-2.5 (3) MG/3ML IN SOLN: 3.0000 mL | RESPIRATORY_TRACT | Status: DC | PRN

## 2019-11-29 MED ORDER — LORAZEPAM 1 MG PO TABS: 1.0000 mg | ORAL_TABLET | ORAL | Status: DC | PRN

## 2019-11-29 MED ORDER — RISPERIDONE 1 MG PO TABS
4.0000 mg | ORAL_TABLET | Freq: Every day | ORAL | Status: DC
  Administered 2019-11-29 – 2019-11-30 (×2): 4 mg via ORAL
  Filled 2019-11-29 (×2): qty 4

## 2019-11-29 MED ORDER — FOLIC ACID 1 MG PO TABS
1.0000 mg | ORAL_TABLET | Freq: Every day | ORAL | Status: DC
  Administered 2019-11-30 – 2019-12-01 (×2): 1 mg via ORAL
  Filled 2019-11-29 (×2): qty 1

## 2019-11-29 MED ORDER — ALBUTEROL SULFATE HFA 108 (90 BASE) MCG/ACT IN AERS: 2.0000 | INHALATION_SPRAY | Freq: Four times a day (QID) | RESPIRATORY_TRACT | Status: DC | PRN

## 2019-11-29 MED ORDER — LORAZEPAM 2 MG/ML IJ SOLN: 1.0000 mg | INTRAMUSCULAR | Status: DC | PRN

## 2019-11-29 MED ORDER — SODIUM CHLORIDE 0.9 % IV SOLN
2.0000 g | INTRAVENOUS | Status: DC
  Administered 2019-11-29 – 2019-11-30 (×2): 2 g via INTRAVENOUS
  Filled 2019-11-29 (×2): qty 20

## 2019-11-29 MED ORDER — THIAMINE HCL 100 MG PO TABS
100.0000 mg | ORAL_TABLET | Freq: Every day | ORAL | Status: DC
  Administered 2019-11-30 – 2019-12-01 (×2): 100 mg via ORAL
  Filled 2019-11-29 (×2): qty 1

## 2019-11-29 MED ORDER — POLYETHYLENE GLYCOL 3350 17 G PO PACK: 17.0000 g | PACK | Freq: Every day | ORAL | Status: DC | PRN

## 2019-11-29 MED ORDER — PAROXETINE HCL 20 MG PO TABS
20.0000 mg | ORAL_TABLET | Freq: Every day | ORAL | Status: DC
  Administered 2019-11-30 – 2019-12-01 (×2): 20 mg via ORAL
  Filled 2019-11-29 (×2): qty 1

## 2019-11-29 MED ORDER — ACETAMINOPHEN 325 MG PO TABS: 650.0000 mg | ORAL_TABLET | Freq: Four times a day (QID) | ORAL | Status: DC | PRN

## 2019-11-29 MED ORDER — ADULT MULTIVITAMIN W/MINERALS CH
1.0000 | ORAL_TABLET | Freq: Every day | ORAL | Status: DC
  Administered 2019-11-30 – 2019-12-01 (×2): 1 via ORAL
  Filled 2019-11-29 (×2): qty 1

## 2019-11-29 MED ORDER — ONDANSETRON HCL 4 MG PO TABS: 4.0000 mg | ORAL_TABLET | Freq: Four times a day (QID) | ORAL | Status: DC | PRN

## 2019-11-29 MED ORDER — MIRABEGRON ER 25 MG PO TB24
25.0000 mg | ORAL_TABLET | Freq: Every day | ORAL | Status: DC
  Administered 2019-11-30 – 2019-12-01 (×2): 25 mg via ORAL
  Filled 2019-11-29 (×2): qty 1

## 2019-11-29 MED ORDER — SIMVASTATIN 20 MG PO TABS
20.0000 mg | ORAL_TABLET | Freq: Every day | ORAL | Status: DC
  Administered 2019-11-29 – 2019-11-30 (×2): 20 mg via ORAL
  Filled 2019-11-29 (×2): qty 1

## 2019-11-29 MED ORDER — SODIUM CHLORIDE 0.9 % IV BOLUS
500.0000 mL | Freq: Once | INTRAVENOUS | Status: AC
  Administered 2019-11-29: 500 mL via INTRAVENOUS

## 2019-11-29 MED ORDER — HEPARIN SODIUM (PORCINE) 5000 UNIT/ML IJ SOLN
5000.0000 [IU] | Freq: Three times a day (TID) | INTRAMUSCULAR | Status: DC
  Administered 2019-11-29 – 2019-12-01 (×5): 5000 [IU] via SUBCUTANEOUS
  Filled 2019-11-29 (×5): qty 1

## 2019-11-29 MED ORDER — ACETAMINOPHEN 650 MG RE SUPP: 650.0000 mg | Freq: Four times a day (QID) | RECTAL | Status: DC | PRN

## 2019-11-29 MED ORDER — PANTOPRAZOLE SODIUM 40 MG PO TBEC
40.0000 mg | DELAYED_RELEASE_TABLET | Freq: Every day | ORAL | Status: DC
  Administered 2019-11-30 – 2019-12-01 (×2): 40 mg via ORAL
  Filled 2019-11-29 (×2): qty 1

## 2019-11-29 MED ORDER — THIAMINE HCL 100 MG/ML IJ SOLN: 100.0000 mg | Freq: Every day | INTRAMUSCULAR | Status: DC

## 2019-11-29 MED ORDER — ONDANSETRON HCL 4 MG/2ML IJ SOLN: 4.0000 mg | Freq: Four times a day (QID) | INTRAMUSCULAR | Status: DC | PRN

## 2019-11-29 MED ORDER — PRAMIPEXOLE DIHYDROCHLORIDE 1 MG PO TABS
0.5000 mg | ORAL_TABLET | Freq: Every day | ORAL | Status: DC
  Administered 2019-11-29 – 2019-11-30 (×2): 0.5 mg via ORAL
  Filled 2019-11-29 (×2): qty 1

## 2019-11-29 MED ORDER — METOPROLOL SUCCINATE ER 25 MG PO TB24
25.0000 mg | ORAL_TABLET | Freq: Every day | ORAL | Status: DC
  Administered 2019-11-30 – 2019-12-01 (×2): 25 mg via ORAL
  Filled 2019-11-29 (×2): qty 1

## 2019-11-29 MED ORDER — VANCOMYCIN HCL 1500 MG/300ML IV SOLN
1500.0000 mg | Freq: Once | INTRAVENOUS | Status: AC
  Administered 2019-11-29: 1500 mg via INTRAVENOUS
  Filled 2019-11-29: qty 300
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
## 2019-11-29 NOTE — ED Notes (Signed)
Patient requested urinal.  Still no urine - advised patient we needed urine specimen.

## 2019-11-29 NOTE — Plan of Care (Signed)
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
## 2019-11-30 DIAGNOSIS — I10 Essential (primary) hypertension: Secondary | ICD-10-CM

## 2019-11-30 DIAGNOSIS — J439 Emphysema, unspecified: Secondary | ICD-10-CM

## 2019-11-30 LAB — CBC
HCT: 38.9 % — ABNORMAL LOW (ref 39.0–52.0)
Hemoglobin: 12.8 g/dL — ABNORMAL LOW (ref 13.0–17.0)
MCH: 32.8 pg (ref 26.0–34.0)
MCHC: 32.9 g/dL (ref 30.0–36.0)
MCV: 99.7 fL (ref 80.0–100.0)
Platelets: 213 10*3/uL (ref 150–400)
RBC: 3.9 MIL/uL — ABNORMAL LOW (ref 4.22–5.81)
RDW: 12.2 % (ref 11.5–15.5)
WBC: 7.5 10*3/uL (ref 4.0–10.5)
nRBC: 0 % (ref 0.0–0.2)

## 2019-11-30 LAB — HIV ANTIBODY (ROUTINE TESTING W REFLEX): HIV Screen 4th Generation wRfx: NONREACTIVE
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
## 2019-11-30 NOTE — Plan of Care (Signed)

## 2019-12-01 DIAGNOSIS — G2581 Restless legs syndrome: Secondary | ICD-10-CM

## 2019-12-01 DIAGNOSIS — F411 Generalized anxiety disorder: Secondary | ICD-10-CM

## 2019-12-01 MED ORDER — PRAMIPEXOLE DIHYDROCHLORIDE 0.5 MG PO TABS: 1.0000 mg | ORAL_TABLET | Freq: Every day | ORAL | 1 refills | Status: DC

## 2019-12-01 MED ORDER — ADULT MULTIVITAMIN W/MINERALS CH: 1.0000 | ORAL_TABLET | Freq: Every day | ORAL | 1 refills | Status: DC

## 2019-12-01 MED ORDER — CEPHALEXIN 500 MG PO CAPS: 500.0000 mg | ORAL_CAPSULE | Freq: Three times a day (TID) | ORAL | 0 refills | Status: DC

## 2019-12-01 MED ORDER — DOXYCYCLINE HYCLATE 100 MG PO TABS: 100.0000 mg | ORAL_TABLET | Freq: Once | ORAL | Status: DC

## 2019-12-01 MED ORDER — ASPIRIN EC 81 MG PO TBEC
81.0000 mg | DELAYED_RELEASE_TABLET | Freq: Every day | ORAL | 2 refills | Status: DC
Start: 2019-12-01 — End: 2020-02-05

## 2019-12-01 MED ORDER — THIAMINE HCL 100 MG PO TABS: 100.0000 mg | ORAL_TABLET | Freq: Every day | ORAL | 2 refills | Status: AC

## 2019-12-01 MED ORDER — ACETAMINOPHEN 325 MG PO TABS: 650.0000 mg | ORAL_TABLET | Freq: Four times a day (QID) | ORAL | 0 refills | Status: DC | PRN

## 2019-12-01 MED ORDER — DOXYCYCLINE HYCLATE 100 MG PO TABS: 100.0000 mg | ORAL_TABLET | Freq: Two times a day (BID) | ORAL | 0 refills | Status: DC

## 2019-12-01 MED ORDER — FOLIC ACID 1 MG PO TABS: 1.0000 mg | ORAL_TABLET | Freq: Every day | ORAL | 2 refills | Status: DC

## 2019-12-01 NOTE — Plan of Care (Signed)
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
## 2019-12-01 NOTE — Care Management Important Message (Signed)
Important Message  Patient Details  Name: [name] MRN: [id number] Date of Birth: 1956-06-02   Medicare Important Message Given:  Yes  Late entry, letter given at 1:00pm   Corey Harold 12/01/2019, 4:27 PM

## 2019-12-01 NOTE — Discharge Instructions (Signed)
---   Take antibiotics as prescribed for wound infection  --Follow-up with primary care physician within a week for wound recheck and reevaluation

## 2019-12-03 ENCOUNTER — Inpatient Hospital Stay (HOSPITAL_COMMUNITY)
Admission: EM | Admit: 2019-12-03 | Discharge: 2019-12-06 | DRG: 948 | Disposition: A | Discharge status: Skilled Nursing Facility | Payer: Medicare Other | Attending: Internal Medicine | Admitting: Internal Medicine

## 2019-12-03 ENCOUNTER — Emergency Department (HOSPITAL_COMMUNITY): Payer: Medicare Other

## 2019-12-03 ENCOUNTER — Encounter (HOSPITAL_COMMUNITY): Payer: Self-pay

## 2019-12-03 ENCOUNTER — Other Ambulatory Visit: Payer: Self-pay

## 2019-12-03 DIAGNOSIS — D539 Nutritional anemia, unspecified: Secondary | ICD-10-CM | POA: Diagnosis present

## 2019-12-03 DIAGNOSIS — R29810 Facial weakness: Secondary | ICD-10-CM | POA: Diagnosis present

## 2019-12-03 DIAGNOSIS — G459 Transient cerebral ischemic attack, unspecified: Secondary | ICD-10-CM | POA: Diagnosis not present

## 2019-12-03 DIAGNOSIS — Z20822 Contact with and (suspected) exposure to covid-19: Secondary | ICD-10-CM | POA: Diagnosis present

## 2019-12-03 DIAGNOSIS — Z888 Allergy status to other drugs, medicaments and biological substances status: Secondary | ICD-10-CM

## 2019-12-03 DIAGNOSIS — I2510 Atherosclerotic heart disease of native coronary artery without angina pectoris: Secondary | ICD-10-CM | POA: Diagnosis present

## 2019-12-03 DIAGNOSIS — I10 Essential (primary) hypertension: Secondary | ICD-10-CM | POA: Diagnosis present

## 2019-12-03 DIAGNOSIS — L03012 Cellulitis of left finger: Secondary | ICD-10-CM | POA: Diagnosis present

## 2019-12-03 DIAGNOSIS — F0390 Unspecified dementia without behavioral disturbance: Secondary | ICD-10-CM | POA: Diagnosis present

## 2019-12-03 DIAGNOSIS — D500 Iron deficiency anemia secondary to blood loss (chronic): Secondary | ICD-10-CM | POA: Diagnosis present

## 2019-12-03 DIAGNOSIS — R270 Ataxia, unspecified: Secondary | ICD-10-CM

## 2019-12-03 DIAGNOSIS — R531 Weakness: Principal | ICD-10-CM

## 2019-12-03 DIAGNOSIS — N319 Neuromuscular dysfunction of bladder, unspecified: Secondary | ICD-10-CM | POA: Diagnosis present

## 2019-12-03 DIAGNOSIS — E876 Hypokalemia: Secondary | ICD-10-CM | POA: Diagnosis present

## 2019-12-03 DIAGNOSIS — I671 Cerebral aneurysm, nonruptured: Secondary | ICD-10-CM | POA: Diagnosis present

## 2019-12-03 DIAGNOSIS — G4733 Obstructive sleep apnea (adult) (pediatric): Secondary | ICD-10-CM | POA: Diagnosis present

## 2019-12-03 DIAGNOSIS — F419 Anxiety disorder, unspecified: Secondary | ICD-10-CM | POA: Diagnosis present

## 2019-12-03 DIAGNOSIS — E785 Hyperlipidemia, unspecified: Secondary | ICD-10-CM | POA: Diagnosis present

## 2019-12-03 DIAGNOSIS — J449 Chronic obstructive pulmonary disease, unspecified: Secondary | ICD-10-CM | POA: Diagnosis present

## 2019-12-03 DIAGNOSIS — E860 Dehydration: Secondary | ICD-10-CM | POA: Diagnosis present

## 2019-12-03 DIAGNOSIS — F329 Major depressive disorder, single episode, unspecified: Secondary | ICD-10-CM | POA: Diagnosis present

## 2019-12-03 DIAGNOSIS — Z79899 Other long term (current) drug therapy: Secondary | ICD-10-CM

## 2019-12-03 DIAGNOSIS — F411 Generalized anxiety disorder: Secondary | ICD-10-CM | POA: Diagnosis present

## 2019-12-03 DIAGNOSIS — Z7982 Long term (current) use of aspirin: Secondary | ICD-10-CM

## 2019-12-03 DIAGNOSIS — K219 Gastro-esophageal reflux disease without esophagitis: Secondary | ICD-10-CM | POA: Diagnosis present

## 2019-12-03 DIAGNOSIS — R4781 Slurred speech: Secondary | ICD-10-CM | POA: Diagnosis present

## 2019-12-03 DIAGNOSIS — G2581 Restless legs syndrome: Secondary | ICD-10-CM | POA: Diagnosis present

## 2019-12-03 DIAGNOSIS — G40909 Epilepsy, unspecified, not intractable, without status epilepticus: Secondary | ICD-10-CM | POA: Diagnosis present

## 2019-12-03 DIAGNOSIS — F17210 Nicotine dependence, cigarettes, uncomplicated: Secondary | ICD-10-CM | POA: Diagnosis present

## 2019-12-03 LAB — URINALYSIS, ROUTINE W REFLEX MICROSCOPIC
Bacteria, UA: NONE SEEN
Bilirubin Urine: NEGATIVE
Glucose, UA: NEGATIVE mg/dL
Ketones, ur: 5 mg/dL — AB
Leukocytes,Ua: NEGATIVE
Nitrite: NEGATIVE
Protein, ur: NEGATIVE mg/dL
Specific Gravity, Urine: 1.004 — ABNORMAL LOW (ref 1.005–1.030)
pH: 7 (ref 5.0–8.0)

## 2019-12-03 LAB — CBC WITH DIFFERENTIAL/PLATELET
Abs Immature Granulocytes: 0.02 10*3/uL (ref 0.00–0.07)
Basophils Absolute: 0.1 10*3/uL (ref 0.0–0.1)
Basophils Relative: 1 %
Eosinophils Absolute: 0 10*3/uL (ref 0.0–0.5)
Eosinophils Relative: 0 %
HCT: 32.3 % — ABNORMAL LOW (ref 39.0–52.0)
Hemoglobin: 10.7 g/dL — ABNORMAL LOW (ref 13.0–17.0)
Immature Granulocytes: 0 %
Lymphocytes Relative: 18 %
Lymphs Abs: 1.6 10*3/uL (ref 0.7–4.0)
MCH: 33.4 pg (ref 26.0–34.0)
MCHC: 33.1 g/dL (ref 30.0–36.0)
MCV: 100.9 fL — ABNORMAL HIGH (ref 80.0–100.0)
Monocytes Absolute: 0.8 10*3/uL (ref 0.1–1.0)
Monocytes Relative: 9 %
Neutro Abs: 6.4 10*3/uL (ref 1.7–7.7)
Neutrophils Relative %: 72 %
Platelets: 207 10*3/uL (ref 150–400)
RBC: 3.2 MIL/uL — ABNORMAL LOW (ref 4.22–5.81)
RDW: 12.6 % (ref 11.5–15.5)
WBC: 8.9 10*3/uL (ref 4.0–10.5)
nRBC: 0 % (ref 0.0–0.2)

## 2019-12-03 LAB — HEPATIC FUNCTION PANEL
ALT: 16 U/L (ref 0–44)
AST: 34 U/L (ref 15–41)
Albumin: 3.5 g/dL (ref 3.5–5.0)
Alkaline Phosphatase: 60 U/L (ref 38–126)
Bilirubin, Direct: 0.1 mg/dL (ref 0.0–0.2)
Indirect Bilirubin: 0.7 mg/dL (ref 0.3–0.9)
Total Bilirubin: 0.8 mg/dL (ref 0.3–1.2)
Total Protein: 6.3 g/dL — ABNORMAL LOW (ref 6.5–8.1)

## 2019-12-03 LAB — BASIC METABOLIC PANEL
Anion gap: 12 (ref 5–15)
BUN: 16 mg/dL (ref 8–23)
CO2: 24 mmol/L (ref 22–32)
Calcium: 8.7 mg/dL — ABNORMAL LOW (ref 8.9–10.3)
Chloride: 101 mmol/L (ref 98–111)
Creatinine, Ser: 0.68 mg/dL (ref 0.61–1.24)
GFR calc Af Amer: >60 mL/min (ref >60)
GFR calc non Af Amer: >60 mL/min (ref >60)
Glucose, Bld: 100 mg/dL — ABNORMAL HIGH (ref 70–99)
Potassium: 3.2 mmol/L — ABNORMAL LOW (ref 3.5–5.1)
Sodium: 137 mmol/L (ref 135–145)

## 2019-12-03 LAB — SARS CORONAVIRUS 2 BY RT PCR (HOSPITAL ORDER, PERFORMED IN ~~LOC~~ HOSPITAL LAB): SARS Coronavirus 2: NEGATIVE

## 2019-12-03 LAB — CBG MONITORING, ED: Glucose-Capillary: 98 mg/dL (ref 70–99)

## 2019-12-03 LAB — LACTIC ACID, PLASMA: Lactic Acid, Venous: 1.3 mmol/L (ref 0.5–1.9)

## 2019-12-03 LAB — POC OCCULT BLOOD, ED: Fecal Occult Bld: NEGATIVE

## 2019-12-03 MED ORDER — PRAMIPEXOLE DIHYDROCHLORIDE 1 MG PO TABS
1.0000 mg | ORAL_TABLET | Freq: Every day | ORAL | Status: DC
  Administered 2019-12-04 – 2019-12-05 (×3): 1 mg via ORAL
  Filled 2019-12-03 (×3): qty 1

## 2019-12-03 MED ORDER — PANTOPRAZOLE SODIUM 40 MG PO TBEC
40.0000 mg | DELAYED_RELEASE_TABLET | Freq: Every day | ORAL | Status: DC
  Administered 2019-12-04 – 2019-12-06 (×4): 40 mg via ORAL
  Filled 2019-12-03 (×4): qty 1

## 2019-12-03 MED ORDER — SODIUM CHLORIDE 0.9 % IV BOLUS
1000.0000 mL | Freq: Once | INTRAVENOUS | Status: AC
  Administered 2019-12-03: 1000 mL via INTRAVENOUS

## 2019-12-03 MED ORDER — ADULT MULTIVITAMIN W/MINERALS CH
1.0000 | ORAL_TABLET | Freq: Every day | ORAL | Status: DC
  Administered 2019-12-04 – 2019-12-06 (×3): 1 via ORAL
  Filled 2019-12-03 (×3): qty 1

## 2019-12-03 MED ORDER — CEPHALEXIN 500 MG PO CAPS
500.0000 mg | ORAL_CAPSULE | Freq: Three times a day (TID) | ORAL | Status: DC
  Administered 2019-12-03 – 2019-12-06 (×9): 500 mg via ORAL
  Filled 2019-12-03 (×9): qty 1

## 2019-12-03 MED ORDER — IPRATROPIUM-ALBUTEROL 0.5-2.5 (3) MG/3ML IN SOLN: 3.0000 mL | RESPIRATORY_TRACT | Status: DC | PRN

## 2019-12-03 MED ORDER — ONDANSETRON HCL 4 MG PO TABS: 4.0000 mg | ORAL_TABLET | Freq: Four times a day (QID) | ORAL | Status: DC | PRN

## 2019-12-03 MED ORDER — POTASSIUM CHLORIDE CRYS ER 20 MEQ PO TBCR
40.0000 meq | EXTENDED_RELEASE_TABLET | Freq: Once | ORAL | Status: AC
  Administered 2019-12-03: 40 meq via ORAL
  Filled 2019-12-03: qty 2

## 2019-12-03 MED ORDER — STROKE: EARLY STAGES OF RECOVERY BOOK
Freq: Once | Status: AC
  Filled 2019-12-03: qty 1

## 2019-12-03 MED ORDER — BUSPIRONE HCL 5 MG PO TABS
10.0000 mg | ORAL_TABLET | Freq: Three times a day (TID) | ORAL | Status: DC
  Administered 2019-12-03 – 2019-12-06 (×9): 10 mg via ORAL
  Filled 2019-12-03 (×9): qty 2

## 2019-12-03 MED ORDER — ACETAMINOPHEN 650 MG RE SUPP: 650.0000 mg | Freq: Four times a day (QID) | RECTAL | Status: DC | PRN

## 2019-12-03 MED ORDER — POTASSIUM CHLORIDE IN NACL 20-0.9 MEQ/L-% IV SOLN
INTRAVENOUS | Status: DC
  Filled 2019-12-03: qty 1000

## 2019-12-03 MED ORDER — MIRABEGRON ER 25 MG PO TB24
25.0000 mg | ORAL_TABLET | Freq: Every day | ORAL | Status: DC
  Administered 2019-12-04 – 2019-12-06 (×3): 25 mg via ORAL
  Filled 2019-12-03 (×3): qty 1

## 2019-12-03 MED ORDER — MELATONIN 3 MG PO TABS
9.0000 mg | ORAL_TABLET | Freq: Every day | ORAL | Status: DC
  Administered 2019-12-04 – 2019-12-05 (×2): 9 mg via ORAL
  Filled 2019-12-03 (×2): qty 3

## 2019-12-03 MED ORDER — POTASSIUM CHLORIDE ER 10 MEQ PO TBCR
10.0000 meq | EXTENDED_RELEASE_TABLET | Freq: Every day | ORAL | 0 refills | Status: DC
Start: 2019-12-03 — End: 2021-11-08

## 2019-12-03 MED ORDER — PAROXETINE HCL 20 MG PO TABS
20.0000 mg | ORAL_TABLET | Freq: Every day | ORAL | Status: DC
  Administered 2019-12-04 – 2019-12-06 (×3): 20 mg via ORAL
  Filled 2019-12-03 (×3): qty 1

## 2019-12-03 MED ORDER — LORAZEPAM 2 MG/ML IJ SOLN: 0.5000 mg | Freq: Once | INTRAMUSCULAR | Status: DC | PRN

## 2019-12-03 MED ORDER — NIACIN ER 250 MG PO CPCR
1000.0000 mg | ORAL_CAPSULE | Freq: Every day | ORAL | Status: DC
  Administered 2019-12-04 – 2019-12-05 (×2): 1000 mg via ORAL
  Filled 2019-12-03 (×5): qty 4

## 2019-12-03 MED ORDER — SODIUM CHLORIDE 0.9% FLUSH: 3.0000 mL | Freq: Once | INTRAVENOUS | Status: DC

## 2019-12-03 MED ORDER — ASPIRIN EC 81 MG PO TBEC
81.0000 mg | DELAYED_RELEASE_TABLET | Freq: Every day | ORAL | Status: DC
  Administered 2019-12-04 – 2019-12-06 (×3): 81 mg via ORAL
  Filled 2019-12-03 (×3): qty 1

## 2019-12-03 MED ORDER — SIMVASTATIN 20 MG PO TABS
20.0000 mg | ORAL_TABLET | Freq: Every day | ORAL | Status: DC
  Administered 2019-12-03 – 2019-12-05 (×3): 20 mg via ORAL
  Filled 2019-12-03: qty 2
  Filled 2019-12-03 (×2): qty 1

## 2019-12-03 MED ORDER — ACETAMINOPHEN 325 MG PO TABS: 650.0000 mg | ORAL_TABLET | Freq: Four times a day (QID) | ORAL | Status: DC | PRN

## 2019-12-03 MED ORDER — FOLIC ACID 1 MG PO TABS
1.0000 mg | ORAL_TABLET | Freq: Every day | ORAL | Status: DC
  Administered 2019-12-04 – 2019-12-06 (×3): 1 mg via ORAL
  Filled 2019-12-03 (×3): qty 1

## 2019-12-03 MED ORDER — THIAMINE HCL 100 MG PO TABS
100.0000 mg | ORAL_TABLET | Freq: Every day | ORAL | Status: DC
  Administered 2019-12-04 – 2019-12-06 (×3): 100 mg via ORAL
  Filled 2019-12-03 (×3): qty 1

## 2019-12-03 MED ORDER — DOXYCYCLINE HYCLATE 100 MG PO TABS
100.0000 mg | ORAL_TABLET | Freq: Two times a day (BID) | ORAL | Status: DC
  Administered 2019-12-03 – 2019-12-06 (×6): 100 mg via ORAL
  Filled 2019-12-03 (×6): qty 1

## 2019-12-03 MED ORDER — ONDANSETRON HCL 4 MG/2ML IJ SOLN: 4.0000 mg | Freq: Four times a day (QID) | INTRAMUSCULAR | Status: DC | PRN

## 2019-12-03 MED ORDER — RISPERIDONE 1 MG PO TABS
4.0000 mg | ORAL_TABLET | Freq: Every day | ORAL | Status: DC
  Administered 2019-12-03 – 2019-12-05 (×3): 4 mg via ORAL
  Filled 2019-12-03 (×3): qty 4

## 2019-12-03 MED ORDER — MUPIROCIN 2 % EX OINT
1.0000 "application " | TOPICAL_OINTMENT | Freq: Two times a day (BID) | CUTANEOUS | Status: DC
  Administered 2019-12-03 – 2019-12-06 (×6): 1 via TOPICAL
  Filled 2019-12-03 (×3): qty 22
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
## 2019-12-03 NOTE — H&P (Signed)
History and Physical    Patrick GrayerCharlie P Perin ZOX:[id number] DOB: Nov 17, 1955 DOA: 12/03/2019  PCP: Dettinger, Elige RadonJoshua A, MD   Patient coming from: Home.  I have personally briefly reviewed patient's [facility]  Chief Complaint: Weakness.  HPI: [name] is a 64 y.o. male with medical history significant of anxiety, depression, alcohol abuse, tobacco abuse, history of closed head injury, COPD, GERD, hyperlipidemia, hypertension, seizure disorder, sleep apnea who was recently discharged from the hospital due to cellulitis of the left thumb and is returning to the emergency department today after becoming very weak at home to the point that he is unable to assist with transfers from bed to chair or to go to the restroom.  He is oriented x2, answers simple questions, but is unable to contribute to significantly to HPI.  His sister states that she is the only 1 with him at home.  She states that he has become so weak that her assistance is not enough to perform transfers safely.  She also reports that he has being at times increasingly confused, has some slurred speech earlier in the day and may be a facial droop.  To her knowledge, he has not had a fever, dyspnea, chest pain, abdominal pain, nausea, vomiting, diarrhea or constipation.  No changes in appetite and sleep.  ED Course: Initial vital signs temperature 98.6 F, pulse 91, respiration 18, blood pressure 98/61 mmHg and O2 sat 92% on room air.  He received 40 mEq of KCl p.o. along with 1000 mL of NS bolus.  Urinalysis show a low a specific gravity of 1.004, small hemoglobinuria and ketonuria 5 mg/dL.  Fecal occult blood in stool was negative.  Lactic acid was normal.  SARS coronavirus 2 PCR was negative.  CBC showed a white count of 8.9 with 72% neutrophils, 18% lymphocytes and 9% monocytes.  Hemoglobin was 10.7 g/dL and platelets [id number].  Chemistry showed a potassium of 3.2 mmol/L, otherwise normal electrolytes  when calcium is corrected to albumin.  Renal function was normal.  LFTs showed a total protein of 6.3 g/dL, but were otherwise unremarkable.  EKG NSR with minimal voltage criteria for LVH and nonspecific ST abnormality.  Imaging: Chest radiograph no acute cardiopulmonary disease.  Shows chronic interstitial lung disease.  CT lumbar spine did not show any acute abnormality within the lumbar spine. There were multiple DJD changes.  CT head no acute intracranial hemorrhage.  There is age-related atrophy and chronic microvascular ischemic changes.  There is a small bilateral thalamic old lacunar infarct.  There is a 1 cm hypodense focus in the left lentiform nucleus which is age-indeterminate, but appears to be new since prior CT.  MRI recommended.  Please see images and full radiology report for further detail.  Review of Systems: As per HPI otherwise all other systems reviewed and are negative.  Past Medical History:  Diagnosis Date  . Anxiety   . Cerebral aneurysm   . Closed head injury   . COPD (chronic obstructive pulmonary disease) (HCC)   . Depression   . GERD (gastroesophageal reflux disease)   . Hyperlipidemia   . Hypertension   . Seizures (HCC)   . Sleep apnea    History reviewed. No pertinent surgical history.  Social History  reports that he has been smoking. He has a 50.00 pack-year smoking history. He has never used smokeless tobacco. He reports current drug use. Drug: Marijuana. He reports that he does not drink alcohol.  Allergies  Allergen Reactions  . Clonidine Rash   Family History  Problem Relation Age of Onset  . Obesity Sister    Prior to Admission medications   Medication Sig Start Date End Date Taking? Authorizing Provider  acetaminophen (TYLENOL) 325 MG tablet Take 2 tablets (650 mg total) by mouth every 6 (six) hours as needed for mild pain (or Fever >/= 101). 12/01/19  Yes Emokpae, Courage, MD  albuterol (VENTOLIN HFA) 108 (90 Base) MCG/ACT inhaler 2 PUFFS  EVERY 6 HOURS AS NEEDED FOR WHEEZING OR SHORTNESS OF BREATH Patient taking differently: Inhale 2 puffs into the lungs every 6 (six) hours as needed for wheezing or shortness of breath.  11/20/19  Yes Dettinger, Fransisca Kaufmann, MD  aspirin EC 81 MG tablet Take 1 tablet (81 mg total) by mouth daily with breakfast. 12/01/19 11/30/20 Yes Emokpae, Courage, MD  busPIRone (BUSPAR) 10 MG tablet Take 10 mg by mouth 3 (three) times daily.   Yes [provider]  cephALEXin (KEFLEX) 500 MG capsule Take 1 capsule (500 mg total) by mouth 3 (three) times daily for 7 days. 12/01/19 12/08/19 Yes Emokpae, Courage, MD  doxycycline (VIBRA-TABS) 100 MG tablet Take 1 tablet (100 mg total) by mouth 2 (two) times daily for 7 days. 12/01/19 12/08/19 Yes Emokpae, Courage, MD  esomeprazole (NEXIUM) 40 MG capsule TAKE (1) CAPSULE DAILY Patient taking differently: Take 40 mg by mouth daily. TAKE (1) CAPSULE DAILY 11/20/19  Yes Dettinger, Fransisca Kaufmann, MD  folic acid (FOLVITE) 1 MG tablet Take 1 tablet (1 mg total) by mouth daily. 12/02/19  Yes Emokpae, Courage, MD  ipratropium-albuterol (DUONEB) 0.5-2.5 (3) MG/3ML SOLN Take 3 mLs by nebulization every 4 (four) hours as needed. Patient taking differently: Take 3 mLs by nebulization every 4 (four) hours as needed (for shortness of breath/wheezing).  11/02/18 12/03/19 Yes Dettinger, Fransisca Kaufmann, MD  Melatonin 10 MG TABS Take 10 mg by mouth at bedtime. 08/09/19  Yes Dettinger, Fransisca Kaufmann, MD  metoprolol succinate (TOPROL-XL) 25 MG 24 hr tablet Take 1 tablet (25 mg total) by mouth daily. 11/20/19  Yes Dettinger, Fransisca Kaufmann, MD  mirabegron ER (MYRBETRIQ) 25 MG TB24 tablet Take 1 tablet (25 mg total) by mouth daily. 04/05/19  Yes Dettinger, Fransisca Kaufmann, MD  Multiple Vitamin (MULTIVITAMIN WITH MINERALS) TABS tablet Take 1 tablet by mouth daily. 12/02/19  Yes Emokpae, Courage, MD  niacin (NIASPAN) 1000 MG CR tablet TAKE 1 TABLET AT BEDTIME Patient taking differently: Take 1,000 mg by mouth at bedtime.  11/20/19  Yes  Dettinger, Fransisca Kaufmann, MD  PARoxetine (PAXIL) 20 MG tablet Take 1 tablet (20 mg total) by mouth daily. 11/07/19  Yes Dettinger, Fransisca Kaufmann, MD  pramipexole (MIRAPEX) 0.5 MG tablet Take 2 tablets (1 mg total) by mouth at bedtime. 12/01/19  Yes Emokpae, Courage, MD  risperiDONE (RISPERDAL) 4 MG tablet Take 1 tablet (4 mg total) by mouth at bedtime. 08/09/19  Yes Dettinger, Fransisca Kaufmann, MD  simvastatin (ZOCOR) 20 MG tablet Take 1 tablet (20 mg total) by mouth at bedtime. 11/02/18  Yes Dettinger, Fransisca Kaufmann, MD  thiamine 100 MG tablet Take 1 tablet (100 mg total) by mouth daily. 12/02/19  Yes Emokpae, Courage, MD  mupirocin ointment (BACTROBAN) 2 % PLACE 1 APPLICATION INTO THE NOSE 2 TIMES A DAY Patient taking differently: Apply 1 application topically 2 (two) times daily.  06/12/19   Baruch Gouty, FNP  potassium chloride (KLOR-CON) 10 MEQ tablet Take 1 tablet (10 mEq total) by mouth daily. 12/03/19  Bethann Berkshire, MD   Physical Exam: Vitals:   12/03/19 1900 12/03/19 1930 12/03/19 2000 12/03/19 2030  BP: (!) 125/59 122/60 (!) 115/57 (!) 122/57  Pulse: 93 97 79 88  Resp: 16 18 20 16   Temp:      TempSrc:      SpO2: 98% 99% 100% 100%  Weight:      Height:       Constitutional: Looks chronically ill, but in NAD, calm, comfortable Eyes: PERRL, lids and conjunctivae normal ENMT: Mucous membranes are moist. Posterior pharynx clear of any exudate or lesions. Neck: normal, supple, no masses, no thyromegaly Respiratory: clear to auscultation bilaterally, no wheezing, no crackles. Normal respiratory effort. No accessory muscle use.  Cardiovascular: Regular rate and rhythm, no murmurs / rubs / gallops. No extremity edema. 2+ pedal pulses. No carotid bruits.  Abdomen: Nondistended.  BS positive.  Soft, no tenderness, no masses palpated. No hepatosplenomegaly. Musculoskeletal: Generalized, nonfocal weakness.  No clubbing / cyanosis. Good ROM, no contractures. Normal muscle tone.  Skin: Resolving left arm  cellulitis.  Otherwise, no new rashes, lesions, ulcers on very limited dermatological examination Neurologic: CN 2-12 grossly intact. Sensation intact, DTR normal.  Generalized weakness. Psychiatric: Alert and oriented x 2, disoriented to time, date and situation.  Follows simple commands.  Labs on Admission: I have personally reviewed following labs and imaging studies  CBC: Recent Labs  Lab 11/29/19 1132 11/30/19 0913 12/03/19 1546  WBC 10.9* 7.5 8.9  NEUTROABS 8.5*  --  6.4  HGB 12.4* 12.8* 10.7*  HCT 38.1* 38.9* 32.3*  MCV 101.3* 99.7 100.9*  PLT 199 213 207   Basic Metabolic Panel: Recent Labs  Lab 11/29/19 1132 12/03/19 1546  NA 142 137  K 3.5 3.2*  CL 105 101  CO2 27 24  GLUCOSE 121* 100*  BUN 22 16  CREATININE 0.77 0.68  CALCIUM 9.2 8.7*   GFR: Estimated Creatinine Clearance: 76.8 mL/min (by C-G formula based on SCr of 0.68 mg/dL).  Liver Function Tests: Recent Labs  Lab 12/03/19 1546  AST 34  ALT 16  ALKPHOS 60  BILITOT 0.8  PROT 6.3*  ALBUMIN 3.5   Urine analysis:    Component Value Date/Time   COLORURINE STRAW (A) 12/03/2019 1932   APPEARANCEUR CLEAR 12/03/2019 1932   APPEARANCEUR Clear 11/12/2017 1144   LABSPEC 1.004 (L) 12/03/2019 1932   PHURINE 7.0 12/03/2019 1932   GLUCOSEU NEGATIVE 12/03/2019 1932   HGBUR SMALL (A) 12/03/2019 1932   BILIRUBINUR NEGATIVE 12/03/2019 1932   BILIRUBINUR Negative 11/12/2017 1144   KETONESUR 5 (A) 12/03/2019 1932   PROTEINUR NEGATIVE 12/03/2019 1932   UROBILINOGEN negative 11/04/2012 1302   NITRITE NEGATIVE 12/03/2019 1932   LEUKOCYTESUR NEGATIVE 12/03/2019 1932   Radiological Exams on Admission: CT Head Wo Contrast  Result Date: 12/03/2019 CLINICAL DATA:  64 year old male with ataxia. EXAM: CT HEAD WITHOUT CONTRAST TECHNIQUE: Contiguous axial images were obtained from the base of the skull through the vertex without intravenous contrast. COMPARISON:  Head CT dated 07/09/2019. FINDINGS: Brain: Moderate  age-related atrophy and chronic microvascular ischemic changes. 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 A 1 cm hypodense focus in the left lentiform nucleus is age indeterminate. There is no acute intracranial hemorrhage. No mass effect or midline shift. No extra-axial fluid collection. Vascular: No hyperdense vessel or unexpected calcification. Skull: No acute calvarial pathology. Left parietal and left frontal calvarial burr holes. Sinuses/Orbits: Diffuse mucoperiosteal thickening of paranasal sinuses with partial opacification of the left maxillary sinus. The mastoid air  cells are clear. Other: None IMPRESSION: 1. No acute intracranial hemorrhage. 2. Age-related atrophy and chronic microvascular ischemic changes. 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 3. A 1 cm hypodense focus in the left lentiform nucleus is age indeterminate but new since the prior CT. MRI may provide better evaluation if clinically indicated. Electronically Signed   By: Elgie Collard M.D.   On: 12/03/2019 19:59   CT Lumbar Spine Wo Contrast  Result Date: 12/03/2019 CLINICAL DATA:  Initial evaluation for low back pain for greater than 6 weeks. EXAM: CT LUMBAR SPINE WITHOUT CONTRAST TECHNIQUE: Multidetector CT imaging of the lumbar spine was performed without intravenous contrast administration. Multiplanar CT image reconstructions were also generated. COMPARISON:  None available. FINDINGS: Segmentation: Transitional lumbosacral anatomy with a partially sacralized L5 vertebral body. Lowest well-formed disc space labeled L5-S1. Alignment: Trace retrolisthesis of L2 on L3, chronic and facet mediated. Alignment otherwise normal with preservation of the normal lumbar lordosis. Vertebrae: Vertebral body height well maintained without evidence for acute or chronic fracture. Visualized sacrum and pelvis intact. SI joints approximated and symmetric. No worrisome lytic or blastic osseous lesions. Paraspinal and other soft tissues:  Paraspinous soft tissues demonstrate no acute finding. Extensive aorto bi-iliac atherosclerotic disease noted. No aneurysm. Remainder the visualized visceral structures otherwise unremarkable. Disc levels: T12-L1: Normal interspace. Mild bilateral facet hypertrophy. No canal or foraminal stenosis. L1-2: Mild annular disc bulge. Superimposed left foraminal to extraforaminal disc protrusion (series 4, image 41). Finding closely approximates and could potentially affect the exiting left L1 nerve root. Mild facet and ligament flavum hypertrophy. No significant spinal stenosis. Foramina remain patent. L2-3: Trace retrolisthesis. Diffuse disc bulge, asymmetric to the right. Superimposed broad-based right foraminal to extraforaminal disc protrusion (series 4, image 57). Superimposed moderate facet and ligament flavum hypertrophy. No more than mild spinal stenosis. Moderate right worse than left L2 foraminal narrowing. L3-4: Mild diffuse disc bulge. Moderate facet and ligament flavum hypertrophy. Probable mild narrowing of the lateral recesses bilaterally. Moderate bilateral L3 foraminal stenosis. L4-5: Diffuse disc bulge, eccentric to the right. Right-sided reactive endplate changes with marginal endplate spurring. Superimposed small right paracentral to foraminal disc protrusion with slight superior angulation (series 4, image 87). Severe right worse than left facet hypertrophy. Resultant moderate right with mild left lateral recess stenosis and foraminal narrowing. L5-S1: Transitional lumbosacral anatomy with partially sacralized L5 vertebral body. Mild intervertebral disc space narrowing without significant disc bulge. Moderate bilateral facet hypertrophy. No canal or foraminal stenosis. IMPRESSION: 1. No acute abnormality within the lumbar spine. 2. Multifactorial degenerative changes at L4-5 with resultant moderate right lateral recess and foraminal stenosis. Either the right L4 or descending L5 nerve roots could be  affected. 3. Left foraminal to extraforaminal disc protrusion at L1-2, potentially affecting the exiting left L1 nerve root. 4. Moderate bilateral L2 and L3 foraminal stenosis based on disc bulging and facet hypertrophy. 5. Aortic Atherosclerosis (ICD10-I70.0). Electronically Signed   By: Rise Mu M.D.   On: 12/03/2019 20:24   DG Chest Port 1 View  Result Date: 12/03/2019 CLINICAL DATA:  Cough.  Weakness.  Diffuse pain. EXAM: PORTABLE CHEST 1 VIEW COMPARISON:  11/07/2019 and 07/09/2019 FINDINGS: The heart size and pulmonary vascularity are normal. No infiltrates or effusions. Chronic slight accentuation of the interstitial markings bilaterally. No acute bone abnormality. IMPRESSION: No acute cardiopulmonary disease. Chronic interstitial lung disease. Electronically Signed   By: Francene Boyers M.D.   On: 12/03/2019 16:41   EKG: Independently reviewed. Vent. rate 84 BPM PR interval 146 ms  QRS duration 92 ms QT/QTc 384/453 ms P-R-T axes 65 69 25 Normal sinus rhythm Minimal voltage criteria for LVH  may be normal variant ( Sokolow-Lyon ) Nonspecific ST abnormality Abnormal ECG  Assessment/Plan Principal Problem:   Ataxia Unable to walk or help with transfers. Undetermined age lesion on CT head. Observation/telemetry. Frequent neuro checks. Swallow screen. Consult PT/OT/TOC. Check fasting lipids. Check hemoglobin A1c. Check carotid Doppler. Check echocardiogram. Continue aspirin. Check MRI in a.m. Will likely need placement.  Active Problems:   Hypokalemia Replacing. Check magnesium level. Follow-up potassium level.    Hyperlipidemia Continue simvastatin 20 mg p.o. daily.    Generalized anxiety disorder Continue paroxetine 20 mg p.o. daily. Continue buspirone 10 mg p.o. 3 times daily. Continue risperidone 4 mg p.o. bedtime.    Essential hypertension Continue metoprolol 25 mg p.o. daily. Monitor blood pressure and heart rate.    COPD (chronic obstructive  pulmonary disease) (HCC) Smoking cessation advised. Supplemental oxygen as needed. Bronchodilators as needed.    GERD Pantoprazole 40 mg p.o. daily.    CAD (coronary artery disease) Continue aspirin 81 mg p.o. daily. Continue metoprolol 25 mg p.o. daily. On simvastatin.    Macrocytic anemia Has dropped 2 g of hemoglobin recently. Fecal occult blood negative in the ED. Check anemia panel in the morning. Monitor hematocrit and hemoglobin.   DVT prophylaxis: SCDs. Code Status:   Full code. Family Communication:  His sister was present in the emergency department. Disposition Plan:   Patient is from:  Home.  Anticipated DC to:  TBD.  Anticipated DC date:  12/03/2019.  Anticipated DC barriers: Clinical improvement    Consults called:  TOC, PT and OT. Admission status:  Observation/telemetry.  Severity of Illness:  Medium to high.  Bobette Mo MD [facility]  How to contact the [facility] Attending or Consulting provider 7A - 7P or covering provider during after hours 7P -7A, for this patient?   1. Check the care team in [facility] and look for a) attending/consulting [facility] provider listed and b) the [facility] team listed 2. Log into [email or web address] and use [place]'s universal password to access. If you do not have the password, please contact the hospital operator. 3. Locate the [facility] provider you are looking for under [facility] and page to a number that you can be directly reached. 4. If you still have difficulty reaching the provider, please page the [facility] (Director on Call) for the Hospitalists listed on amion for assistance.  12/03/2019, 9:18 PM   This document was prepared using Dragon voice recognition software and may contain some unintended transcription errors.

## 2019-12-03 NOTE — Discharge Instructions (Addendum)
Follow-up with your doctor this week for recheck. 

## 2019-12-03 NOTE — ED Provider Notes (Addendum)
[facility] EMERGENCY DEPARTMENT Provider Note   CSN: [id number] Arrival date & time: 12/03/19  1519     History Chief Complaint  Patient presents with  . Weakness    [name] is a 64 y.o. male.  Patient complains of weakness.  He does get out of the hospital and was treated for infection in his thumb.  The history is provided by the patient. No language interpreter was used.  Weakness Severity:  Moderate Onset quality:  Sudden Timing:  Constant Progression:  Waxing and waning Chronicity:  Recurrent Context: not alcohol use   Relieved by:  Nothing Worsened by:  Nothing Ineffective treatments:  None tried Associated symptoms: no abdominal pain, no chest pain, no cough, no diarrhea, no frequency, no headaches and no seizures        Past Medical History:  Diagnosis Date  . Anxiety   . Cerebral aneurysm   . Closed head injury   . COPD (chronic obstructive pulmonary disease) (HCC)   . Depression   . GERD (gastroesophageal reflux disease)   . Hyperlipidemia   . Hypertension   . Seizures (HCC)   . Sleep apnea     Patient Active Problem List   Diagnosis Date Noted  . Cellulitis of left thumb 11/29/2019  . Mild tetrahydrocannabinol (THC) abuse 08/09/2019  . Controlled substance agreement signed 03/14/2018  . DDD (degenerative disc disease), cervical 08/24/2017  . Arthritis 07/29/2017  . Memory loss or impairment 07/08/2016  . Loss of weight 07/07/2013  . Frequent urination 11/04/2012  . Seizure disorder (HCC) 11/04/2012  . RLS (restless legs syndrome) 11/04/2012  . CAD (coronary artery disease) 11/04/2012  . HEAD TRAUMA, CLOSED 04/16/2009  . Hyperlipidemia 06/05/2008  . Generalized anxiety disorder 06/05/2008  . History of alcohol abuse 06/05/2008  . TOBACCO ABUSE 06/05/2008  . Depression, recurrent (HCC) 06/05/2008  . OBSTRUCTIVE SLEEP APNEA 06/05/2008  . Essential hypertension 06/05/2008  . CEREBRAL ANEURYSM 06/05/2008  . COPD (chronic  obstructive pulmonary disease) (HCC) 06/05/2008  . GERD 06/05/2008  . HEADACHE, CHRONIC 06/05/2008    History reviewed. No pertinent surgical history.     No family history on file.  Social History   Tobacco Use  . Smoking status: Current Every Day Smoker    Packs/day: 1.00    Years: 50.00    Pack years: 50.00  . Smokeless tobacco: Never Used  Substance Use Topics  . Alcohol use: No  . Drug use: Yes    Types: Marijuana    Home Medications Prior to Admission medications   Medication Sig Start Date End Date Taking? Authorizing Provider  acetaminophen (TYLENOL) 325 MG tablet Take 2 tablets (650 mg total) by mouth every 6 (six) hours as needed for mild pain (or Fever >/= 101). 12/01/19  Yes Emokpae, Courage, MD  albuterol (VENTOLIN HFA) 108 (90 Base) MCG/ACT inhaler 2 PUFFS EVERY 6 HOURS AS NEEDED FOR WHEEZING OR SHORTNESS OF BREATH Patient taking differently: Inhale 2 puffs into the lungs every 6 (six) hours as needed for wheezing or shortness of breath.  11/20/19  Yes Dettinger, Elige Radon, MD  aspirin EC 81 MG tablet Take 1 tablet (81 mg total) by mouth daily with breakfast. 12/01/19 11/30/20 Yes Emokpae, Courage, MD  busPIRone (BUSPAR) 10 MG tablet Take 10 mg by mouth 3 (three) times daily.   Yes [provider]  cephALEXin (KEFLEX) 500 MG capsule Take 1 capsule (500 mg total) by mouth 3 (three) times daily for 7 days. 12/01/19 12/08/19 Yes Emokpae, Courage,  MD  doxycycline (VIBRA-TABS) 100 MG tablet Take 1 tablet (100 mg total) by mouth 2 (two) times daily for 7 days. 12/01/19 12/08/19 Yes Emokpae, Courage, MD  esomeprazole (NEXIUM) 40 MG capsule TAKE (1) CAPSULE DAILY Patient taking differently: Take 40 mg by mouth daily. TAKE (1) CAPSULE DAILY 11/20/19  Yes Dettinger, Elige Radon, MD  folic acid (FOLVITE) 1 MG tablet Take 1 tablet (1 mg total) by mouth daily. 12/02/19  Yes Emokpae, Courage, MD  ipratropium-albuterol (DUONEB) 0.5-2.5 (3) MG/3ML SOLN Take 3 mLs by nebulization every 4  (four) hours as needed. Patient taking differently: Take 3 mLs by nebulization every 4 (four) hours as needed (for shortness of breath/wheezing).  11/02/18 12/03/19 Yes Dettinger, Elige Radon, MD  Melatonin 10 MG TABS Take 10 mg by mouth at bedtime. 08/09/19  Yes Dettinger, Elige Radon, MD  metoprolol succinate (TOPROL-XL) 25 MG 24 hr tablet Take 1 tablet (25 mg total) by mouth daily. 11/20/19  Yes Dettinger, Elige Radon, MD  mirabegron ER (MYRBETRIQ) 25 MG TB24 tablet Take 1 tablet (25 mg total) by mouth daily. 04/05/19  Yes Dettinger, Elige Radon, MD  Multiple Vitamin (MULTIVITAMIN WITH MINERALS) TABS tablet Take 1 tablet by mouth daily. 12/02/19  Yes Emokpae, Courage, MD  niacin (NIASPAN) 1000 MG CR tablet TAKE 1 TABLET AT BEDTIME Patient taking differently: Take 1,000 mg by mouth at bedtime.  11/20/19  Yes Dettinger, Elige Radon, MD  PARoxetine (PAXIL) 20 MG tablet Take 1 tablet (20 mg total) by mouth daily. 11/07/19  Yes Dettinger, Elige Radon, MD  pramipexole (MIRAPEX) 0.5 MG tablet Take 2 tablets (1 mg total) by mouth at bedtime. 12/01/19  Yes Emokpae, Courage, MD  risperiDONE (RISPERDAL) 4 MG tablet Take 1 tablet (4 mg total) by mouth at bedtime. 08/09/19  Yes Dettinger, Elige Radon, MD  simvastatin (ZOCOR) 20 MG tablet Take 1 tablet (20 mg total) by mouth at bedtime. 11/02/18  Yes Dettinger, Elige Radon, MD  thiamine 100 MG tablet Take 1 tablet (100 mg total) by mouth daily. 12/02/19  Yes Emokpae, Courage, MD  mupirocin ointment (BACTROBAN) 2 % PLACE 1 APPLICATION INTO THE NOSE 2 TIMES A DAY Patient taking differently: Apply 1 application topically 2 (two) times daily.  06/12/19   Sonny Masters, FNP  potassium chloride (KLOR-CON) 10 MEQ tablet Take 1 tablet (10 mEq total) by mouth daily. 12/03/19   Bethann Berkshire, MD    Allergies    Clonidine  Review of Systems   Review of Systems  Constitutional: Negative for appetite change and fatigue.  HENT: Negative for congestion, ear discharge and sinus pressure.   Eyes:  Negative for discharge.  Respiratory: Negative for cough.   Cardiovascular: Negative for chest pain.  Gastrointestinal: Negative for abdominal pain and diarrhea.  Genitourinary: Negative for frequency and hematuria.  Musculoskeletal: Negative for back pain.  Skin: Negative for rash.  Neurological: Positive for weakness. Negative for seizures and headaches.  Psychiatric/Behavioral: Negative for hallucinations.    Physical Exam Updated Vital Signs BP 133/76   Pulse 81   Temp 98.6 F (37 C) (Oral)   Resp (!) 22   Ht 5\' 7"  (1.702 m)   Wt 59 kg   SpO2 100%   BMI 20.37 kg/m   Physical Exam Vitals and nursing note reviewed.  Constitutional:      Appearance: He is well-developed.  HENT:     Head: Normocephalic.     Nose: Nose normal.  Eyes:     General: No scleral icterus.  Conjunctiva/sclera: Conjunctivae normal.  Neck:     Thyroid: No thyromegaly.  Cardiovascular:     Rate and Rhythm: Normal rate and regular rhythm.     Heart sounds: No murmur heard.  No friction rub. No gallop.   Pulmonary:     Breath sounds: No stridor. No wheezing or rales.  Chest:     Chest wall: No tenderness.  Abdominal:     General: There is no distension.     Tenderness: There is no abdominal tenderness. There is no rebound.  Genitourinary:    Comments: Rectal Brown stool heme-negative Musculoskeletal:        General: Normal range of motion.     Cervical back: Neck supple.  Lymphadenopathy:     Cervical: No cervical adenopathy.  Skin:    Findings: No erythema or rash.  Neurological:     Mental Status: He is alert and oriented to person, place, and time.     Motor: No abnormal muscle tone.     Coordination: Coordination normal.  Psychiatric:        Behavior: Behavior normal.     ED Results / Procedures / Treatments   Labs (all labs ordered are listed, but only abnormal results are displayed) Labs Reviewed  BASIC METABOLIC PANEL - Abnormal; Notable for the following components:        Result Value   Potassium 3.2 (*)    Glucose, Bld 100 (*)    Calcium 8.7 (*)    All other components within normal limits  HEPATIC FUNCTION PANEL - Abnormal; Notable for the following components:   Total Protein 6.3 (*)    All other components within normal limits  CBC WITH DIFFERENTIAL/PLATELET - Abnormal; Notable for the following components:   RBC 3.20 (*)    Hemoglobin 10.7 (*)    HCT 32.3 (*)    MCV 100.9 (*)    All other components within normal limits  LACTIC ACID, PLASMA  OCCULT BLOOD X 1 CARD TO LAB, STOOL  CBG MONITORING, ED  POC OCCULT BLOOD, ED    EKG EKG Interpretation  Date/Time:  Sunday December 03 2019 15:41:47 EDT Ventricular Rate:  84 PR Interval:  146 QRS Duration: 92 QT Interval:  384 QTC Calculation: 453 R Axis:   69 Text Interpretation: Normal sinus rhythm Minimal voltage criteria for LVH, may be normal variant ( Sokolow-Lyon ) Nonspecific ST abnormality Abnormal ECG Confirmed by Bethann Berkshire [phone]) on 12/03/2019 4:09:43 PM   Radiology DG Chest Port 1 View  Result Date: 12/03/2019 CLINICAL DATA:  Cough.  Weakness.  Diffuse pain. EXAM: PORTABLE CHEST 1 VIEW COMPARISON:  11/07/2019 and 07/09/2019 FINDINGS: The heart size and pulmonary vascularity are normal. No infiltrates or effusions. Chronic slight accentuation of the interstitial markings bilaterally. No acute bone abnormality. IMPRESSION: No acute cardiopulmonary disease. Chronic interstitial lung disease. Electronically Signed   By: Francene Boyers M.D.   On: 12/03/2019 16:41    Procedures Procedures (including critical care time)  Medications Ordered in ED Medications  sodium chloride flush (NS) 0.9 % injection 3 mL (3 mLs Intravenous Not Given 12/03/19 1539)  potassium chloride SA (KLOR-CON) CR tablet 40 mEq (has no administration in time range)  sodium chloride 0.9 % bolus 1,000 mL (1,000 mLs Intravenous New Bag/Given 12/03/19 1551)    ED Course  I have reviewed the triage vital signs and  the nursing notes.  Pertinent labs & imaging results that were available during my care of the patient were reviewed by me and  considered in my medical decision making (see chart for details). CRITICAL CARE Performed by: Milton Ferguson Total critical care time: 45 minutes Critical care time was exclusive of separately billable procedures and treating other patients. Critical care was necessary to treat or prevent imminent or life-threatening deterioration. Critical care was time spent personally by me on the following activities: development of treatment plan with patient and/or surrogate as well as nursing, discussions with consultants, evaluation of patient's response to treatment, examination of patient, obtaining history from patient or surrogate, ordering and performing treatments and interventions, ordering and review of laboratory studies, ordering and review of radiographic studies, pulse oximetry and re-evaluation of patient's condition.   Patient sister came in told us that the patient's been having multiple falls and she told me a video of him trying to walk.  Patient has significant weakness in both legs but is able to walk but appears to be unsteady.  CT scan of the head shows possible stroke.  He will be admitted to medicine for further work-up MDM Rules/Calculators/A&P                          Patient with dehydration and hypokalemia.  Patient improved with fluids and potassium.  He also has some mild anemia.  Rectal exam is negative.  Patient will be admitted to medicine for multiple falls and weakness in his leg      This patient presents to the ED for concern of weakness this involves an extensive number of treatment options, and is a complaint that carries with it a high risk of complications and morbidity.  The differential diagnosis includes dehydratio,  Sepsis,      Lab Tests:   I Ordered, reviewed, and interpreted labs, which included   Medicines ordered:   I  ordered medication potassium for hypokalemia  Imaging Studies ordered:   I ordered imaging studies which included chest x-ray and  I independently visualized and interpreted imaging which showed no acute disease  Additional history obtained:   Additional history obtained from records  Previous records obtained and reviewed   Consultations Obtained:  Hospitalist has been consulted and will admit patient Reevaluation:  After the interventions stated above, I reevaluated the patient and found improved  Critical Interventions:  .   Final Clinical Impression(s) / ED Diagnoses Final diagnoses:  Hypokalemia  Iron deficiency anemia due to chronic blood loss    Rx / DC Orders ED Discharge Orders         Ordered    potassium chloride (KLOR-CON) 10 MEQ tablet  Daily     Discontinue  Reprint     12/03/19 1833           Milton Ferguson, MD 12/03/19 1846    Milton Ferguson, MD 12/03/19 2101

## 2019-12-04 ENCOUNTER — Observation Stay (HOSPITAL_COMMUNITY): Payer: Medicare Other

## 2019-12-04 DIAGNOSIS — R270 Ataxia, unspecified: Secondary | ICD-10-CM | POA: Diagnosis present

## 2019-12-04 DIAGNOSIS — F17210 Nicotine dependence, cigarettes, uncomplicated: Secondary | ICD-10-CM | POA: Diagnosis present

## 2019-12-04 DIAGNOSIS — I2510 Atherosclerotic heart disease of native coronary artery without angina pectoris: Secondary | ICD-10-CM | POA: Diagnosis present

## 2019-12-04 DIAGNOSIS — Z79899 Other long term (current) drug therapy: Secondary | ICD-10-CM | POA: Diagnosis not present

## 2019-12-04 DIAGNOSIS — J439 Emphysema, unspecified: Secondary | ICD-10-CM | POA: Diagnosis not present

## 2019-12-04 DIAGNOSIS — R4781 Slurred speech: Secondary | ICD-10-CM | POA: Diagnosis present

## 2019-12-04 DIAGNOSIS — J449 Chronic obstructive pulmonary disease, unspecified: Secondary | ICD-10-CM | POA: Diagnosis present

## 2019-12-04 DIAGNOSIS — E876 Hypokalemia: Secondary | ICD-10-CM | POA: Diagnosis present

## 2019-12-04 DIAGNOSIS — R531 Weakness: Secondary | ICD-10-CM | POA: Diagnosis present

## 2019-12-04 DIAGNOSIS — N319 Neuromuscular dysfunction of bladder, unspecified: Secondary | ICD-10-CM | POA: Diagnosis present

## 2019-12-04 DIAGNOSIS — Z888 Allergy status to other drugs, medicaments and biological substances status: Secondary | ICD-10-CM | POA: Diagnosis not present

## 2019-12-04 DIAGNOSIS — F0390 Unspecified dementia without behavioral disturbance: Secondary | ICD-10-CM | POA: Diagnosis present

## 2019-12-04 DIAGNOSIS — R29810 Facial weakness: Secondary | ICD-10-CM | POA: Diagnosis present

## 2019-12-04 DIAGNOSIS — E785 Hyperlipidemia, unspecified: Secondary | ICD-10-CM | POA: Diagnosis present

## 2019-12-04 DIAGNOSIS — Z20822 Contact with and (suspected) exposure to covid-19: Secondary | ICD-10-CM | POA: Diagnosis present

## 2019-12-04 DIAGNOSIS — G40909 Epilepsy, unspecified, not intractable, without status epilepticus: Secondary | ICD-10-CM | POA: Diagnosis present

## 2019-12-04 DIAGNOSIS — G459 Transient cerebral ischemic attack, unspecified: Secondary | ICD-10-CM | POA: Diagnosis present

## 2019-12-04 DIAGNOSIS — Z7982 Long term (current) use of aspirin: Secondary | ICD-10-CM | POA: Diagnosis not present

## 2019-12-04 DIAGNOSIS — G2581 Restless legs syndrome: Secondary | ICD-10-CM | POA: Diagnosis present

## 2019-12-04 DIAGNOSIS — I10 Essential (primary) hypertension: Secondary | ICD-10-CM | POA: Diagnosis present

## 2019-12-04 DIAGNOSIS — D539 Nutritional anemia, unspecified: Secondary | ICD-10-CM | POA: Diagnosis present

## 2019-12-04 DIAGNOSIS — F329 Major depressive disorder, single episode, unspecified: Secondary | ICD-10-CM | POA: Diagnosis present

## 2019-12-04 DIAGNOSIS — F419 Anxiety disorder, unspecified: Secondary | ICD-10-CM | POA: Diagnosis present

## 2019-12-04 DIAGNOSIS — F411 Generalized anxiety disorder: Secondary | ICD-10-CM | POA: Diagnosis present

## 2019-12-04 DIAGNOSIS — L03012 Cellulitis of left finger: Secondary | ICD-10-CM | POA: Diagnosis present

## 2019-12-04 DIAGNOSIS — K219 Gastro-esophageal reflux disease without esophagitis: Secondary | ICD-10-CM | POA: Diagnosis present

## 2019-12-04 LAB — CULTURE, BLOOD (ROUTINE X 2)
Culture: NO GROWTH
Culture: NO GROWTH
Special Requests: ADEQUATE

## 2019-12-04 LAB — ECHOCARDIOGRAM COMPLETE
Height: 67 in
Weight: 2081.14 [oz_av]

## 2019-12-04 LAB — LIPID PANEL
Cholesterol: 108 mg/dL (ref 0–200)
HDL: 41 mg/dL (ref >40)
LDL Cholesterol: 57 mg/dL (ref 0–99)
Total CHOL/HDL Ratio: 2.6 RATIO
Triglycerides: 51 mg/dL (ref <150)
VLDL: 10 mg/dL (ref 0–40)

## 2019-12-04 LAB — MAGNESIUM: Magnesium: 1.8 mg/dL (ref 1.7–2.4)

## 2019-12-04 MED ORDER — NICOTINE 14 MG/24HR TD PT24
14.0000 mg | MEDICATED_PATCH | Freq: Every day | TRANSDERMAL | Status: DC
  Administered 2019-12-04 – 2019-12-06 (×3): 14 mg via TRANSDERMAL
  Filled 2019-12-04 (×3): qty 1

## 2019-12-04 MED ORDER — POTASSIUM CHLORIDE CRYS ER 20 MEQ PO TBCR
40.0000 meq | EXTENDED_RELEASE_TABLET | Freq: Once | ORAL | Status: AC
  Administered 2019-12-04: 40 meq via ORAL
  Filled 2019-12-04: qty 4

## 2019-12-04 NOTE — Progress Notes (Signed)
PROGRESS NOTE    [name]  ZOX:[id number] DOB: Apr 10, 1956 DOA: 12/03/2019 PCP: Dettinger, Elige Radon, MD      Brief Narrative:  [name] is a 65 y.o. M with hx COPD, HTN, seizures, depression, alcohol use, early dementia, and recent admission for left thumb sialitis who presented with progressive weakness.  Since discharge from the hospital, patient has gotten gradually weaker, unable to participate in transfers, go to the restroom by himself, or walk at all.  Finally family thought that he had some new confusion, slurred speech, and facial droop so they brought him to the ER.  In the ER, afebrile, blood pressure 98/61.  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  The hospital service were asked to evaluate for weakness, possible facial droop.        Assessment & Plan:  Slurred speech, facial droop, need to rule out stroke Patient poor historian but family noticed acute facial droop, slurred speech and altered mental status generally.    CT head on arrival here showed a nonspecific abnormality in the left lentiform.  Outside the tPA window at that time. -Obtain MRI brain -Echocardiogram showed no cardiogenic source of embolism -Carotid imaging showed bilateral >70% stenoses --> if MRI brain confirms stroke, will transfer to [facility] for Vascular surgery consultation; otherwise will refer after discharge  -Lipids ordered: LDL 57, continue home simvastatin -Aspirin ordered at admission --> continue aspirin -Atrial fibrillation: not present on tele -tPA not given because outside the window -Dysphagia screen ordered in ER -PT eval ordered: recommending SNF -Smoking cessation: not pertinent   Weakness, generalized Patient with generalized weakness progressing since last discharge, now failing at home.  No focal signs, symptoms, labs to suggest infection, CHF, anemia, hypoxia as cause.    Orthostatics  negative. -PT eval  COPD without exacerbation  Hypertension BP low on admission -Hold metoprolol  History of seizures Not on AEDs, no seizures here  Depression Dementia -Continue Paxil, Risperdal, Buspar  Anemia, macrocytic New anemia, no clincal bleeding -Check B12, folate, TSH, smear, retics  Hypokalemia -Supplement K -Check mag  Bladder dysfunction -Continue Myrbetriq  RLS -Continue Mirapex  GERD -Continue PPI   Recent cellulitis This appears to be resolving.  It occurred due to a burn on the left thumb. -Continue cephalexin and doxycycline until 6/25         Disposition: Status is: Inpatient  Remains inpatient appropriate because:Unsafe d/c plan   Dispo: The patient is from: Home              Anticipated d/c is to: SNF              Anticipated d/c date is: 1 day              Patient currently is not medically stable to d/c.  Patient requires ongoing work up for stroke, and also requires evaluation for SNF rehab given his recent cellulitis                MDM: The below labs and imaging reports were reviewed and summarized above.  Medication management as above.   DVT prophylaxis: SCDs Start: 12/03/19 2056  Code Status: FULL Family Communication:     Consultants:     Procedures:     Antimicrobials:    Keflex and doxycycline 6/18 >>  Culture data:              Subjective: Patient has no  fever, vomiting, diarrhea.  He is confused, pleasantly.  He is generally weak.  He is not orthostatic.  Objective: Vitals:   12/04/19 0902 12/04/19 0932 12/04/19 1002 12/04/19 1100  BP: 133/71 (!) 148/75 130/60 (!) 119/54  Pulse: 80  93 88  Resp:      Temp:      TempSrc:      SpO2: 93%  96% 96%  Weight:      Height:        Intake/Output Summary (Last 24 hours) at 12/04/2019 1537 Last data filed at 12/03/2019 1651 Gross per 24 hour  Intake 999 ml  Output --  Net 999 ml   Filed Weights   12/03/19 1523  Weight:  59 kg    Examination: General appearance: Elderly adult male, awake and interactive, but confused.  In no obvious distress.  Sitting up in bed. HEENT: Anicteric, conjunctiva pink, lids and lashes normal. No nasal deformity, discharge, epistaxis.  Lips moist, dentures in place, oropharynx tacky dry, no oral lesions, hearing diminished.   Skin: Warm and dry.  No jaundice.  No suspicious rashes or lesions. Cardiac: RRR, nl S1-S2, no murmurs appreciated.  Capillary refill is brisk.  JVP normal.  No LE edema.  Radial pulses 2+ and symmetric. Respiratory: Normal respiratory rate and rhythm.  CTAB without rales or wheezes. Abdomen: Abdomen soft.  No TTP or guarding. No ascites, distension, hepatosplenomegaly.   MSK: No deformities or effusions.  Clubbing of the fingers bilaterally. Neuro: Awake and interactive, but sleepy and confused.  EOMI, moves all extremities with generalized weakness, but apparently symmetric strength. Speech fluent.   No obvious facial droop. Psych: Sensorium intact and responding to questions, attention diminished.  Affect blunted.  Judgment and insight appear moderately impaired by dementia.    Data Reviewed: I have personally reviewed following labs and imaging studies:  CBC: Recent Labs  Lab 11/29/19 1132 11/30/19 0913 12/03/19 1546  WBC 10.9* 7.5 8.9  NEUTROABS 8.5*  --  6.4  HGB 12.4* 12.8* 10.7*  HCT 38.1* 38.9* 32.3*  MCV 101.3* 99.7 100.9*  PLT 199 213 207   Basic Metabolic Panel: Recent Labs  Lab 11/29/19 1132 12/03/19 1546 12/04/19 0341  NA 142 137  --   K 3.5 3.2*  --   CL 105 101  --   CO2 27 24  --   GLUCOSE 121* 100*  --   BUN 22 16  --   CREATININE 0.77 0.68  --   CALCIUM 9.2 8.7*  --   MG  --   --  1.8   GFR: Estimated Creatinine Clearance: 76.8 mL/min (by C-G formula based on SCr of 0.68 mg/dL). Liver Function Tests: Recent Labs  Lab 12/03/19 1546  AST 34  ALT 16  ALKPHOS 60  BILITOT 0.8  PROT 6.3*  ALBUMIN 3.5   No  results for input(s): LIPASE, AMYLASE in the last 168 hours. No results for input(s): AMMONIA in the last 168 hours. Coagulation Profile: No results for input(s): INR, PROTIME in the last 168 hours. Cardiac Enzymes: No results for input(s): CKTOTAL, CKMB, CKMBINDEX, TROPONINI in the last 168 hours. BNP (last 3 results) No results for input(s): PROBNP in the last 8760 hours. HbA1C: No results for input(s): HGBA1C in the last 72 hours. CBG: Recent Labs  Lab 12/03/19 1551  GLUCAP 98   Lipid Profile: Recent Labs    12/04/19 0341  CHOL 108  HDL 41  LDLCALC 57  TRIG 51  CHOLHDL 2.6  Thyroid Function Tests: No results for input(s): TSH, T4TOTAL, FREET4, T3FREE, THYROIDAB in the last 72 hours. Anemia Panel: No results for input(s): VITAMINB12, FOLATE, FERRITIN, TIBC, IRON, RETICCTPCT in the last 72 hours. Urine analysis:    Component Value Date/Time   COLORURINE STRAW (A) 12/03/2019 1932   APPEARANCEUR CLEAR 12/03/2019 1932   APPEARANCEUR Clear 11/12/2017 1144   LABSPEC 1.004 (L) 12/03/2019 1932   PHURINE 7.0 12/03/2019 1932   GLUCOSEU NEGATIVE 12/03/2019 1932   HGBUR SMALL (A) 12/03/2019 1932   BILIRUBINUR NEGATIVE 12/03/2019 1932   BILIRUBINUR Negative 11/12/2017 1144   KETONESUR 5 (A) 12/03/2019 1932   PROTEINUR NEGATIVE 12/03/2019 1932   UROBILINOGEN negative 11/04/2012 1302   NITRITE NEGATIVE 12/03/2019 1932   LEUKOCYTESUR NEGATIVE 12/03/2019 1932   Sepsis Labs: @LABRCNTIP (procalcitonin:4,lacticacidven:4)  ) Recent Results (from the past 240 hour(s))  Blood culture (routine x 2)     Status: None   Collection Time: 11/29/19 11:15 AM   Specimen: BLOOD RIGHT FOREARM  Result Value Ref Range Status   Specimen Description BLOOD RIGHT FOREARM DRAWN BY IV THERAPY  Final   Special Requests   Final    BOTTLES DRAWN AEROBIC AND ANAEROBIC Blood Culture results may not be optimal due to an inadequate volume of blood received in culture bottles   Culture   Final    NO  GROWTH 5 DAYS Performed at [facility], [address], [place], [place] [address]    Report Status 12/04/2019 FINAL  Final  Blood culture (routine x 2)     Status: None   Collection Time: 11/29/19 12:01 PM   Specimen: BLOOD LEFT ARM  Result Value Ref Range Status   Specimen Description BLOOD LEFT ARM  Final   Special Requests   Final    BOTTLES DRAWN AEROBIC ONLY Blood Culture adequate volume   Culture   Final    NO GROWTH 5 DAYS Performed at [facility], [address], [place], [place] [address]    Report Status 12/04/2019 FINAL  Final  SARS Coronavirus 2 by RT PCR (hospital order, performed in [facility] hospital lab) Nasopharyngeal Nasopharyngeal Swab     Status: None   Collection Time: 11/29/19 12:17 PM   Specimen: Nasopharyngeal Swab  Result Value Ref Range Status   SARS Coronavirus 2 NEGATIVE NEGATIVE Final    Comment: (NOTE) SARS-CoV-2 target nucleic acids are NOT DETECTED.  The SARS-CoV-2 RNA is generally detectable in upper and lower respiratory specimens during the acute phase of infection. The lowest concentration of SARS-CoV-2 viral copies this assay can detect is 250 copies / mL. A negative result does not preclude SARS-CoV-2 infection and should not be used as the sole basis for treatment or other patient management decisions.  A negative result may occur with improper specimen collection / handling, submission of specimen other than nasopharyngeal swab, presence of viral mutation(s) within the areas targeted by this assay, and inadequate number of viral copies (<250 copies / mL). A negative result must be combined with clinical observations, patient history, and epidemiological information.  Fact Sheet for Patients:   StrictlyIdeas.no  Fact Sheet for Healthcare Providers: BankingDealers.co.za  This test is not yet approved or  cleared by the [place] FDA and has been authorized for detection and/or  diagnosis of SARS-CoV-2 by FDA under an Emergency Use Authorization (EUA).  This EUA will remain in effect (meaning this test can be used) for the duration of the COVID-19 declaration under Section 564(b)(1) of the Act, 21 U.S.C. section 360bbb-3(b)(1),  unless the authorization is terminated or revoked sooner.  Performed at [facility], [address], [place], [place] [address]   SARS Coronavirus 2 by RT PCR (hospital order, performed in [facility] hospital lab) Nasopharyngeal Nasopharyngeal Swab     Status: None   Collection Time: 12/03/19  9:50 PM   Specimen: Nasopharyngeal Swab  Result Value Ref Range Status   SARS Coronavirus 2 NEGATIVE NEGATIVE Final    Comment: (NOTE) SARS-CoV-2 target nucleic acids are NOT DETECTED.  The SARS-CoV-2 RNA is generally detectable in upper and lower respiratory specimens during the acute phase of infection. The lowest concentration of SARS-CoV-2 viral copies this assay can detect is 250 copies / mL. A negative result does not preclude SARS-CoV-2 infection and should not be used as the sole basis for treatment or other patient management decisions.  A negative result may occur with improper specimen collection / handling, submission of specimen other than nasopharyngeal swab, presence of viral mutation(s) within the areas targeted by this assay, and inadequate number of viral copies (<250 copies / mL). A negative result must be combined with clinical observations, patient history, and epidemiological information.  Fact Sheet for Patients:   BoilerBrush.com.cy  Fact Sheet for Healthcare Providers: https://pope.com/  This test is not yet approved or  cleared by the [place] FDA and has been authorized for detection and/or diagnosis of SARS-CoV-2 by FDA under an Emergency Use Authorization (EUA).  This EUA will remain in effect (meaning this test can be used) for the duration of the COVID-19  declaration under Section 564(b)(1) of the Act, 21 U.S.C. section 360bbb-3(b)(1), unless the authorization is terminated or revoked sooner.  Performed at [facility], [address], [place], [place] [address]          Radiology Studies: CT Head Wo Contrast  Result Date: 12/03/2019 CLINICAL DATA:  64 year old male with ataxia. EXAM: CT HEAD WITHOUT CONTRAST TECHNIQUE: Contiguous axial images were obtained from the base of the skull through the vertex without intravenous contrast. COMPARISON:  Head CT dated 07/09/2019. FINDINGS: Brain: Moderate age-related atrophy and chronic microvascular ischemic changes. 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 A 1 cm hypodense focus in the left lentiform nucleus is age indeterminate. There is no acute intracranial hemorrhage. No mass effect or midline shift. No extra-axial fluid collection. Vascular: No hyperdense vessel or unexpected calcification. Skull: No acute calvarial pathology. Left parietal and left frontal calvarial burr holes. Sinuses/Orbits: Diffuse mucoperiosteal thickening of paranasal sinuses with partial opacification of the left maxillary sinus. The mastoid air cells are clear. Other: None IMPRESSION: 1. No acute intracranial hemorrhage. 2. Age-related atrophy and chronic microvascular ischemic changes. 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 3. A 1 cm hypodense focus in the left lentiform nucleus is age indeterminate but new since the prior CT. MRI may provide better evaluation if clinically indicated. Electronically Signed   By: Elgie Collard M.D.   On: 12/03/2019 19:59   CT Lumbar Spine Wo Contrast  Result Date: 12/03/2019 CLINICAL DATA:  Initial evaluation for low back pain for greater than 6 weeks. EXAM: CT LUMBAR SPINE WITHOUT CONTRAST TECHNIQUE: Multidetector CT imaging of the lumbar spine was performed without intravenous contrast administration. Multiplanar CT image reconstructions were also generated. COMPARISON:  None  available. FINDINGS: Segmentation: Transitional lumbosacral anatomy with a partially sacralized L5 vertebral body. Lowest well-formed disc space labeled L5-S1. Alignment: Trace retrolisthesis of L2 on L3, chronic and facet mediated. Alignment otherwise normal with preservation of the normal lumbar lordosis. Vertebrae: Vertebral body  height well maintained without evidence for acute or chronic fracture. Visualized sacrum and pelvis intact. SI joints approximated and symmetric. No worrisome lytic or blastic osseous lesions. Paraspinal and other soft tissues: Paraspinous soft tissues demonstrate no acute finding. Extensive aorto bi-iliac atherosclerotic disease noted. No aneurysm. Remainder the visualized visceral structures otherwise unremarkable. Disc levels: T12-L1: Normal interspace. Mild bilateral facet hypertrophy. No canal or foraminal stenosis. L1-2: Mild annular disc bulge. Superimposed left foraminal to extraforaminal disc protrusion (series 4, image 41). Finding closely approximates and could potentially affect the exiting left L1 nerve root. Mild facet and ligament flavum hypertrophy. No significant spinal stenosis. Foramina remain patent. L2-3: Trace retrolisthesis. Diffuse disc bulge, asymmetric to the right. Superimposed broad-based right foraminal to extraforaminal disc protrusion (series 4, image 57). Superimposed moderate facet and ligament flavum hypertrophy. No more than mild spinal stenosis. Moderate right worse than left L2 foraminal narrowing. L3-4: Mild diffuse disc bulge. Moderate facet and ligament flavum hypertrophy. Probable mild narrowing of the lateral recesses bilaterally. Moderate bilateral L3 foraminal stenosis. L4-5: Diffuse disc bulge, eccentric to the right. Right-sided reactive endplate changes with marginal endplate spurring. Superimposed small right paracentral to foraminal disc protrusion with slight superior angulation (series 4, image 87). Severe right worse than left facet  hypertrophy. Resultant moderate right with mild left lateral recess stenosis and foraminal narrowing. L5-S1: Transitional lumbosacral anatomy with partially sacralized L5 vertebral body. Mild intervertebral disc space narrowing without significant disc bulge. Moderate bilateral facet hypertrophy. No canal or foraminal stenosis. IMPRESSION: 1. No acute abnormality within the lumbar spine. 2. Multifactorial degenerative changes at L4-5 with resultant moderate right lateral recess and foraminal stenosis. Either the right L4 or descending L5 nerve roots could be affected. 3. Left foraminal to extraforaminal disc protrusion at L1-2, potentially affecting the exiting left L1 nerve root. 4. Moderate bilateral L2 and L3 foraminal stenosis based on disc bulging and facet hypertrophy. 5. Aortic Atherosclerosis (ICD10-I70.0). Electronically Signed   By: Rise Mu M.D.   On: 12/03/2019 20:24   US Carotid Bilateral (at [facility] and [facility] only)  Result Date: 12/04/2019 CLINICAL DATA:  Hypertension, coronary disease, hyperlipidemia EXAM: BILATERAL CAROTID DUPLEX ULTRASOUND TECHNIQUE: Wallace Cullens scale imaging, color Doppler and duplex ultrasound were performed of bilateral carotid and vertebral arteries in the neck. COMPARISON:  None. FINDINGS: Criteria: Quantification of carotid stenosis is based on velocity parameters that correlate the residual internal carotid diameter with NASCET-based stenosis levels, using the diameter of the distal internal carotid lumen as the denominator for stenosis measurement. The following velocity measurements were obtained: RIGHT ICA: 608/242 cm/sec CCA: 67/18 cm/sec SYSTOLIC ICA/CCA RATIO:  9.1 ECA: 481 cm/sec LEFT ICA: 308/61 cm/sec CCA: 124/22 cm/sec SYSTOLIC ICA/CCA RATIO:  2.5 ECA: 415 cm/sec RIGHT CAROTID ARTERY: Marked calcified bifurcation atherosclerosis involving the ICA and ECA origins. Right mid ICA velocity elevation measures up to 608/242 centimeters/second with focal aliasing and  spectral broadening. Right ICA stenosis estimated at greater than 70%. RIGHT VERTEBRAL ARTERY:  Normal antegrade flow LEFT CAROTID ARTERY: Similar marked calcified bifurcation atherosclerosis. Left mid ICA velocity elevation measures up to 306/60 centimeters/second with mild turbulent flow. Left ICA stenosis also estimated at greater than 70% by ultrasound criteria. LEFT VERTEBRAL ARTERY:  Normal antegrade flow IMPRESSION: Bilateral ICA stenoses estimated at greater than 70% by ultrasound criteria, more severe on the right. Normal patent antegrade vertebral flow bilaterally Electronically Signed   By: Judie Petit.  Shick M.D.   On: 12/04/2019 09:48   DG Chest Port 1 View  Result Date: 12/03/2019 CLINICAL  DATA:  Cough.  Weakness.  Diffuse pain. EXAM: PORTABLE CHEST 1 VIEW COMPARISON:  11/07/2019 and 07/09/2019 FINDINGS: The heart size and pulmonary vascularity are normal. No infiltrates or effusions. Chronic slight accentuation of the interstitial markings bilaterally. No acute bone abnormality. IMPRESSION: No acute cardiopulmonary disease. Chronic interstitial lung disease. Electronically Signed   By: Francene Boyers M.D.   On: 12/03/2019 16:41   ECHOCARDIOGRAM COMPLETE  Result Date: 12/04/2019    ECHOCARDIOGRAM REPORT   Patient Name:   TOAN MORT Date of Exam: 12/04/2019 Medical Rec #:  [id number]           Height:       67.0 in Accession #:    [id number]          Weight:       130.1 lb Date of Birth:  26-Dec-1955           BSA:          1.684 m Patient Age:    63 years            BP:           133/76 mmHg Patient Gender: M                   HR:           85 bpm. [facility] Procedure: 2D Echo Indications:    TIA 435.9 / G45.9  History:        Patient has no prior history of Echocardiogram examinations.                 CAD, COPD; Risk Factors:Current Smoker, Dyslipidemia and                 Hypertension. ETOH, CEREBRAL ANEURYSM.  Sonographer:    [name] RDCS (AE) Referring Phys: [id number]  DAVID MANUEL ORTIZ IMPRESSIONS  1. Left ventricular ejection fraction, by estimation, is 55 to 60%. The left ventricle has normal function. The left ventricle has no regional wall motion abnormalities. There is mild left ventricular hypertrophy. Left ventricular diastolic parameters were normal.  2. Right ventricular systolic function is normal. The right ventricular size is normal.  3. The mitral valve is normal in structure. Mild mitral valve regurgitation. No evidence of mitral stenosis.  4. The aortic valve was not well visualized. Aortic valve regurgitation is not visualized. No aortic stenosis is present.  5. The inferior vena cava is normal in size with greater than 50% respiratory variability, suggesting right atrial pressure of 3 mmHg. FINDINGS  Left Ventricle: Left ventricular ejection fraction, by estimation, is 55 to 60%. The left ventricle has normal function. The left ventricle has no regional wall motion abnormalities. The left ventricular internal cavity size was normal in size. There is  mild left ventricular hypertrophy. Left ventricular diastolic parameters were normal. Right Ventricle: The right ventricular size is normal. No increase in right ventricular wall thickness. Right ventricular systolic function is normal. Left Atrium: Left atrial size was normal in size. Right Atrium: Right atrial size was normal in size. Pericardium: There is no evidence of pericardial effusion. Mitral Valve: The mitral valve is normal in structure. Mild mitral valve regurgitation. No evidence of mitral valve stenosis. Tricuspid Valve: The tricuspid valve is normal in structure. Tricuspid valve regurgitation is not demonstrated. No evidence of tricuspid stenosis. Aortic Valve: The aortic valve was not well visualized. . There is mild thickening and mild calcification of the aortic valve. Aortic valve  regurgitation is not visualized. No aortic stenosis is present. Mild aortic valve annular calcification. There is mild  thickening of the aortic valve. There is mild calcification of the aortic valve. Aortic valve mean gradient measures 2.7 mmHg. Aortic valve peak gradient measures 6.3 mmHg. Aortic valve area, by VTI measures 2.60 cm. Pulmonic Valve: The pulmonic valve was not well visualized. Pulmonic valve regurgitation is not visualized. No evidence of pulmonic stenosis. Aorta: The aortic root is normal in size and structure. Venous: The inferior vena cava is normal in size with greater than 50% respiratory variability, suggesting right atrial pressure of 3 mmHg. IAS/Shunts: No atrial level shunt detected by color flow Doppler.  LEFT VENTRICLE PLAX 2D LVIDd:         4.57 cm  Diastology LVIDs:         3.55 cm  LV e' lateral:   9.25 cm/s LV PW:         1.26 cm  LV E/e' lateral: 6.9 LV IVS:        1.08 cm  LV e' medial:    7.72 cm/s LVOT diam:     2.00 cm  LV E/e' medial:  8.3 LV SV:         59 LV SV Index:   35 LVOT Area:     3.14 cm  RIGHT VENTRICLE RV S prime:     14.00 cm/s TAPSE (M-mode): 2.5 cm LEFT ATRIUM             Index       RIGHT ATRIUM           Index LA diam:        3.50 cm 2.08 cm/m  RA Area:     14.60 cm LA Vol (A2C):   66.4 ml 39.42 ml/m RA Volume:   39.30 ml  23.33 ml/m LA Vol (A4C):   32.5 ml 19.30 ml/m LA Biplane Vol: 47.8 ml 28.38 ml/m  AORTIC VALVE AV Area (Vmax):    2.31 cm AV Area (Vmean):   2.46 cm AV Area (VTI):     2.60 cm AV Vmax:           125.96 cm/s AV Vmean:          74.917 cm/s AV VTI:            0.227 m AV Peak Grad:      6.3 mmHg AV Mean Grad:      2.7 mmHg LVOT Vmax:         92.65 cm/s LVOT Vmean:        58.641 cm/s LVOT VTI:          0.188 m LVOT/AV VTI ratio: 0.83  AORTA Ao Root diam: 3.00 cm MITRAL VALVE MV Area (PHT): 3.99 cm    SHUNTS MV Decel Time: 190 msec    Systemic VTI:  0.19 m MV E velocity: 64.00 cm/s  Systemic Diam: 2.00 cm MV A velocity: 42.70 cm/s MV E/A ratio:  1.50 Dina Rich MD Electronically signed by Dina Rich MD Signature Date/Time: 12/04/2019/2:13:03 PM     Final         Scheduled Meds: . aspirin EC  81 mg Oral Q breakfast  . busPIRone  10 mg Oral TID  . cephALEXin  500 mg Oral TID  . doxycycline  100 mg Oral BID  . folic acid  1 mg Oral Daily  . melatonin  9 mg Oral QHS  . mirabegron ER  25 mg Oral  Daily  . multivitamin with minerals  1 tablet Oral Daily  . mupirocin ointment  1 application Topical BID  . niacin  1,000 mg Oral QHS  . pantoprazole  40 mg Oral Daily  . PARoxetine  20 mg Oral Daily  . pramipexole  1 mg Oral QHS  . risperidone  4 mg Oral QHS  . simvastatin  20 mg Oral QHS  . sodium chloride flush  3 mL Intravenous Once  . thiamine  100 mg Oral Daily   Continuous Infusions:   LOS: 0 days    Time spent: 25 minutes    Alberteen Samhristopher P Mariaelena Cade, MD [facility] 12/04/2019, 3:37 PM     Please page though AMION or Epic secure chat:  For [employer] Corporation[employer] password, Higher education careers advisercontact charge nurse

## 2019-12-04 NOTE — TOC Initial Note (Signed)
Transition of Care [facility]) - Initial/Assessment Note   Patient Details  Name: [name] MRN: [id number] Date of Birth: December 10, 1955  Transition of Care [facility]) CM/SW Contact:    Sherie Don, LCSW Phone Number: 12/04/2019, 4:59 PM  Clinical Narrative: Patient is a 64 year old male who was admitted for ataxia. ReAdmission Prevention screening completed due to high re-admission score. TOC received consult for SNF. PT evaluation recommended SNF. CSW met with patient to discuss SNF. Patient agreeable to SNF, but requested that CSW call his sister, Ladon Applebaum, to get choices. CSW called sister and sister requested [place], [facility] and [place]. FL2 completed and faxed out. Requested PASRR. PASRR number is pending. Requested documents uploaded.       Expected Discharge Plan: [place] Barriers to Discharge: Continued Medical Work up, [place] (PASRR)  Patient Goals and CMS Choice Patient states their goals for this hospitalization and ongoing recovery are:: Discharge to SNF for rehab CMS Medicare.gov Compare Post Acute Care list provided to:: Patient Choice offered to / list presented to : Patient, Sibling  Expected Discharge Plan and Services Expected Discharge Plan: [place] In-house Referral: Clinical Social Work Post Acute Care Choice: [place] Living arrangements for the past 2 months: [place]  Prior Living Arrangements/Services Living arrangements for the past 2 months: Single Family Home Lives with:: Siblings Patient language and need for interpreter reviewed:: Yes Do you feel safe going back to the place where you live?: Yes      Need for Family Participation in Patient Care: No (Comment) Care giver support system in place?: Yes (comment) (Azzie Doctor, general practice (sister)) Criminal Activity/Legal Involvement Pertinent to Current Situation/Hospitalization: No - Comment as needed  Activities of  Daily Living Home Assistive Devices/Equipment: Dentures (specify type), Vent/Trach supplies ADL Screening (condition at time of admission) Patient's cognitive ability adequate to safely complete daily activities?: Yes Is the patient deaf or have difficulty hearing?: No Does the patient have difficulty seeing, even when wearing glasses/contacts?: No Does the patient have difficulty concentrating, remembering, or making decisions?: No Patient able to express need for assistance with ADLs?: Yes Does the patient have difficulty dressing or bathing?: No Independently performs ADLs?: No Communication: Independent Dressing (OT): Needs assistance Is this a change from baseline?: Pre-admission baseline Grooming: Needs assistance Is this a change from baseline?: Pre-admission baseline Feeding: Independent Bathing: Needs assistance Is this a change from baseline?: Pre-admission baseline Toileting: Independent Is this a change from baseline?: Pre-admission baseline In/Out Bed: Independent Walks in Home: Independent Does the patient have difficulty walking or climbing stairs?: No Weakness of Legs: None Weakness of Arms/Hands: None  Permission Sought/Granted Permission granted to share information with : Yes, Verbal Permission Granted Permission granted to share info w AGENCY: SNFs  Emotional Assessment Appearance:: Appears stated age Attitude/Demeanor/Rapport: Engaged Affect (typically observed): Accepting Orientation: : Oriented to Self, Oriented to Place, Oriented to Situation, Oriented to  Time Alcohol / Substance Use: Tobacco Use Psych Involvement: No (comment)  Admission diagnosis:  TIA (transient ischemic attack) [G45.9] Hypokalemia [E87.6] Ataxia [R27.0] Iron deficiency anemia due to chronic blood loss [D50.0] Weakness [R53.1] Patient Active Problem List   Diagnosis Date Noted  . Weakness 12/04/2019  . Ataxia 12/03/2019  . Hypokalemia 12/03/2019  . Macrocytic anemia  12/03/2019  . Cellulitis of left thumb 11/29/2019  . Mild tetrahydrocannabinol (THC) abuse 08/09/2019  . Controlled substance agreement signed 03/14/2018  . DDD (degenerative disc disease), cervical 08/24/2017  . Arthritis 07/29/2017  .  Memory loss or impairment 07/08/2016  . Loss of weight 07/07/2013  . Frequent urination 11/04/2012  . Seizure disorder ([place]) 11/04/2012  . RLS (restless legs syndrome) 11/04/2012  . HLD (hyperlipidemia) 11/04/2012  . CAD (coronary artery disease) 11/04/2012  . HEAD TRAUMA, CLOSED 04/16/2009  . Hyperlipidemia 06/05/2008  . Generalized anxiety disorder 06/05/2008  . History of alcohol abuse 06/05/2008  . TOBACCO ABUSE 06/05/2008  . Depression, recurrent ([place]) 06/05/2008  . OBSTRUCTIVE SLEEP APNEA 06/05/2008  . Essential hypertension 06/05/2008  . CEREBRAL ANEURYSM 06/05/2008  . COPD (chronic obstructive pulmonary disease) ([place]) 06/05/2008  . GERD 06/05/2008  . HEADACHE, CHRONIC 06/05/2008   PCP:  Dettinger, Fransisca Kaufmann, MD Pharmacy:   [facility], [address] [place] [address] Phone: [phone] Fax: [phone]  Readmission Risk Interventions Readmission Risk Prevention Plan 12/04/2019  Transportation Screening Complete  Medication Review (RN Care Manager) Complete  PCP or Specialist appointment within 3-5 days of discharge Not Complete  PCP/Specialist Appt Not Complete comments Patient discharging to SNF for rehab  [place] or Home Care Consult Complete  SW Recovery Care/Counseling Consult Complete  Palliative Care Screening Not Applicable  Skilled Nursing Facility Complete  Some recent data might be hidden

## 2019-12-04 NOTE — ED Notes (Addendum)
ED TO INPATIENT HANDOFF REPORT  ED Nurse Name and Phone #: Tilman NeatJennifer Khylan Sawyer,RN [phone]  S Name/Age/Gender [name] 64 y.o. male Room/Bed: APA06/APA06  Code Status   Code Status: Full Code  Home/SNF/Other Home Patient oriented to: self,place,situation, time (intermittent confusion) Is this baseline? Yes   Triage Complete: Triage complete  Chief Complaint Ataxia [R27.0]  Triage Note EMS reports pt recently discharged from hospital.  Wife told ems pt is very weak and c/o pain all over.  Reports was discharged 2 days ago.  Also had burn to left thumb.  EMS reports vss.  EMS started 20g IV in r ac pta.      Allergies Allergies  Allergen Reactions  . Clonidine Rash    Level of Care/Admitting Diagnosis ED Disposition    ED Disposition Condition Comment   Admit  Hospital Area: [facility] [[id number]]  Level of Care: Telemetry [5]  Covid Evaluation: Asymptomatic Screening Protocol (No Symptoms)  Diagnosis: Ataxia [[id number]][[id number]]  Admitting Physician: Bobette MoTIZ, DAVID MANUEL [[id number]]  Attending Physician: Bobette MoORTIZ, DAVID MANUEL [[id number]]       B Medical/Surgery History Past Medical History:  Diagnosis Date  . Anxiety   . Cerebral aneurysm   . Closed head injury   . COPD (chronic obstructive pulmonary disease) (HCC)   . Depression   . GERD (gastroesophageal reflux disease)   . Hyperlipidemia   . Hypertension   . Seizures (HCC)   . Sleep apnea    History reviewed. No pertinent surgical history.   A IV Location/Drains/Wounds Patient Lines/Drains/Airways Status    Active Line/Drains/Airways    Name Placement date Placement time Site Days   Peripheral IV 12/03/19 Right Antecubital 12/03/19  1549  Antecubital  1          Intake/Output Last 24 hours  Intake/Output Summary (Last 24 hours) at 12/04/2019 1058 Last data filed at 12/03/2019 1651 Gross per 24 hour  Intake 999 ml  Output --  Net 999 ml    Labs/Imaging Results for orders placed or  performed during the hospital encounter of 12/03/19 (from the past 48 hour(s))  Basic metabolic panel     Status: Abnormal   Collection Time: 12/03/19  3:46 PM  Result Value Ref Range   Sodium 137 135 - 145 mmol/L   Potassium 3.2 (L) 3.5 - 5.1 mmol/L   Chloride 101 98 - 111 mmol/L   CO2 24 22 - 32 mmol/L   Glucose, Bld 100 (H) 70 - 99 mg/dL    Comment: Glucose reference range applies only to samples taken after fasting for at least 8 hours.   BUN 16 8 - 23 mg/dL   Creatinine, Ser 0.860.68 0.61 - 1.24 mg/dL   Calcium 8.7 (L) 8.9 - 10.3 mg/dL   GFR calc non Af Amer >60 >60 mL/min   GFR calc Af Amer >60 >60 mL/min   Anion gap 12 5 - 15    Comment: Performed at [facility], [address] [place], [place] [address]  Hepatic function panel     Status: Abnormal   Collection Time: 12/03/19  3:46 PM  Result Value Ref Range   Total Protein 6.3 (L) 6.5 - 8.1 g/dL   Albumin 3.5 3.5 - 5.0 g/dL   AST 34 15 - 41 U/L   ALT 16 0 - 44 U/L   Alkaline Phosphatase 60 38 - 126 U/L   Total Bilirubin 0.8 0.3 - 1.2 mg/dL   Bilirubin, Direct 0.1 0.0 - 0.2 mg/dL   Indirect  Bilirubin 0.7 0.3 - 0.9 mg/dL    Comment: Performed at [facility], [address], [place], [place] [address]  Lactic acid, plasma     Status: None   Collection Time: 12/03/19  3:46 PM  Result Value Ref Range   Lactic Acid, Venous 1.3 0.5 - 1.9 mmol/L    Comment: Performed at [facility], [address], [place], [place] [address]  CBC with Differential/Platelet     Status: Abnormal   Collection Time: 12/03/19  3:46 PM  Result Value Ref Range   WBC 8.9 4.0 - 10.5 K/uL   RBC 3.20 (L) 4.22 - 5.81 MIL/uL   Hemoglobin 10.7 (L) 13.0 - 17.0 g/dL   HCT 09.6 (L) 39 - 52 %   MCV 100.9 (H) 80.0 - 100.0 fL   MCH 33.4 26.0 - 34.0 pg   MCHC 33.1 30.0 - 36.0 g/dL   RDW 28.3 66.2 - 94.7 %   Platelets 207 150 - 400 K/uL   nRBC 0.0 0.0 - 0.2 %   Neutrophils Relative % 72 %   Neutro Abs 6.4 1.7 - 7.7 K/uL   Lymphocytes Relative 18 %   Lymphs  Abs 1.6 0.7 - 4.0 K/uL   Monocytes Relative 9 %   Monocytes Absolute 0.8 0 - 1 K/uL   Eosinophils Relative 0 %   Eosinophils Absolute 0.0 0 - 0 K/uL   Basophils Relative 1 %   Basophils Absolute 0.1 0 - 0 K/uL   Immature Granulocytes 0 %   Abs Immature Granulocytes 0.02 0.00 - 0.07 K/uL    Comment: Performed at [facility], [address] [place], [place] [address]  CBG monitoring, ED     Status: None   Collection Time: 12/03/19  3:51 PM  Result Value Ref Range   Glucose-Capillary 98 70 - 99 mg/dL    Comment: Glucose reference range applies only to samples taken after fasting for at least 8 hours.   Comment 1 Notify RN   POC occult blood, ED     Status: None   Collection Time: 12/03/19  5:53 PM  Result Value Ref Range   Fecal Occult Bld NEGATIVE NEGATIVE  Urinalysis, Routine w reflex microscopic     Status: Abnormal   Collection Time: 12/03/19  7:32 PM  Result Value Ref Range   Color, Urine STRAW (A) YELLOW   APPearance CLEAR CLEAR   Specific Gravity, Urine 1.004 (L) 1.005 - 1.030   pH 7.0 5.0 - 8.0   Glucose, UA NEGATIVE NEGATIVE mg/dL   Hgb urine dipstick SMALL (A) NEGATIVE   Bilirubin Urine NEGATIVE NEGATIVE   Ketones, ur 5 (A) NEGATIVE mg/dL   Protein, ur NEGATIVE NEGATIVE mg/dL   Nitrite NEGATIVE NEGATIVE   Leukocytes,Ua NEGATIVE NEGATIVE   RBC / HPF 0-5 0 - 5 RBC/hpf   WBC, UA 0-5 0 - 5 WBC/hpf   Bacteria, UA NONE SEEN NONE SEEN    Comment: Performed at [facility], [address], [place], [place] [address]  SARS Coronavirus 2 by RT PCR (hospital order, performed in [facility] hospital lab) Nasopharyngeal Nasopharyngeal Swab     Status: None   Collection Time: 12/03/19  9:50 PM   Specimen: Nasopharyngeal Swab  Result Value Ref Range   SARS Coronavirus 2 NEGATIVE NEGATIVE    Comment: (NOTE) SARS-CoV-2 target nucleic acids are NOT DETECTED.  The SARS-CoV-2 RNA is generally detectable in upper and lower respiratory specimens during the acute phase of infection.  The lowest concentration of SARS-CoV-2  viral copies this assay can detect is 250 copies / mL. A negative result does not preclude SARS-CoV-2 infection and should not be used as the sole basis for treatment or other patient management decisions.  A negative result may occur with improper specimen collection / handling, submission of specimen other than nasopharyngeal swab, presence of viral mutation(s) within the areas targeted by this assay, and inadequate number of viral copies (<250 copies / mL). A negative result must be combined with clinical observations, patient history, and epidemiological information.  Fact Sheet for Patients:   StrictlyIdeas.no  Fact Sheet for Healthcare Providers: BankingDealers.co.za  This test is not yet approved or  cleared by the [place] FDA and has been authorized for detection and/or diagnosis of SARS-CoV-2 by FDA under an Emergency Use Authorization (EUA).  This EUA will remain in effect (meaning this test can be used) for the duration of the COVID-19 declaration under Section 564(b)(1) of the Act, 21 U.S.C. section 360bbb-3(b)(1), unless the authorization is terminated or revoked sooner.  Performed at [facility], [address], [place], [place] [address]   Lipid panel     Status: None   Collection Time: 12/04/19  3:41 AM  Result Value Ref Range   Cholesterol 108 0 - 200 mg/dL   Triglycerides 51 <150 mg/dL   HDL 41 >40 mg/dL   Total CHOL/HDL Ratio 2.6 RATIO   VLDL 10 0 - 40 mg/dL   LDL Cholesterol 57 0 - 99 mg/dL    Comment:        Total Cholesterol/HDL:CHD Risk Coronary Heart Disease Risk Table                     Men   Women  1/2 Average Risk   3.4   3.3  Average Risk       5.0   4.4  2 X Average Risk   9.6   7.1  3 X Average Risk  23.4   11.0        Use the calculated Patient Ratio above and the CHD Risk Table to determine the patient's CHD Risk.        ATP III CLASSIFICATION  (LDL):  <100     mg/dL   Optimal  100-129  mg/dL   Near or Above                    Optimal  130-159  mg/dL   Borderline  160-189  mg/dL   High  >190     mg/dL   Very High Performed at [address] [place], [place] [address]   Magnesium     Status: None   Collection Time: 12/04/19  3:41 AM  Result Value Ref Range   Magnesium 1.8 1.7 - 2.4 mg/dL    Comment: Performed at [facility], [address] [place], [place] [address]   CT Head Wo Contrast  Result Date: 12/03/2019 CLINICAL DATA:  64 year old male with ataxia. EXAM: CT HEAD WITHOUT CONTRAST TECHNIQUE: Contiguous axial images were obtained from the base of the skull through the vertex without intravenous contrast. COMPARISON:  Head CT dated 07/09/2019. FINDINGS: Brain: Moderate age-related atrophy and chronic microvascular ischemic changes. 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 A 1 cm hypodense focus in the left lentiform nucleus is age indeterminate. There is no acute intracranial hemorrhage. No mass effect or midline shift. No extra-axial fluid collection. Vascular: No hyperdense vessel or unexpected calcification. Skull: No acute calvarial pathology.  Left parietal and left frontal calvarial burr holes. Sinuses/Orbits: Diffuse mucoperiosteal thickening of paranasal sinuses with partial opacification of the left maxillary sinus. The mastoid air cells are clear. Other: None IMPRESSION: 1. No acute intracranial hemorrhage. 2. Age-related atrophy and chronic microvascular ischemic changes. 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 3. A 1 cm hypodense focus in the left lentiform nucleus is age indeterminate but new since the prior CT. MRI may provide better evaluation if clinically indicated. Electronically Signed   By: Elgie Collard M.D.   On: 12/03/2019 19:59   CT Lumbar Spine Wo Contrast  Result Date: 12/03/2019 CLINICAL DATA:  Initial evaluation for low back pain for greater than 6 weeks. EXAM: CT LUMBAR  SPINE WITHOUT CONTRAST TECHNIQUE: Multidetector CT imaging of the lumbar spine was performed without intravenous contrast administration. Multiplanar CT image reconstructions were also generated. COMPARISON:  None available. FINDINGS: Segmentation: Transitional lumbosacral anatomy with a partially sacralized L5 vertebral body. Lowest well-formed disc space labeled L5-S1. Alignment: Trace retrolisthesis of L2 on L3, chronic and facet mediated. Alignment otherwise normal with preservation of the normal lumbar lordosis. Vertebrae: Vertebral body height well maintained without evidence for acute or chronic fracture. Visualized sacrum and pelvis intact. SI joints approximated and symmetric. No worrisome lytic or blastic osseous lesions. Paraspinal and other soft tissues: Paraspinous soft tissues demonstrate no acute finding. Extensive aorto bi-iliac atherosclerotic disease noted. No aneurysm. Remainder the visualized visceral structures otherwise unremarkable. Disc levels: T12-L1: Normal interspace. Mild bilateral facet hypertrophy. No canal or foraminal stenosis. L1-2: Mild annular disc bulge. Superimposed left foraminal to extraforaminal disc protrusion (series 4, image 41). Finding closely approximates and could potentially affect the exiting left L1 nerve root. Mild facet and ligament flavum hypertrophy. No significant spinal stenosis. Foramina remain patent. L2-3: Trace retrolisthesis. Diffuse disc bulge, asymmetric to the right. Superimposed broad-based right foraminal to extraforaminal disc protrusion (series 4, image 57). Superimposed moderate facet and ligament flavum hypertrophy. No more than mild spinal stenosis. Moderate right worse than left L2 foraminal narrowing. L3-4: Mild diffuse disc bulge. Moderate facet and ligament flavum hypertrophy. Probable mild narrowing of the lateral recesses bilaterally. Moderate bilateral L3 foraminal stenosis. L4-5: Diffuse disc bulge, eccentric to the right. Right-sided  reactive endplate changes with marginal endplate spurring. Superimposed small right paracentral to foraminal disc protrusion with slight superior angulation (series 4, image 87). Severe right worse than left facet hypertrophy. Resultant moderate right with mild left lateral recess stenosis and foraminal narrowing. L5-S1: Transitional lumbosacral anatomy with partially sacralized L5 vertebral body. Mild intervertebral disc space narrowing without significant disc bulge. Moderate bilateral facet hypertrophy. No canal or foraminal stenosis. IMPRESSION: 1. No acute abnormality within the lumbar spine. 2. Multifactorial degenerative changes at L4-5 with resultant moderate right lateral recess and foraminal stenosis. Either the right L4 or descending L5 nerve roots could be affected. 3. Left foraminal to extraforaminal disc protrusion at L1-2, potentially affecting the exiting left L1 nerve root. 4. Moderate bilateral L2 and L3 foraminal stenosis based on disc bulging and facet hypertrophy. 5. Aortic Atherosclerosis (ICD10-I70.0). Electronically Signed   By: Rise Mu M.D.   On: 12/03/2019 20:24   US Carotid Bilateral (at [facility] only)  Result Date: 12/04/2019 CLINICAL DATA:  Hypertension, coronary disease, hyperlipidemia EXAM: BILATERAL CAROTID DUPLEX ULTRASOUND TECHNIQUE: Wallace Cullens scale imaging, color Doppler and duplex ultrasound were performed of bilateral carotid and vertebral arteries in the neck. COMPARISON:  None. FINDINGS: Criteria: Quantification of carotid stenosis is based on velocity parameters that correlate the residual internal  carotid diameter with NASCET-based stenosis levels, using the diameter of the distal internal carotid lumen as the denominator for stenosis measurement. The following velocity measurements were obtained: RIGHT ICA: 608/242 cm/sec CCA: 67/18 cm/sec SYSTOLIC ICA/CCA RATIO:  9.1 ECA: 481 cm/sec LEFT ICA: 308/61 cm/sec CCA: 124/22 cm/sec SYSTOLIC ICA/CCA RATIO:  2.5 ECA:  415 cm/sec RIGHT CAROTID ARTERY: Marked calcified bifurcation atherosclerosis involving the ICA and ECA origins. Right mid ICA velocity elevation measures up to 608/242 centimeters/second with focal aliasing and spectral broadening. Right ICA stenosis estimated at greater than 70%. RIGHT VERTEBRAL ARTERY:  Normal antegrade flow LEFT CAROTID ARTERY: Similar marked calcified bifurcation atherosclerosis. Left mid ICA velocity elevation measures up to 306/60 centimeters/second with mild turbulent flow. Left ICA stenosis also estimated at greater than 70% by ultrasound criteria. LEFT VERTEBRAL ARTERY:  Normal antegrade flow IMPRESSION: Bilateral ICA stenoses estimated at greater than 70% by ultrasound criteria, more severe on the right. Normal patent antegrade vertebral flow bilaterally Electronically Signed   By: Judie Petit.  Shick M.D.   On: 12/04/2019 09:48   DG Chest Port 1 View  Result Date: 12/03/2019 CLINICAL DATA:  Cough.  Weakness.  Diffuse pain. EXAM: PORTABLE CHEST 1 VIEW COMPARISON:  11/07/2019 and 07/09/2019 FINDINGS: The heart size and pulmonary vascularity are normal. No infiltrates or effusions. Chronic slight accentuation of the interstitial markings bilaterally. No acute bone abnormality. IMPRESSION: No acute cardiopulmonary disease. Chronic interstitial lung disease. Electronically Signed   By: Francene Boyers M.D.   On: 12/03/2019 16:41    Pending Labs Unresulted Labs (From admission, onward) Comment          Start     Ordered   12/04/19 0500  Hemoglobin A1c  Tomorrow morning,   R        12/03/19 2059   12/03/19 1749  Occult blood card to lab, stool Provider will collect  Once,   STAT       Question:  Specimen to be collected by:  Answer:  Provider will collect   12/03/19 1748          Vitals/Pain Today's Vitals   12/04/19 0857 12/04/19 0902 12/04/19 0932 12/04/19 1002  BP:  133/71 (!) 148/75 130/60  Pulse:  80  93  Resp:      Temp:      TempSrc:      SpO2:  93%  96%  Weight:       Height:      PainSc: 0-No pain       Isolation Precautions No active isolations  Medications Medications  sodium chloride flush (NS) 0.9 % injection 3 mL (3 mLs Intravenous Not Given 12/03/19 1539)  acetaminophen (TYLENOL) tablet 650 mg (has no administration in time range)    Or  acetaminophen (TYLENOL) suppository 650 mg (has no administration in time range)  ondansetron (ZOFRAN) tablet 4 mg (has no administration in time range)    Or  ondansetron (ZOFRAN) injection 4 mg (has no administration in time range)   stroke: mapping our early stages of recovery book (has no administration in time range)  LORazepam (ATIVAN) injection 0.5 mg (has no administration in time range)  thiamine tablet 100 mg (100 mg Oral Given 12/04/19 1044)  simvastatin (ZOCOR) tablet 20 mg (20 mg Oral Given 12/03/19 2358)  risperiDONE (RISPERDAL) tablet 4 mg (4 mg Oral Given 12/03/19 2359)  pramipexole (MIRAPEX) tablet 1 mg (1 mg Oral Not Given 12/04/19 0002)  PARoxetine (PAXIL) tablet 20 mg (20 mg Oral Not Given 12/04/19 0000)  niacin (NIASPAN) CR tablet 1,000 mg (1,000 mg Oral Not Given 12/04/19 0001)  Melatonin TABS 10 mg (10 mg Oral Not Given 12/03/19 2257)  mupirocin ointment (BACTROBAN) 2 % 1 application (1 application Topical Given 12/04/19 1044)  multivitamin with minerals tablet 1 tablet (1 tablet Oral Given 12/04/19 1044)  mirabegron ER (MYRBETRIQ) tablet 25 mg (25 mg Oral Not Given 12/03/19 2257)  cephALEXin (KEFLEX) capsule 500 mg (500 mg Oral Given 12/04/19 1021)  aspirin EC tablet 81 mg (81 mg Oral Given 12/04/19 1020)  busPIRone (BUSPAR) tablet 10 mg (10 mg Oral Given 12/04/19 1020)  ipratropium-albuterol (DUONEB) 0.5-2.5 (3) MG/3ML nebulizer solution 3 mL (has no administration in time range)  folic acid (FOLVITE) tablet 1 mg (1 mg Oral Given 12/04/19 1021)  doxycycline (VIBRA-TABS) tablet 100 mg (100 mg Oral Given 12/04/19 1020)  pantoprazole (PROTONIX) EC tablet 40 mg (40 mg Oral Given 12/04/19 1044)   potassium chloride SA (KLOR-CON) CR tablet 40 mEq (has no administration in time range)  sodium chloride 0.9 % bolus 1,000 mL (0 mLs Intravenous Stopped 12/03/19 1651)  potassium chloride SA (KLOR-CON) CR tablet 40 mEq (40 mEq Oral Given 12/03/19 1900)    Mobility walks Moderate fall risk   Focused Assessments Neuro Assessment Handoff:  Swallow screen pass? Yes  Cardiac Rhythm: Normal sinus rhythm NIH Stroke Scale ( + Modified Stroke Scale Criteria)  Interval: Shift assessment Level of Consciousness (1a.)   : Alert, keenly responsive LOC Questions (1b. )   +: Answers both questions correctly LOC Commands (1c. )   + : Performs both tasks correctly Best Gaze (2. )  +: Normal Visual (3. )  +: No visual loss Facial Palsy (4. )    : Normal symmetrical movements Motor Arm, Left (5a. )   +: No drift Motor Arm, Right (5b. )   +: No drift Motor Leg, Left (6a. )   +: No drift Motor Leg, Right (6b. )   +: No drift Limb Ataxia (7. ): Absent Sensory (8. )   +: Normal, no sensory loss Best Language (9. )   +: No aphasia Dysarthria (10. ): Normal Extinction/Inattention (11.)   +: No Abnormality Modified SS Total  +: 0 Complete NIHSS TOTAL: 0     Neuro Assessment: Within Defined Limits (pt still reports generalized weakness.) Neuro Checks:   Initial (12/04/19 0000)  Last Documented NIHSS Modified Score: 0 (12/04/19 0858) Has TPA been given? No   R Recommendations: See Admitting Provider Note  Report given to: Lafonda Mosses, RN  Additional Notes: pt lives with sister, contact number [phone]; attempted to call pt sister regarding room assignment. Sister did not pick up, voicemail with ED RN call back number provided.

## 2019-12-04 NOTE — [place] FL2 (Signed)
[place] MEDICAID FL2 LEVEL OF CARE SCREENING TOOL     IDENTIFICATION  Patient Name: ONEIL BEHNEY Birthdate: 10-01-1955 Sex: male Admission Date (Current Location): 12/03/2019  [place] and [place] Number:  [place] [facility] [id number] [place] and Address:  [facility] [address] [place]      Provider Number: [id number]  Attending Physician Name and Address:  Edwin Dada, *  Relative Name and Phone Number:  Ladon Applebaum (sister) Ph: [phone]    Current Level of Care: SNF Recommended Level of Care: [place] Prior Approval Number:    Date Approved/Denied:   PASRR Number:    Discharge Plan: SNF    Current Diagnoses: Patient Active Problem List   Diagnosis Date Noted  . Weakness 12/04/2019  . Ataxia 12/03/2019  . Hypokalemia 12/03/2019  . Macrocytic anemia 12/03/2019  . Cellulitis of left thumb 11/29/2019  . Mild tetrahydrocannabinol (THC) abuse 08/09/2019  . Controlled substance agreement signed 03/14/2018  . DDD (degenerative disc disease), cervical 08/24/2017  . Arthritis 07/29/2017  . Memory loss or impairment 07/08/2016  . Loss of weight 07/07/2013  . Frequent urination 11/04/2012  . Seizure disorder ([place]) 11/04/2012  . RLS (restless legs syndrome) 11/04/2012  . HLD (hyperlipidemia) 11/04/2012  . CAD (coronary artery disease) 11/04/2012  . HEAD TRAUMA, CLOSED 04/16/2009  . Hyperlipidemia 06/05/2008  . Generalized anxiety disorder 06/05/2008  . History of alcohol abuse 06/05/2008  . TOBACCO ABUSE 06/05/2008  . Depression, recurrent ([place]) 06/05/2008  . OBSTRUCTIVE SLEEP APNEA 06/05/2008  . Essential hypertension 06/05/2008  . CEREBRAL ANEURYSM 06/05/2008  . COPD (chronic obstructive pulmonary disease) ([place]) 06/05/2008  . GERD 06/05/2008  . HEADACHE, CHRONIC 06/05/2008    Orientation RESPIRATION BLADDER Height & Weight     Self, Time, Situation, Place  Normal Continent Weight: 130 lb 1.1  oz (59 kg) Height:  5\' 7"  (170.2 cm)  BEHAVIORAL SYMPTOMS/MOOD NEUROLOGICAL BOWEL NUTRITION STATUS    Convulsions/Seizures (Hx of seizure disorder) Continent Diet (Heart healthy)  AMBULATORY STATUS COMMUNICATION OF NEEDS Skin   Limited Assist Verbally Other (Comment) (Cellulitis)                       Personal Care Assistance Level of Assistance  Bathing, Dressing, Feeding Bathing Assistance: Limited assistance Feeding assistance: Independent Dressing Assistance: Limited assistance     Functional Limitations Info  Sight, Speech, Hearing Sight Info: Adequate Hearing Info: Adequate Speech Info: Adequate    SPECIAL CARE FACTORS FREQUENCY  PT (By licensed PT)     PT Frequency: 5x's/week              Contractures Contractures Info: Not present    Additional Factors Info  Code Status, Allergies, Psychotropic Code Status Info: Full Allergies Info: Clonidine Psychotropic Info: Paxil (paroxetine); Risperdal (risperidone); BuSpar (buspirone)         Current Medications (12/04/2019):  This is the current hospital active medication list Current Facility-Administered Medications  Medication Dose Route Frequency Provider Last Rate Last Admin  . acetaminophen (TYLENOL) tablet 650 mg  650 mg Oral Q6H PRN Reubin Milan, MD       Or  . acetaminophen (TYLENOL) suppository 650 mg  650 mg Rectal Q6H PRN Reubin Milan, MD      . aspirin EC tablet 81 mg  81 mg Oral Q breakfast Reubin Milan, MD   81 mg at 12/04/19 1020  . busPIRone (BUSPAR) tablet 10 mg  10 mg Oral TID Tennis Must  Kelby Fam, MD   10 mg at 12/04/19 1529  . cephALEXin (KEFLEX) capsule 500 mg  500 mg Oral TID Bobette Mo, MD   500 mg at 12/04/19 1529  . doxycycline (VIBRA-TABS) tablet 100 mg  100 mg Oral BID Bobette Mo, MD   100 mg at 12/04/19 1020  . folic acid (FOLVITE) tablet 1 mg  1 mg Oral Daily Bobette Mo, MD   1 mg at 12/04/19 1021  . ipratropium-albuterol (DUONEB)  0.5-2.5 (3) MG/3ML nebulizer solution 3 mL  3 mL Nebulization Q4H PRN Bobette Mo, MD      . LORazepam (ATIVAN) injection 0.5 mg  0.5 mg Intravenous Once PRN Bobette Mo, MD      . melatonin tablet 9 mg  9 mg Oral QHS Bobette Mo, MD      . mirabegron [facility]) tablet 25 mg  25 mg Oral Daily Bobette Mo, MD   25 mg at 12/04/19 1301  . multivitamin with minerals tablet 1 tablet  1 tablet Oral Daily Bobette Mo, MD   1 tablet at 12/04/19 1044  . mupirocin ointment (BACTROBAN) 2 % 1 application  1 application Topical BID Bobette Mo, MD   1 application at 12/04/19 1044  . niacin CR capsule 1,000 mg  1,000 mg Oral QHS Bobette Mo, MD      . nicotine (NICODERM CQ - dosed in mg/24 hours) patch 14 mg  14 mg Transdermal Daily Danford, Earl Lites, MD      . ondansetron [facility]) tablet 4 mg  4 mg Oral Q6H PRN Bobette Mo, MD       Or  . ondansetron [facility]) injection 4 mg  4 mg Intravenous Q6H PRN Bobette Mo, MD      . pantoprazole (PROTONIX) EC tablet 40 mg  40 mg Oral Daily Bobette Mo, MD   40 mg at 12/04/19 1044  . PARoxetine (PAXIL) tablet 20 mg  20 mg Oral Daily Bobette Mo, MD   20 mg at 12/04/19 1301  . pramipexole (MIRAPEX) tablet 1 mg  1 mg Oral QHS Bobette Mo, MD   1 mg at 12/04/19 1300  . risperiDONE (RISPERDAL) tablet 4 mg  4 mg Oral QHS Bobette Mo, MD   4 mg at 12/03/19 2359  . simvastatin (ZOCOR) tablet 20 mg  20 mg Oral QHS Bobette Mo, MD   20 mg at 12/03/19 2358  . sodium chloride flush (NS) 0.9 % injection 3 mL  3 mL Intravenous Once Bobette Mo, MD      . thiamine tablet 100 mg  100 mg Oral Daily Bobette Mo, MD   100 mg at 12/04/19 1044     Discharge Medications: Please see discharge summary for a list of discharge medications.  Relevant Imaging Results:  Relevant Lab Results:   Additional Information SSN#: [id number]  Ewing Schlein,  LCSW

## 2019-12-04 NOTE — Plan of Care (Signed)
°  Problem: Acute Rehab PT Goals(only PT should resolve) Goal: Pt Will Go Supine/Side To Sit Outcome: Progressing Flowsheets (Taken 12/04/2019 1513) Pt will go Supine/Side to Sit:  with modified independence  Independently Goal: Patient Will Transfer Sit To/From Stand Outcome: Progressing Flowsheets (Taken 12/04/2019 1513) Patient will transfer sit to/from stand: with min guard assist Goal: Pt Will Transfer Bed To Chair/Chair To Bed Outcome: Progressing Flowsheets (Taken 12/04/2019 1513) Pt will Transfer Bed to Chair/Chair to Bed: min guard assist Goal: Pt Will Ambulate Outcome: Progressing Flowsheets (Taken 12/04/2019 1513) Pt will Ambulate:  100 feet  with min guard assist  with rolling walker   3:14 PM, 12/04/19 Ocie Bob, MPT Physical Therapist with [facility] [phone] office [phone] mobile phone

## 2019-12-04 NOTE — Evaluation (Signed)
Physical Therapy Evaluation Patient Details Name: [name] MRN: [id number] DOB: 1955-06-26 Today's Date: 12/04/2019   History of Present Illness  [name] is a 64 y.o. male with medical history significant of anxiety, depression, alcohol abuse, tobacco abuse, history of closed head injury, COPD, GERD, hyperlipidemia, hypertension, seizure disorder, sleep apnea who was recently discharged from the hospital due to cellulitis of the left thumb and is returning to the emergency department today after becoming very weak at home to the point that he is unable to assist with transfers from bed to chair or to go to the restroom.  He is oriented x2, answers simple questions, but is unable to contribute to significantly to HPI.  His sister states that she is the only 1 with him at home.  She states that he has become so weak that her assistance is not enough to perform transfers safely.  She also reports that he has being at times increasingly confused, has some slurred speech earlier in the day and may be a facial droop.  To her knowledge, he has not had a fever, dyspnea, chest pain, abdominal pain, nausea, vomiting, diarrhea or constipation.  No changes in appetite and sleep.    Clinical Impression  Patient at high risk for falls due to unsteady labored shuffling gait, had to use RW for safety, limited for ambulation due to fatigue/generalized weakness and put back to bed with his sister present in room after therapy.  Patient will benefit from continued physical therapy in hospital and recommended venue below to increase strength, balance, endurance for safe ADLs and gait.    Follow Up Recommendations SNF    Equipment Recommendations  None recommended by PT    Recommendations for Other Services       Precautions / Restrictions Precautions Precautions: Fall Restrictions Weight Bearing Restrictions: No      Mobility  Bed Mobility Overal bed mobility: Needs Assistance Bed  Mobility: Supine to Sit;Sit to Supine     Supine to sit: Supervision Sit to supine: Supervision      Transfers Overall transfer level: Needs assistance Equipment used: Rolling walker (2 wheeled);None Transfers: Sit to/from [employer] to Stand: Min assist Stand pivot transfers: Min assist       General transfer comment: very unsteady on feet, requires the use of RW for safety  Ambulation/Gait Ambulation/Gait assistance: Min assist Gait Distance (Feet): 50 Feet Assistive device: Rolling walker (2 wheeled) Gait Pattern/deviations: Decreased step length - right;Decreased step length - left;Decreased stride length;Ataxic;Narrow base of support;Shuffle Gait velocity: decreased   General Gait Details: short shuffling steps with ataxi like gait, unsafe when attempting without AD, had to use RW demonstrating slow labored cadence, limited mostly due to fatigue  Stairs            Wheelchair Mobility    Modified Rankin (Stroke Patients Only)       Balance Overall balance assessment: Needs assistance Sitting-balance support: No upper extremity supported;Feet supported Sitting balance-Leahy Scale: Good Sitting balance - Comments: seated at EOB   Standing balance support: During functional activity;No upper extremity supported Standing balance-Leahy Scale: Poor Standing balance comment: fair using RW                             Pertinent Vitals/Pain Pain Assessment: No/denies pain    Home Living Family/patient expects to be discharged to:: Private residence Living Arrangements: Other relatives Available Help at Discharge: Family;Available 24  hours/day Type of Home: Mobile home Home Access: Ramped entrance     Home Layout: One level Home Equipment: [place] - 2 wheels;Cane - single point;Shower seat      Prior Function Level of Independence: Needs assistance   Gait / Transfers Assistance Needed: household ambulator without  AD  ADL's / Homemaking Assistance Needed: assisted by family        Hand Dominance        Extremity/Trunk Assessment   Upper Extremity Assessment Upper Extremity Assessment: Generalized weakness    Lower Extremity Assessment Lower Extremity Assessment: Generalized weakness    Cervical / Trunk Assessment Cervical / Trunk Assessment: Normal  Communication   Communication: HOH  Cognition Arousal/Alertness: Awake/alert Behavior During Therapy: WFL for tasks assessed/performed Overall Cognitive Status: Within Functional Limits for tasks assessed                                        General Comments      Exercises     Assessment/Plan    PT Assessment Patient needs continued PT services  PT Problem List Decreased strength;Decreased activity tolerance;Decreased balance;Decreased mobility       PT Treatment Interventions Balance training;Gait training;Stair training;Functional mobility training;Therapeutic activities;Therapeutic exercise;Patient/family education    PT Goals (Current goals can be found in the Care Plan section)  Acute Rehab PT Goals Patient Stated Goal: return home with family to assist PT Goal Formulation: With patient Time For Goal Achievement: 12/18/19 Potential to Achieve Goals: Good    Frequency Min 3X/week   Barriers to discharge        Co-evaluation               AM-PAC PT "6 Clicks" Mobility  Outcome Measure Help needed turning from your back to your side while in a flat bed without using bedrails?: None Help needed moving from lying on your back to sitting on the side of a flat bed without using bedrails?: None Help needed moving to and from a bed to a chair (including a wheelchair)?: A Lot Help needed standing up from a chair using your arms (e.g., wheelchair or bedside chair)?: A Little Help needed to walk in hospital room?: A Lot Help needed climbing 3-5 steps with a railing? : A Lot 6 Click Score: 17     End of Session   Activity Tolerance: Patient tolerated treatment well;Patient limited by fatigue Patient left: in bed;with call bell/phone within reach;with family/visitor present Nurse Communication: Mobility status PT Visit Diagnosis: Unsteadiness on feet (R26.81);Other abnormalities of gait and mobility (R26.89);Muscle weakness (generalized) (M62.81)    Time: 0947-0962 PT Time Calculation (min) (ACUTE ONLY): 25 min   Charges:   PT Evaluation $PT Eval Moderate Complexity: 1 Mod PT Treatments $Therapeutic Activity: 23-37 mins        3:12 PM, 12/04/19 Lonell Grandchild, MPT Physical Therapist with [facility] 336 [phone] office [phone] mobile phone

## 2019-12-04 NOTE — ED Notes (Signed)
ECHO being performed at pt bedside.

## 2019-12-04 NOTE — Progress Notes (Signed)
*  PRELIMINARY RESULTS* Echocardiogram 2D Echocardiogram has been performed.  [name] 12/04/2019, 10:07 AM

## 2019-12-04 NOTE — Care Management (Signed)
RE: [name] DOB: Dec 16, 1955 Date: 12/04/2019 MUST ID: [id number]  To Whom It May Concern:  Please be advised that the above name patient will require a short-term nursing home stay--anticipated 30 days or less rehabilitation and strengthening. The plan is for return home.

## 2019-12-05 ENCOUNTER — Inpatient Hospital Stay (HOSPITAL_COMMUNITY): Payer: Medicare Other

## 2019-12-05 LAB — COMPREHENSIVE METABOLIC PANEL
ALT: 18 U/L (ref 0–44)
AST: 25 U/L (ref 15–41)
Albumin: 3.2 g/dL — ABNORMAL LOW (ref 3.5–5.0)
Alkaline Phosphatase: 59 U/L (ref 38–126)
Anion gap: 6 (ref 5–15)
BUN: 14 mg/dL (ref 8–23)
CO2: 26 mmol/L (ref 22–32)
Calcium: 9.1 mg/dL (ref 8.9–10.3)
Chloride: 104 mmol/L (ref 98–111)
Creatinine, Ser: 0.73 mg/dL (ref 0.61–1.24)
GFR calc Af Amer: >60 mL/min (ref >60)
GFR calc non Af Amer: >60 mL/min (ref >60)
Glucose, Bld: 117 mg/dL — ABNORMAL HIGH (ref 70–99)
Potassium: 3.9 mmol/L (ref 3.5–5.1)
Sodium: 136 mmol/L (ref 135–145)
Total Bilirubin: 0.8 mg/dL (ref 0.3–1.2)
Total Protein: 6.2 g/dL — ABNORMAL LOW (ref 6.5–8.1)

## 2019-12-05 LAB — FOLATE: Folate: 10.1 ng/mL (ref >5.9)

## 2019-12-05 LAB — RETICULOCYTES
Immature Retic Fract: 13.2 % (ref 2.3–15.9)
RBC.: 3.62 MIL/uL — ABNORMAL LOW (ref 4.22–5.81)
Retic Count, Absolute: 40.2 10*3/uL (ref 19.0–186.0)
Retic Ct Pct: 1.1 % (ref 0.4–3.1)

## 2019-12-05 LAB — CBC
HCT: 36.4 % — ABNORMAL LOW (ref 39.0–52.0)
Hemoglobin: 12 g/dL — ABNORMAL LOW (ref 13.0–17.0)
MCH: 33.1 pg (ref 26.0–34.0)
MCHC: 33 g/dL (ref 30.0–36.0)
MCV: 100.6 fL — ABNORMAL HIGH (ref 80.0–100.0)
Platelets: 246 10*3/uL (ref 150–400)
RBC: 3.62 MIL/uL — ABNORMAL LOW (ref 4.22–5.81)
RDW: 12.6 % (ref 11.5–15.5)
WBC: 7.9 10*3/uL (ref 4.0–10.5)
nRBC: 0 % (ref 0.0–0.2)

## 2019-12-05 LAB — TSH: TSH: 1.409 u[IU]/mL (ref 0.350–4.500)

## 2019-12-05 LAB — HEMOGLOBIN A1C
Hgb A1c MFr Bld: 5.9 % — ABNORMAL HIGH (ref 4.8–5.6)
Mean Plasma Glucose: 123 mg/dL

## 2019-12-05 LAB — VITAMIN B12: Vitamin B-12: 363 pg/mL (ref 180–914)

## 2019-12-05 NOTE — Progress Notes (Signed)
PROGRESS NOTE    [name]  ZOX:[id number] DOB: 1955-06-18 DOA: 12/03/2019 PCP: Dettinger, Elige Radon, MD      Brief Narrative:  [name] is a 64 y.o. M with hx COPD, HTN, seizures, depression, alcohol use, early dementia, and recent admission for left thumb sialitis who presented with progressive weakness.  Since discharge from the hospital, patient has gotten gradually weaker, unable to participate in transfers, go to the restroom by himself, or walk at all.  Finally family thought that he had some new confusion, slurred speech, and facial droop so they brought him to the ER.  In the ER, afebrile, blood pressure 98/61.  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  The hospital service were asked to evaluate for weakness, possible facial droop.        Assessment & Plan:  Slurred speech, stroke ruled out After patient's return home, family thought they noticed acute facial droop, slurred speech and altered mental status generally.    CT head on arrival here showed a nonspecific abnormality in the left lentiform.  Follow up MRI brain showed no evidence of stroke.  The general impression family give is of someone too generally weak to walk independently and too globally confused to toilet himself, not of someone who had a transient focal neurological deficit, and I do NOT believe we obtained a clear enough history to accurately diagnose this as a TIA, but rather that family and we are observing the residual encephalopthy of someone with history TBI and dementia after an acute illness (his cellulitis and hospitalization).   Carotid artery disease As above, at this point in time, this appears to be asymptomatic carotid disease -Recommend vascular surgery follow up after discharge with Dr. Lemar Livings   COPD without exacerbation  Hypertension BP normal off meds -Resume metoprolol  History of  seizures Not on AEDs, no seizures here  Depression Dementia -Continue Paxil, Risperdal, Buspar  Anemia, macrocytic New anemia, no clincal bleeding Probably from alcohol.  Retic index low, but B12, folate, TSH normal.    -Follow up peripheral smear path after discharge  Hypokalemia Resolved  Bladder dysfunction -Continue Myrbetriq  RLS -Continue Mirapex  GERD -Continue PPI   Recent cellulitis This appears to be resolving.  It occurred due to a burn on the left thumb. -Continue cephalexin and doxycycline until 6/25         Disposition: Status is: Inpatient  Remains inpatient appropriate because:Unsafe d/c plan   Dispo:  Patient From: Home  Planned Disposition: Skilled Nursing Facility  Expected discharge date: 12/06/19  Medically stable for discharge: Yes   Patient has had stroke ruled out.  Awaiting passr for discharge to SNF, planned soon.                    MDM: The below labs and imaging reports reviewed and summarized above.  Medication management as above.    DVT prophylaxis: SCDs Start: 12/03/19 2056  Code Status: FULL Family Communication: sister by phone    Consultants:     Procedures:   6/21 echo -- normal EF, normal valves  6/21 carotid US -- >70% stenosis bilaterally  Antimicrobials:    Keflex and doxycycline 6/18 >>  Culture data:              Subjective: No new fever, confusion, vomiting.  Pleasantly confused.  Generally weak.  No orthostasis.  No focal weakness, numbness, tingling.  No change in mentation, no speech disturbance  Objective: Vitals:   12/04/19 2042 12/04/19 2352 12/05/19 0426 12/05/19 0510  BP:  127/75 135/72 135/72  Pulse:  72 69 62  Resp:  18 18   Temp:  98 F (36.7 C) 97.7 F (36.5 C)   TempSrc:  Oral Oral   SpO2: 98% 97% 95% 97%  Weight:      Height:       No intake or output data in the 24 hours ending 12/05/19 1348 Filed Weights   12/03/19 1523  Weight: 59 kg     Examination: General appearance: Elderly adult male, sitting in bed, no acute distress, appears weak.     HEENT: Anicteric, throat pink, lids and lashes normal.  No nasal deformity, discharge, or epistaxis, lips moist, dentures in place, oropharynx moist, no oral lesions, hearing diminished Skin: Warm and dry, no suspicious rashes or lesions Cardiac: RRR, no murmurs, no lower extremity edema Respiratory: Regular rate and rhythm, lungs clear without rales or wheezes Abdomen: Abdomen soft without tenderness palpation or guarding.  No ascites or effusion. MSK: Clubbing bilaterally, no deformities or effusions of the large joints of the upper or lower extremities bilaterally, diminished muscle mass and tone. Neuro: Awake and alert, extraocular movements intact, moves upper and lower extremities with generalized weakness, but symmetric strength, speech fluent.  No facial droop. Psych: Attentive to my conversation, but judgment and insight appear to manage.  Affect pleasant.     Data Reviewed: I have personally reviewed following labs and imaging studies:  CBC: Recent Labs  Lab 11/29/19 1132 11/30/19 0913 12/03/19 1546 12/05/19 0500  WBC 10.9* 7.5 8.9 7.9  NEUTROABS 8.5*  --  6.4  --   HGB 12.4* 12.8* 10.7* 12.0*  HCT 38.1* 38.9* 32.3* 36.4*  MCV 101.3* 99.7 100.9* 100.6*  PLT 199 213 207 246   Basic Metabolic Panel: Recent Labs  Lab 11/29/19 1132 12/03/19 1546 12/04/19 0341 12/05/19 0500  NA 142 137  --  136  K 3.5 3.2*  --  3.9  CL 105 101  --  104  CO2 27 24  --  26  GLUCOSE 121* 100*  --  117*  BUN 22 16  --  14  CREATININE 0.77 0.68  --  0.73  CALCIUM 9.2 8.7*  --  9.1  MG  --   --  1.8  --    GFR: Estimated Creatinine Clearance: 76.8 mL/min (by C-G formula based on SCr of 0.73 mg/dL). Liver Function Tests: Recent Labs  Lab 12/03/19 1546 12/05/19 0500  AST 34 25  ALT 16 18  ALKPHOS 60 59  BILITOT 0.8 0.8  PROT 6.3* 6.2*  ALBUMIN 3.5 3.2*   No results  for input(s): LIPASE, AMYLASE in the last 168 hours. No results for input(s): AMMONIA in the last 168 hours. Coagulation Profile: No results for input(s): INR, PROTIME in the last 168 hours. Cardiac Enzymes: No results for input(s): CKTOTAL, CKMB, CKMBINDEX, TROPONINI in the last 168 hours. BNP (last 3 results) No results for input(s): PROBNP in the last 8760 hours. HbA1C: Recent Labs    12/04/19 0341  HGBA1C 5.9*   CBG: Recent Labs  Lab 12/03/19 1551  GLUCAP 98   Lipid Profile: Recent Labs    12/04/19 0341  CHOL 108  HDL 41  LDLCALC 57  TRIG 51  CHOLHDL 2.6   Thyroid Function Tests: Recent Labs    12/05/19 0500  TSH 1.409   Anemia Panel: Recent Labs  12/05/19 0500  VITAMINB12 363  FOLATE 10.1  RETICCTPCT 1.1   Urine analysis:    Component Value Date/Time   COLORURINE STRAW (A) 12/03/2019 1932   APPEARANCEUR CLEAR 12/03/2019 1932   APPEARANCEUR Clear 11/12/2017 1144   LABSPEC 1.004 (L) 12/03/2019 1932   PHURINE 7.0 12/03/2019 1932   GLUCOSEU NEGATIVE 12/03/2019 1932   HGBUR SMALL (A) 12/03/2019 1932   BILIRUBINUR NEGATIVE 12/03/2019 1932   BILIRUBINUR Negative 11/12/2017 1144   KETONESUR 5 (A) 12/03/2019 1932   PROTEINUR NEGATIVE 12/03/2019 1932   UROBILINOGEN negative 11/04/2012 1302   NITRITE NEGATIVE 12/03/2019 1932   LEUKOCYTESUR NEGATIVE 12/03/2019 1932   Sepsis Labs: (procalcitonin:4,lacticacidven:4)  ) Recent Results (from the past 240 hour(s))  Blood culture (routine x 2)     Status: None   Collection Time: 11/29/19 11:15 AM   Specimen: BLOOD RIGHT FOREARM  Result Value Ref Range Status   Specimen Description BLOOD RIGHT FOREARM DRAWN BY IV THERAPY  Final   Special Requests   Final    BOTTLES DRAWN AEROBIC AND ANAEROBIC Blood Culture results may not be optimal due to an inadequate volume of blood received in culture bottles   Culture   Final    NO GROWTH 5 DAYS Performed at [facility], [address], [place],  [place] [address]    Report Status 12/04/2019 FINAL  Final  Blood culture (routine x 2)     Status: None   Collection Time: 11/29/19 12:01 PM   Specimen: BLOOD LEFT ARM  Result Value Ref Range Status   Specimen Description BLOOD LEFT ARM  Final   Special Requests   Final    BOTTLES DRAWN AEROBIC ONLY Blood Culture adequate volume   Culture   Final    NO GROWTH 5 DAYS Performed at [facility], [address] [place], [place] [address]    Report Status 12/04/2019 FINAL  Final  SARS Coronavirus 2 by RT PCR (hospital order, performed in [facility] hospital lab) Nasopharyngeal Nasopharyngeal Swab     Status: None   Collection Time: 11/29/19 12:17 PM   Specimen: Nasopharyngeal Swab  Result Value Ref Range Status   SARS Coronavirus 2 NEGATIVE NEGATIVE Final    Comment: (NOTE) SARS-CoV-2 target nucleic acids are NOT DETECTED.  The SARS-CoV-2 RNA is generally detectable in upper and lower respiratory specimens during the acute phase of infection. The lowest concentration of SARS-CoV-2 viral copies this assay can detect is 250 copies / mL. A negative result does not preclude SARS-CoV-2 infection and should not be used as the sole basis for treatment or other patient management decisions.  A negative result may occur with improper specimen collection / handling, submission of specimen other than nasopharyngeal swab, presence of viral mutation(s) within the areas targeted by this assay, and inadequate number of viral copies (<250 copies / mL). A negative result must be combined with clinical observations, patient history, and epidemiological information.  Fact Sheet for Patients:   BoilerBrush.com.cy  Fact Sheet for Healthcare Providers: https://pope.com/  This test is not yet approved or  cleared by the [place] FDA and has been authorized for detection and/or diagnosis of SARS-CoV-2 by FDA under an Emergency Use Authorization (EUA).  This  EUA will remain in effect (meaning this test can be used) for the duration of the COVID-19 declaration under Section 564(b)(1) of the Act, 21 U.S.C. section 360bbb-3(b)(1), unless the authorization is terminated or revoked sooner.  Performed at [facility], [address], [place], [place] [address]  SARS Coronavirus 2 by RT PCR (hospital order, performed in [facility] hospital lab) Nasopharyngeal Nasopharyngeal Swab     Status: None   Collection Time: 12/03/19  9:50 PM   Specimen: Nasopharyngeal Swab  Result Value Ref Range Status   SARS Coronavirus 2 NEGATIVE NEGATIVE Final    Comment: (NOTE) SARS-CoV-2 target nucleic acids are NOT DETECTED.  The SARS-CoV-2 RNA is generally detectable in upper and lower respiratory specimens during the acute phase of infection. The lowest concentration of SARS-CoV-2 viral copies this assay can detect is 250 copies / mL. A negative result does not preclude SARS-CoV-2 infection and should not be used as the sole basis for treatment or other patient management decisions.  A negative result may occur with improper specimen collection / handling, submission of specimen other than nasopharyngeal swab, presence of viral mutation(s) within the areas targeted by this assay, and inadequate number of viral copies (<250 copies / mL). A negative result must be combined with clinical observations, patient history, and epidemiological information.  Fact Sheet for Patients:   BoilerBrush.com.cyhttps://www.fda.gov/media/136312/download  Fact Sheet for Healthcare Providers: https://pope.com/https://www.fda.gov/media/136313/download  This test is not yet approved or  cleared by the [place] FDA and has been authorized for detection and/or diagnosis of SARS-CoV-2 by FDA under an Emergency Use Authorization (EUA).  This EUA will remain in effect (meaning this test can be used) for the duration of the COVID-19 declaration under Section 564(b)(1) of the Act, 21 U.S.C. section 360bbb-3(b)(1),  unless the authorization is terminated or revoked sooner.  Performed at [facility], [address] [place], [place] [address]          Radiology Studies: CT Head Wo Contrast  Result Date: 12/03/2019 CLINICAL DATA:  64 year old male with ataxia. EXAM: CT HEAD WITHOUT CONTRAST TECHNIQUE: Contiguous axial images were obtained from the base of the skull through the vertex without intravenous contrast. COMPARISON:  Head CT dated 07/09/2019. FINDINGS: Brain: Moderate age-related atrophy and chronic microvascular ischemic changes. 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 A 1 cm hypodense focus in the left lentiform nucleus is age indeterminate. There is no acute intracranial hemorrhage. No mass effect or midline shift. No extra-axial fluid collection. Vascular: No hyperdense vessel or unexpected calcification. Skull: No acute calvarial pathology. Left parietal and left frontal calvarial burr holes. Sinuses/Orbits: Diffuse mucoperiosteal thickening of paranasal sinuses with partial opacification of the left maxillary sinus. The mastoid air cells are clear. Other: None IMPRESSION: 1. No acute intracranial hemorrhage. 2. Age-related atrophy and chronic microvascular ischemic changes. 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 3. A 1 cm hypodense focus in the left lentiform nucleus is age indeterminate but new since the prior CT. MRI may provide better evaluation if clinically indicated. Electronically Signed   By: Elgie CollardArash  Radparvar M.D.   On: 12/03/2019 19:59   CT Lumbar Spine Wo Contrast  Result Date: 12/03/2019 CLINICAL DATA:  Initial evaluation for low back pain for greater than 6 weeks. EXAM: CT LUMBAR SPINE WITHOUT CONTRAST TECHNIQUE: Multidetector CT imaging of the lumbar spine was performed without intravenous contrast administration. Multiplanar CT image reconstructions were also generated. COMPARISON:  None available. FINDINGS: Segmentation: Transitional lumbosacral anatomy with a  partially sacralized L5 vertebral body. Lowest well-formed disc space labeled L5-S1. Alignment: Trace retrolisthesis of L2 on L3, chronic and facet mediated. Alignment otherwise normal with preservation of the normal lumbar lordosis. Vertebrae: Vertebral body height well maintained without evidence for acute or chronic fracture. Visualized sacrum and pelvis intact. SI joints approximated and symmetric. No worrisome  lytic or blastic osseous lesions. Paraspinal and other soft tissues: Paraspinous soft tissues demonstrate no acute finding. Extensive aorto bi-iliac atherosclerotic disease noted. No aneurysm. Remainder the visualized visceral structures otherwise unremarkable. Disc levels: T12-L1: Normal interspace. Mild bilateral facet hypertrophy. No canal or foraminal stenosis. L1-2: Mild annular disc bulge. Superimposed left foraminal to extraforaminal disc protrusion (series 4, image 41). Finding closely approximates and could potentially affect the exiting left L1 nerve root. Mild facet and ligament flavum hypertrophy. No significant spinal stenosis. Foramina remain patent. L2-3: Trace retrolisthesis. Diffuse disc bulge, asymmetric to the right. Superimposed broad-based right foraminal to extraforaminal disc protrusion (series 4, image 57). Superimposed moderate facet and ligament flavum hypertrophy. No more than mild spinal stenosis. Moderate right worse than left L2 foraminal narrowing. L3-4: Mild diffuse disc bulge. Moderate facet and ligament flavum hypertrophy. Probable mild narrowing of the lateral recesses bilaterally. Moderate bilateral L3 foraminal stenosis. L4-5: Diffuse disc bulge, eccentric to the right. Right-sided reactive endplate changes with marginal endplate spurring. Superimposed small right paracentral to foraminal disc protrusion with slight superior angulation (series 4, image 87). Severe right worse than left facet hypertrophy. Resultant moderate right with mild left lateral recess stenosis  and foraminal narrowing. L5-S1: Transitional lumbosacral anatomy with partially sacralized L5 vertebral body. Mild intervertebral disc space narrowing without significant disc bulge. Moderate bilateral facet hypertrophy. No canal or foraminal stenosis. IMPRESSION: 1. No acute abnormality within the lumbar spine. 2. Multifactorial degenerative changes at L4-5 with resultant moderate right lateral recess and foraminal stenosis. Either the right L4 or descending L5 nerve roots could be affected. 3. Left foraminal to extraforaminal disc protrusion at L1-2, potentially affecting the exiting left L1 nerve root. 4. Moderate bilateral L2 and L3 foraminal stenosis based on disc bulging and facet hypertrophy. 5. Aortic Atherosclerosis (ICD10-I70.0). Electronically Signed   By: Jeannine Boga M.D.   On: 12/03/2019 20:24   MR BRAIN WO CONTRAST  Result Date: 12/05/2019 CLINICAL DATA:  Altered mental status EXAM: MRI HEAD WITHOUT CONTRAST TECHNIQUE: Multiplanar, multiecho pulse sequences of the brain and surrounding structures were obtained without intravenous contrast. COMPARISON:  2015 FINDINGS: Motion artifact is present. Brain: There is no acute infarction or intracranial hemorrhage. There is no intracranial mass, mass effect, or edema. There is no hydrocephalus or extra-axial fluid collection. There are chronic small vessel infarcts of the left basal ganglia, corona radiata, and left greater than right thalamus. There is wallerian degeneration along the left cerebral peduncle. Additional patchy and confluent T2 hyperintensity in the supratentorial white matter is nonspecific but probably reflects similar chronic microvascular ischemic change. Prominence of the ventricles and sulci reflects similar generalized parenchymal volume loss. Foci of susceptibility in the right superior frontal gyrus and along the left corona radiata compatible with chronic blood products. Vascular: Major vessel flow voids at the skull base  are preserved. Skull and upper cervical spine: Normal marrow signal is preserved. Sinuses/Orbits: Mild mucosal thickening.  Orbits are unremarkable. Other: Sella is unremarkable.  Mastoid air cells are clear. IMPRESSION: No evidence of acute infarction, hemorrhage, or mass. Stable chronic microvascular ischemic change and chronic small vessel infarcts. Electronically Signed   By: Macy Mis M.D.   On: 12/05/2019 10:45   US Carotid Bilateral (at [facility] and [facility] only)  Result Date: 12/04/2019 CLINICAL DATA:  Hypertension, coronary disease, hyperlipidemia EXAM: BILATERAL CAROTID DUPLEX ULTRASOUND TECHNIQUE: Pearline Cables scale imaging, color Doppler and duplex ultrasound were performed of bilateral carotid and vertebral arteries in the neck. COMPARISON:  None. FINDINGS: Criteria: Quantification of carotid  stenosis is based on velocity parameters that correlate the residual internal carotid diameter with NASCET-based stenosis levels, using the diameter of the distal internal carotid lumen as the denominator for stenosis measurement. The following velocity measurements were obtained: RIGHT ICA: 608/242 cm/sec CCA: 67/18 cm/sec SYSTOLIC ICA/CCA RATIO:  9.1 ECA: 481 cm/sec LEFT ICA: 308/61 cm/sec CCA: 124/22 cm/sec SYSTOLIC ICA/CCA RATIO:  2.5 ECA: 415 cm/sec RIGHT CAROTID ARTERY: Marked calcified bifurcation atherosclerosis involving the ICA and ECA origins. Right mid ICA velocity elevation measures up to 608/242 centimeters/second with focal aliasing and spectral broadening. Right ICA stenosis estimated at greater than 70%. RIGHT VERTEBRAL ARTERY:  Normal antegrade flow LEFT CAROTID ARTERY: Similar marked calcified bifurcation atherosclerosis. Left mid ICA velocity elevation measures up to 306/60 centimeters/second with mild turbulent flow. Left ICA stenosis also estimated at greater than 70% by ultrasound criteria. LEFT VERTEBRAL ARTERY:  Normal antegrade flow IMPRESSION: Bilateral ICA stenoses estimated at greater than 70%  by ultrasound criteria, more severe on the right. Normal patent antegrade vertebral flow bilaterally Electronically Signed   By: Judie Petit.  Shick M.D.   On: 12/04/2019 09:48   DG Chest Port 1 View  Result Date: 12/03/2019 CLINICAL DATA:  Cough.  Weakness.  Diffuse pain. EXAM: PORTABLE CHEST 1 VIEW COMPARISON:  11/07/2019 and 07/09/2019 FINDINGS: The heart size and pulmonary vascularity are normal. No infiltrates or effusions. Chronic slight accentuation of the interstitial markings bilaterally. No acute bone abnormality. IMPRESSION: No acute cardiopulmonary disease. Chronic interstitial lung disease. Electronically Signed   By: Francene Boyers M.D.   On: 12/03/2019 16:41   ECHOCARDIOGRAM COMPLETE  Result Date: 12/04/2019    ECHOCARDIOGRAM REPORT   Patient Name:   [name] Date of Exam: 12/04/2019 Medical Rec #:  [id number]           Height:       67.0 in Accession #:    [id number]          Weight:       130.1 lb Date of Birth:  1956/02/11           BSA:          1.684 m Patient Age:    63 years            BP:           133/76 mmHg Patient Gender: M                   HR:           85 bpm. [facility] Procedure: 2D Echo Indications:    TIA 435.9 / G45.9  History:        Patient has no prior history of Echocardiogram examinations.                 CAD, COPD; Risk Factors:Current Smoker, Dyslipidemia and                 Hypertension. ETOH, CEREBRAL ANEURYSM.  Sonographer:    [name] RDCS (AE) Referring Phys: [id number] DAVID MANUEL ORTIZ IMPRESSIONS  1. Left ventricular ejection fraction, by estimation, is 55 to 60%. The left ventricle has normal function. The left ventricle has no regional wall motion abnormalities. There is mild left ventricular hypertrophy. Left ventricular diastolic parameters were normal.  2. Right ventricular systolic function is normal. The right ventricular size is normal.  3. The mitral valve is normal in structure. Mild mitral valve regurgitation. No evidence of  mitral stenosis.  4. The aortic valve was not well visualized. Aortic valve regurgitation is not visualized. No aortic stenosis is present.  5. The inferior vena cava is normal in size with greater than 50% respiratory variability, suggesting right atrial pressure of 3 mmHg. FINDINGS  Left Ventricle: Left ventricular ejection fraction, by estimation, is 55 to 60%. The left ventricle has normal function. The left ventricle has no regional wall motion abnormalities. The left ventricular internal cavity size was normal in size. There is  mild left ventricular hypertrophy. Left ventricular diastolic parameters were normal. Right Ventricle: The right ventricular size is normal. No increase in right ventricular wall thickness. Right ventricular systolic function is normal. Left Atrium: Left atrial size was normal in size. Right Atrium: Right atrial size was normal in size. Pericardium: There is no evidence of pericardial effusion. Mitral Valve: The mitral valve is normal in structure. Mild mitral valve regurgitation. No evidence of mitral valve stenosis. Tricuspid Valve: The tricuspid valve is normal in structure. Tricuspid valve regurgitation is not demonstrated. No evidence of tricuspid stenosis. Aortic Valve: The aortic valve was not well visualized. . There is mild thickening and mild calcification of the aortic valve. Aortic valve regurgitation is not visualized. No aortic stenosis is present. Mild aortic valve annular calcification. There is mild thickening of the aortic valve. There is mild calcification of the aortic valve. Aortic valve mean gradient measures 2.7 mmHg. Aortic valve peak gradient measures 6.3 mmHg. Aortic valve area, by VTI measures 2.60 cm. Pulmonic Valve: The pulmonic valve was not well visualized. Pulmonic valve regurgitation is not visualized. No evidence of pulmonic stenosis. Aorta: The aortic root is normal in size and structure. Venous: The inferior vena cava is normal in size with greater  than 50% respiratory variability, suggesting right atrial pressure of 3 mmHg. IAS/Shunts: No atrial level shunt detected by color flow Doppler.  LEFT VENTRICLE PLAX 2D LVIDd:         4.57 cm  Diastology LVIDs:         3.55 cm  LV e' lateral:   9.25 cm/s LV PW:         1.26 cm  LV E/e' lateral: 6.9 LV IVS:        1.08 cm  LV e' medial:    7.72 cm/s LVOT diam:     2.00 cm  LV E/e' medial:  8.3 LV SV:         59 LV SV Index:   35 LVOT Area:     3.14 cm  RIGHT VENTRICLE RV S prime:     14.00 cm/s TAPSE (M-mode): 2.5 cm LEFT ATRIUM             Index       RIGHT ATRIUM           Index LA diam:        3.50 cm 2.08 cm/m  RA Area:     14.60 cm LA Vol (A2C):   66.4 ml 39.42 ml/m RA Volume:   39.30 ml  23.33 ml/m LA Vol (A4C):   32.5 ml 19.30 ml/m LA Biplane Vol: 47.8 ml 28.38 ml/m  AORTIC VALVE AV Area (Vmax):    2.31 cm AV Area (Vmean):   2.46 cm AV Area (VTI):     2.60 cm AV Vmax:           125.96 cm/s AV Vmean:          74.917 cm/s AV VTI:  0.227 m AV Peak Grad:      6.3 mmHg AV Mean Grad:      2.7 mmHg LVOT Vmax:         92.65 cm/s LVOT Vmean:        58.641 cm/s LVOT VTI:          0.188 m LVOT/AV VTI ratio: 0.83  AORTA Ao Root diam: 3.00 cm MITRAL VALVE MV Area (PHT): 3.99 cm    SHUNTS MV Decel Time: 190 msec    Systemic VTI:  0.19 m MV E velocity: 64.00 cm/s  Systemic Diam: 2.00 cm MV A velocity: 42.70 cm/s MV E/A ratio:  1.50 Dina Rich MD Electronically signed by Dina Rich MD Signature Date/Time: 12/04/2019/2:13:03 PM    Final         Scheduled Meds: . aspirin EC  81 mg Oral Q breakfast  . busPIRone  10 mg Oral TID  . cephALEXin  500 mg Oral TID  . doxycycline  100 mg Oral BID  . folic acid  1 mg Oral Daily  . melatonin  9 mg Oral QHS  . mirabegron ER  25 mg Oral Daily  . multivitamin with minerals  1 tablet Oral Daily  . mupirocin ointment  1 application Topical BID  . niacin  1,000 mg Oral QHS  . nicotine  14 mg Transdermal Daily  . pantoprazole  40 mg Oral Daily  .  PARoxetine  20 mg Oral Daily  . pramipexole  1 mg Oral QHS  . risperidone  4 mg Oral QHS  . simvastatin  20 mg Oral QHS  . sodium chloride flush  3 mL Intravenous Once  . thiamine  100 mg Oral Daily   Continuous Infusions:   LOS: 1 day    Time spent: 25 minutes    Alberteen Sam, MD [facility] 12/05/2019, 1:48 PM     Please page though AMION or Epic secure chat:  For [employer], Higher education careers adviser

## 2019-12-05 NOTE — Progress Notes (Signed)
Physical Therapy Treatment Patient Details Name: [name] MRN: [id number] DOB: 1955/11/17 Today's Date: 12/05/2019    History of Present Illness [name] is a 64 y.o. male with medical history significant of anxiety, depression, alcohol abuse, tobacco abuse, history of closed head injury, COPD, GERD, hyperlipidemia, hypertension, seizure disorder, sleep apnea who was recently discharged from the hospital due to cellulitis of the left thumb and is returning to the emergency department today after becoming very weak at home to the point that he is unable to assist with transfers from bed to chair or to go to the restroom.  He is oriented x2, answers simple questions, but is unable to contribute to significantly to HPI.  His sister states that she is the only 1 with him at home.  She states that he has become so weak that her assistance is not enough to perform transfers safely.  She also reports that he has being at times increasingly confused, has some slurred speech earlier in the day and may be a facial droop.  To her knowledge, he has not had a fever, dyspnea, chest pain, abdominal pain, nausea, vomiting, diarrhea or constipation.  No changes in appetite and sleep.    PT Comments    Pt initially pulls on RW to attempt to rise from bed requiring min A then demonstrates short, unsteady, shuffling ataxic like steps requiring rest break in sitting. Therapist educated pt on pushing with BUE to power up, then standing marching with RW support to improve foot clearance and improve gait. Pt attempts to ambulate in room 2nd time without AD, but agreeable to education and benefit of RW. Pt requires min A to steady and clear past obstacles with RW, able to stand in bathroom to void bladder, then return to bedside. Pt's gait varies from very short, unsteady ataxic-like steps to more step through pattern, but then returns to original gait despite cues. Pt tolerates remaining up in bedside chair  at EOS with BLE extended, call bell in hand and chair alarm on. Patient will benefit from continued physical therapy in hospital and recommendations below to increase strength, balance, endurance for safe ADLs and gait.    Follow Up Recommendations  SNF     Equipment Recommendations  None recommended by PT    Recommendations for Other Services       Precautions / Restrictions Precautions Precautions: Fall Restrictions Weight Bearing Restrictions: No    Mobility  Bed Mobility  General bed mobility comments: seated EOB upon arrival  Transfers Overall transfer level: Needs assistance Equipment used: Rolling walker (2 wheeled);None Transfers: Sit to/from [employer] to Stand: Min assist;Min guard Stand pivot transfers: Min guard  General transfer comment: STS min A initially, progressing to min G with good carryover of pushing with BUE to assist in powering up, slightly unsteady with legs braced on bed; min G to pivot over to bedside chair, cues for hand placement without AD to reduce risk for falls  Ambulation/Gait Ambulation/Gait assistance: Min assist Gait Distance (Feet): 30 Feet Assistive device: Rolling walker (2 wheeled) Gait Pattern/deviations: Decreased stride length;Narrow base of support;Shuffle Gait velocity: decreased   General Gait Details: ataxic-like unsteady, short, shuffling steps, progressing to more step through pattern but returns to shuffling, short steps with decreased heel-toe pattern, min A to maneuver RW safely around obstaclces, no LOB; slightly impulsive attempting to ambulate without AD initially, but agreeable with education   Stairs  Wheelchair Mobility    Modified Rankin (Stroke Patients Only)       Balance Overall balance assessment: Needs assistance Sitting-balance support: No upper extremity supported;Feet supported Sitting balance-Leahy Scale: Good Sitting balance - Comments: seated at EOB    Standing balance support: During functional activity;No upper extremity supported Standing balance-Leahy Scale: Poor Standing balance comment: reliant on RW for steadying         Cognition Arousal/Alertness: Awake/alert Behavior During Therapy: WFL for tasks assessed/performed Overall Cognitive Status: Within Functional Limits for tasks assessed               Exercises General Exercises - Lower Extremity Long Arc Quad: Seated;Both;15 reps Hip Flexion/Marching: Seated;Both;15 reps;Standing;10 reps Toe Raises: Seated;Both;15 reps Heel Raises: Seated;Both;15 reps    General Comments        Pertinent Vitals/Pain Pain Assessment: No/denies pain    Home Living                      Prior Function            PT Goals (current goals can now be found in the care plan section) Acute Rehab PT Goals Patient Stated Goal: return home with family to assist PT Goal Formulation: With patient Time For Goal Achievement: 12/18/19 Potential to Achieve Goals: Good Progress towards PT goals: Progressing toward goals    Frequency    Min 3X/week      PT Plan Current plan remains appropriate    Co-evaluation              AM-PAC PT "6 Clicks" Mobility   Outcome Measure  Help needed turning from your back to your side while in a flat bed without using bedrails?: None Help needed moving from lying on your back to sitting on the side of a flat bed without using bedrails?: None Help needed moving to and from a bed to a chair (including a wheelchair)?: A Little Help needed standing up from a chair using your arms (e.g., wheelchair or bedside chair)?: A Little Help needed to walk in hospital room?: A Lot Help needed climbing 3-5 steps with a railing? : A Lot 6 Click Score: 18    End of Session Equipment Utilized During Treatment: Gait belt Activity Tolerance: Patient tolerated treatment well Patient left: in chair;with call bell/phone within reach;with chair alarm  set Nurse Communication: Mobility status PT Visit Diagnosis: Unsteadiness on feet (R26.81);Other abnormalities of gait and mobility (R26.89);Muscle weakness (generalized) (M62.81)     Time: 8295-6213 PT Time Calculation (min) (ACUTE ONLY): 23 min  Charges:  $Gait Training: 8-22 mins $Therapeutic Exercise: 8-22 mins                     Talbot Grumbling PT, DPT 12/05/19, 11:44 AM [phone]

## 2019-12-05 NOTE — Evaluation (Signed)
Speech Language Pathology Evaluation Patient Details Name: [name] MRN: [id number] DOB: [birth year] Today's Date: 12/05/2019 Time: 4098-1191 SLP Time Calculation (min) (ACUTE ONLY): 26 min  Problem List:  Patient Active Problem List   Diagnosis Date Noted   Weakness 12/04/2019   Ataxia 12/03/2019   Hypokalemia 12/03/2019   Macrocytic anemia 12/03/2019   Cellulitis of left thumb 11/29/2019   Mild tetrahydrocannabinol (THC) abuse 08/09/2019   Controlled substance agreement signed 03/14/2018   DDD (degenerative disc disease), cervical 08/24/2017   Arthritis 07/29/2017   Memory loss or impairment 07/08/2016   Loss of weight 07/07/2013   Frequent urination 11/04/2012   Seizure disorder ([place]) 11/04/2012   RLS (restless legs syndrome) 11/04/2012   HLD (hyperlipidemia) 11/04/2012   CAD (coronary artery disease) 11/04/2012   HEAD TRAUMA, CLOSED 04/16/2009   Hyperlipidemia 06/05/2008   Generalized anxiety disorder 06/05/2008   History of alcohol abuse 06/05/2008   TOBACCO ABUSE 06/05/2008   Depression, recurrent ([place]) 06/05/2008   OBSTRUCTIVE SLEEP APNEA 06/05/2008   Essential hypertension 06/05/2008   CEREBRAL ANEURYSM 06/05/2008   COPD (chronic obstructive pulmonary disease) ([place]) 06/05/2008   GERD 06/05/2008   HEADACHE, CHRONIC 06/05/2008   Past Medical History:  Past Medical History:  Diagnosis Date   Anxiety    Cerebral aneurysm    Closed head injury    COPD (chronic obstructive pulmonary disease) ([place])    Depression    GERD (gastroesophageal reflux disease)    Hyperlipidemia    Hypertension    Seizures ([place])    Sleep apnea    Past Surgical History: History reviewed. No pertinent surgical history. HPI:  Mr. Tanori is a 64 y.o. M with hx COPD, HTN, seizures, depression, alcohol use, early dementia, and recent admission for left thumb cellulitis who presented with progressive weakness. Since discharge from the  hospital, patient has gotten gradually weaker, unable to participate in transfers, go to the restroom by himself, or walk at all.  Finally family thought that he had some new confusion, slurred speech, and facial droop so they brought him to the ER. In the ER, afebrile, blood pressure 98/61.  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  The hospital service were asked to evaluate for weakness, possible facial droop. 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 There is wallerian degeneration along the left cerebral peduncle, otherwise no acute changes. SLE requested.   Assessment / Plan / Recommendation Clinical Impression  Pt presents with mild cognitive linguistic deficits characterized by reduced working and short term Buyer, retail which negatively impact independence at home (his sister has been providing assistance at home for some time, however she indicates that he "is worse" now) and mild/moderate dysarthria negatively impacted by edentulous status. His sister seems to understand him well, but also indicates that his speech has deteriorated in the past month. SLP frequently had to request clarification during conversational speech. Pt benefited from setting the context prior to discussions as well as reminders to decrease rate, over articulate, and pause between phrases. Recommend f/u SLP services to address the above deficits at the next venue of care (SNF) and no further acute SLP needs identified at this time.     SLP Assessment  SLP Recommendation/Assessment: All further Speech Lanaguage Pathology  needs can be addressed in the next venue of care SLP Visit Diagnosis: Dysarthria and anarthria (R47.1);Cognitive communication deficit (  R41.841)    Follow Up Recommendations  Skilled Nursing facility    Frequency and Duration           SLP  Evaluation Cognition  Overall Cognitive Status: History of cognitive impairments - at baseline Arousal/Alertness: Awake/alert Orientation Level: Oriented X4 Memory: Impaired Memory Impairment: Decreased short term memory Decreased Short Term Memory: Verbal complex Awareness: Appears intact Problem Solving: Appears intact Executive Function: Decision Making (defers to sister for most answers first) Decision Making: Impaired Decision Making Impairment: Verbal complex Safety/Judgment: Appears intact       Comprehension  Auditory Comprehension Overall Auditory Comprehension: Impaired Yes/No Questions: Within Functional Limits Commands: Impaired Multistep Basic Commands: 50-74% accurate Conversation: Simple Interfering Components: Hearing;Processing speed EffectiveTechniques: Extra processing time;Increased volume;Repetition;Stressing words Visual Recognition/Discrimination Discrimination: Not tested Reading Comprehension Reading Status: Not tested    Expression Expression Primary Mode of Expression: Verbal Verbal Expression Overall Verbal Expression: Impaired Initiation: No impairment Automatic Speech: Name;Social Response;Day of week Level of Generative/Spontaneous Verbalization: Conversation Repetition: Impaired Level of Impairment: Sentence level Naming: No impairment Pragmatics: No impairment Interfering Components: Speech intelligibility Effective Techniques: Articulatory cues Non-Verbal Means of Communication: Not applicable Written Expression Written Expression: Not tested   Oral / Motor  Motor Speech Overall Motor Speech: Impaired Respiration: Impaired Level of Impairment: Sentence Phonation: Normal Resonance: Within functional limits Articulation: Impaired Level of Impairment: Sentence Intelligibility: Intelligibility reduced Word: 75-100% accurate Phrase: 75-100% accurate Sentence: 75-100% accurate Conversation: 75-100% accurate Motor Planning: Witnin  functional limits Motor Speech Errors: Aware;Unaware Interfering Components: Inadequate dentition Effective Techniques: Slow rate;Increased vocal intensity;Over-articulate;Pause   Thank you,  Havery Moros, CCC-SLP [phone]                     Elicia Lui 12/05/2019, 3:48 PM

## 2019-12-05 NOTE — Progress Notes (Signed)
PT Cancellation Note  Patient Details Name: [name] MRN: [id number] DOB: Feb 21, 1956   Cancelled Treatment:    Reason Eval/Treat Not Completed: Patient at procedure or test/unavailable. Pt at x-ray, will check back as schedule permits.    Tori Josseline Reddin PT, DPT 12/05/19, 10:07 AM [phone]

## 2019-12-05 NOTE — Progress Notes (Signed)
OT Cancellation Note  Patient Details Name: [name] MRN: [id number] DOB: 07/14/1955   Cancelled Treatment:    Reason Eval/Treat Not Completed: Other (comment). Eating breakfast. Will return as schedule allows. Thank you.  Minnah Llamas M Kailer Heindel Eldena Dede MSOT, OTR/L Acute Rehab Pager: [phone]  12/05/2019, 8:30 AM

## 2019-12-05 NOTE — TOC Progression Note (Addendum)
Transition of Care [facility]) - Progression Note   Patient Details  Name: [name] MRN: [id number] Date of Birth: 1955/11/22  [facility] CM/SW Contact  Ewing Schlein, LCSW Phone Number: 12/05/2019, 4:07 PM  Clinical Narrative: CSW spoke with sister regarding bed offers. [facility] will not have availability until 12/08/19 and patient was declined for [facility]. Sister agreeable to CSW faxing out patient to SNFs in [place]. Patient received bed offer from [facility]. Sister agreeable to [facility]. CSW spoke with Georgina Pillion. Patient will need a new COVID test and the facility will need a copy of his vaccine card for COVID if he has been vaccinated. CSW attempted to reach sister, but was unable to reach her. CSW left voicemail requesting call back about patient's COVID vaccine status. [facility] number is still pending and Tammi with [employer] is aware. CSW requested COVID test from [facility] RN.   Expected Discharge Plan: Skilled Nursing Facility Barriers to Discharge: Continued Medical Work up, Awaiting State Approval Financial controller)  Expected Discharge Plan and Services Expected Discharge Plan: Skilled Nursing Facility In-house Referral: Clinical Social Work Post Acute Care Choice: Skilled Nursing Facility Living arrangements for the past 2 months: Single Family Home  Readmission Risk Interventions Readmission Risk Prevention Plan 12/04/2019  Transportation Screening Complete  Medication Review Oceanographer) Complete  PCP or Specialist appointment within 3-5 days of discharge Not Complete  PCP/Specialist Appt Not Complete comments Patient discharging to SNF for rehab  HRI or Home Care Consult Complete  SW Recovery Care/Counseling Consult Complete  Palliative Care Screening Not Applicable  Skilled Nursing Facility Complete  Some recent data might be hidden

## 2019-12-06 DIAGNOSIS — J439 Emphysema, unspecified: Secondary | ICD-10-CM

## 2019-12-06 DIAGNOSIS — E782 Mixed hyperlipidemia: Secondary | ICD-10-CM

## 2019-12-06 DIAGNOSIS — I10 Essential (primary) hypertension: Secondary | ICD-10-CM

## 2019-12-06 DIAGNOSIS — L03012 Cellulitis of left finger: Secondary | ICD-10-CM

## 2019-12-06 LAB — SARS CORONAVIRUS 2 (TAT 6-24 HRS): SARS Coronavirus 2: NEGATIVE

## 2019-12-06 LAB — PATHOLOGIST SMEAR REVIEW

## 2019-12-06 MED ORDER — CEPHALEXIN 500 MG PO CAPS: 500.0000 mg | ORAL_CAPSULE | Freq: Three times a day (TID) | ORAL | 0 refills | Status: DC

## 2019-12-06 MED ORDER — DOXYCYCLINE HYCLATE 100 MG PO TABS: 100.0000 mg | ORAL_TABLET | Freq: Two times a day (BID) | ORAL | 0 refills | Status: DC

## 2019-12-06 NOTE — Discharge Summary (Signed)
Physician Discharge Summary  [name]:[id number] DOB: 1956/02/17 DOA: 12/03/2019  PCP: Louanne Skye Elige Radon, MD  Admit date: 12/03/2019 Discharge date: 12/06/2019  Admitted From: Home Disposition: Skilled nursing facility  Recommendations for Outpatient Follow-up:  1. Follow up with PCP in 1-2 weeks 2. Please obtain BMP/CBC in one week  Discharge Condition: Stable CODE STATUS: Full code Diet recommendation: Heart healthy  Brief/Interim Summary: [name] is a 64 y.o. M with hx COPD, HTN, seizures, depression, alcohol use, early dementia, and recent admission for left thumb sialitis who presented with progressive weakness.  Since discharge from the hospital, patient has gotten gradually weaker, unable to participate in transfers, go to the restroom by himself, or walk at all.  Finally family thought that he had some new confusion, slurred speech, and facial droop so they brought him to the ER.  In the ER, afebrile, blood pressure 98/61.  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  The hospital service were asked to evaluate for weakness, possible facial droop.  Discharge Diagnoses:  Principal Problem:   Ataxia Active Problems:   Hyperlipidemia   Generalized anxiety disorder   Essential hypertension   COPD (chronic obstructive pulmonary disease) (HCC)   GERD   CAD (coronary artery disease)   Cellulitis of left thumb   Hypokalemia   Macrocytic anemia   Weakness  Slurred speech, stroke ruled out After patient's return home, family thought they noticed acute facial droop, slurred speech and altered mental status generally.    CT head on arrival here showed a nonspecific abnormality in the left lentiform.  Follow up MRI brain showed no evidence of stroke.  The general impression family give is of someone too generally weak to walk independently and too globally confused to toilet himself, not of  someone who had a transient focal neurological deficit, and I do NOT believe we obtained a clear enough history to accurately diagnose this as a TIA, but rather that family and we are observing the residual encephalopthy of someone with history TBI and dementia after an acute illness (his cellulitis and hospitalization).   Carotid artery disease As above, at this point in time, this appears to be asymptomatic carotid disease -Recommend vascular surgery follow up after discharge with Dr. Lemar Livings   COPD without exacerbation  Hypertension BP normal off meds -Resume metoprolol on discharge  History of seizures Not on AEDs, no seizures here  Depression Dementia -Continue Paxil, Risperdal, Buspar  Anemia, macrocytic New anemia, no clincal bleeding Probably from alcohol.  Retic index low, but B12, folate, TSH normal.    -Follow up peripheral smear path after discharge  Hypokalemia Resolved  Bladder dysfunction -Continue Myrbetriq  RLS -Continue Mirapex  GERD -Continue PPI   Recent cellulitis This appears to be resolving.  It occurred due to a burn on the left thumb. -Continue cephalexin and doxycycline until 6/25  Discharge Instructions  Discharge Instructions    Diet - low sodium heart healthy   Complete by: As directed    Increase activity slowly   Complete by: As directed      Allergies as of 12/06/2019      Reactions   Clonidine Rash      Medication List    TAKE these medications   acetaminophen 325 MG tablet Commonly known as: TYLENOL Take 2 tablets (650 mg total) by mouth every 6 (six) hours as needed for mild pain (or Fever >/= 101).  albuterol 108 (90 Base) MCG/ACT inhaler Commonly known as: VENTOLIN HFA 2 PUFFS EVERY 6 HOURS AS NEEDED FOR WHEEZING OR SHORTNESS OF BREATH What changed: See the new instructions.   aspirin EC 81 MG tablet Take 1 tablet (81 mg total) by mouth daily with breakfast.   busPIRone 10 MG tablet Commonly  known as: BUSPAR Take 10 mg by mouth 3 (three) times daily.   cephALEXin 500 MG capsule Commonly known as: Keflex Take 1 capsule (500 mg total) by mouth 3 (three) times daily. Until 6/25 What changed: additional instructions   doxycycline 100 MG tablet Commonly known as: VIBRA-TABS Take 1 tablet (100 mg total) by mouth 2 (two) times daily. Until 6/25 What changed: additional instructions   esomeprazole 40 MG capsule Commonly known as: NEXIUM TAKE (1) CAPSULE DAILY What changed: See the new instructions.   folic acid 1 MG tablet Commonly known as: FOLVITE Take 1 tablet (1 mg total) by mouth daily.   ipratropium-albuterol 0.5-2.5 (3) MG/3ML Soln Commonly known as: DUONEB Take 3 mLs by nebulization every 4 (four) hours as needed. What changed: reasons to take this   Melatonin 10 MG Tabs Take 10 mg by mouth at bedtime.   metoprolol succinate 25 MG 24 hr tablet Commonly known as: TOPROL-XL Take 1 tablet (25 mg total) by mouth daily.   mirabegron ER 25 MG Tb24 tablet Commonly known as: Myrbetriq Take 1 tablet (25 mg total) by mouth daily.   multivitamin with minerals Tabs tablet Take 1 tablet by mouth daily.   mupirocin ointment 2 % Commonly known as: BACTROBAN PLACE 1 APPLICATION INTO THE NOSE 2 TIMES A DAY What changed:   how much to take  how to take this  when to take this  additional instructions   niacin 1000 MG CR tablet Commonly known as: NIASPAN TAKE 1 TABLET AT BEDTIME   PARoxetine 20 MG tablet Commonly known as: Paxil Take 1 tablet (20 mg total) by mouth daily.   potassium chloride 10 MEQ tablet Commonly known as: KLOR-CON Take 1 tablet (10 mEq total) by mouth daily.   pramipexole 0.5 MG tablet Commonly known as: MIRAPEX Take 2 tablets (1 mg total) by mouth at bedtime.   risperidone 4 MG tablet Commonly known as: RISPERDAL Take 1 tablet (4 mg total) by mouth at bedtime.   simvastatin 20 MG tablet Commonly known as: ZOCOR Take 1 tablet  (20 mg total) by mouth at bedtime.   thiamine 100 MG tablet Take 1 tablet (100 mg total) by mouth daily.       Follow-up Information    Dettinger, Elige Radon, MD. Schedule an appointment as soon as possible for a visit in 1 week(s).   Specialties: Family Medicine, Cardiology Contact information: [address] [place] [address] [phone]        Maeola Harman, MD Follow up.   Specialties: Vascular Surgery, Cardiology Why: Make an appointment in 4-6 months for follow up atherosclerosis in neck Contact information: [address] [place] [address] [phone]              Allergies  Allergen Reactions  . Clonidine Rash    Consultations:     Procedures/Studies: DG Chest 2 View  Result Date: 11/07/2019 CLINICAL DATA:  COPD, weight loss EXAM: CHEST - 2 VIEW COMPARISON:  07/09/2019 FINDINGS: Frontal and lateral views of the chest demonstrate a stable cardiac silhouette. No airspace disease, effusion, or pneumothorax. Stable emphysema. No acute bony abnormalities. IMPRESSION: 1. Emphysema, no acute  airspace disease. Electronically Signed   By: Sharlet Salina M.D.   On: 11/07/2019 23:57   CT Head Wo Contrast  Result Date: 12/03/2019 CLINICAL DATA:  64 year old male with ataxia. EXAM: CT HEAD WITHOUT CONTRAST TECHNIQUE: Contiguous axial images were obtained from the base of the skull through the vertex without intravenous contrast. COMPARISON:  Head CT dated 07/09/2019. FINDINGS: Brain: Moderate age-related atrophy and chronic microvascular ischemic changes. 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 A 1 cm hypodense focus in the left lentiform nucleus is age indeterminate. There is no acute intracranial hemorrhage. No mass effect or midline shift. No extra-axial fluid collection. Vascular: No hyperdense vessel or unexpected calcification. Skull: No acute calvarial pathology. Left parietal and left frontal calvarial burr holes. Sinuses/Orbits: Diffuse  mucoperiosteal thickening of paranasal sinuses with partial opacification of the left maxillary sinus. The mastoid air cells are clear. Other: None IMPRESSION: 1. No acute intracranial hemorrhage. 2. Age-related atrophy and chronic microvascular ischemic changes. 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 3. A 1 cm hypodense focus in the left lentiform nucleus is age indeterminate but new since the prior CT. MRI may provide better evaluation if clinically indicated. Electronically Signed   By: Elgie Collard M.D.   On: 12/03/2019 19:59   CT Lumbar Spine Wo Contrast  Result Date: 12/03/2019 CLINICAL DATA:  Initial evaluation for low back pain for greater than 6 weeks. EXAM: CT LUMBAR SPINE WITHOUT CONTRAST TECHNIQUE: Multidetector CT imaging of the lumbar spine was performed without intravenous contrast administration. Multiplanar CT image reconstructions were also generated. COMPARISON:  None available. FINDINGS: Segmentation: Transitional lumbosacral anatomy with a partially sacralized L5 vertebral body. Lowest well-formed disc space labeled L5-S1. Alignment: Trace retrolisthesis of L2 on L3, chronic and facet mediated. Alignment otherwise normal with preservation of the normal lumbar lordosis. Vertebrae: Vertebral body height well maintained without evidence for acute or chronic fracture. Visualized sacrum and pelvis intact. SI joints approximated and symmetric. No worrisome lytic or blastic osseous lesions. Paraspinal and other soft tissues: Paraspinous soft tissues demonstrate no acute finding. Extensive aorto bi-iliac atherosclerotic disease noted. No aneurysm. Remainder the visualized visceral structures otherwise unremarkable. Disc levels: T12-L1: Normal interspace. Mild bilateral facet hypertrophy. No canal or foraminal stenosis. L1-2: Mild annular disc bulge. Superimposed left foraminal to extraforaminal disc protrusion (series 4, image 41). Finding closely approximates and could potentially  affect the exiting left L1 nerve root. Mild facet and ligament flavum hypertrophy. No significant spinal stenosis. Foramina remain patent. L2-3: Trace retrolisthesis. Diffuse disc bulge, asymmetric to the right. Superimposed broad-based right foraminal to extraforaminal disc protrusion (series 4, image 57). Superimposed moderate facet and ligament flavum hypertrophy. No more than mild spinal stenosis. Moderate right worse than left L2 foraminal narrowing. L3-4: Mild diffuse disc bulge. Moderate facet and ligament flavum hypertrophy. Probable mild narrowing of the lateral recesses bilaterally. Moderate bilateral L3 foraminal stenosis. L4-5: Diffuse disc bulge, eccentric to the right. Right-sided reactive endplate changes with marginal endplate spurring. Superimposed small right paracentral to foraminal disc protrusion with slight superior angulation (series 4, image 87). Severe right worse than left facet hypertrophy. Resultant moderate right with mild left lateral recess stenosis and foraminal narrowing. L5-S1: Transitional lumbosacral anatomy with partially sacralized L5 vertebral body. Mild intervertebral disc space narrowing without significant disc bulge. Moderate bilateral facet hypertrophy. No canal or foraminal stenosis. IMPRESSION: 1. No acute abnormality within the lumbar spine. 2. Multifactorial degenerative changes at L4-5 with resultant moderate right lateral recess and foraminal stenosis. Either the right L4 or descending L5 nerve  roots could be affected. 3. Left foraminal to extraforaminal disc protrusion at L1-2, potentially affecting the exiting left L1 nerve root. 4. Moderate bilateral L2 and L3 foraminal stenosis based on disc bulging and facet hypertrophy. 5. Aortic Atherosclerosis (ICD10-I70.0). Electronically Signed   By: Rise Mu M.D.   On: 12/03/2019 20:24   MR BRAIN WO CONTRAST  Result Date: 12/05/2019 CLINICAL DATA:  Altered mental status EXAM: MRI HEAD WITHOUT CONTRAST  TECHNIQUE: Multiplanar, multiecho pulse sequences of the brain and surrounding structures were obtained without intravenous contrast. COMPARISON:  2015 FINDINGS: Motion artifact is present. Brain: There is no acute infarction or intracranial hemorrhage. There is no intracranial mass, mass effect, or edema. There is no hydrocephalus or extra-axial fluid collection. There are chronic small vessel infarcts of the left basal ganglia, corona radiata, and left greater than right thalamus. There is wallerian degeneration along the left cerebral peduncle. Additional patchy and confluent T2 hyperintensity in the supratentorial white matter is nonspecific but probably reflects similar chronic microvascular ischemic change. Prominence of the ventricles and sulci reflects similar generalized parenchymal volume loss. Foci of susceptibility in the right superior frontal gyrus and along the left corona radiata compatible with chronic blood products. Vascular: Major vessel flow voids at the skull base are preserved. Skull and upper cervical spine: Normal marrow signal is preserved. Sinuses/Orbits: Mild mucosal thickening.  Orbits are unremarkable. Other: Sella is unremarkable.  Mastoid air cells are clear. IMPRESSION: No evidence of acute infarction, hemorrhage, or mass. Stable chronic microvascular ischemic change and chronic small vessel infarcts. Electronically Signed   By: Guadlupe Spanish M.D.   On: 12/05/2019 10:45   DG Hand 2 View Left  Result Date: 11/29/2019 CLINICAL DATA:  Pt has a burn to left thumb with redness a swelling going into wrist EXAM: LEFT HAND - 2 VIEW COMPARISON:  None. FINDINGS: There is no evidence of fracture or dislocation. There is no evidence of arthropathy or other focal bone abnormality. There is soft tissue swelling about the thumb. IMPRESSION: No acute osseous abnormality in the left hand. Electronically Signed   By: Emmaline Kluver M.D.   On: 11/29/2019 11:51   US Carotid Bilateral (at [facility]  and [facility] only)  Result Date: 12/04/2019 CLINICAL DATA:  Hypertension, coronary disease, hyperlipidemia EXAM: BILATERAL CAROTID DUPLEX ULTRASOUND TECHNIQUE: Wallace Cullens scale imaging, color Doppler and duplex ultrasound were performed of bilateral carotid and vertebral arteries in the neck. COMPARISON:  None. FINDINGS: Criteria: Quantification of carotid stenosis is based on velocity parameters that correlate the residual internal carotid diameter with NASCET-based stenosis levels, using the diameter of the distal internal carotid lumen as the denominator for stenosis measurement. The following velocity measurements were obtained: RIGHT ICA: 608/242 cm/sec CCA: 67/18 cm/sec SYSTOLIC ICA/CCA RATIO:  9.1 ECA: 481 cm/sec LEFT ICA: 308/61 cm/sec CCA: 124/22 cm/sec SYSTOLIC ICA/CCA RATIO:  2.5 ECA: 415 cm/sec RIGHT CAROTID ARTERY: Marked calcified bifurcation atherosclerosis involving the ICA and ECA origins. Right mid ICA velocity elevation measures up to 608/242 centimeters/second with focal aliasing and spectral broadening. Right ICA stenosis estimated at greater than 70%. RIGHT VERTEBRAL ARTERY:  Normal antegrade flow LEFT CAROTID ARTERY: Similar marked calcified bifurcation atherosclerosis. Left mid ICA velocity elevation measures up to 306/60 centimeters/second with mild turbulent flow. Left ICA stenosis also estimated at greater than 70% by ultrasound criteria. LEFT VERTEBRAL ARTERY:  Normal antegrade flow IMPRESSION: Bilateral ICA stenoses estimated at greater than 70% by ultrasound criteria, more severe on the right. Normal patent antegrade vertebral flow bilaterally Electronically Signed  By: Osvaldo Shipper M.D.   On: 12/04/2019 09:48   DG Chest Port 1 View  Result Date: 12/03/2019 CLINICAL DATA:  Cough.  Weakness.  Diffuse pain. EXAM: PORTABLE CHEST 1 VIEW COMPARISON:  11/07/2019 and 07/09/2019 FINDINGS: The heart size and pulmonary vascularity are normal. No infiltrates or effusions. Chronic slight accentuation of  the interstitial markings bilaterally. No acute bone abnormality. IMPRESSION: No acute cardiopulmonary disease. Chronic interstitial lung disease. Electronically Signed   By: Francene Boyers M.D.   On: 12/03/2019 16:41   ECHOCARDIOGRAM COMPLETE  Result Date: 12/04/2019    ECHOCARDIOGRAM REPORT   Patient Name:   JONMICHAEL BEADNELL Date of Exam: 12/04/2019 Medical Rec #:  [id number]           Height:       67.0 in Accession #:    [id number]          Weight:       130.1 lb Date of Birth:  09/03/1955           BSA:          1.684 m Patient Age:    63 years            BP:           133/76 mmHg Patient Gender: M                   HR:           85 bpm. [facility] Procedure: 2D Echo Indications:    TIA 435.9 / G45.9  History:        Patient has no prior history of Echocardiogram examinations.                 CAD, COPD; Risk Factors:Current Smoker, Dyslipidemia and                 Hypertension. ETOH, CEREBRAL ANEURYSM.  Sonographer:    [name] RDCS (AE) Referring Phys: [id number] DAVID MANUEL ORTIZ IMPRESSIONS  1. Left ventricular ejection fraction, by estimation, is 55 to 60%. The left ventricle has normal function. The left ventricle has no regional wall motion abnormalities. There is mild left ventricular hypertrophy. Left ventricular diastolic parameters were normal.  2. Right ventricular systolic function is normal. The right ventricular size is normal.  3. The mitral valve is normal in structure. Mild mitral valve regurgitation. No evidence of mitral stenosis.  4. The aortic valve was not well visualized. Aortic valve regurgitation is not visualized. No aortic stenosis is present.  5. The inferior vena cava is normal in size with greater than 50% respiratory variability, suggesting right atrial pressure of 3 mmHg. FINDINGS  Left Ventricle: Left ventricular ejection fraction, by estimation, is 55 to 60%. The left ventricle has normal function. The left ventricle has no regional wall motion  abnormalities. The left ventricular internal cavity size was normal in size. There is  mild left ventricular hypertrophy. Left ventricular diastolic parameters were normal. Right Ventricle: The right ventricular size is normal. No increase in right ventricular wall thickness. Right ventricular systolic function is normal. Left Atrium: Left atrial size was normal in size. Right Atrium: Right atrial size was normal in size. Pericardium: There is no evidence of pericardial effusion. Mitral Valve: The mitral valve is normal in structure. Mild mitral valve regurgitation. No evidence of mitral valve stenosis. Tricuspid Valve: The tricuspid valve is normal in structure. Tricuspid valve regurgitation is not demonstrated. No evidence of tricuspid stenosis.  Aortic Valve: The aortic valve was not well visualized. . There is mild thickening and mild calcification of the aortic valve. Aortic valve regurgitation is not visualized. No aortic stenosis is present. Mild aortic valve annular calcification. There is mild thickening of the aortic valve. There is mild calcification of the aortic valve. Aortic valve mean gradient measures 2.7 mmHg. Aortic valve peak gradient measures 6.3 mmHg. Aortic valve area, by VTI measures 2.60 cm. Pulmonic Valve: The pulmonic valve was not well visualized. Pulmonic valve regurgitation is not visualized. No evidence of pulmonic stenosis. Aorta: The aortic root is normal in size and structure. Venous: The inferior vena cava is normal in size with greater than 50% respiratory variability, suggesting right atrial pressure of 3 mmHg. IAS/Shunts: No atrial level shunt detected by color flow Doppler.  LEFT VENTRICLE PLAX 2D LVIDd:         4.57 cm  Diastology LVIDs:         3.55 cm  LV e' lateral:   9.25 cm/s LV PW:         1.26 cm  LV E/e' lateral: 6.9 LV IVS:        1.08 cm  LV e' medial:    7.72 cm/s LVOT diam:     2.00 cm  LV E/e' medial:  8.3 LV SV:         59 LV SV Index:   35 LVOT Area:     3.14 cm   RIGHT VENTRICLE RV S prime:     14.00 cm/s TAPSE (M-mode): 2.5 cm LEFT ATRIUM             Index       RIGHT ATRIUM           Index LA diam:        3.50 cm 2.08 cm/m  RA Area:     14.60 cm LA Vol (A2C):   66.4 ml 39.42 ml/m RA Volume:   39.30 ml  23.33 ml/m LA Vol (A4C):   32.5 ml 19.30 ml/m LA Biplane Vol: 47.8 ml 28.38 ml/m  AORTIC VALVE AV Area (Vmax):    2.31 cm AV Area (Vmean):   2.46 cm AV Area (VTI):     2.60 cm AV Vmax:           125.96 cm/s AV Vmean:          74.917 cm/s AV VTI:            0.227 m AV Peak Grad:      6.3 mmHg AV Mean Grad:      2.7 mmHg LVOT Vmax:         92.65 cm/s LVOT Vmean:        58.641 cm/s LVOT VTI:          0.188 m LVOT/AV VTI ratio: 0.83  AORTA Ao Root diam: 3.00 cm MITRAL VALVE MV Area (PHT): 3.99 cm    SHUNTS MV Decel Time: 190 msec    Systemic VTI:  0.19 m MV E velocity: 64.00 cm/s  Systemic Diam: 2.00 cm MV A velocity: 42.70 cm/s MV E/A ratio:  1.50 Dina Rich MD Electronically signed by Dina Rich MD Signature Date/Time: 12/04/2019/2:13:03 PM    Final        Subjective:   Discharge Exam: Vitals:   12/05/19 2022 12/05/19 2139 12/06/19 0559 12/06/19 1336  BP:  (!) 141/69 (!) 124/59 110/65  Pulse:  72 78 86  Resp:  18 18 17   Temp:  98.1 F (36.7 C) 98 F (36.7 C) (!) 97.4 F (36.3 C)  TempSrc:  Oral Oral Oral  SpO2: 99% 98% 94% 100%  Weight:      Height:        General: Pt is alert, awake, not in acute distress Cardiovascular: RRR, S1/S2 +, no rubs, no gallops Respiratory: CTA bilaterally, no wheezing, no rhonchi Abdominal: Soft, NT, ND, bowel sounds + Extremities: no edema, no cyanosis    The results of significant diagnostics from this hospitalization (including imaging, microbiology, ancillary and laboratory) are listed below for reference.     Microbiology: Recent Results (from the past 240 hour(s))  Blood culture (routine x 2)     Status: None   Collection Time: 11/29/19 11:15 AM   Specimen: BLOOD RIGHT FOREARM   Result Value Ref Range Status   Specimen Description BLOOD RIGHT FOREARM DRAWN BY IV THERAPY  Final   Special Requests   Final    BOTTLES DRAWN AEROBIC AND ANAEROBIC Blood Culture results may not be optimal due to an inadequate volume of blood received in culture bottles   Culture   Final    NO GROWTH 5 DAYS Performed at [facility], [address], [place], [place] [address]    Report Status 12/04/2019 FINAL  Final  Blood culture (routine x 2)     Status: None   Collection Time: 11/29/19 12:01 PM   Specimen: BLOOD LEFT ARM  Result Value Ref Range Status   Specimen Description BLOOD LEFT ARM  Final   Special Requests   Final    BOTTLES DRAWN AEROBIC ONLY Blood Culture adequate volume   Culture   Final    NO GROWTH 5 DAYS Performed at [facility], [address], [place], [place] [address]    Report Status 12/04/2019 FINAL  Final  SARS Coronavirus 2 by RT PCR (hospital order, performed in [facility] hospital lab) Nasopharyngeal Nasopharyngeal Swab     Status: None   Collection Time: 11/29/19 12:17 PM   Specimen: Nasopharyngeal Swab  Result Value Ref Range Status   SARS Coronavirus 2 NEGATIVE NEGATIVE Final    Comment: (NOTE) SARS-CoV-2 target nucleic acids are NOT DETECTED.  The SARS-CoV-2 RNA is generally detectable in upper and lower respiratory specimens during the acute phase of infection. The lowest concentration of SARS-CoV-2 viral copies this assay can detect is 250 copies / mL. A negative result does not preclude SARS-CoV-2 infection and should not be used as the sole basis for treatment or other patient management decisions.  A negative result may occur with improper specimen collection / handling, submission of specimen other than nasopharyngeal swab, presence of viral mutation(s) within the areas targeted by this assay, and inadequate number of viral copies (<250 copies / mL). A negative result must be combined with clinical observations, patient history, and  epidemiological information.  Fact Sheet for Patients:   StrictlyIdeas.no  Fact Sheet for Healthcare Providers: BankingDealers.co.za  This test is not yet approved or  cleared by the [place] FDA and has been authorized for detection and/or diagnosis of SARS-CoV-2 by FDA under an Emergency Use Authorization (EUA).  This EUA will remain in effect (meaning this test can be used) for the duration of the COVID-19 declaration under Section 564(b)(1) of the Act, 21 U.S.C. section 360bbb-3(b)(1), unless the authorization is terminated or revoked sooner.  Performed at [facility], [address] [place], [place] [address]   SARS Coronavirus 2 by RT PCR (hospital order, performed in [facility]  hospital lab) Nasopharyngeal Nasopharyngeal Swab     Status: None   Collection Time: 12/03/19  9:50 PM   Specimen: Nasopharyngeal Swab  Result Value Ref Range Status   SARS Coronavirus 2 NEGATIVE NEGATIVE Final    Comment: (NOTE) SARS-CoV-2 target nucleic acids are NOT DETECTED.  The SARS-CoV-2 RNA is generally detectable in upper and lower respiratory specimens during the acute phase of infection. The lowest concentration of SARS-CoV-2 viral copies this assay can detect is 250 copies / mL. A negative result does not preclude SARS-CoV-2 infection and should not be used as the sole basis for treatment or other patient management decisions.  A negative result may occur with improper specimen collection / handling, submission of specimen other than nasopharyngeal swab, presence of viral mutation(s) within the areas targeted by this assay, and inadequate number of viral copies (<250 copies / mL). A negative result must be combined with clinical observations, patient history, and epidemiological information.  Fact Sheet for Patients:   BoilerBrush.com.cyhttps://www.fda.gov/media/136312/download  Fact Sheet for Healthcare  Providers: https://pope.com/https://www.fda.gov/media/136313/download  This test is not yet approved or  cleared by the [place] FDA and has been authorized for detection and/or diagnosis of SARS-CoV-2 by FDA under an Emergency Use Authorization (EUA).  This EUA will remain in effect (meaning this test can be used) for the duration of the COVID-19 declaration under Section 564(b)(1) of the Act, 21 U.S.C. section 360bbb-3(b)(1), unless the authorization is terminated or revoked sooner.  Performed at [facility], [address] [place], [place] [address]   SARS CORONAVIRUS 2 (TAT 6-24 HRS) Nasopharyngeal Nasopharyngeal Swab     Status: None   Collection Time: 12/05/19  3:59 PM   Specimen: Nasopharyngeal Swab  Result Value Ref Range Status   SARS Coronavirus 2 NEGATIVE NEGATIVE Final    Comment: (NOTE) SARS-CoV-2 target nucleic acids are NOT DETECTED.  The SARS-CoV-2 RNA is generally detectable in upper and lower respiratory specimens during the acute phase of infection. Negative results do not preclude SARS-CoV-2 infection, do not rule out co-infections with other pathogens, and should not be used as the sole basis for treatment or other patient management decisions. Negative results must be combined with clinical observations, patient history, and epidemiological information. The expected result is Negative.  Fact Sheet for Patients: HairSlick.nohttps://www.fda.gov/media/138098/download  Fact Sheet for Healthcare Providers: quierodirigir.comhttps://www.fda.gov/media/138095/download  This test is not yet approved or cleared by the [place] FDA and  has been authorized for detection and/or diagnosis of SARS-CoV-2 by FDA under an Emergency Use Authorization (EUA). This EUA will remain  in effect (meaning this test can be used) for the duration of the COVID-19 declaration under Se ction 564(b)(1) of the Act, 21 U.S.C. section 360bbb-3(b)(1), unless the authorization is terminated or revoked sooner.  Performed at  [facility], [address] [place], [place] [address]      Labs: BNP (last 3 results) No results for input(s): BNP in the last 8760 hours. Basic Metabolic Panel: Recent Labs  Lab 12/03/19 1546 12/04/19 0341 12/05/19 0500  NA 137  --  136  K 3.2*  --  3.9  CL 101  --  104  CO2 24  --  26  GLUCOSE 100*  --  117*  BUN 16  --  14  CREATININE 0.68  --  0.73  CALCIUM 8.7*  --  9.1  MG  --  1.8  --    Liver Function Tests: Recent Labs  Lab 12/03/19 1546 12/05/19 0500  AST 34  25  ALT 16 18  ALKPHOS 60 59  BILITOT 0.8 0.8  PROT 6.3* 6.2*  ALBUMIN 3.5 3.2*   No results for input(s): LIPASE, AMYLASE in the last 168 hours. No results for input(s): AMMONIA in the last 168 hours. CBC: Recent Labs  Lab 11/30/19 0913 12/03/19 1546 12/05/19 0500  WBC 7.5 8.9 7.9  NEUTROABS  --  6.4  --   HGB 12.8* 10.7* 12.0*  HCT 38.9* 32.3* 36.4*  MCV 99.7 100.9* 100.6*  PLT 213 207 246   Cardiac Enzymes: No results for input(s): CKTOTAL, CKMB, CKMBINDEX, TROPONINI in the last 168 hours. BNP: Invalid input(s): POCBNP CBG: Recent Labs  Lab 12/03/19 1551  GLUCAP 98   D-Dimer No results for input(s): DDIMER in the last 72 hours. Hgb A1c Recent Labs    12/04/19 0341  HGBA1C 5.9*   Lipid Profile Recent Labs    12/04/19 0341  CHOL 108  HDL 41  LDLCALC 57  TRIG 51  CHOLHDL 2.6   Thyroid function studies Recent Labs    12/05/19 0500  TSH 1.409   Anemia work up Recent Labs    12/05/19 0500  VITAMINB12 363  FOLATE 10.1  RETICCTPCT 1.1   Urinalysis    Component Value Date/Time   COLORURINE STRAW (A) 12/03/2019 1932   APPEARANCEUR CLEAR 12/03/2019 1932   APPEARANCEUR Clear 11/12/2017 1144   LABSPEC 1.004 (L) 12/03/2019 1932   PHURINE 7.0 12/03/2019 1932   GLUCOSEU NEGATIVE 12/03/2019 1932   HGBUR SMALL (A) 12/03/2019 1932   BILIRUBINUR NEGATIVE 12/03/2019 1932   BILIRUBINUR Negative 11/12/2017 1144   KETONESUR 5 (A) 12/03/2019 1932   PROTEINUR  NEGATIVE 12/03/2019 1932   UROBILINOGEN negative 11/04/2012 1302   NITRITE NEGATIVE 12/03/2019 1932   LEUKOCYTESUR NEGATIVE 12/03/2019 1932   Sepsis Labs Invalid input(s): PROCALCITONIN,  WBC,  LACTICIDVEN Microbiology Recent Results (from the past 240 hour(s))  Blood culture (routine x 2)     Status: None   Collection Time: 11/29/19 11:15 AM   Specimen: BLOOD RIGHT FOREARM  Result Value Ref Range Status   Specimen Description BLOOD RIGHT FOREARM DRAWN BY IV THERAPY  Final   Special Requests   Final    BOTTLES DRAWN AEROBIC AND ANAEROBIC Blood Culture results may not be optimal due to an inadequate volume of blood received in culture bottles   Culture   Final    NO GROWTH 5 DAYS Performed at [facility], [address], [place], [place] [address]    Report Status 12/04/2019 FINAL  Final  Blood culture (routine x 2)     Status: None   Collection Time: 11/29/19 12:01 PM   Specimen: BLOOD LEFT ARM  Result Value Ref Range Status   Specimen Description BLOOD LEFT ARM  Final   Special Requests   Final    BOTTLES DRAWN AEROBIC ONLY Blood Culture adequate volume   Culture   Final    NO GROWTH 5 DAYS Performed at [facility], [address] [place], [place] [address]    Report Status 12/04/2019 FINAL  Final  SARS Coronavirus 2 by RT PCR (hospital order, performed in [facility] hospital lab) Nasopharyngeal Nasopharyngeal Swab     Status: None   Collection Time: 11/29/19 12:17 PM   Specimen: Nasopharyngeal Swab  Result Value Ref Range Status   SARS Coronavirus 2 NEGATIVE NEGATIVE Final    Comment: (NOTE) SARS-CoV-2 target nucleic acids are NOT DETECTED.  The SARS-CoV-2 RNA is generally detectable in upper and lower respiratory specimens  during the acute phase of infection. The lowest concentration of SARS-CoV-2 viral copies this assay can detect is 250 copies / mL. A negative result does not preclude SARS-CoV-2 infection and should not be used as the sole basis for treatment or  other patient management decisions.  A negative result may occur with improper specimen collection / handling, submission of specimen other than nasopharyngeal swab, presence of viral mutation(s) within the areas targeted by this assay, and inadequate number of viral copies (<250 copies / mL). A negative result must be combined with clinical observations, patient history, and epidemiological information.  Fact Sheet for Patients:   BoilerBrush.com.cy  Fact Sheet for Healthcare Providers: https://pope.com/  This test is not yet approved or  cleared by the [place] FDA and has been authorized for detection and/or diagnosis of SARS-CoV-2 by FDA under an Emergency Use Authorization (EUA).  This EUA will remain in effect (meaning this test can be used) for the duration of the COVID-19 declaration under Section 564(b)(1) of the Act, 21 U.S.C. section 360bbb-3(b)(1), unless the authorization is terminated or revoked sooner.  Performed at [facility], [address] [place], [place] [address]   SARS Coronavirus 2 by RT PCR (hospital order, performed in [facility] hospital lab) Nasopharyngeal Nasopharyngeal Swab     Status: None   Collection Time: 12/03/19  9:50 PM   Specimen: Nasopharyngeal Swab  Result Value Ref Range Status   SARS Coronavirus 2 NEGATIVE NEGATIVE Final    Comment: (NOTE) SARS-CoV-2 target nucleic acids are NOT DETECTED.  The SARS-CoV-2 RNA is generally detectable in upper and lower respiratory specimens during the acute phase of infection. The lowest concentration of SARS-CoV-2 viral copies this assay can detect is 250 copies / mL. A negative result does not preclude SARS-CoV-2 infection and should not be used as the sole basis for treatment or other patient management decisions.  A negative result may occur with improper specimen collection / handling, submission of specimen other than nasopharyngeal swab,  presence of viral mutation(s) within the areas targeted by this assay, and inadequate number of viral copies (<250 copies / mL). A negative result must be combined with clinical observations, patient history, and epidemiological information.  Fact Sheet for Patients:   BoilerBrush.com.cy  Fact Sheet for Healthcare Providers: https://pope.com/  This test is not yet approved or  cleared by the [place] FDA and has been authorized for detection and/or diagnosis of SARS-CoV-2 by FDA under an Emergency Use Authorization (EUA).  This EUA will remain in effect (meaning this test can be used) for the duration of the COVID-19 declaration under Section 564(b)(1) of the Act, 21 U.S.C. section 360bbb-3(b)(1), unless the authorization is terminated or revoked sooner.  Performed at [facility], [address], [place], [place] [address]   SARS CORONAVIRUS 2 (TAT 6-24 HRS) Nasopharyngeal Nasopharyngeal Swab     Status: None   Collection Time: 12/05/19  3:59 PM   Specimen: Nasopharyngeal Swab  Result Value Ref Range Status   SARS Coronavirus 2 NEGATIVE NEGATIVE Final    Comment: (NOTE) SARS-CoV-2 target nucleic acids are NOT DETECTED.  The SARS-CoV-2 RNA is generally detectable in upper and lower respiratory specimens during the acute phase of infection. Negative results do not preclude SARS-CoV-2 infection, do not rule out co-infections with other pathogens, and should not be used as the sole basis for treatment or other patient management decisions. Negative results must be combined with clinical observations, patient history, and epidemiological information. The expected result is Negative.  Fact Sheet for Patients: HairSlick.no  Fact Sheet for Healthcare Providers: quierodirigir.com  This test is not yet approved or cleared by the [place] FDA and  has been authorized for  detection and/or diagnosis of SARS-CoV-2 by FDA under an Emergency Use Authorization (EUA). This EUA will remain  in effect (meaning this test can be used) for the duration of the COVID-19 declaration under Se ction 564(b)(1) of the Act, 21 U.S.C. section 360bbb-3(b)(1), unless the authorization is terminated or revoked sooner.  Performed at [facility], [address], [place], [place] [address]      Time coordinating discharge:  SIGNED:   Erick Blinks, MD  [facility] 12/06/2019, 3:09 PM   If 7PM-7AM, please contact night-coverage [email or web address]

## 2019-12-06 NOTE — Care Management Important Message (Signed)
Important Message  Patient Details  Name: [name] MRN: [id number] Date of Birth: 08-22-1955   Medicare Important Message Given:  Yes     Corey Harold 12/06/2019, 11:18 AM

## 2019-12-06 NOTE — TOC Transition Note (Signed)
Transition of Care [facility]) - CM/SW Discharge Note   Patient Details  Name: [name] MRN: [id number] Date of Birth: 08-18-1955  Transition of Care [facility]) CM/SW Contact:  Annice Needy, LCSW Phone Number: 12/06/2019, 3:18 PM   Clinical Narrative:    Babette Relic at [facility] notified of discharge. Discharge clinicals sent. RN to call report    Final next level of care: Skilled Nursing Facility Barriers to Discharge: No Barriers Identified   Patient Goals and CMS Choice Patient states their goals for this hospitalization and ongoing recovery are:: Discharge to SNF for rehab CMS Medicare.gov Compare Post Acute Care list provided to:: Patient Choice offered to / list presented to : Patient, Sibling  Discharge Placement PASRR number recieved: 12/06/19            Patient chooses bed at: [facility] Patient to be transferred to facility by: sister, Francoise Schaumann Name of family member notified: Francoise Schaumann Patient and family notified of of transfer: 12/06/19  Discharge Plan and Services In-house Referral: Clinical Social Work   Post Acute Care Choice: Skilled Nursing Facility                               Social Determinants of Health (SDOH) Interventions     Readmission Risk Interventions Readmission Risk Prevention Plan 12/04/2019  Transportation Screening Complete  Medication Review [facility]) Complete  PCP or Specialist appointment within 3-5 days of discharge Not Complete  PCP/Specialist Appt Not Complete comments Patient discharging to SNF for rehab  HRI or Home Care Consult Complete  SW Recovery Care/Counseling Consult Complete  Palliative Care Screening Not Applicable  Skilled Nursing Facility Complete  Some recent data might be hidden

## 2019-12-06 NOTE — Social Work (Addendum)
New FL2 faxed to [place] per request. PASRR number received: [id number] E.  Memory Argue, LCSW Transitions of Care Clinical Social Worker [facility] Ph: [phone]

## 2019-12-06 NOTE — [place] FL2 (Signed)
[place] MEDICAID FL2 LEVEL OF CARE SCREENING TOOL     IDENTIFICATION  Patient Name: [name] Birthdate: [birth year] Sex: male Admission Date (Current Location): 12/03/2019  [place] and [place] Number:  [place] [facility] [id number] [place] and Address:  [place] [address], [place]      Provider Number: [id number]  Attending Physician Name and Address:  Kathie Dike, MD  Relative Name and Phone Number:  Ladon Applebaum (sister) Ph: [phone]    Current Level of Care: Hospital Recommended Level of Care: [place] Prior Approval Number:    Date Approved/Denied:   PASRR Number:    Discharge Plan: SNF    Current Diagnoses: Patient Active Problem List   Diagnosis Date Noted  . Weakness 12/04/2019  . Ataxia 12/03/2019  . Hypokalemia 12/03/2019  . Macrocytic anemia 12/03/2019  . Cellulitis of left thumb 11/29/2019  . Mild tetrahydrocannabinol (THC) abuse 08/09/2019  . Controlled substance agreement signed 03/14/2018  . DDD (degenerative disc disease), cervical 08/24/2017  . Arthritis 07/29/2017  . Memory loss or impairment 07/08/2016  . Loss of weight 07/07/2013  . Frequent urination 11/04/2012  . Seizure disorder ([place]) 11/04/2012  . RLS (restless legs syndrome) 11/04/2012  . HLD (hyperlipidemia) 11/04/2012  . CAD (coronary artery disease) 11/04/2012  . HEAD TRAUMA, CLOSED 04/16/2009  . Hyperlipidemia 06/05/2008  . Generalized anxiety disorder 06/05/2008  . History of alcohol abuse 06/05/2008  . TOBACCO ABUSE 06/05/2008  . Depression, recurrent ([place]) 06/05/2008  . OBSTRUCTIVE SLEEP APNEA 06/05/2008  . Essential hypertension 06/05/2008  . CEREBRAL ANEURYSM 06/05/2008  . COPD (chronic obstructive pulmonary disease) ([place]) 06/05/2008  . GERD 06/05/2008  . HEADACHE, CHRONIC 06/05/2008    Orientation RESPIRATION BLADDER Height & Weight     Self, Time, Situation, Place  Normal Continent Weight: 130 lb 1.1 oz  (59 kg) Height:  5\' 7"  (170.2 cm)  BEHAVIORAL SYMPTOMS/MOOD NEUROLOGICAL BOWEL NUTRITION STATUS    Convulsions/Seizures (Hx of seizure disorder) Continent Diet (Heart healthy)  AMBULATORY STATUS COMMUNICATION OF NEEDS Skin   Limited Assist Verbally Other (Comment) (Cellulitis)                       Personal Care Assistance Level of Assistance  Bathing, Dressing, Feeding Bathing Assistance: Limited assistance Feeding assistance: Independent Dressing Assistance: Limited assistance     Functional Limitations Info  Sight, Speech, Hearing Sight Info: Adequate Hearing Info: Adequate Speech Info: Adequate    SPECIAL CARE FACTORS FREQUENCY  PT (By licensed PT)     PT Frequency: 5x's/week              Contractures Contractures Info: Not present    Additional Factors Info  Code Status, Allergies, Psychotropic Code Status Info: Full Allergies Info: Clonidine Psychotropic Info: Paxil (paroxetine); Risperdal (risperidone); BuSpar (buspirone)         Current Medications (12/06/2019):  This is the current hospital active medication list Current Facility-Administered Medications  Medication Dose Route Frequency Provider Last Rate Last Admin  . acetaminophen (TYLENOL) tablet 650 mg  650 mg Oral Q6H PRN Reubin Milan, MD       Or  . acetaminophen (TYLENOL) suppository 650 mg  650 mg Rectal Q6H PRN Reubin Milan, MD      . aspirin EC tablet 81 mg  81 mg Oral Q breakfast Reubin Milan, MD   81 mg at 12/05/19 4315  . busPIRone (BUSPAR) tablet 10 mg  10 mg Oral TID Reubin Milan,  MD   10 mg at 12/05/19 2105  . cephALEXin (KEFLEX) capsule 500 mg  500 mg Oral TID Bobette Mo, MD   500 mg at 12/05/19 2108  . doxycycline (VIBRA-TABS) tablet 100 mg  100 mg Oral BID Bobette Mo, MD   100 mg at 12/05/19 2106  . folic acid (FOLVITE) tablet 1 mg  1 mg Oral Daily Bobette Mo, MD   1 mg at 12/05/19 6063  . ipratropium-albuterol (DUONEB) 0.5-2.5  (3) MG/3ML nebulizer solution 3 mL  3 mL Nebulization Q4H PRN Bobette Mo, MD      . LORazepam (ATIVAN) injection 0.5 mg  0.5 mg Intravenous Once PRN Bobette Mo, MD      . melatonin tablet 9 mg  9 mg Oral QHS Bobette Mo, MD   9 mg at 12/05/19 2104  . mirabegron ER (MYRBETRIQ) tablet 25 mg  25 mg Oral Daily Bobette Mo, MD   25 mg at 12/05/19 0926  . multivitamin with minerals tablet 1 tablet  1 tablet Oral Daily Bobette Mo, MD   1 tablet at 12/05/19 [phone]  . mupirocin ointment (BACTROBAN) 2 % 1 application  1 application Topical BID Bobette Mo, MD   1 application at 12/05/19 2108  . niacin CR capsule 1,000 mg  1,000 mg Oral QHS Bobette Mo, MD   1,000 mg at 12/05/19 2106  . nicotine (NICODERM CQ - dosed in mg/24 hours) patch 14 mg  14 mg Transdermal Daily Alberteen Sam, MD   14 mg at 12/05/19 0925  . ondansetron (ZOFRAN) tablet 4 mg  4 mg Oral Q6H PRN Bobette Mo, MD       Or  . ondansetron [facility]) injection 4 mg  4 mg Intravenous Q6H PRN Bobette Mo, MD      . pantoprazole (PROTONIX) EC tablet 40 mg  40 mg Oral Daily Bobette Mo, MD   40 mg at 12/05/19 0925  . PARoxetine (PAXIL) tablet 20 mg  20 mg Oral Daily Bobette Mo, MD   20 mg at 12/05/19 0926  . pramipexole (MIRAPEX) tablet 1 mg  1 mg Oral QHS Bobette Mo, MD   1 mg at 12/05/19 2103  . risperiDONE (RISPERDAL) tablet 4 mg  4 mg Oral QHS Bobette Mo, MD   4 mg at 12/05/19 2104  . simvastatin (ZOCOR) tablet 20 mg  20 mg Oral QHS Bobette Mo, MD   20 mg at 12/05/19 2105  . sodium chloride flush (NS) 0.9 % injection 3 mL  3 mL Intravenous Once Bobette Mo, MD      . thiamine tablet 100 mg  100 mg Oral Daily Bobette Mo, MD   100 mg at 12/05/19 1093     Discharge Medications: Please see discharge summary for a list of discharge medications.  Relevant Imaging Results:  Relevant Lab  Results:   Additional Information SSN#: [id number]  Ewing Schlein, LCSW

## 2019-12-06 NOTE — Progress Notes (Signed)
IV removed, 2x2 gauze and paper tape applied to site, patient tolerated well. Reviewed AVS with Star Age, LPN at [facility] in [place], [place] and all questions answered.  Patient taken to front lobby via wheelchair and transported  To facility by his private caregiver. Patient's sister, Francoise Schaumann, aware of patient's discharge to [facility].

## 2019-12-06 NOTE — Evaluation (Signed)
Occupational Therapy Evaluation Patient Details Name: [name] MRN: [id number] DOB: Jan 11, 1956 Today's Date: 12/06/2019    History of Present Illness [name] is a 64 y.o. male with medical history significant of anxiety, depression, alcohol abuse, tobacco abuse, history of closed head injury, COPD, GERD, hyperlipidemia, hypertension, seizure disorder, sleep apnea who was recently discharged from the hospital due to cellulitis of the left thumb and is returning to the emergency department today after becoming very weak at home to the point that he is unable to assist with transfers from bed to chair or to go to the restroom.  He is oriented x2, answers simple questions, but is unable to contribute to significantly to HPI.  His sister states that she is the only 1 with him at home.  She states that he has become so weak that her assistance is not enough to perform transfers safely.  She also reports that he has being at times increasingly confused, has some slurred speech earlier in the day and may be a facial droop.  To her knowledge, he has not had a fever, dyspnea, chest pain, abdominal pain, nausea, vomiting, diarrhea or constipation.  No changes in appetite and sleep.   Clinical Impression   Pt agreeable to OT evaluation this am. Pt performing seated ADLs with supervision, standing tasks with min guard. Pt with short, ataxic gait during functional mobility, verbal cues for correct RW use. Pt easily fatigued during tasks, rest breaks provided as needed. Pt reporting [place] aide 5 days/week for 8 hours/day, unsure of validity of aide days/hours. Recommend SNF on discharge to improve safety and independence during ADL completion. No further acute OT services required at this time.     Follow Up Recommendations  SNF    Equipment Recommendations  None recommended by OT       Precautions / Restrictions Precautions Precautions: Fall Restrictions Weight Bearing Restrictions: No       Mobility Bed Mobility Overal bed mobility: Needs Assistance Bed Mobility: Supine to Sit     Supine to sit: Supervision        Transfers Overall transfer level: Needs assistance Equipment used: Rolling walker (2 wheeled);None Transfers: Sit to/from [employer] to Stand: Min guard Stand pivot transfers: Min guard                ADL either performed or assessed with clinical judgement   ADL Overall ADL's : Needs assistance/impaired     Grooming: Wash/dry hands;Min guard;Standing Grooming Details (indicate cue type and reason): pt standing at sink for grooming, no LOB without UE support             Lower Body Dressing: Supervision/safety;Sitting/lateral leans Lower Body Dressing Details (indicate cue type and reason): pt donning slip on shoes while seated at [employer] Transfer: Min guard;Ambulation;RW   Toileting- Clothing Manipulation and Hygiene: Supervision/safety;Sitting/lateral lean       Functional mobility during ADLs: Min guard;Rolling walker General ADL Comments: Pt performing ADLs with min guard for standing tasks, supervision for seated tasks. Increased time and small shuffling steps during functional mobility, no LOB     Vision Baseline Vision/History: No visual deficits Patient Visual Report: No change from baseline Vision Assessment?: No apparent visual deficits            Pertinent Vitals/Pain Pain Assessment: No/denies pain     Hand Dominance Right   Extremity/Trunk Assessment Upper Extremity Assessment Upper Extremity Assessment: Generalized weakness   Lower Extremity Assessment Lower Extremity  Assessment: Defer to PT evaluation   Cervical / Trunk Assessment Cervical / Trunk Assessment: Normal   Communication Communication Communication: HOH   Cognition Arousal/Alertness: Awake/alert Behavior During Therapy: WFL for tasks assessed/performed Overall Cognitive Status: History of cognitive impairments  - at baseline                                                Home Living Family/patient expects to be discharged to:: Private residence Living Arrangements: Other relatives (sister) Available Help at Discharge: Family;Personal care attendant;Available 24 hours/day Type of Home: Mobile home Home Access: Ramped entrance     Home Layout: One level     Bathroom Shower/Tub: Chief Strategy Officer: Standard     Home Equipment: Environmental consultant - 2 wheels;Cane - single point;Shower seat      Lives With: Family (sister)    Prior Functioning/Environment Level of Independence: Needs assistance  Gait / Transfers Assistance Needed: household ambulator without AD ADL's / Homemaking Assistance Needed: Pt reports HH aide 5 days/week for 8 hours/day who assists with ADLs. Sister/family assists when aide is not available            OT Problem List: Decreased strength;Decreased activity tolerance;Impaired balance (sitting and/or standing);Decreased safety awareness;Decreased knowledge of use of DME or AE       AM-PAC OT "6 Clicks" Daily Activity     Outcome Measure Help from another person eating meals?: None Help from another person taking care of personal grooming?: A Little Help from another person toileting, which includes using toliet, bedpan, or urinal?: A Little Help from another person bathing (including washing, rinsing, drying)?: A Lot Help from another person to put on and taking off regular upper body clothing?: A Little Help from another person to put on and taking off regular lower body clothing?: A Little 6 Click Score: 18   End of Session Equipment Utilized During Treatment: Gait belt;Rolling walker  Activity Tolerance: Patient tolerated treatment well Patient left: in chair;with call bell/phone within reach;with chair alarm set  OT Visit Diagnosis: Muscle weakness (generalized) (M62.81)                Time: 1610-9604 OT Time Calculation  (min): 16 min Charges:  OT General Charges $OT Visit: 1 Visit OT Evaluation $OT Eval Low Complexity: 1 Low   Ezra Sites, OTR/L  [phone] 12/06/2019, 7:55 AM

## 2020-01-04 ENCOUNTER — Telehealth: Payer: Self-pay | Admitting: Family Medicine

## 2020-01-04 DIAGNOSIS — Z9181 History of falling: Secondary | ICD-10-CM

## 2020-01-04 DIAGNOSIS — R2689 Other abnormalities of gait and mobility: Secondary | ICD-10-CM

## 2020-01-04 NOTE — Telephone Encounter (Signed)
FYI: Lori called from [facility] to report that patient has a small place on his bottom that is a little red but is not an open would. Says pt is getting up every 2 hrs and family is keeping Desitine on it.

## 2020-01-04 NOTE — Telephone Encounter (Signed)
Thank for the information and yes keep doing the Desitin and keep having him get up every couple hours to make sure he is moving and not stuck in one spot.

## 2020-01-04 NOTE — Telephone Encounter (Signed)
Pts sister state that [facility] suggested the pt get an order for a walker with a seat. She is wanting to see if Dr. Louanne Skye can write this rx.

## 2020-01-04 NOTE — Telephone Encounter (Signed)
Lmtcb.

## 2020-01-08 NOTE — Telephone Encounter (Signed)
Sister calling to see if rx for wheelchair has been faxed [employer]. Please call back

## 2020-01-15 ENCOUNTER — Ambulatory Visit (INDEPENDENT_AMBULATORY_CARE_PROVIDER_SITE_OTHER): Payer: Medicare Other

## 2020-01-15 ENCOUNTER — Other Ambulatory Visit: Payer: Self-pay

## 2020-01-15 DIAGNOSIS — R270 Ataxia, unspecified: Secondary | ICD-10-CM

## 2020-01-15 DIAGNOSIS — J449 Chronic obstructive pulmonary disease, unspecified: Secondary | ICD-10-CM | POA: Diagnosis not present

## 2020-01-15 DIAGNOSIS — I25119 Atherosclerotic heart disease of native coronary artery with unspecified angina pectoris: Secondary | ICD-10-CM

## 2020-01-15 DIAGNOSIS — M5030 Other cervical disc degeneration, unspecified cervical region: Secondary | ICD-10-CM

## 2020-01-15 DIAGNOSIS — I10 Essential (primary) hypertension: Secondary | ICD-10-CM

## 2020-01-15 DIAGNOSIS — G9340 Encephalopathy, unspecified: Secondary | ICD-10-CM

## 2020-01-15 DIAGNOSIS — K219 Gastro-esophageal reflux disease without esophagitis: Secondary | ICD-10-CM

## 2020-01-15 DIAGNOSIS — F411 Generalized anxiety disorder: Secondary | ICD-10-CM

## 2020-01-15 DIAGNOSIS — F0280 Dementia in other diseases classified elsewhere without behavioral disturbance: Secondary | ICD-10-CM | POA: Diagnosis not present

## 2020-01-15 DIAGNOSIS — G40909 Epilepsy, unspecified, not intractable, without status epilepticus: Secondary | ICD-10-CM

## 2020-01-15 DIAGNOSIS — D509 Iron deficiency anemia, unspecified: Secondary | ICD-10-CM

## 2020-01-15 DIAGNOSIS — G2581 Restless legs syndrome: Secondary | ICD-10-CM

## 2020-01-15 DIAGNOSIS — E785 Hyperlipidemia, unspecified: Secondary | ICD-10-CM

## 2020-01-15 DIAGNOSIS — F17210 Nicotine dependence, cigarettes, uncomplicated: Secondary | ICD-10-CM

## 2020-01-15 DIAGNOSIS — F329 Major depressive disorder, single episode, unspecified: Secondary | ICD-10-CM

## 2020-01-15 DIAGNOSIS — R519 Headache, unspecified: Secondary | ICD-10-CM

## 2020-01-15 DIAGNOSIS — I671 Cerebral aneurysm, nonruptured: Secondary | ICD-10-CM

## 2020-01-15 DIAGNOSIS — S61102D Unspecified open wound of left thumb with damage to nail, subsequent encounter: Secondary | ICD-10-CM

## 2020-01-15 DIAGNOSIS — F1010 Alcohol abuse, uncomplicated: Secondary | ICD-10-CM

## 2020-01-15 DIAGNOSIS — G4730 Sleep apnea, unspecified: Secondary | ICD-10-CM

## 2020-01-15 DIAGNOSIS — M1990 Unspecified osteoarthritis, unspecified site: Secondary | ICD-10-CM

## 2020-01-15 NOTE — Telephone Encounter (Signed)
Walker with adjustable seat.

## 2020-01-15 NOTE — Telephone Encounter (Signed)
I guess I am misunderstanding, initially they were asking for a walker with a seat?  Are they asking for a wheelchair or a walker now?

## 2020-01-15 NOTE — Telephone Encounter (Signed)
Lmtcb.

## 2020-01-16 NOTE — Telephone Encounter (Signed)
Printed order for this, will sign tomorrow

## 2020-01-17 NOTE — Telephone Encounter (Signed)
Lmtcb.

## 2020-01-18 ENCOUNTER — Encounter: Payer: Self-pay | Admitting: Family Medicine

## 2020-01-18 ENCOUNTER — Other Ambulatory Visit: Payer: Self-pay

## 2020-01-18 ENCOUNTER — Ambulatory Visit (INDEPENDENT_AMBULATORY_CARE_PROVIDER_SITE_OTHER): Payer: Medicare Other | Admitting: Family Medicine

## 2020-01-18 VITALS — BP 85/48 | HR 81 | Temp 98.2°F | Ht 67.0 in | Wt 135.0 lb

## 2020-01-18 DIAGNOSIS — J439 Emphysema, unspecified: Secondary | ICD-10-CM | POA: Diagnosis not present

## 2020-01-18 DIAGNOSIS — F411 Generalized anxiety disorder: Secondary | ICD-10-CM | POA: Diagnosis not present

## 2020-01-18 DIAGNOSIS — F339 Major depressive disorder, recurrent, unspecified: Secondary | ICD-10-CM

## 2020-01-18 DIAGNOSIS — R531 Weakness: Secondary | ICD-10-CM

## 2020-01-18 DIAGNOSIS — Z8673 Personal history of transient ischemic attack (TIA), and cerebral infarction without residual deficits: Secondary | ICD-10-CM | POA: Diagnosis not present

## 2020-01-18 DIAGNOSIS — R269 Unspecified abnormalities of gait and mobility: Secondary | ICD-10-CM

## 2020-01-18 MED ORDER — RISPERIDONE 2 MG PO TABS: 2.0000 mg | ORAL_TABLET | Freq: Every day | ORAL | 3 refills | Status: DC

## 2020-01-18 NOTE — Progress Notes (Signed)
BP (!) 85/48   Pulse 81   Temp 98.2 F (36.8 C)   Ht '5\' 7"'$  (1.702 m)   Wt 135 lb (61.2 kg)   SpO2 93%   BMI 21.14 kg/m    Subjective:   Patient ID: [name], male    DOB: 08/22/1955, 64 y.o.   MRN: [id number]  HPI: [name] is a 64 y.o. male presenting on 01/18/2020 for [facility] Follow up   HPI Patient was in the hospital for difficulty walking and talking on the 6/18 and d/cd for TIA and possible stroke. He has been doing home PT and home health. He is still smoking and oxygen is down when he smokes.  He is still not walking on his own after [facility] but has home PT and home health. .  He gets short of breath really quickly.  He is a lot slower and his right leg drags some. He was dragging both but now just the right.   Relevant past medical, surgical, family and social history reviewed and updated as indicated. Interim medical history since our last visit reviewed. Allergies and medications reviewed and updated.  Review of Systems  Constitutional: Positive for fatigue. Negative for chills and fever.  Eyes: Negative for visual disturbance.  Respiratory: Negative for shortness of breath and wheezing.   Cardiovascular: Negative for chest pain and leg swelling.  Musculoskeletal: Positive for gait problem. Negative for arthralgias and back pain.  Skin: Negative for rash.  Neurological: Positive for weakness and numbness. Negative for dizziness.  Psychiatric/Behavioral: Positive for dysphoric mood and sleep disturbance. The patient is nervous/anxious.   All other systems reviewed and are negative.   Per HPI unless specifically indicated above   Allergies as of 01/18/2020      Reactions   Clonidine Rash      Medication List       Accurate as of January 18, 2020  4:38 PM. If you have any questions, ask your nurse or doctor.        STOP taking these medications   cephALEXin 500 MG capsule Commonly known as: Keflex Stopped by: Fransisca Kaufmann  Wenzlick,  MD   doxycycline 100 MG tablet Commonly known as: VIBRA-TABS Stopped by: Worthy Rancher, MD     TAKE these medications   acetaminophen 325 MG tablet Commonly known as: TYLENOL Take 2 tablets (650 mg total) by mouth every 6 (six) hours as needed for mild pain (or Fever >/= 101).   albuterol 108 (90 Base) MCG/ACT inhaler Commonly known as: VENTOLIN HFA 2 PUFFS EVERY 6 HOURS AS NEEDED FOR WHEEZING OR SHORTNESS OF BREATH What changed: See the new instructions.   aspirin EC 81 MG tablet Take 1 tablet (81 mg total) by mouth daily with breakfast.   busPIRone 10 MG tablet Commonly known as: BUSPAR Take 10 mg by mouth 3 (three) times daily.   esomeprazole 40 MG capsule Commonly known as: NEXIUM TAKE (1) CAPSULE DAILY What changed: See the new instructions.   folic acid 1 MG tablet Commonly known as: FOLVITE Take 1 tablet (1 mg total) by mouth daily.   ipratropium-albuterol 0.5-2.5 (3) MG/3ML Soln Commonly known as: DUONEB Take 3 mLs by nebulization every 4 (four) hours as needed. What changed: reasons to take this   Melatonin 10 MG Tabs Take 10 mg by mouth at bedtime.   metoprolol succinate 25 MG 24 hr tablet Commonly known as: TOPROL-XL Take 1 tablet (25 mg total) by mouth daily.  mirabegron ER 25 MG Tb24 tablet Commonly known as: Myrbetriq Take 1 tablet (25 mg total) by mouth daily.   multivitamin with minerals Tabs tablet Take 1 tablet by mouth daily.   mupirocin ointment 2 % Commonly known as: BACTROBAN PLACE 1 APPLICATION INTO THE NOSE 2 TIMES A DAY What changed:   how much to take  how to take this  when to take this  additional instructions   niacin 1000 MG CR tablet Commonly known as: NIASPAN TAKE 1 TABLET AT BEDTIME   PARoxetine 20 MG tablet Commonly known as: Paxil Take 1 tablet (20 mg total) by mouth daily.   potassium chloride 10 MEQ tablet Commonly known as: KLOR-CON Take 1 tablet (10 mEq total) by mouth daily.   pramipexole 0.5 MG  tablet Commonly known as: MIRAPEX Take 2 tablets (1 mg total) by mouth at bedtime.   risperiDONE 2 MG tablet Commonly known as: RISPERDAL Take 1 tablet (2 mg total) by mouth at bedtime. What changed:   medication strength  how much to take Changed by: Fransisca Kaufmann Jeni Duling, MD   simvastatin 20 MG tablet Commonly known as: ZOCOR Take 1 tablet (20 mg total) by mouth at bedtime.   thiamine 100 MG tablet Take 1 tablet (100 mg total) by mouth daily.        Objective:   BP (!) 85/48   Pulse 81   Temp 98.2 F (36.8 C)   Ht '5\' 7"'$  (1.702 m)   Wt 135 lb (61.2 kg)   SpO2 93%   BMI 21.14 kg/m   Wt Readings from Last 3 Encounters:  01/18/20 135 lb (61.2 kg)  12/03/19 130 lb 1.1 oz (59 kg)  11/29/19 131 lb 11.2 oz (59.7 kg)    Physical Exam Vitals and nursing note reviewed.  Constitutional:      General: He is not in acute distress.    Appearance: He is well-developed. He is not diaphoretic.  Eyes:     General: No scleral icterus.    Conjunctiva/sclera: Conjunctivae normal.  Neck:     Thyroid: No thyromegaly.  Cardiovascular:     Rate and Rhythm: Normal rate and regular rhythm.     Heart sounds: Normal heart sounds. No murmur heard.   Pulmonary:     Effort: Pulmonary effort is normal. No respiratory distress.     Breath sounds: Normal breath sounds. No wheezing.  Musculoskeletal:     Cervical back: Neck supple.  Lymphadenopathy:     Cervical: No cervical adenopathy.  Skin:    General: Skin is warm and dry.     Findings: No rash.  Neurological:     Mental Status: He is alert and oriented to person, place, and time.     Coordination: Coordination normal.  Psychiatric:        Behavior: Behavior normal.       Assessment & Plan:   Problem List Items Addressed This Visit      Respiratory   COPD (chronic obstructive pulmonary disease) ([place]) - Primary   Relevant Orders   Ambulatory referral to Pulmonology     Other   Generalized anxiety disorder   Relevant  Medications   risperidone (RISPERDAL) 2 MG tablet   Depression, recurrent (HCC)   Relevant Medications   risperidone (RISPERDAL) 2 MG tablet   Weakness   Relevant Orders   CBC with Differential/Platelet   CMP14+EGFR    Other Visit Diagnoses    History of transient ischemic attack (TIA)  Relevant Orders   CBC with Differential/Platelet   CMP14+EGFR   Gait abnormality       Relevant Orders   CBC with Differential/Platelet   CMP14+EGFR      Reduced Risperdal, currently has HH, continue with this. Follow up plan: Return in about 2 months (around 03/19/2020), or if symptoms worsen or fail to improve, for weakness recheck, gait recheck.  Counseling provided for all of the vaccine components Orders Placed This Encounter  Procedures  . CBC with Differential/Platelet  . CMP14+EGFR  . Ambulatory referral to [place], MD [facility] 01/18/2020, 4:38 PM

## 2020-01-19 LAB — CBC WITH DIFFERENTIAL/PLATELET
Basophils Absolute: 0.1 10*3/uL (ref 0.0–0.2)
Basos: 1 %
EOS (ABSOLUTE): 0.1 10*3/uL (ref 0.0–0.4)
Eos: 1 %
Hematocrit: 37.3 % — ABNORMAL LOW (ref 37.5–51.0)
Hemoglobin: 12.1 g/dL — ABNORMAL LOW (ref 13.0–17.7)
Immature Grans (Abs): 0 10*3/uL (ref 0.0–0.1)
Immature Granulocytes: 0 %
Lymphocytes Absolute: 2.2 10*3/uL (ref 0.7–3.1)
Lymphs: 26 %
MCH: 32.1 pg (ref 26.6–33.0)
MCHC: 32.4 g/dL (ref 31.5–35.7)
MCV: 99 fL — ABNORMAL HIGH (ref 79–97)
Monocytes Absolute: 0.7 10*3/uL (ref 0.1–0.9)
Monocytes: 8 %
Neutrophils Absolute: 5.3 10*3/uL (ref 1.4–7.0)
Neutrophils: 64 %
Platelets: 243 10*3/uL (ref 150–450)
RBC: 3.77 x10E6/uL — ABNORMAL LOW (ref 4.14–5.80)
RDW: 12.2 % (ref 11.6–15.4)
WBC: 8.3 10*3/uL (ref 3.4–10.8)

## 2020-01-19 LAB — CMP14+EGFR
ALT: 10 IU/L (ref 0–44)
AST: 17 IU/L (ref 0–40)
Albumin/Globulin Ratio: 2.3 — ABNORMAL HIGH (ref 1.2–2.2)
Albumin: 4.4 g/dL (ref 3.8–4.8)
Alkaline Phosphatase: 92 IU/L (ref 48–121)
BUN/Creatinine Ratio: 18 (ref 10–24)
BUN: 14 mg/dL (ref 8–27)
Bilirubin Total: 0.4 mg/dL (ref 0.0–1.2)
CO2: 24 mmol/L (ref 20–29)
Calcium: 9.6 mg/dL (ref 8.6–10.2)
Chloride: 100 mmol/L (ref 96–106)
Creatinine, Ser: 0.77 mg/dL (ref 0.76–1.27)
GFR calc Af Amer: 110 mL/min/{1.73_m2} (ref >59)
GFR calc non Af Amer: 95 mL/min/{1.73_m2} (ref >59)
Globulin, Total: 1.9 g/dL (ref 1.5–4.5)
Glucose: 100 mg/dL — ABNORMAL HIGH (ref 65–99)
Potassium: 4.1 mmol/L (ref 3.5–5.2)
Sodium: 138 mmol/L (ref 134–144)
Total Protein: 6.3 g/dL (ref 6.0–8.5)

## 2020-02-02 ENCOUNTER — Encounter (HOSPITAL_COMMUNITY): Payer: Self-pay

## 2020-02-02 ENCOUNTER — Inpatient Hospital Stay (HOSPITAL_COMMUNITY)
Admission: EM | Admit: 2020-02-02 | Discharge: 2020-02-05 | DRG: 093 | Disposition: A | Discharge status: Home Health | Payer: Medicare Other | Attending: Internal Medicine | Admitting: Internal Medicine

## 2020-02-02 ENCOUNTER — Emergency Department (HOSPITAL_COMMUNITY): Payer: Medicare Other

## 2020-02-02 DIAGNOSIS — E785 Hyperlipidemia, unspecified: Secondary | ICD-10-CM | POA: Diagnosis present

## 2020-02-02 DIAGNOSIS — K219 Gastro-esophageal reflux disease without esophagitis: Secondary | ICD-10-CM | POA: Diagnosis present

## 2020-02-02 DIAGNOSIS — G4730 Sleep apnea, unspecified: Secondary | ICD-10-CM | POA: Diagnosis present

## 2020-02-02 DIAGNOSIS — E860 Dehydration: Secondary | ICD-10-CM | POA: Diagnosis present

## 2020-02-02 DIAGNOSIS — R471 Dysarthria and anarthria: Principal | ICD-10-CM | POA: Diagnosis present

## 2020-02-02 DIAGNOSIS — M19012 Primary osteoarthritis, left shoulder: Secondary | ICD-10-CM | POA: Diagnosis present

## 2020-02-02 DIAGNOSIS — F329 Major depressive disorder, single episode, unspecified: Secondary | ICD-10-CM | POA: Diagnosis present

## 2020-02-02 DIAGNOSIS — I10 Essential (primary) hypertension: Secondary | ICD-10-CM | POA: Diagnosis present

## 2020-02-02 DIAGNOSIS — X58XXXS Exposure to other specified factors, sequela: Secondary | ICD-10-CM | POA: Diagnosis present

## 2020-02-02 DIAGNOSIS — G459 Transient cerebral ischemic attack, unspecified: Secondary | ICD-10-CM | POA: Diagnosis present

## 2020-02-02 DIAGNOSIS — Z20822 Contact with and (suspected) exposure to covid-19: Secondary | ICD-10-CM | POA: Diagnosis present

## 2020-02-02 DIAGNOSIS — I2510 Atherosclerotic heart disease of native coronary artery without angina pectoris: Secondary | ICD-10-CM | POA: Diagnosis present

## 2020-02-02 DIAGNOSIS — G40909 Epilepsy, unspecified, not intractable, without status epilepticus: Secondary | ICD-10-CM | POA: Diagnosis present

## 2020-02-02 DIAGNOSIS — Z79899 Other long term (current) drug therapy: Secondary | ICD-10-CM

## 2020-02-02 DIAGNOSIS — G8324 Monoplegia of upper limb affecting left nondominant side: Secondary | ICD-10-CM | POA: Diagnosis present

## 2020-02-02 DIAGNOSIS — Z7982 Long term (current) use of aspirin: Secondary | ICD-10-CM

## 2020-02-02 DIAGNOSIS — I739 Peripheral vascular disease, unspecified: Secondary | ICD-10-CM | POA: Diagnosis present

## 2020-02-02 DIAGNOSIS — Z79890 Hormone replacement therapy (postmenopausal): Secondary | ICD-10-CM

## 2020-02-02 DIAGNOSIS — F17200 Nicotine dependence, unspecified, uncomplicated: Secondary | ICD-10-CM | POA: Diagnosis present

## 2020-02-02 DIAGNOSIS — M436 Torticollis: Secondary | ICD-10-CM | POA: Diagnosis present

## 2020-02-02 DIAGNOSIS — D649 Anemia, unspecified: Secondary | ICD-10-CM | POA: Diagnosis present

## 2020-02-02 DIAGNOSIS — R4701 Aphasia: Secondary | ICD-10-CM | POA: Diagnosis present

## 2020-02-02 DIAGNOSIS — J449 Chronic obstructive pulmonary disease, unspecified: Secondary | ICD-10-CM | POA: Diagnosis present

## 2020-02-02 DIAGNOSIS — N3281 Overactive bladder: Secondary | ICD-10-CM | POA: Diagnosis present

## 2020-02-02 DIAGNOSIS — S069X0S Unspecified intracranial injury without loss of consciousness, sequela: Secondary | ICD-10-CM

## 2020-02-02 DIAGNOSIS — E875 Hyperkalemia: Secondary | ICD-10-CM | POA: Diagnosis present

## 2020-02-02 DIAGNOSIS — F419 Anxiety disorder, unspecified: Secondary | ICD-10-CM | POA: Diagnosis present

## 2020-02-02 DIAGNOSIS — R270 Ataxia, unspecified: Secondary | ICD-10-CM | POA: Diagnosis present

## 2020-02-02 DIAGNOSIS — G8929 Other chronic pain: Secondary | ICD-10-CM | POA: Diagnosis present

## 2020-02-02 LAB — BASIC METABOLIC PANEL
Anion gap: 13 (ref 5–15)
BUN: 16 mg/dL (ref 8–23)
CO2: 19 mmol/L — ABNORMAL LOW (ref 22–32)
Calcium: 8.9 mg/dL (ref 8.9–10.3)
Chloride: 106 mmol/L (ref 98–111)
Creatinine, Ser: 0.6 mg/dL — ABNORMAL LOW (ref 0.61–1.24)
GFR calc Af Amer: >60 mL/min (ref >60)
GFR calc non Af Amer: >60 mL/min (ref >60)
Glucose, Bld: 99 mg/dL (ref 70–99)
Potassium: 5.5 mmol/L — ABNORMAL HIGH (ref 3.5–5.1)
Sodium: 138 mmol/L (ref 135–145)

## 2020-02-02 LAB — CBC WITH DIFFERENTIAL/PLATELET
Abs Immature Granulocytes: 0.03 10*3/uL (ref 0.00–0.07)
Basophils Absolute: 0.1 10*3/uL (ref 0.0–0.1)
Basophils Relative: 1 %
Eosinophils Absolute: 0.1 10*3/uL (ref 0.0–0.5)
Eosinophils Relative: 1 %
HCT: 40.4 % (ref 39.0–52.0)
Hemoglobin: 12.9 g/dL — ABNORMAL LOW (ref 13.0–17.0)
Immature Granulocytes: 0 %
Lymphocytes Relative: 25 %
Lymphs Abs: 2.3 10*3/uL (ref 0.7–4.0)
MCH: 32.8 pg (ref 26.0–34.0)
MCHC: 31.9 g/dL (ref 30.0–36.0)
MCV: 102.8 fL — ABNORMAL HIGH (ref 80.0–100.0)
Monocytes Absolute: 0.8 10*3/uL (ref 0.1–1.0)
Monocytes Relative: 8 %
Neutro Abs: 6 10*3/uL (ref 1.7–7.7)
Neutrophils Relative %: 65 %
Platelets: 189 10*3/uL (ref 150–400)
RBC: 3.93 MIL/uL — ABNORMAL LOW (ref 4.22–5.81)
RDW: 13.2 % (ref 11.5–15.5)
WBC: 9.3 10*3/uL (ref 4.0–10.5)
nRBC: 0 % (ref 0.0–0.2)

## 2020-02-02 LAB — COMPREHENSIVE METABOLIC PANEL
ALT: 12 U/L (ref 0–44)
AST: 29 U/L (ref 15–41)
Albumin: 3.8 g/dL (ref 3.5–5.0)
Alkaline Phosphatase: 69 U/L (ref 38–126)
Anion gap: 9 (ref 5–15)
BUN: 16 mg/dL (ref 8–23)
CO2: 22 mmol/L (ref 22–32)
Calcium: 8.8 mg/dL — ABNORMAL LOW (ref 8.9–10.3)
Chloride: 106 mmol/L (ref 98–111)
Creatinine, Ser: 0.62 mg/dL (ref 0.61–1.24)
GFR calc Af Amer: >60 mL/min (ref >60)
GFR calc non Af Amer: >60 mL/min (ref >60)
Glucose, Bld: 99 mg/dL (ref 70–99)
Potassium: 5.4 mmol/L — ABNORMAL HIGH (ref 3.5–5.1)
Sodium: 137 mmol/L (ref 135–145)
Total Bilirubin: 1.5 mg/dL — ABNORMAL HIGH (ref 0.3–1.2)
Total Protein: 6.9 g/dL (ref 6.5–8.1)

## 2020-02-02 LAB — SARS CORONAVIRUS 2 BY RT PCR (HOSPITAL ORDER, PERFORMED IN ~~LOC~~ HOSPITAL LAB): SARS Coronavirus 2: NEGATIVE

## 2020-02-02 LAB — POTASSIUM: Potassium: 3.5 mmol/L (ref 3.5–5.1)

## 2020-02-02 MED ORDER — PAROXETINE HCL 20 MG PO TABS
20.0000 mg | ORAL_TABLET | Freq: Every day | ORAL | Status: DC
  Administered 2020-02-03 – 2020-02-05 (×3): 20 mg via ORAL
  Filled 2020-02-02 (×6): qty 1

## 2020-02-02 MED ORDER — MELATONIN 3 MG PO TABS
9.0000 mg | ORAL_TABLET | Freq: Every day | ORAL | Status: DC
  Administered 2020-02-03 – 2020-02-04 (×2): 9 mg via ORAL
  Filled 2020-02-02 (×2): qty 3

## 2020-02-02 MED ORDER — SIMVASTATIN 20 MG PO TABS
20.0000 mg | ORAL_TABLET | Freq: Every day | ORAL | Status: DC
  Administered 2020-02-03 – 2020-02-04 (×2): 20 mg via ORAL
  Filled 2020-02-02 (×2): qty 1

## 2020-02-02 MED ORDER — ACETAMINOPHEN 325 MG PO TABS: 650.0000 mg | ORAL_TABLET | ORAL | Status: DC | PRN

## 2020-02-02 MED ORDER — MIRABEGRON ER 25 MG PO TB24
25.0000 mg | ORAL_TABLET | Freq: Every day | ORAL | Status: DC
  Administered 2020-02-03 – 2020-02-05 (×3): 25 mg via ORAL
  Filled 2020-02-02 (×6): qty 1

## 2020-02-02 MED ORDER — METOPROLOL SUCCINATE ER 25 MG PO TB24
25.0000 mg | ORAL_TABLET | Freq: Every day | ORAL | Status: DC
  Administered 2020-02-03 – 2020-02-05 (×3): 25 mg via ORAL
  Filled 2020-02-02 (×3): qty 1

## 2020-02-02 MED ORDER — BUSPIRONE HCL 10 MG PO TABS
10.0000 mg | ORAL_TABLET | Freq: Three times a day (TID) | ORAL | Status: DC
  Administered 2020-02-03 – 2020-02-05 (×7): 10 mg via ORAL
  Filled 2020-02-02 (×7): qty 1

## 2020-02-02 MED ORDER — SODIUM CHLORIDE 0.9 % IV BOLUS
500.0000 mL | Freq: Once | INTRAVENOUS | Status: AC
  Administered 2020-02-02: 500 mL via INTRAVENOUS

## 2020-02-02 MED ORDER — NIACIN ER (ANTIHYPERLIPIDEMIC) 500 MG PO TBCR
1000.0000 mg | EXTENDED_RELEASE_TABLET | Freq: Every day | ORAL | Status: DC
  Administered 2020-02-03 – 2020-02-04 (×2): 1000 mg via ORAL
  Filled 2020-02-02 (×3): qty 2

## 2020-02-02 MED ORDER — ACETAMINOPHEN 650 MG RE SUPP: 650.0000 mg | RECTAL | Status: DC | PRN

## 2020-02-02 MED ORDER — THIAMINE HCL 100 MG PO TABS
100.0000 mg | ORAL_TABLET | Freq: Every day | ORAL | Status: DC
  Administered 2020-02-03 – 2020-02-05 (×3): 100 mg via ORAL
  Filled 2020-02-02 (×3): qty 1

## 2020-02-02 MED ORDER — HEPARIN SODIUM (PORCINE) 5000 UNIT/ML IJ SOLN
5000.0000 [IU] | Freq: Three times a day (TID) | INTRAMUSCULAR | Status: DC
  Administered 2020-02-03 – 2020-02-05 (×7): 5000 [IU] via SUBCUTANEOUS
  Filled 2020-02-02 (×8): qty 1

## 2020-02-02 MED ORDER — PRAMIPEXOLE DIHYDROCHLORIDE 1 MG PO TABS
1.0000 mg | ORAL_TABLET | Freq: Every day | ORAL | Status: DC
  Administered 2020-02-03 – 2020-02-04 (×2): 1 mg via ORAL
  Filled 2020-02-02 (×3): qty 1

## 2020-02-02 MED ORDER — STROKE: EARLY STAGES OF RECOVERY BOOK
Freq: Once | Status: AC
  Filled 2020-02-02 (×2): qty 1

## 2020-02-02 MED ORDER — RISPERIDONE 0.5 MG PO TABS
2.0000 mg | ORAL_TABLET | Freq: Every day | ORAL | Status: DC
  Administered 2020-02-03 – 2020-02-04 (×2): 2 mg via ORAL
  Filled 2020-02-02 (×2): qty 4

## 2020-02-02 MED ORDER — ONDANSETRON HCL 4 MG/2ML IJ SOLN: 4.0000 mg | Freq: Four times a day (QID) | INTRAMUSCULAR | Status: DC | PRN

## 2020-02-02 MED ORDER — IPRATROPIUM-ALBUTEROL 0.5-2.5 (3) MG/3ML IN SOLN: 3.0000 mL | RESPIRATORY_TRACT | Status: DC | PRN

## 2020-02-02 MED ORDER — ALBUTEROL SULFATE HFA 108 (90 BASE) MCG/ACT IN AERS
2.0000 | INHALATION_SPRAY | Freq: Four times a day (QID) | RESPIRATORY_TRACT | Status: DC | PRN
  Filled 2020-02-02: qty 6.7

## 2020-02-02 MED ORDER — FOLIC ACID 1 MG PO TABS
1.0000 mg | ORAL_TABLET | Freq: Every day | ORAL | Status: DC
  Administered 2020-02-03 – 2020-02-05 (×3): 1 mg via ORAL
  Filled 2020-02-02 (×3): qty 1

## 2020-02-02 MED ORDER — ACETAMINOPHEN 160 MG/5ML PO SOLN: 650.0000 mg | ORAL | Status: DC | PRN

## 2020-02-02 MED ORDER — PANTOPRAZOLE SODIUM 40 MG PO TBEC
40.0000 mg | DELAYED_RELEASE_TABLET | Freq: Every day | ORAL | Status: DC
  Administered 2020-02-03 – 2020-02-05 (×3): 40 mg via ORAL
  Filled 2020-02-02 (×3): qty 1

## 2020-02-02 MED ORDER — ASPIRIN EC 81 MG PO TBEC
81.0000 mg | DELAYED_RELEASE_TABLET | Freq: Every day | ORAL | Status: DC
  Administered 2020-02-03 – 2020-02-05 (×3): 81 mg via ORAL
  Filled 2020-02-02 (×3): qty 1

## 2020-02-02 MED ORDER — POLYETHYLENE GLYCOL 3350 17 G PO PACK: 17.0000 g | PACK | Freq: Every day | ORAL | Status: DC | PRN

## 2020-02-02 NOTE — Consult Note (Signed)
[facility] TeleNeurology Consult Services  Stat Consult  Date of Service:   02/02/2020 18:19:29  Impression:     .  R52.81 - Slurred speech  Comments/Sign-Out: 64 y/o man with episode of dysarthria reported by family - he is still fairly dysarthric now but per family this is his baseline. Since the history is quite vague and he does have multiple risk factors for stroke, I think this needs to be treated as a TIA - would admit for telemetry and brain MRI, and need for further stroke work-up or medication adjustments to be determined based on MRI result.  CT HEAD: Showed No Acute Hemorrhage or Acute Core Infarct Reviewed  Metrics: [facility] Notification Time: 02/02/2020 18:18:56 Stamp Time: 02/02/2020 18:19:29 Callback Response Time: 02/02/2020 18:22:24  Our recommendations are outlined below.  Imaging Studies:     .  MRI Head Without Contrast  Disposition: Neurology Follow Up Recommended  Sign Out:     .  Discussed with Emergency Department Provider  ----------------------------------------------------------------------------------------------------  Chief Complaint: dysarthria  History of Present Illness: Patient is a 64 year old Male.  Family called EMS because he was slurring his speech, reported the slurred speech lasted about an hour and his speech was normal by the time EMS arrived. He had also complained of some shortness of breath earlier in the day. Patient's only complaint now is left shoulder pain and the night before. He recalls feeling "tired" when the ambulance came, does not know why his family called EMS, does not feel he is having any difficulty with his speech. He has a history of seizure disorder for which he is not currently on meds, he reports his last seizure was five years ago.    Past Medical History:     . Hypertension     . Hyperlipidemia     . Coronary Artery Disease     . There is NO history of Diabetes Mellitus      . There is NO history of Atrial Fibrillation     . There is NO history of Stroke  Anticoagulant use:  No  Antiplatelet use: aspirin 81 mg     Examination: BP(133/59), Pulse(51), Blood Glucose(99)  Neuro Exam:  General: Alert,Awake, Oriented to Time, Place, Person  Speech: Dysarthric:  Language: Intact:  Face: Symmetric:  Facial Sensation: Intact:  Visual Fields: Intact:  Extraocular Movements: Intact:  Motor Exam: No Drift:  Sensation: Intact:  Coordination: Intact:      Patient/Family was informed the Neurology Consult would occur via TeleHealth consult by way of interactive audio and video telecommunications and consented to receiving care in this manner.  Patient is being evaluated for possible acute neurologic impairment and high probability of imminent or life-threatening deterioration. I spent total of 25 minutes providing care to this patient, including time for face to face visit via telemedicine, review of medical records, imaging studies and discussion of findings with providers, the patient and/or family.   Dr Sherilyn Cooter   [facility] [phone]  Case [id number]

## 2020-02-02 NOTE — ED Provider Notes (Signed)
[facility] EMERGENCY DEPARTMENT Provider Note   CSN: [id number] Arrival date & time: 02/02/20  1159     History Chief Complaint  Patient presents with  . Aphasia    [name] is a 64 y.o. male.  Patient had slurred speech for an hour.  Patient is back to his normal according to his family and the patient.6            The history is provided by the patient and medical records. No language interpreter was used.  Weakness Severity:  Unable to specify Onset quality:  Sudden Timing:  Sporadic Progression:  Improving Chronicity:  New Context: not alcohol use   Relieved by:  Nothing Worsened by:  Nothing Ineffective treatments:  None tried Associated symptoms: no abdominal pain, no chest pain, no cough, no diarrhea, no frequency, no headaches and no seizures        Past Medical History:  Diagnosis Date  . Anxiety   . Cerebral aneurysm   . Closed head injury   . COPD (chronic obstructive pulmonary disease) (HCC)   . Depression   . GERD (gastroesophageal reflux disease)   . Hyperlipidemia   . Hypertension   . Seizures (HCC)   . Sleep apnea     Patient Active Problem List   Diagnosis Date Noted  . Weakness 12/04/2019  . Ataxia 12/03/2019  . Hypokalemia 12/03/2019  . Macrocytic anemia 12/03/2019  . Cellulitis of left thumb 11/29/2019  . Mild tetrahydrocannabinol (THC) abuse 08/09/2019  . Controlled substance agreement signed 03/14/2018  . DDD (degenerative disc disease), cervical 08/24/2017  . Arthritis 07/29/2017  . Memory loss or impairment 07/08/2016  . Loss of weight 07/07/2013  . Frequent urination 11/04/2012  . Seizure disorder (HCC) 11/04/2012  . RLS (restless legs syndrome) 11/04/2012  . HLD (hyperlipidemia) 11/04/2012  . CAD (coronary artery disease) 11/04/2012  . HEAD TRAUMA, CLOSED 04/16/2009  . Hyperlipidemia 06/05/2008  . Generalized anxiety disorder 06/05/2008  . History of alcohol abuse 06/05/2008  . TOBACCO ABUSE 06/05/2008  .  Depression, recurrent (HCC) 06/05/2008  . OBSTRUCTIVE SLEEP APNEA 06/05/2008  . Essential hypertension 06/05/2008  . CEREBRAL ANEURYSM 06/05/2008  . COPD (chronic obstructive pulmonary disease) (HCC) 06/05/2008  . GERD 06/05/2008  . HEADACHE, CHRONIC 06/05/2008    History reviewed. No pertinent surgical history.     Family History  Problem Relation Age of Onset  . Obesity Sister     Social History   Tobacco Use  . Smoking status: Current Every Day Smoker    Packs/day: 1.00    Years: 50.00    Pack years: 50.00  . Smokeless tobacco: Never Used  Substance Use Topics  . Alcohol use: No  . Drug use: Yes    Types: Marijuana    Home Medications Prior to Admission medications   Medication Sig Start Date End Date Taking? Authorizing Provider  acetaminophen (TYLENOL) 325 MG tablet Take 2 tablets (650 mg total) by mouth every 6 (six) hours as needed for mild pain (or Fever >/= 101). 12/01/19   Shon Hale, MD  albuterol (VENTOLIN HFA) 108 (90 Base) MCG/ACT inhaler 2 PUFFS EVERY 6 HOURS AS NEEDED FOR WHEEZING OR SHORTNESS OF BREATH Patient taking differently: Inhale 2 puffs into the lungs every 6 (six) hours as needed for wheezing or shortness of breath.  11/20/19   Dettinger, Elige Radon, MD  aspirin EC 81 MG tablet Take 1 tablet (81 mg total) by mouth daily with breakfast. 12/01/19 11/30/20  Shon Hale, MD  busPIRone (BUSPAR) 10 MG tablet Take 10 mg by mouth 3 (three) times daily.    [provider]  esomeprazole (NEXIUM) 40 MG capsule TAKE (1) CAPSULE DAILY Patient taking differently: Take 40 mg by mouth daily. TAKE (1) CAPSULE DAILY 11/20/19   Dettinger, Elige RadonJoshua A, MD  folic acid (FOLVITE) 1 MG tablet Take 1 tablet (1 mg total) by mouth daily. 12/02/19   Emokpae, Courage, MD  ipratropium-albuterol (DUONEB) 0.5-2.5 (3) MG/3ML SOLN Take 3 mLs by nebulization every 4 (four) hours as needed. Patient taking differently: Take 3 mLs by nebulization every 4 (four) hours as needed  (for shortness of breath/wheezing).  11/02/18 12/03/19  Dettinger, Elige RadonJoshua A, MD  Melatonin 10 MG TABS Take 10 mg by mouth at bedtime. 08/09/19   Dettinger, Elige RadonJoshua A, MD  metoprolol succinate (TOPROL-XL) 25 MG 24 hr tablet Take 1 tablet (25 mg total) by mouth daily. 11/20/19   Dettinger, Elige RadonJoshua A, MD  mirabegron ER (MYRBETRIQ) 25 MG TB24 tablet Take 1 tablet (25 mg total) by mouth daily. 04/05/19   Dettinger, Elige RadonJoshua A, MD  Multiple Vitamin (MULTIVITAMIN WITH MINERALS) TABS tablet Take 1 tablet by mouth daily. 12/02/19   Shon HaleEmokpae, Courage, MD  mupirocin ointment (BACTROBAN) 2 % PLACE 1 APPLICATION INTO THE NOSE 2 TIMES A DAY Patient taking differently: Apply 1 application topically 2 (two) times daily.  06/12/19   Sonny Mastersakes, Linda M, FNP  niacin (NIASPAN) 1000 MG CR tablet TAKE 1 TABLET AT BEDTIME Patient taking differently: Take 1,000 mg by mouth at bedtime.  11/20/19   Dettinger, Elige RadonJoshua A, MD  PARoxetine (PAXIL) 20 MG tablet Take 1 tablet (20 mg total) by mouth daily. 11/07/19   Dettinger, Elige RadonJoshua A, MD  potassium chloride (KLOR-CON) 10 MEQ tablet Take 1 tablet (10 mEq total) by mouth daily. 12/03/19   Bethann BerkshireZammit, Matthewjames Petrasek, MD  pramipexole (MIRAPEX) 0.5 MG tablet Take 2 tablets (1 mg total) by mouth at bedtime. 12/01/19   Shon HaleEmokpae, Courage, MD  risperidone (RISPERDAL) 2 MG tablet Take 1 tablet (2 mg total) by mouth at bedtime. 01/18/20   Dettinger, Elige RadonJoshua A, MD  simvastatin (ZOCOR) 20 MG tablet Take 1 tablet (20 mg total) by mouth at bedtime. 11/02/18   Dettinger, Elige RadonJoshua A, MD  thiamine 100 MG tablet Take 1 tablet (100 mg total) by mouth daily. 12/02/19   Shon HaleEmokpae, Courage, MD    Allergies    Clonidine  Review of Systems   Review of Systems  Constitutional: Negative for appetite change and fatigue.  HENT: Negative for congestion, ear discharge and sinus pressure.   Eyes: Negative for discharge.  Respiratory: Negative for cough.   Cardiovascular: Negative for chest pain.  Gastrointestinal: Negative for abdominal pain  and diarrhea.  Genitourinary: Negative for frequency and hematuria.  Musculoskeletal: Negative for back pain.  Skin: Negative for rash.  Neurological: Negative for seizures and headaches.       Slurred speech  Psychiatric/Behavioral: Negative for hallucinations.    Physical Exam Updated Vital Signs BP (!) 135/55   Pulse 61   Temp 98.5 F (36.9 C) (Oral)   Resp (!) 27   Ht 5\' 7"  (1.702 m)   Wt 62.6 kg   SpO2 98%   BMI 21.61 kg/m   Physical Exam Vitals and nursing note reviewed.  Constitutional:      Appearance: He is well-developed.  HENT:     Head: Normocephalic.     Right Ear: Tympanic membrane normal.     Nose: Nose normal.  Eyes:  General: No scleral icterus.    Conjunctiva/sclera: Conjunctivae normal.  Neck:     Thyroid: No thyromegaly.  Cardiovascular:     Rate and Rhythm: Normal rate and regular rhythm.     Heart sounds: No murmur heard.  No friction rub. No gallop.   Pulmonary:     Breath sounds: No stridor. No wheezing or rales.  Chest:     Chest wall: No tenderness.  Abdominal:     General: There is no distension.     Tenderness: There is no abdominal tenderness. There is no rebound.  Musculoskeletal:        General: Normal range of motion.     Cervical back: Neck supple.  Lymphadenopathy:     Cervical: No cervical adenopathy.  Skin:    Findings: No erythema or rash.  Neurological:     Mental Status: He is alert and oriented to person, place, and time.     Motor: No abnormal muscle tone.     Coordination: Coordination normal.  Psychiatric:        Behavior: Behavior normal.     ED Results / Procedures / Treatments   Labs (all labs ordered are listed, but only abnormal results are displayed) Labs Reviewed  CBC WITH DIFFERENTIAL/PLATELET - Abnormal; Notable for the following components:      Result Value   RBC 3.93 (*)    Hemoglobin 12.9 (*)    MCV 102.8 (*)    All other components within normal limits  COMPREHENSIVE METABOLIC PANEL -  Abnormal; Notable for the following components:   Potassium 5.4 (*)    Calcium 8.8 (*)    Total Bilirubin 1.5 (*)    All other components within normal limits  BASIC METABOLIC PANEL - Abnormal; Notable for the following components:   Potassium 5.5 (*)    CO2 19 (*)    Creatinine, Ser 0.60 (*)    All other components within normal limits    EKG EKG Interpretation  Date/Time:  Friday February 02 2020 13:42:03 EDT Ventricular Rate:  55 PR Interval:    QRS Duration: 102 QT Interval:  448 QTC Calculation: 429 R Axis:   75 Text Interpretation: Sinus rhythm Consider left atrial enlargement Minimal ST depression, inferior leads Borderline ST elevation, anterior leads Confirmed by Bethann Berkshire [phone]) on 02/02/2020 4:30:57 PM   Radiology CT Head Wo Contrast  Result Date: 02/02/2020 CLINICAL DATA:  Neuro deficit, acute, stroke suspected. Additional history provided: Slurred speech. EXAM: CT HEAD WITHOUT CONTRAST TECHNIQUE: Contiguous axial images were obtained from the base of the skull through the vertex without intravenous contrast. COMPARISON:  Brain MRI 12/05/2019, head CT 12/03/2019 FINDINGS: Brain: Stable, mild generalized parenchymal atrophy. Redemonstrated chronic lacunar infarcts within the left corona radiata, basal ganglia and bilateral thalami. Stable background moderate multifocal T2/FLAIR hyperintensity within the cerebral white matter which is nonspecific, but consistent with chronic small vessel ischemic disease. There is no acute intracranial hemorrhage. No demarcated cortical infarct is identified. No extra-axial fluid collection. No evidence of intracranial mass. No midline shift. Vascular: No hyperdense vessel.  Atherosclerotic calcifications. Skull: Left parietal calvarial burr holes. No calvarial fracture. No suspicious osseous lesion. Sinuses/Orbits: Visualized orbits show no acute finding. Mild ethmoid sinus mucosal thickening. Moderate left maxillary sinus mucosal thickening  with associated chronic reactive osteitis. No significant mastoid effusion. IMPRESSION: No CT evidence of acute intracranial abnormality. Stable mild generalized parenchymal atrophy and moderate chronic small vessel ischemic disease with multiple chronic small-vessel infarcts as detailed. Paranasal sinus mucosal thickening,  most notably left maxillary. Electronically Signed   By: Jackey Loge DO   On: 02/02/2020 16:57    Procedures Procedures (including critical care time)  Medications Ordered in ED Medications  sodium chloride 0.9 % bolus 500 mL (0 mLs Intravenous Stopped 02/02/20 1709)    ED Course  I have reviewed the triage vital signs and the nursing notes.  Pertinent labs & imaging results that were available during my care of the patient were reviewed by me and considered in my medical decision making (see chart for details).    MDM Rules/Calculators/A&P                          Patient with slurred speech that resolved in the hour.  Patient states he is back to normal.  He was seen by telemetry neurology and they felt like he needed to be admitted for a TIA work-up with an MRI in the next 24 to 48 hours       This patient presents to the ED for concern of slurred speech, this involves an extensive number of treatment options, and is a complaint that carries with it a high risk of complications and morbidity.  The differential diagnosis includes stroke   Lab Tests:   I Ordered, reviewed, and interpreted labs, which included CBC and chemistries which showed mild anemia  Medicines ordered:   I ordered medication normal saline for dehydration  Imaging Studies ordered:   I ordered imaging studies which included CT head  I independently visualized and interpreted imaging which showed no acute disease  Additional history obtained:   Additional history obtained from record  Previous records obtained and reviewed.  Consultations Obtained:   I consulted neurology  and hospitalist and discussed lab and imaging findings  Reevaluation:  After the interventions stated above, I reevaluated the patient and found improved  Critical Interventions:  .   Final Clinical Impression(s) / ED Diagnoses Final diagnoses:  None    Rx / DC Orders ED Discharge Orders    None       Bethann Berkshire, MD 02/05/20 1041

## 2020-02-02 NOTE — H&P (Signed)
TRH H&P    Patient Demographics:    [name], is a 64 y.o. male  MRN: [id number]  DOB - 11-Nov-1955  Admit Date - 02/02/2020  Referring MD/NP/PA: Dr. Estell Harpin  Outpatient Primary MD for the patient is Dettinger, Elige Radon, MD  Patient coming from: Home  Chief complaint- Slurred Speech   HPI:    [name]  is a 64 y.o. male, history of sleep apnea, seizures, TBI, hypertension, hyperlipidemia, GERD, depression, COPD, cerebral aneurysm, anxiety, and more presents to the ED with a chief complaint of slurred speech.  Of note patient speech is slurred at baseline and difficult to understand.  Sister is at bedside and she reports that patient had slurred speech that started 2 days ago.  It lasted for an hour at that time.  It came back today, and that is when she was more concerned so she called EMS.  The slurred speech is associated with right-sided weakness, and right upper extremity more than right lower extremity.  All of these symptoms resolved after an hour.  Patient reports no headache, no change in vision, no change in hearing.  He reports some burning substernal chest pain during the episode.  That has also resolved.  Patient declines palpitations, syncope, tremors, nausea, vomiting, loss of sensation.  Sister reports that in 2007 he had the same constellation of symptoms, but she does not remember the diagnosis or what was done.  Patient's major concern at the time of my exam is shoulder pain.  His left shoulder pain that started today.  It is achy, nonradiating.  It is worse with movement of his arm, rest makes it better.  Patient reports that the pain is 10 out of 10 however he appears quite comfortable. Patient smokes a pack and a half a day. Patient has a history of alcohol abuse and quit drinking years ago. Patient denies illicit drug use.  In the ED Temperature 98.5, heart rate 61, respiratory  rate 27, blood pressure 135/55, satting 98% on room air White blood cell count 9.3, hemoglobin 12.9 Chemistry panel shows a potassium of 5.5 CT head shows no acute intracranial abnormality 500 mL bolus given in ED Telemetry neuro consult recommends MRI in the a.m. Patient transferring to [facility] as we do not have MRI available on the weekends    Review of systems:    In addition to the HPI above,  No Fever-chills, No Headache, No changes with Vision or hearing, No problems swallowing food or Liquids, Admits to burning Chest pain, No Cough or Shortness of Breath, No Abdominal pain, No Nausea or Vomiting, bowel movements are regular, No Blood in stool or Urine, No dysuria, No new skin rashes or bruises, New left shoulder pain  Right upper extremity weakness, increased slurring of speech, no tingling, numbness in any extremity, No recent weight gain or loss, No polyuria, polydypsia or polyphagia, No significant Mental Stressors.  All other systems reviewed and are negative.    Past History of the following :    Past Medical History:  Diagnosis  Date  . Anxiety   . Cerebral aneurysm   . Closed head injury   . COPD (chronic obstructive pulmonary disease) (HCC)   . Depression   . GERD (gastroesophageal reflux disease)   . Hyperlipidemia   . Hypertension   . Seizures (HCC)   . Sleep apnea       History reviewed. No pertinent surgical history.    Social History:      Social History   Tobacco Use  . Smoking status: Current Every Day Smoker    Packs/day: 1.00    Years: 50.00    Pack years: 50.00  . Smokeless tobacco: Never Used  Substance Use Topics  . Alcohol use: No       Family History :     Family History  Problem Relation Age of Onset  . Obesity Sister    Reviewed   Home Medications:   Prior to Admission medications   Medication Sig Start Date End Date Taking? Authorizing Provider  acetaminophen (TYLENOL) 325 MG tablet Take 2 tablets (650 mg  total) by mouth every 6 (six) hours as needed for mild pain (or Fever >/= 101). 12/01/19   Shon HaleEmokpae, Courage, MD  albuterol (VENTOLIN HFA) 108 (90 Base) MCG/ACT inhaler 2 PUFFS EVERY 6 HOURS AS NEEDED FOR WHEEZING OR SHORTNESS OF BREATH Patient taking differently: Inhale 2 puffs into the lungs every 6 (six) hours as needed for wheezing or shortness of breath.  11/20/19   Dettinger, Elige RadonJoshua A, MD  aspirin EC 81 MG tablet Take 1 tablet (81 mg total) by mouth daily with breakfast. 12/01/19 11/30/20  Shon HaleEmokpae, Courage, MD  busPIRone (BUSPAR) 10 MG tablet Take 10 mg by mouth 3 (three) times daily.    [provider]  esomeprazole (NEXIUM) 40 MG capsule TAKE (1) CAPSULE DAILY Patient taking differently: Take 40 mg by mouth daily. TAKE (1) CAPSULE DAILY 11/20/19   Dettinger, Elige RadonJoshua A, MD  folic acid (FOLVITE) 1 MG tablet Take 1 tablet (1 mg total) by mouth daily. 12/02/19   Emokpae, Courage, MD  ipratropium-albuterol (DUONEB) 0.5-2.5 (3) MG/3ML SOLN Take 3 mLs by nebulization every 4 (four) hours as needed. Patient taking differently: Take 3 mLs by nebulization every 4 (four) hours as needed (for shortness of breath/wheezing).  11/02/18 12/03/19  Dettinger, Elige RadonJoshua A, MD  Melatonin 10 MG TABS Take 10 mg by mouth at bedtime. 08/09/19   Dettinger, Elige RadonJoshua A, MD  metoprolol succinate (TOPROL-XL) 25 MG 24 hr tablet Take 1 tablet (25 mg total) by mouth daily. 11/20/19   Dettinger, Elige RadonJoshua A, MD  mirabegron ER (MYRBETRIQ) 25 MG TB24 tablet Take 1 tablet (25 mg total) by mouth daily. 04/05/19   Dettinger, Elige RadonJoshua A, MD  Multiple Vitamin (MULTIVITAMIN WITH MINERALS) TABS tablet Take 1 tablet by mouth daily. 12/02/19   Shon HaleEmokpae, Courage, MD  mupirocin ointment (BACTROBAN) 2 % PLACE 1 APPLICATION INTO THE NOSE 2 TIMES A DAY Patient taking differently: Apply 1 application topically 2 (two) times daily.  06/12/19   Sonny Mastersakes, Linda M, FNP  niacin (NIASPAN) 1000 MG CR tablet TAKE 1 TABLET AT BEDTIME Patient taking differently: Take  1,000 mg by mouth at bedtime.  11/20/19   Dettinger, Elige RadonJoshua A, MD  PARoxetine (PAXIL) 20 MG tablet Take 1 tablet (20 mg total) by mouth daily. 11/07/19   Dettinger, Elige RadonJoshua A, MD  potassium chloride (KLOR-CON) 10 MEQ tablet Take 1 tablet (10 mEq total) by mouth daily. 12/03/19   Bethann BerkshireZammit, Joseph, MD  pramipexole (MIRAPEX) 0.5 MG tablet Take  2 tablets (1 mg total) by mouth at bedtime. 12/01/19   Shon Hale, MD  risperidone (RISPERDAL) 2 MG tablet Take 1 tablet (2 mg total) by mouth at bedtime. 01/18/20   Dettinger, Elige Radon, MD  simvastatin (ZOCOR) 20 MG tablet Take 1 tablet (20 mg total) by mouth at bedtime. 11/02/18   Dettinger, Elige Radon, MD  thiamine 100 MG tablet Take 1 tablet (100 mg total) by mouth daily. 12/02/19   Shon Hale, MD     Allergies:     Allergies  Allergen Reactions  . Clonidine Rash     Physical Exam:   Vitals  Blood pressure (!) 135/55, pulse 61, temperature 98.5 F (36.9 C), temperature source Oral, resp. rate (!) 27, height 5\' 7"  (1.702 m), weight 62.6 kg, SpO2 98 %.  1.  General: Supine in bed in no acute distress  2. Psychiatric: Mood and behavior normal for situation Patient alert to self and place but not to time  3. Neurologic: Cranial nerves 2 through 12 are intact Equal strength in the upper extremities bilaterally Equal strength in the lower extremities bilaterally Normal heel-to-shin Equal sensation in the upper and lower extremities bilaterally  4. HEENMT:  Head is atraumatic, normocephalic, pupils are reactive to light, chronic fluid-filled blister under right eye, mucous membranes moist, neck is supple, trachea is midline  5. Respiratory : 6 lungs are clear to auscultation bilaterally with  6. Cardiovascular : Heart rate is normal rhythm is regular, no murmurs rubs or gallops  7. Gastrointestinal:  Abdomen is soft, nondistended, nontender to palpation  8. Skin:  No acute lesions on limited skin exam  9.Musculoskeletal:  No acute  deformity, no peripheral edema    Data Review:    CBC Recent Labs  Lab 02/02/20 1514  WBC 9.3  HGB 12.9*  HCT 40.4  PLT 189  MCV 102.8*  MCH 32.8  MCHC 31.9  RDW 13.2  LYMPHSABS 2.3  MONOABS 0.8  EOSABS 0.1  BASOSABS 0.1   ------------------------------------------------------------------------------------------------------------------  Results for orders placed or performed during the hospital encounter of 02/02/20 (from the past 48 hour(s))  CBC with Differential/Platelet     Status: Abnormal   Collection Time: 02/02/20  3:14 PM  Result Value Ref Range   WBC 9.3 4.0 - 10.5 K/uL   RBC 3.93 (L) 4.22 - 5.81 MIL/uL   Hemoglobin 12.9 (L) 13.0 - 17.0 g/dL   HCT 02/04/20 39 - 52 %   MCV 102.8 (H) 80.0 - 100.0 fL   MCH 32.8 26.0 - 34.0 pg   MCHC 31.9 30.0 - 36.0 g/dL   RDW 89.3 81.0 - 17.5 %   Platelets 189 150 - 400 K/uL   nRBC 0.0 0.0 - 0.2 %   Neutrophils Relative % 65 %   Neutro Abs 6.0 1.7 - 7.7 K/uL   Lymphocytes Relative 25 %   Lymphs Abs 2.3 0.7 - 4.0 K/uL   Monocytes Relative 8 %   Monocytes Absolute 0.8 0 - 1 K/uL   Eosinophils Relative 1 %   Eosinophils Absolute 0.1 0 - 0 K/uL   Basophils Relative 1 %   Basophils Absolute 0.1 0 - 0 K/uL   Immature Granulocytes 0 %   Abs Immature Granulocytes 0.03 0.00 - 0.07 K/uL    Comment: Performed at [facility], [address] [place], [place]  Comprehensive metabolic panel     Status: Abnormal   Collection Time: 02/02/20  3:14 PM  Result Value Ref Range  Sodium 137 135 - 145 mmol/L   Potassium 5.4 (H) 3.5 - 5.1 mmol/L   Chloride 106 98 - 111 mmol/L   CO2 22 22 - 32 mmol/L   Glucose, Bld 99 70 - 99 mg/dL    Comment: Glucose reference range applies only to samples taken after fasting for at least 8 hours.   BUN 16 8 - 23 mg/dL   Creatinine, Ser 1.61 0.61 - 1.24 mg/dL   Calcium 8.8 (L) 8.9 - 10.3 mg/dL   Total Protein 6.9 6.5 - 8.1 g/dL   Albumin 3.8 3.5 - 5.0 g/dL   AST 29 15 - 41 U/L   ALT 12 0 - 44  U/L   Alkaline Phosphatase 69 38 - 126 U/L   Total Bilirubin 1.5 (H) 0.3 - 1.2 mg/dL   GFR calc non Af Amer >60 >60 mL/min   GFR calc Af Amer >60 >60 mL/min   Anion gap 9 5 - 15    Comment: Performed at [facility], [address], [place], [place] [address]  Basic metabolic panel     Status: Abnormal   Collection Time: 02/02/20  3:14 PM  Result Value Ref Range   Sodium 138 135 - 145 mmol/L   Potassium 5.5 (H) 3.5 - 5.1 mmol/L   Chloride 106 98 - 111 mmol/L   CO2 19 (L) 22 - 32 mmol/L   Glucose, Bld 99 70 - 99 mg/dL    Comment: Glucose reference range applies only to samples taken after fasting for at least 8 hours.   BUN 16 8 - 23 mg/dL   Creatinine, Ser 5.40 (L) 0.61 - 1.24 mg/dL   Calcium 8.9 8.9 - 98.1 mg/dL   GFR calc non Af Amer >60 >60 mL/min   GFR calc Af Amer >60 >60 mL/min   Anion gap 13 5 - 15    Comment: Performed at [facility], [address] [place], [place] [address]    Chemistries  Recent Labs  Lab 02/02/20 1514  NA 138  137  K 5.5*  5.4*  CL 106  106  CO2 19*  22  GLUCOSE 99  99  BUN 16  16  CREATININE 0.60*  0.62  CALCIUM 8.9  8.8*  AST 29  ALT 12  ALKPHOS 69  BILITOT 1.5*   ------------------------------------------------------------------------------------------------------------------  ------------------------------------------------------------------------------------------------------------------ GFR: Estimated Creatinine Clearance: 80.4 mL/min (by C-G formula based on SCr of 0.62 mg/dL). Liver Function Tests: Recent Labs  Lab 02/02/20 1514  AST 29  ALT 12  ALKPHOS 69  BILITOT 1.5*  PROT 6.9  ALBUMIN 3.8   No results for input(s): LIPASE, AMYLASE in the last 168 hours. No results for input(s): AMMONIA in the last 168 hours. Coagulation Profile: No results for input(s): INR, PROTIME in the last 168 hours. Cardiac Enzymes: No results for input(s): CKTOTAL, CKMB, CKMBINDEX, TROPONINI in the last 168 hours. BNP (last 3  results) No results for input(s): PROBNP in the last 8760 hours. HbA1C: No results for input(s): HGBA1C in the last 72 hours. CBG: No results for input(s): GLUCAP in the last 168 hours. Lipid Profile: No results for input(s): CHOL, HDL, LDLCALC, TRIG, CHOLHDL, LDLDIRECT in the last 72 hours. Thyroid Function Tests: No results for input(s): TSH, T4TOTAL, FREET4, T3FREE, THYROIDAB in the last 72 hours. Anemia Panel: No results for input(s): VITAMINB12, FOLATE, FERRITIN, TIBC, IRON, RETICCTPCT in the last 72 hours.  --------------------------------------------------------------------------------------------------------------- Urine analysis:    Component Value Date/Time   COLORURINE STRAW (A) 12/03/2019 1932  APPEARANCEUR CLEAR 12/03/2019 1932   APPEARANCEUR Clear 11/12/2017 1144   LABSPEC 1.004 (L) 12/03/2019 1932   PHURINE 7.0 12/03/2019 1932   GLUCOSEU NEGATIVE 12/03/2019 1932   HGBUR SMALL (A) 12/03/2019 1932   BILIRUBINUR NEGATIVE 12/03/2019 1932   BILIRUBINUR Negative 11/12/2017 1144   KETONESUR 5 (A) 12/03/2019 1932   PROTEINUR NEGATIVE 12/03/2019 1932   UROBILINOGEN negative 11/04/2012 1302   NITRITE NEGATIVE 12/03/2019 1932   LEUKOCYTESUR NEGATIVE 12/03/2019 1932      Imaging Results:    CT Head Wo Contrast  Result Date: 02/02/2020 CLINICAL DATA:  Neuro deficit, acute, stroke suspected. Additional history provided: Slurred speech. EXAM: CT HEAD WITHOUT CONTRAST TECHNIQUE: Contiguous axial images were obtained from the base of the skull through the vertex without intravenous contrast. COMPARISON:  Brain MRI 12/05/2019, head CT 12/03/2019 FINDINGS: Brain: Stable, mild generalized parenchymal atrophy. Redemonstrated chronic lacunar infarcts within the left corona radiata, basal ganglia and bilateral thalami. Stable background moderate multifocal T2/FLAIR hyperintensity within the cerebral white matter which is nonspecific, but consistent with chronic small vessel ischemic  disease. There is no acute intracranial hemorrhage. No demarcated cortical infarct is identified. No extra-axial fluid collection. No evidence of intracranial mass. No midline shift. Vascular: No hyperdense vessel.  Atherosclerotic calcifications. Skull: Left parietal calvarial burr holes. No calvarial fracture. No suspicious osseous lesion. Sinuses/Orbits: Visualized orbits show no acute finding. Mild ethmoid sinus mucosal thickening. Moderate left maxillary sinus mucosal thickening with associated chronic reactive osteitis. No significant mastoid effusion. IMPRESSION: No CT evidence of acute intracranial abnormality. Stable mild generalized parenchymal atrophy and moderate chronic small vessel ischemic disease with multiple chronic small-vessel infarcts as detailed. Paranasal sinus mucosal thickening, most notably left maxillary. Electronically Signed   By: Jackey Loge DO   On: 02/02/2020 16:57    My personal review of EKG: Rhythm NSR, Rate 55 /min, QTc 429,no significant changes - EKG reads minimal ST changes - but my interpretation is that these are not changes but motion artifact   Assessment & Plan:    Active Problems:   TIA (transient ischemic attack)   1. TIA 1. Symptoms resolved within 1 hour 2. Tele neuro consulted - advised admission for MRI in the AM 3. Lipid panel and A1C pending 4. Monitor on tele 5. Echo in the AM 6. Carotid duplex US 7. MRI/MRA brain in the AM 8. PT/OT/ST eval 9. TIA order set utilized 10. Continue to monitor 2. Chronic anemia 1. Hgb at baseline 2. Continue folic acid 3. No active signs/symptoms of bleeidng 4. Trend cbc in the am 3. Psychiatric disorder 1. Continue buspar, paxil, and Risperdal 4. COPD, HTN, HLD 1. Continue home medications 5. Hyperkalemia 1. Recheck now 2. Monitor on tele 3. EKG without electrolyte related changes 6. Left shoulder pain 1. Musculoskeletal 2. Lidocaine patch   DVT Prophylaxis-   heparin - SCDs   AM Labs  Ordered, also please review Full Orders  Family Communication: Admission, patients condition and plan of care including tests being ordered have been discussed with the patient and sister who indicate understanding and agree with the plan and Code Status.  Code Status:  Full  Admission status: Observation    The patient's presenting symptoms include slurred speech for 1 hour The chronic co-morbidities include HTN, HLD       * I certify that at the point of admission it is my clinical judgment that the patient will require inpatient hospital care spanning beyond 2 midnights from the point of admission due to  high intensity of service, high risk for further deterioration and high frequency of surveillance required.*  Time spent in minutes : 64   Adalyne Lovick B Zierle-Ghosh DO

## 2020-02-02 NOTE — ED Triage Notes (Signed)
Pt's family members called EMS for slurred speech. When patient got into ambulance he was fine. He has got a shuffled gait, which is normal since 2012.  Pt breathes hard at nighttime. Has appointment for lung specialist. Told CNA that he didn't feel right. Would not give specifics. She states he is gasping for air and he couldn't move.   Pt states he has nothing wrong with him and that he is fine.

## 2020-02-03 ENCOUNTER — Observation Stay (HOSPITAL_COMMUNITY): Payer: Medicare Other

## 2020-02-03 DIAGNOSIS — N3281 Overactive bladder: Secondary | ICD-10-CM | POA: Diagnosis not present

## 2020-02-03 DIAGNOSIS — R471 Dysarthria and anarthria: Secondary | ICD-10-CM | POA: Diagnosis not present

## 2020-02-03 DIAGNOSIS — G4730 Sleep apnea, unspecified: Secondary | ICD-10-CM | POA: Diagnosis not present

## 2020-02-03 DIAGNOSIS — Z79890 Hormone replacement therapy (postmenopausal): Secondary | ICD-10-CM | POA: Diagnosis not present

## 2020-02-03 DIAGNOSIS — F419 Anxiety disorder, unspecified: Secondary | ICD-10-CM | POA: Diagnosis not present

## 2020-02-03 DIAGNOSIS — G459 Transient cerebral ischemic attack, unspecified: Secondary | ICD-10-CM | POA: Diagnosis present

## 2020-02-03 DIAGNOSIS — F17200 Nicotine dependence, unspecified, uncomplicated: Secondary | ICD-10-CM | POA: Diagnosis not present

## 2020-02-03 DIAGNOSIS — R4701 Aphasia: Secondary | ICD-10-CM | POA: Diagnosis not present

## 2020-02-03 DIAGNOSIS — G8929 Other chronic pain: Secondary | ICD-10-CM | POA: Diagnosis not present

## 2020-02-03 DIAGNOSIS — E860 Dehydration: Secondary | ICD-10-CM | POA: Diagnosis not present

## 2020-02-03 DIAGNOSIS — K219 Gastro-esophageal reflux disease without esophagitis: Secondary | ICD-10-CM | POA: Diagnosis not present

## 2020-02-03 DIAGNOSIS — X58XXXS Exposure to other specified factors, sequela: Secondary | ICD-10-CM | POA: Diagnosis not present

## 2020-02-03 DIAGNOSIS — M19012 Primary osteoarthritis, left shoulder: Secondary | ICD-10-CM | POA: Diagnosis not present

## 2020-02-03 DIAGNOSIS — J449 Chronic obstructive pulmonary disease, unspecified: Secondary | ICD-10-CM | POA: Diagnosis not present

## 2020-02-03 DIAGNOSIS — R270 Ataxia, unspecified: Secondary | ICD-10-CM | POA: Diagnosis not present

## 2020-02-03 DIAGNOSIS — Z79899 Other long term (current) drug therapy: Secondary | ICD-10-CM | POA: Diagnosis not present

## 2020-02-03 DIAGNOSIS — Z20822 Contact with and (suspected) exposure to covid-19: Secondary | ICD-10-CM | POA: Diagnosis not present

## 2020-02-03 DIAGNOSIS — D649 Anemia, unspecified: Secondary | ICD-10-CM | POA: Diagnosis not present

## 2020-02-03 DIAGNOSIS — Z7982 Long term (current) use of aspirin: Secondary | ICD-10-CM | POA: Diagnosis not present

## 2020-02-03 DIAGNOSIS — F329 Major depressive disorder, single episode, unspecified: Secondary | ICD-10-CM | POA: Diagnosis not present

## 2020-02-03 DIAGNOSIS — S069X0S Unspecified intracranial injury without loss of consciousness, sequela: Secondary | ICD-10-CM | POA: Diagnosis not present

## 2020-02-03 DIAGNOSIS — E875 Hyperkalemia: Secondary | ICD-10-CM | POA: Diagnosis not present

## 2020-02-03 DIAGNOSIS — G40909 Epilepsy, unspecified, not intractable, without status epilepticus: Secondary | ICD-10-CM | POA: Diagnosis not present

## 2020-02-03 DIAGNOSIS — I10 Essential (primary) hypertension: Secondary | ICD-10-CM | POA: Diagnosis not present

## 2020-02-03 DIAGNOSIS — G8324 Monoplegia of upper limb affecting left nondominant side: Secondary | ICD-10-CM | POA: Diagnosis not present

## 2020-02-03 DIAGNOSIS — E785 Hyperlipidemia, unspecified: Secondary | ICD-10-CM | POA: Diagnosis not present

## 2020-02-03 LAB — LIPID PANEL
Cholesterol: 130 mg/dL (ref 0–200)
HDL: 57 mg/dL (ref >40)
LDL Cholesterol: 64 mg/dL (ref 0–99)
Total CHOL/HDL Ratio: 2.3 RATIO
Triglycerides: 44 mg/dL (ref <150)
VLDL: 9 mg/dL (ref 0–40)

## 2020-02-03 LAB — COMPREHENSIVE METABOLIC PANEL
ALT: 13 U/L (ref 0–44)
AST: 18 U/L (ref 15–41)
Albumin: 3.6 g/dL (ref 3.5–5.0)
Alkaline Phosphatase: 69 U/L (ref 38–126)
Anion gap: 10 (ref 5–15)
BUN: 17 mg/dL (ref 8–23)
CO2: 23 mmol/L (ref 22–32)
Calcium: 8.9 mg/dL (ref 8.9–10.3)
Chloride: 105 mmol/L (ref 98–111)
Creatinine, Ser: 0.67 mg/dL (ref 0.61–1.24)
GFR calc Af Amer: >60 mL/min (ref >60)
GFR calc non Af Amer: >60 mL/min (ref >60)
Glucose, Bld: 81 mg/dL (ref 70–99)
Potassium: 3.6 mmol/L (ref 3.5–5.1)
Sodium: 138 mmol/L (ref 135–145)
Total Bilirubin: 1.1 mg/dL (ref 0.3–1.2)
Total Protein: 6.8 g/dL (ref 6.5–8.1)

## 2020-02-03 LAB — CBC
HCT: 36.7 % — ABNORMAL LOW (ref 39.0–52.0)
Hemoglobin: 12 g/dL — ABNORMAL LOW (ref 13.0–17.0)
MCH: 32.7 pg (ref 26.0–34.0)
MCHC: 32.7 g/dL (ref 30.0–36.0)
MCV: 100 fL (ref 80.0–100.0)
Platelets: 223 10*3/uL (ref 150–400)
RBC: 3.67 MIL/uL — ABNORMAL LOW (ref 4.22–5.81)
RDW: 13.1 % (ref 11.5–15.5)
WBC: 7.9 10*3/uL (ref 4.0–10.5)
nRBC: 0 % (ref 0.0–0.2)

## 2020-02-03 LAB — URINALYSIS, DIPSTICK ONLY
Bilirubin Urine: NEGATIVE
Glucose, UA: NEGATIVE mg/dL
Hgb urine dipstick: NEGATIVE
Ketones, ur: 20 mg/dL — AB
Leukocytes,Ua: NEGATIVE
Nitrite: NEGATIVE
Protein, ur: NEGATIVE mg/dL
Specific Gravity, Urine: 1.023 (ref 1.005–1.030)
pH: 6 (ref 5.0–8.0)

## 2020-02-03 LAB — ECHOCARDIOGRAM LIMITED
Area-P 1/2: 2.55 cm2
Height: 67 in
S' Lateral: 3.56 cm
Weight: 2208 oz

## 2020-02-03 LAB — RAPID URINE DRUG SCREEN, HOSP PERFORMED
Amphetamines: NOT DETECTED
Barbiturates: NOT DETECTED
Benzodiazepines: NOT DETECTED
Cocaine: NOT DETECTED
Opiates: NOT DETECTED
Tetrahydrocannabinol: NOT DETECTED

## 2020-02-03 LAB — VITAMIN D 25 HYDROXY (VIT D DEFICIENCY, FRACTURES): Vit D, 25-Hydroxy: 18.7 ng/mL — ABNORMAL LOW (ref 30–100)

## 2020-02-03 LAB — HEMOGLOBIN A1C
Hgb A1c MFr Bld: 6.1 % — ABNORMAL HIGH (ref 4.8–5.6)
Mean Plasma Glucose: 128.37 mg/dL

## 2020-02-03 MED ORDER — VITAMIN B-12 1000 MCG PO TABS
1000.0000 ug | ORAL_TABLET | Freq: Every day | ORAL | Status: DC
  Administered 2020-02-03 – 2020-02-05 (×3): 1000 ug via ORAL
  Filled 2020-02-03 (×3): qty 1

## 2020-02-03 MED ORDER — ALBUTEROL SULFATE (2.5 MG/3ML) 0.083% IN NEBU: 2.5000 mg | INHALATION_SOLUTION | Freq: Four times a day (QID) | RESPIRATORY_TRACT | Status: DC | PRN

## 2020-02-03 NOTE — Consult Note (Signed)
NEUROLOGY CONSULT  Reason for Consult: transient speech problems Referring Physician: Dr Gwenlyn Perking  CC: left shoulder pain with elevation  HPI: [name] is an 64 y.o. male w/PMH of HTN, HLD, CAD, TBI with resultant chronic dysarthria and remote seizures (not on ASD). On 8/20 family called EMS because he his speech was suddenly worse while they were speaking on the phone. By the time EMS arrived he was back to baseline dysarthria. The pt does not know why his family called EMS, does not feel he is having any difficulty with his speech. He doesn't remember the event of speech change during his phone call with his sister. He reports his last seizure was five years ago.  Past Medical History Past Medical History:  Diagnosis Date  . Anxiety   . Cerebral aneurysm   . Closed head injury   . COPD (chronic obstructive pulmonary disease) (HCC)   . Depression   . GERD (gastroesophageal reflux disease)   . Hyperlipidemia   . Hypertension   . Seizures (HCC)   . Sleep apnea     Past Surgical History History reviewed. No pertinent surgical history.  Family History Family History  Problem Relation Age of Onset  . Obesity Sister     Social History    reports that he has been smoking. He has a 50.00 pack-year smoking history. He has never used smokeless tobacco. He reports current drug use. Drug: Marijuana. He reports that he does not drink alcohol.  Allergies Allergies  Allergen Reactions  . Clonidine Rash    Home Medications Medications Prior to Admission  Medication Sig Dispense Refill  . acetaminophen (TYLENOL) 325 MG tablet Take 2 tablets (650 mg total) by mouth every 6 (six) hours as needed for mild pain (or Fever >/= 101). 12 tablet 0  . albuterol (VENTOLIN HFA) 108 (90 Base) MCG/ACT inhaler 2 PUFFS EVERY 6 HOURS AS NEEDED FOR WHEEZING OR SHORTNESS OF BREATH (Patient taking differently: Inhale 2 puffs into the lungs every 6 (six) hours as needed for wheezing or shortness of  breath. ) 18 g 1  . aspirin EC 81 MG tablet Take 1 tablet (81 mg total) by mouth daily with breakfast. 30 tablet 2  . busPIRone (BUSPAR) 10 MG tablet Take 10 mg by mouth 3 (three) times daily.    Marland Kitchen esomeprazole (NEXIUM) 40 MG capsule TAKE (1) CAPSULE DAILY (Patient taking differently: Take 40 mg by mouth daily. TAKE (1) CAPSULE DAILY) 30 capsule 5  . folic acid (FOLVITE) 1 MG tablet Take 1 tablet (1 mg total) by mouth daily. 30 tablet 2  . ipratropium-albuterol (DUONEB) 0.5-2.5 (3) MG/3ML SOLN Take 3 mLs by nebulization every 4 (four) hours as needed. (Patient taking differently: Take 3 mLs by nebulization every 4 (four) hours as needed (for shortness of breath/wheezing). ) 1620 mL 3  . Melatonin 10 MG TABS Take 10 mg by mouth at bedtime. 90 tablet 3  . metoprolol succinate (TOPROL-XL) 25 MG 24 hr tablet Take 1 tablet (25 mg total) by mouth daily. 90 tablet 1  . mirabegron ER (MYRBETRIQ) 25 MG TB24 tablet Take 1 tablet (25 mg total) by mouth daily. 30 tablet 11  . Multiple Vitamin (MULTIVITAMIN WITH MINERALS) TABS tablet Take 1 tablet by mouth daily. 30 tablet 1  . mupirocin ointment (BACTROBAN) 2 % PLACE 1 APPLICATION INTO THE NOSE 2 TIMES A DAY (Patient taking differently: Apply 1 application topically 2 (two) times daily. ) 22 g 5  . niacin (NIASPAN) 1000 MG  CR tablet TAKE 1 TABLET AT BEDTIME (Patient taking differently: Take 1,000 mg by mouth at bedtime. ) 90 tablet 1  . PARoxetine (PAXIL) 20 MG tablet Take 1 tablet (20 mg total) by mouth daily. 30 tablet 3  . potassium chloride (KLOR-CON) 10 MEQ tablet Take 1 tablet (10 mEq total) by mouth daily. 6 tablet 0  . pramipexole (MIRAPEX) 0.5 MG tablet Take 2 tablets (1 mg total) by mouth at bedtime. 90 tablet 1  . risperidone (RISPERDAL) 2 MG tablet Take 1 tablet (2 mg total) by mouth at bedtime. 90 tablet 3  . simvastatin (ZOCOR) 20 MG tablet Take 1 tablet (20 mg total) by mouth at bedtime. 90 tablet 3  . thiamine 100 MG tablet Take 1 tablet (100 mg  total) by mouth daily. 30 tablet 2    Hospital Medications . aspirin EC  81 mg Oral Q breakfast  . busPIRone  10 mg Oral TID  . folic acid  1 mg Oral Daily  . heparin  5,000 Units Subcutaneous Q8H  . melatonin  9 mg Oral QHS  . metoprolol succinate  25 mg Oral Daily  . mirabegron ER  25 mg Oral Daily  . niacin  1,000 mg Oral QHS  . pantoprazole  40 mg Oral Daily  . PARoxetine  20 mg Oral Daily  . pramipexole  1 mg Oral QHS  . risperiDONE  2 mg Oral QHS  . simvastatin  20 mg Oral QHS  . thiamine  100 mg Oral Daily  . vitamin B-12  1,000 mcg Oral Daily     ROS: History obtained from pt  General ROS: negative for - chills, fatigue, fever, night sweats, weight gain or weight loss Psychological ROS: negative for - behavioral disorder, hallucinations, memory difficulties, mood swings or suicidal ideation Ophthalmic ROS: negative for - blurry vision, double vision, eye pain or loss of vision ENT ROS: negative for - epistaxis, nasal discharge, oral lesions, sore throat, tinnitus or vertigo Allergy and Immunology ROS: negative for - hives or itchy/watery eyes Hematological and Lymphatic ROS: negative for - bleeding problems, bruising or swollen lymph nodes Endocrine ROS: negative for - galactorrhea, hair pattern changes, polydipsia/polyuria or temperature intolerance Respiratory ROS: negative for - cough, hemoptysis, shortness of breath or wheezing Cardiovascular ROS: negative for - chest pain, dyspnea on exertion, edema or irregular heartbeat Gastrointestinal ROS: negative for - abdominal pain, diarrhea, hematemesis, nausea/vomiting or stool incontinence Genito-Urinary ROS: negative for - dysuria, hematuria, incontinence or urinary frequency/urgency Musculoskeletal ROS: negative for - joint swelling or muscular weakness Neurological ROS: as noted in HPI Dermatological ROS: negative for rash and skin lesion changes   Physical Examination:  Vitals:   02/03/20 1114 02/03/20 1115  02/03/20 1238 02/03/20 1530  BP: 118/66 118/66 (!) 143/73 (!) 143/60  Pulse: 82 81 61 (!) 55  Resp:   20 19  Temp: 97.6 F (36.4 C)  98.4 F (36.9 C) 98.2 F (36.8 C)  TempSrc: Oral  Oral Oral  SpO2: 97% 97% 99% 98%  Weight:      Height:        General - no acute distress Heart - Regular rate and rhythm - no murmer Lungs - Clear to auscultation Abdomen - Soft - non tender Extremities - Distal pulses intact - no edema. Decreased ROM in left shoulder d/t chronic injury Skin - Warm and dry  Neurologic Examination:   Mental Status:  Alert, oriented except got month mixed up at first, then self corrected, thought content  appropriate. Speech without evidence of aphasia. Able to follow 3 step commands without difficulty. He has moderate dysarthria Cranial Nerves:  II-bilateral visual fields intact III/IV/VI-Pupils were equal and reacted. Extraocular movements were full.  V/VII-no facial numbness and no facial weakness.  VIII-hearing normal.  X-normal speech and symmetrical palatal movement.  XII-midline tongue extension  Motor: Right : Upper extremity   5/5    Left:     Upper extremity   5/5  Lower extremity   5/5     Lower extremity   5/5 Tone and bulk:normal tone throughout; no atrophy noted. Near constant movement of legs in bed, left leg <right. Sensory: Intact to light touch in all extremities. Deep Tendon Reflexes: 2/4 throughout Plantars: Downgoing bilaterally  Cerebellar: Normal finger to nose and heel to shin bilaterally. Gait: not tested   LABORATORY STUDIES:  Basic Metabolic Panel: Recent Labs  Lab 02/02/20 1514 02/02/20 2003 02/03/20 0734  NA 138  137  --  138  K 5.5*  5.4* 3.5 3.6  CL 106  106  --  105  CO2 19*  22  --  23  GLUCOSE 99  99  --  81  BUN 16  16  --  17  CREATININE 0.60*  0.62  --  0.67  CALCIUM 8.9  8.8*  --  8.9    Liver Function Tests: Recent Labs  Lab 02/02/20 1514 02/03/20 0734  AST 29 18  ALT 12 13  ALKPHOS 69 69   BILITOT 1.5* 1.1  PROT 6.9 6.8  ALBUMIN 3.8 3.6   No results for input(s): LIPASE, AMYLASE in the last 168 hours. No results for input(s): AMMONIA in the last 168 hours.  CBC: Recent Labs  Lab 02/02/20 1514 02/03/20 0734  WBC 9.3 7.9  NEUTROABS 6.0  --   HGB 12.9* 12.0*  HCT 40.4 36.7*  MCV 102.8* 100.0  PLT 189 223    Cardiac Enzymes: No results for input(s): CKTOTAL, CKMB, CKMBINDEX, TROPONINI in the last 168 hours.  BNP: Invalid input(s): POCBNP  CBG: No results for input(s): GLUCAP in the last 168 hours.  Microbiology:   Coagulation Studies: No results for input(s): LABPROT, INR in the last 72 hours.  Urinalysis:  Recent Labs  Lab 02/02/20 0736  COLORURINE YELLOW  LABSPEC 1.023  PHURINE 6.0  GLUCOSEU NEGATIVE  HGBUR NEGATIVE  BILIRUBINUR NEGATIVE  KETONESUR 20*  PROTEINUR NEGATIVE  NITRITE NEGATIVE  LEUKOCYTESUR NEGATIVE    Lipid Panel:     Component Value Date/Time   CHOL 130 02/03/2020 0734   CHOL 126 11/07/2019 1420   TRIG 44 02/03/2020 0734   TRIG 101 10/03/2013 1654   HDL 57 02/03/2020 0734   HDL 57 11/07/2019 1420   HDL 64 10/03/2013 1654   CHOLHDL 2.3 02/03/2020 0734   VLDL 9 02/03/2020 0734   LDLCALC 64 02/03/2020 0734   LDLCALC 57 11/07/2019 1420   LDLCALC 45 10/03/2013 1654    HgbA1C:  Lab Results  Component Value Date   HGBA1C 6.1 (H) 02/03/2020    Urine Drug Screen:      Component Value Date/Time   LABOPIA NONE DETECTED 02/03/2020 0736   COCAINSCRNUR NONE DETECTED 02/03/2020 0736   LABBENZ NONE DETECTED 02/03/2020 0736   AMPHETMU NONE DETECTED 02/03/2020 0736   THCU NONE DETECTED 02/03/2020 0736   LABBARB NONE DETECTED 02/03/2020 0736     Alcohol Level:  No results for input(s): ETH in the last 168 hours.  Miscellaneous labs:  EKG  EKG  IMAGING: CT Head Wo Contrast  Result Date: 02/02/2020 CLINICAL DATA:  Neuro deficit, acute, stroke suspected. Additional history provided: Slurred speech. EXAM: CT HEAD  WITHOUT CONTRAST TECHNIQUE: Contiguous axial images were obtained from the base of the skull through the vertex without intravenous contrast. COMPARISON:  Brain MRI 12/05/2019, head CT 12/03/2019 FINDINGS: Brain: Stable, mild generalized parenchymal atrophy. Redemonstrated chronic lacunar infarcts within the left corona radiata, basal ganglia and bilateral thalami. Stable background moderate multifocal T2/FLAIR hyperintensity within the cerebral white matter which is nonspecific, but consistent with chronic small vessel ischemic disease. There is no acute intracranial hemorrhage. No demarcated cortical infarct is identified. No extra-axial fluid collection. No evidence of intracranial mass. No midline shift. Vascular: No hyperdense vessel.  Atherosclerotic calcifications. Skull: Left parietal calvarial burr holes. No calvarial fracture. No suspicious osseous lesion. Sinuses/Orbits: Visualized orbits show no acute finding. Mild ethmoid sinus mucosal thickening. Moderate left maxillary sinus mucosal thickening with associated chronic reactive osteitis. No significant mastoid effusion. IMPRESSION: No CT evidence of acute intracranial abnormality. Stable mild generalized parenchymal atrophy and moderate chronic small vessel ischemic disease with multiple chronic small-vessel infarcts as detailed. Paranasal sinus mucosal thickening, most notably left maxillary. Electronically Signed   By: Jackey Loge DO   On: 02/02/2020 16:57   MR ANGIO HEAD WO CONTRAST  Result Date: 02/03/2020 CLINICAL DATA:  Slurred speech EXAM: MRI HEAD WITHOUT CONTRAST MRA HEAD WITHOUT CONTRAST TECHNIQUE: Multiplanar, multiecho pulse sequences of the brain and surrounding structures were obtained without intravenous contrast. Angiographic images of the head were obtained using MRA technique without contrast. COMPARISON:  Brain MRI 12/05/2019 FINDINGS: MRI HEAD FINDINGS Brain: No acute infarction, hemorrhage, hydrocephalus, extra-axial collection  or mass lesion. 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 Chronic small vessel ischemia with confluent periventricular FLAIR hyperintensity. No acute hemorrhage, hydrocephalus, or masslike finding. Remote cortically based hemorrhage along the high right frontal cortex, nonspecific in isolation. Vascular: Normal flow voids. Skull and upper cervical spine: Normal marrow signal. Sinuses/Orbits: Chronic left maxillary sinusitis with atelectasis and sclerotic wall thickening. Mucosal thickening is mild. MRA HEAD FINDINGS Atheromatous irregularity of the carotid siphons which is likely accentuated by artifact. No flow reducing stenosis suspected. Narrowing of the left A1 segment, likely accentuated by hypoplasia. No major branch occlusion or M1 flow limiting stenosis. There is a mild or moderate left M2 stenosis serving the upper division. Widespread atheromatous irregularity to MCA branches. Poor flow in the left vertebral artery compared to the right, but more robust at the level of the PICA origin. The basilar shows atheromatous irregularity without significant stenosis. Atheromatous irregularity to bilateral PCA branches. Negative for aneurysm. IMPRESSION: Brain MRI: 1. No acute finding. 2. Chronic small vessel ischemia with remote lacunar infarcts in the left more than right thalamus. Wallerian degeneration/atrophy is seen at the left cortical spinal radiations. Intracranial MRA: 1. No emergent finding. 2. Extensive intracranial atherosclerosis, as above Electronically Signed   By: Marnee Spring M.D.   On: 02/03/2020 14:48   MR BRAIN WO CONTRAST  Result Date: 02/03/2020 CLINICAL DATA:  Slurred speech EXAM: MRI HEAD WITHOUT CONTRAST MRA HEAD WITHOUT CONTRAST TECHNIQUE: Multiplanar, multiecho pulse sequences of the brain and surrounding structures were obtained without intravenous contrast. Angiographic images of the head were obtained using MRA technique  without contrast. COMPARISON:  Brain MRI 12/05/2019 FINDINGS: MRI HEAD FINDINGS Brain: No acute infarction, hemorrhage, hydrocephalus, extra-axial collection or mass lesion. Remote thalamic infarcts, multifocal on the left with wallerian changes  seen in the left brainstem. Chronic small vessel ischemia with confluent periventricular FLAIR hyperintensity. No acute hemorrhage, hydrocephalus, or masslike finding. Remote cortically based hemorrhage along the high right frontal cortex, nonspecific in isolation. Vascular: Normal flow voids. Skull and upper cervical spine: Normal marrow signal. Sinuses/Orbits: Chronic left maxillary sinusitis with atelectasis and sclerotic wall thickening. Mucosal thickening is mild. MRA HEAD FINDINGS Atheromatous irregularity of the carotid siphons which is likely accentuated by artifact. No flow reducing stenosis suspected. Narrowing of the left A1 segment, likely accentuated by hypoplasia. No major branch occlusion or M1 flow limiting stenosis. There is a mild or moderate left M2 stenosis serving the upper division. Widespread atheromatous irregularity to MCA branches. Poor flow in the left vertebral artery compared to the right, but more robust at the level of the PICA origin. The basilar shows atheromatous irregularity without significant stenosis. Atheromatous irregularity to bilateral PCA branches. Negative for aneurysm. IMPRESSION: Brain MRI: 1. No acute finding. 2. Chronic small vessel ischemia with remote lacunar infarcts in the left more than right thalamus. Wallerian degeneration/atrophy is seen at the left cortical spinal radiations. Intracranial MRA: 1. No emergent finding. 2. Extensive intracranial atherosclerosis, as above Electronically Signed   By: Marnee Spring M.D.   On: 02/03/2020 14:48   ECHOCARDIOGRAM LIMITED  Result Date: 02/03/2020    ECHOCARDIOGRAM LIMITED REPORT   Patient Name:   NORTH ESTERLINE Date of Exam: 02/03/2020 Medical Rec #:  [id number]            Height:       67.0 in Accession #:    [id number]          Weight:       138.0 lb Date of Birth:  [birth year]           BSA:          1.727 m Patient Age:    64 years            BP:           125/55 mmHg Patient Gender: M                   HR:           67 bpm. [facility] Procedure: Limited Echo Indications:    TIA 435.9 / G45.9  History:        Patient has prior history of Echocardiogram examinations, most                 recent 12/04/2019. CAD, TIA and COPD; Risk Factors:Dyslipidemia,                 Hypertension and Current Smoker.  Sonographer:    [name] RDCS (AE) Referring Phys: [id number] ASIA B ZIERLE-GHOSH IMPRESSIONS  1. Left ventricular ejection fraction, by estimation, is 55 to 60%. The left ventricle has normal function. Left ventricular diastolic parameters were normal.  2. Right ventricular systolic function is normal. The right ventricular size is normal. Tricuspid regurgitation signal is inadequate for assessing PA pressure.  3. The mitral valve is normal in structure. Trivial mitral valve regurgitation.  4. The aortic valve was not well visualized. Aortic valve regurgitation is not visualized. No aortic stenosis is present.  5. The inferior vena cava is normal in size with greater than 50% respiratory variability, suggesting right atrial pressure of 3 mmHg. FINDINGS  Left Ventricle: Left ventricular ejection fraction, by estimation, is 55 to 60%. The left ventricle has normal function.  Right Ventricle: The right ventricular size is normal. Right ventricular systolic function is normal. Tricuspid regurgitation signal is inadequate for assessing PA pressure. Pericardium: There is no evidence of pericardial effusion. Mitral Valve: The mitral valve is normal in structure. Trivial mitral valve regurgitation. Tricuspid Valve: The tricuspid valve is normal in structure. Tricuspid valve regurgitation is not demonstrated. Aortic Valve: The aortic valve was not well visualized. Aortic  valve regurgitation is not visualized. No aortic stenosis is present. Pulmonic Valve: The pulmonic valve was not well visualized. Pulmonic valve regurgitation is not visualized. Venous: The inferior vena cava is normal in size with greater than 50% respiratory variability, suggesting right atrial pressure of 3 mmHg. IAS/Shunts: The interatrial septum was not well visualized. LEFT VENTRICLE PLAX 2D LVIDd:         4.18 cm Diastology LVIDs:         3.56 cm LV e' lateral:   11.10 cm/s LV PW:         1.30 cm LV E/e' lateral: 5.8 LV IVS:        1.00 cm LV e' medial:    6.85 cm/s                        LV E/e' medial:  9.4  LEFT ATRIUM         Index LA diam:    3.30 cm 1.91 cm/m   AORTA Ao Root diam: 3.00 cm MITRAL VALVE MV Area (PHT): 2.55 cm MV Decel Time: 298 msec MV E velocity: 64.70 cm/s MV A velocity: 63.40 cm/s MV E/A ratio:  1.02 Epifanio Lesches MD Electronically signed by Epifanio Lesches MD Signature Date/Time: 02/03/2020/2:50:54 PM    Final      Assessment/Plan:64yr old with h/o TBI, chronic dysarthria and seizure disorder, not on ASD at this time. Apparently pt had a spell or worsening speech, now back to baseline. Pt not aware of this event. MRI neg for acute stroke, but did show old stroke and severe small vessel disease.   1. Transient speech change- TIA is high on ddx given amount of athero disease seen both intracranial/extracranial. Otherwise, given h/o seizures and TBI, seizures is also ddx here.  2. 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 This was noted in recent CUS done on 12/04/19: >70% bilat.  3. H/o TBI w/seizures- circumstances not clear, seems to be an old injury and pt states he was taken off ASDs b/c no further seizures over last 7yrs. 4. Baseline dysarthria- possibly d/t TBI? Pt states its been like this more than 5 yrs.   RECS: EEG Supportive care Neuro checks I spoke with attending  physician and we attempted to locate phone number for pts sister to fill in the HPI for [place]. Pt seems to be an ok historian, but we need further details of presenting events that pt is not aware of.   Attending neurologist's note to follow  Elta Angell Metzger-Cihelka, ARNP-C, ANVP-BC Pager: [phone]

## 2020-02-03 NOTE — Care Management Obs Status (Signed)
MEDICARE OBSERVATION STATUS NOTIFICATION   Patient Details  Name: MAKHARI DOVIDIO MRN: [id number] Date of Birth: 1956/01/28   Medicare Observation Status Notification Given:  Yes    Lawerance Sabal, RN 02/03/2020, 4:43 PM

## 2020-02-03 NOTE — Progress Notes (Signed)
*  PRELIMINARY RESULTS* Echocardiogram 2D Echocardiogram LIMITED has been performed.  [name] 02/03/2020, 9:11 AM

## 2020-02-03 NOTE — Progress Notes (Signed)
PROGRESS NOTE    [name]  DUK:[id number] DOB: June 15, 1956 DOA: 02/02/2020 PCP: Dettinger, Elige Radon, MD    Chief Complaint  Patient presents with  . Aphasia    Brief Narrative:  As per H&P written by Dr. Carren Rang on 02/02/20 [name]  is a 64 y.o. male, history of sleep apnea, seizures, TBI, hypertension, hyperlipidemia, GERD, depression, COPD, cerebral aneurysm, anxiety, and more presents to the ED with a chief complaint of slurred speech.  Of note patient speech is slurred at baseline and difficult to understand.  Sister is at bedside and she reports that patient had slurred speech that started 2 days ago.  It lasted for an hour at that time.  It came back today, and that is when she was more concerned so she called EMS.  The slurred speech is associated with right-sided weakness, and right upper extremity more than right lower extremity.  All of these symptoms resolved after an hour.  Patient reports no headache, no change in vision, no change in hearing.  He reports some burning substernal chest pain during the episode.  That has also resolved.  Patient declines palpitations, syncope, tremors, nausea, vomiting, loss of sensation.  Sister reports that in 2007 he had the same constellation of symptoms, but she does not remember the diagnosis or what was done.  Patient's major concern at the time of my exam is shoulder pain.  His left shoulder pain that started today.  It is achy, nonradiating.  It is worse with movement of his arm, rest makes it better.  Patient reports that the pain is 10 out of 10 however he appears quite comfortable. Patient smokes a pack and a half a day. Patient has a history of alcohol abuse and quit drinking years ago. Patient denies illicit drug use.  In the ED Temperature 98.5, heart rate 61, respiratory rate 27, blood pressure 135/55, satting 98% on room air White blood cell count 9.3, hemoglobin 12.9 Chemistry panel shows a potassium of  5.5 CT head shows no acute intracranial abnormality 500 mL bolus given in ED Telemetry neuro consult recommends MRI and TIA work up.  Assessment & Plan: 1-TIA (transient ischemic attack) -Complete TIA work-up -Currently asymptomatic with no acute distress -Continue aspirin and statins -Continue risk factor modifications.  2-essential hypertension -Continue metoprolol -Permissive hypertension in the setting of TIA work-up allowed -Heart healthy diet encouraged.  3-gastroesophageal for disease -Continue PPI.  4-hyperlipidemia -Continue statins -Follow lipid panel  5-anxiety/depression -Mood stable -Continue current anxiolytic and antidepressant therapy -No suicidal ideation or hallucinations.    DVT prophylaxis: heparin  Code Status: Full code. Family Communication: no family members at bedside.  Disposition:   Status is: Observation  Dispo: The patient is from: home              Anticipated d/c is to: home              Anticipated d/c date is: 1 day              Patient currently in no acute distress and hemodynamically stable; no new focal deficits from neurology standpoint.  No medically stable for discharge yet, as his work-up for TIA has not been completed.  Patient pending to be transferred to [facility] for MRI and based on results further discussion with neurology service.       Consultants:   Teleneurology: Recommendations given for admissions TIA work-up.   Procedures:   2D echo: Pending  MRI: Pending  US carotid Dopplers: Pending  CT head without contrast: Negative for acute intracranial normalities.   Antimicrobials:  None   Subjective: Afebrile, no chest pain, no nausea, no vomiting.  No new focal deficits appreciated.  In no acute distress.  Objective: Vitals:   02/03/20 0500 02/03/20 0600 02/03/20 0700 02/03/20 0715  BP: (!) 112/57 (!) 126/96 (!) 125/55   Pulse: (!) 56     Resp: 19 (!) 27 (!) 21 18  Temp:      TempSrc:       SpO2: 93% 95%    Weight:      Height:       No intake or output data in the 24 hours ending 02/03/20 1113 Filed Weights   02/02/20 1353  Weight: 62.6 kg    Examination:  General exam: Appears calm and comfortable, reports no new neurologic deficit currently.  Denies headache, blurred vision, no pronator drift.  Afebrile and denying chest pain or shortness of breath.  (Patient with underlying slow speech at baseline). Respiratory system: Clear to auscultation. Respiratory effort normal. Cardiovascular system: S1 & S2 heard, RRR. No JVD, rubs or gallops or clicks. No pedal edema. Gastrointestinal system: Abdomen is nondistended, soft and nontender. No organomegaly or masses felt. Normal bowel sounds heard. Central nervous system: Alert and oriented. No focal neurological deficits. Extremities: No cyanosis or clubbing. Skin: No rashes, no petechiae. Psychiatry: Mood & affect appropriate.     Data Reviewed: I have personally reviewed following labs and imaging studies  CBC: Recent Labs  Lab 02/02/20 1514 02/03/20 0734  WBC 9.3 7.9  NEUTROABS 6.0  --   HGB 12.9* 12.0*  HCT 40.4 36.7*  MCV 102.8* 100.0  PLT 189 223    Basic Metabolic Panel: Recent Labs  Lab 02/02/20 1514 02/02/20 2003 02/03/20 0734  NA 138  137  --  138  K 5.5*  5.4* 3.5 3.6  CL 106  106  --  105  CO2 19*  22  --  23  GLUCOSE 99  99  --  81  BUN 16  16  --  17  CREATININE 0.60*  0.62  --  0.67  CALCIUM 8.9  8.8*  --  8.9    GFR: Estimated Creatinine Clearance: 80.4 mL/min (by C-G formula based on SCr of 0.67 mg/dL).  Liver Function Tests: Recent Labs  Lab 02/02/20 1514 02/03/20 0734  AST 29 18  ALT 12 13  ALKPHOS 69 69  BILITOT 1.5* 1.1  PROT 6.9 6.8  ALBUMIN 3.8 3.6    CBG: No results for input(s): GLUCAP in the last 168 hours.   Recent Results (from the past 240 hour(s))  SARS Coronavirus 2 by RT PCR (hospital order, performed in [facility] hospital lab)  Nasopharyngeal Nasopharyngeal Swab     Status: None   Collection Time: 02/02/20  7:51 PM   Specimen: Nasopharyngeal Swab  Result Value Ref Range Status   SARS Coronavirus 2 NEGATIVE NEGATIVE Final    Comment: (NOTE) SARS-CoV-2 target nucleic acids are NOT DETECTED.  The SARS-CoV-2 RNA is generally detectable in upper and lower respiratory specimens during the acute phase of infection. The lowest concentration of SARS-CoV-2 viral copies this assay can detect is 250 copies / mL. A negative result does not preclude SARS-CoV-2 infection and should not be used as the sole basis for treatment or other patient management decisions.  A negative result may occur with improper specimen collection / handling, submission of specimen other than nasopharyngeal swab, presence  of viral mutation(s) within the areas targeted by this assay, and inadequate number of viral copies (<250 copies / mL). A negative result must be combined with clinical observations, patient history, and epidemiological information.  Fact Sheet for Patients:   BoilerBrush.com.cy  Fact Sheet for Healthcare Providers: https://pope.com/  This test is not yet approved or  cleared by the [place] FDA and has been authorized for detection and/or diagnosis of SARS-CoV-2 by FDA under an Emergency Use Authorization (EUA).  This EUA will remain in effect (meaning this test can be used) for the duration of the COVID-19 declaration under Section 564(b)(1) of the Act, 21 U.S.C. section 360bbb-3(b)(1), unless the authorization is terminated or revoked sooner.  Performed at [facility], [address], [place], [place] [address]      Radiology Studies: CT Head Wo Contrast  Result Date: 02/02/2020 CLINICAL DATA:  Neuro deficit, acute, stroke suspected. Additional history provided: Slurred speech. EXAM: CT HEAD WITHOUT CONTRAST TECHNIQUE: Contiguous axial images were obtained from the  base of the skull through the vertex without intravenous contrast. COMPARISON:  Brain MRI 12/05/2019, head CT 12/03/2019 FINDINGS: Brain: Stable, mild generalized parenchymal atrophy. Redemonstrated chronic lacunar infarcts within the left corona radiata, basal ganglia and bilateral thalami. Stable background moderate multifocal T2/FLAIR hyperintensity within the cerebral white matter which is nonspecific, but consistent with chronic small vessel ischemic disease. There is no acute intracranial hemorrhage. No demarcated cortical infarct is identified. No extra-axial fluid collection. No evidence of intracranial mass. No midline shift. Vascular: No hyperdense vessel.  Atherosclerotic calcifications. Skull: Left parietal calvarial burr holes. No calvarial fracture. No suspicious osseous lesion. Sinuses/Orbits: Visualized orbits show no acute finding. Mild ethmoid sinus mucosal thickening. Moderate left maxillary sinus mucosal thickening with associated chronic reactive osteitis. No significant mastoid effusion. IMPRESSION: No CT evidence of acute intracranial abnormality. Stable mild generalized parenchymal atrophy and moderate chronic small vessel ischemic disease with multiple chronic small-vessel infarcts as detailed. Paranasal sinus mucosal thickening, most notably left maxillary. Electronically Signed   By: Jackey Loge DO   On: 02/02/2020 16:57    Scheduled Meds: .  stroke: mapping our early stages of recovery book   Does not apply Once  . aspirin EC  81 mg Oral Q breakfast  . busPIRone  10 mg Oral TID  . folic acid  1 mg Oral Daily  . heparin  5,000 Units Subcutaneous Q8H  . melatonin  9 mg Oral QHS  . metoprolol succinate  25 mg Oral Daily  . mirabegron ER  25 mg Oral Daily  . niacin  1,000 mg Oral QHS  . pantoprazole  40 mg Oral Daily  . PARoxetine  20 mg Oral Daily  . pramipexole  1 mg Oral QHS  . risperiDONE  2 mg Oral QHS  . simvastatin  20 mg Oral QHS  . thiamine  100 mg Oral Daily  .  vitamin B-12  1,000 mcg Oral Daily   Continuous Infusions:   LOS: 0 days    Time spent: 30 minutes.    Vassie Loll, MD [facility]   To contact the attending provider between 7A-7P or the covering provider during after hours 7P-7A, please log into the web site [email or web address] and access using universal [place] password for that web site. If you do not have the password, please call the hospital operator.  02/03/2020, 11:13 AM

## 2020-02-03 NOTE — Progress Notes (Signed)
PT Cancellation Note  Patient Details Name: [name] MRN: [id number] DOB: 1955-12-12   Cancelled Treatment:    Reason Eval/Treat Not Completed: Patient at procedure or test/unavailable; Patient at [facility] for MRI, unable to perform PT evaluation at this time. Will continue to follow and will evaluate when able.      1:11 PM, 02/03/20 Wyman Songster PT, DPT Physical Therapist at [facility]

## 2020-02-03 NOTE — Progress Notes (Signed)
SLP Cancellation Note  Patient Details Name: [name] MRN: [id number] DOB: 03-Jul-1955   Cancelled treatment:        Attempted to see pt for cognitive linguistic evaluation.  Pt was off floor at time of attempt.  SLP will return as schedule permits.   Kerrie Pleasure , MA, CCC-SLP Acute Rehabilitation Services Office: [phone]; Pager (8/21): [phone] 02/03/2020, 1:42 PM

## 2020-02-03 NOTE — Evaluation (Signed)
Physical Therapy Evaluation Patient Details Name: [name] MRN: [id number] DOB: [birth year] Today's Date: 02/03/2020   History of Present Illness  [name]  is a 64 y.o. male, history of sleep apnea, seizures, TBI, hypertension, hyperlipidemia, GERD, depression, COPD, cerebral aneurysm, anxiety, and more presents to the ED with a chief complaint of slurred speech.  Of note patient speech is slurred at baseline and difficult to understand. PMH includes: Anxiety, Cerebral aneurysm, closed head injury,COPD (chronic obstructive pulmonary disease) (HCC), depression, GERD (gastroesophageal reflux disease),Hyperlipidemia, Hypertension, Seizures (HCC), Sleep apnea  Clinical Impression  Patient received in bed, agreeable to PT session. Reports he is feeling back to baseline. Strength is normal bilaterally. Patient requires min guard for transfers and ambulation. Has parkinson;s-like gait pattern with freezing type episodes, narrow stance and short shuffle steps. He reports this is normal for him. Ambulated 100 feet with min guard. He will continue to benefit from skilled PT while here to ensure safety with mobility for return home.       Follow Up Recommendations No PT follow up    Equipment Recommendations  None recommended by PT    Recommendations for Other Services       Precautions / Restrictions Precautions Precautions: Fall Restrictions Weight Bearing Restrictions: No      Mobility  Bed Mobility Overal bed mobility: Needs Assistance Bed Mobility: Supine to Sit;Sit to Supine     Supine to sit: Supervision Sit to supine: Supervision      Transfers Overall transfer level: Needs assistance Equipment used: None Transfers: Sit to/from Stand Sit to Stand: Min guard            Ambulation/Gait Ambulation/Gait assistance: Min guard Gait Distance (Feet): 100 Feet Assistive device: None Gait Pattern/deviations: Step-to pattern;Narrow base of support;Decreased  stride length Gait velocity: decreased   General Gait Details: Patient has parkinsons like gait with freezing episodes. He reports this is his normal gait.  Stairs            Wheelchair Mobility    Modified Rankin (Stroke Patients Only) Modified Rankin (Stroke Patients Only) Pre-Morbid Rankin Score: Moderate disability Modified Rankin: Moderate disability     Balance Overall balance assessment: Needs assistance Sitting-balance support: Feet supported Sitting balance-Leahy Scale: Fair Sitting balance - Comments: with reaching overhead, patient loses balance posteriorly   Standing balance support: During functional activity;No upper extremity supported Standing balance-Leahy Scale: Fair Standing balance comment: no overt LOB, but benefits from min guard                             Pertinent Vitals/Pain Pain Assessment: Faces Faces Pain Scale: Hurts little more Pain Location: L shoulder Pain Descriptors / Indicators: Discomfort Pain Intervention(s): Monitored during session    Home Living Family/patient expects to be discharged to:: Private residence Living Arrangements: Other relatives Available Help at Discharge: Family;Available PRN/intermittently Type of Home: Mobile home Home Access: Ramped entrance     Home Layout: One level Home Equipment: Walker - 2 wheels;Cane - single point;Shower seat      Prior Function Level of Independence: Needs assistance   Gait / Transfers Assistance Needed: household ambulator without AD  ADL's / Homemaking Assistance Needed: Pt reports HH aide 5 days/week for 8 hours/day who assists with ADLs. Sister/family assists when aide is not available ( per prior admission 1 mo ago)        Hand Dominance   Dominant Hand: Right    Extremity/Trunk Assessment  Upper Extremity Assessment Upper Extremity Assessment: Overall WFL for tasks assessed    Lower Extremity Assessment Lower Extremity Assessment: Overall WFL  for tasks assessed    Cervical / Trunk Assessment Cervical / Trunk Assessment: Normal  Communication   Communication: HOH  Cognition Arousal/Alertness: Awake/alert Behavior During Therapy: WFL for tasks assessed/performed Overall Cognitive Status: Within Functional Limits for tasks assessed                                        General Comments      Exercises     Assessment/Plan    PT Assessment Patient needs continued PT services  PT Problem List Decreased activity tolerance;Decreased mobility       PT Treatment Interventions Therapeutic activities;Gait training;Therapeutic exercise;Functional mobility training;Stair training;Balance training;Patient/family education    PT Goals (Current goals can be found in the Care Plan section)  Acute Rehab PT Goals Patient Stated Goal: to return home PT Goal Formulation: With patient Time For Goal Achievement: 02/07/20 Potential to Achieve Goals: Good    Frequency Min 2X/week   Barriers to discharge        Co-evaluation               AM-PAC PT "6 Clicks" Mobility  Outcome Measure Help needed turning from your back to your side while in a flat bed without using bedrails?: None Help needed moving from lying on your back to sitting on the side of a flat bed without using bedrails?: None Help needed moving to and from a bed to a chair (including a wheelchair)?: A Little Help needed standing up from a chair using your arms (e.g., wheelchair or bedside chair)?: A Little Help needed to walk in hospital room?: A Little Help needed climbing 3-5 steps with a railing? : A Little 6 Click Score: 20    End of Session Equipment Utilized During Treatment: Gait belt Activity Tolerance: Patient tolerated treatment well Patient left: in bed;with bed alarm set;with call bell/phone within reach Nurse Communication: Mobility status PT Visit Diagnosis: Other abnormalities of gait and mobility (R26.89)    Time:  3086-5784 PT Time Calculation (min) (ACUTE ONLY): 26 min   Charges:   PT Evaluation $PT Eval Moderate Complexity: 1 Mod PT Treatments $Gait Training: 8-22 mins        Rhiley Solem, PT, GCS 02/03/20,4:36 PM

## 2020-02-04 DIAGNOSIS — G4730 Sleep apnea, unspecified: Secondary | ICD-10-CM | POA: Diagnosis present

## 2020-02-04 DIAGNOSIS — R270 Ataxia, unspecified: Secondary | ICD-10-CM | POA: Diagnosis present

## 2020-02-04 DIAGNOSIS — R471 Dysarthria and anarthria: Secondary | ICD-10-CM | POA: Diagnosis present

## 2020-02-04 DIAGNOSIS — F419 Anxiety disorder, unspecified: Secondary | ICD-10-CM | POA: Diagnosis present

## 2020-02-04 DIAGNOSIS — G459 Transient cerebral ischemic attack, unspecified: Secondary | ICD-10-CM | POA: Diagnosis present

## 2020-02-04 DIAGNOSIS — F17200 Nicotine dependence, unspecified, uncomplicated: Secondary | ICD-10-CM | POA: Diagnosis present

## 2020-02-04 DIAGNOSIS — R569 Unspecified convulsions: Secondary | ICD-10-CM | POA: Diagnosis not present

## 2020-02-04 DIAGNOSIS — G40909 Epilepsy, unspecified, not intractable, without status epilepticus: Secondary | ICD-10-CM | POA: Diagnosis present

## 2020-02-04 DIAGNOSIS — F329 Major depressive disorder, single episode, unspecified: Secondary | ICD-10-CM | POA: Diagnosis present

## 2020-02-04 DIAGNOSIS — Z7982 Long term (current) use of aspirin: Secondary | ICD-10-CM | POA: Diagnosis not present

## 2020-02-04 DIAGNOSIS — E860 Dehydration: Secondary | ICD-10-CM | POA: Diagnosis present

## 2020-02-04 DIAGNOSIS — S069X0S Unspecified intracranial injury without loss of consciousness, sequela: Secondary | ICD-10-CM | POA: Diagnosis not present

## 2020-02-04 DIAGNOSIS — D649 Anemia, unspecified: Secondary | ICD-10-CM | POA: Diagnosis present

## 2020-02-04 DIAGNOSIS — E785 Hyperlipidemia, unspecified: Secondary | ICD-10-CM | POA: Diagnosis present

## 2020-02-04 DIAGNOSIS — E875 Hyperkalemia: Secondary | ICD-10-CM | POA: Diagnosis present

## 2020-02-04 DIAGNOSIS — X58XXXS Exposure to other specified factors, sequela: Secondary | ICD-10-CM | POA: Diagnosis present

## 2020-02-04 DIAGNOSIS — G8324 Monoplegia of upper limb affecting left nondominant side: Secondary | ICD-10-CM | POA: Diagnosis present

## 2020-02-04 DIAGNOSIS — Z79890 Hormone replacement therapy (postmenopausal): Secondary | ICD-10-CM | POA: Diagnosis not present

## 2020-02-04 DIAGNOSIS — Z79899 Other long term (current) drug therapy: Secondary | ICD-10-CM | POA: Diagnosis not present

## 2020-02-04 DIAGNOSIS — Z20822 Contact with and (suspected) exposure to covid-19: Secondary | ICD-10-CM | POA: Diagnosis present

## 2020-02-04 DIAGNOSIS — J449 Chronic obstructive pulmonary disease, unspecified: Secondary | ICD-10-CM | POA: Diagnosis present

## 2020-02-04 DIAGNOSIS — R4701 Aphasia: Secondary | ICD-10-CM | POA: Diagnosis present

## 2020-02-04 DIAGNOSIS — K219 Gastro-esophageal reflux disease without esophagitis: Secondary | ICD-10-CM | POA: Diagnosis present

## 2020-02-04 DIAGNOSIS — G8929 Other chronic pain: Secondary | ICD-10-CM | POA: Diagnosis present

## 2020-02-04 DIAGNOSIS — N3281 Overactive bladder: Secondary | ICD-10-CM | POA: Diagnosis present

## 2020-02-04 DIAGNOSIS — M19012 Primary osteoarthritis, left shoulder: Secondary | ICD-10-CM | POA: Diagnosis present

## 2020-02-04 DIAGNOSIS — I10 Essential (primary) hypertension: Secondary | ICD-10-CM | POA: Diagnosis present

## 2020-02-04 MED ORDER — GERHARDT'S BUTT CREAM
TOPICAL_CREAM | Freq: Three times a day (TID) | CUTANEOUS | Status: DC
  Filled 2020-02-04: qty 1

## 2020-02-04 NOTE — Consult Note (Signed)
WOC Nurse Consult Note: Reason for Consult: Moisture associated skin damage (MASD) at the apex of the gluteal cleft Wound type:Moisture Pressure Injury POA: N/A Measurement: 5cm x 2cm area of pronounced erythema with macerated periphery Wound bed:N/A Drainage (amount, consistency, odor) N/A Periwound: As noted above Dressing procedure/placement/frequency: I will provided the patient with a pressure redistribution chair cushion and a prescriptive topical preparation consisting of a moisture barrier, antifungal and a hydrocortisone (1:1:1, Gerhart's Butt Cream) to be applied three times daily in a thin layer to the affected area. Patient taught to lie on his side when in bed.  WOC nursing team will not follow, but will remain available to this patient, the nursing and medical teams.  Please re-consult if needed. Thanks, Ladona Mow, MSN, RN, GNP, [place]  Pager# [phone]

## 2020-02-04 NOTE — Evaluation (Signed)
Occupational Therapy Evaluation Patient Details Name: [name] MRN: [id number] DOB: 29-Mar-1956 Today's Date: 02/04/2020    History of Present Illness 64 yo male presenting by EMS for slurred speech. MRI negative. PMH including sleep apnea, seizures, TBI, HTN, hyperlipidemia, GERD, depression, COPD, cerebral aneurysm, and anxiety.   Clinical Impression   PTA, pt was living with his sister and was performing BADLs; reports he has a nursing aide who assists as needed for ADLs and IADLs. Pt currently requiring Supervision-Min guard A for safety during ADLs and mobility. Pt presenting with decreased balance and cognition; feel he is close to baseline cognition. Very motivated and willing to participate in therapy. Pt would benefit from further acute OT to facilitate safe dc and to address safe tub transfers with use of shower seat. Recommend dc to home once medically stable per physician.     Follow Up Recommendations  No OT follow up;Supervision/Assistance - 24 hour    Equipment Recommendations  Tub/shower seat    Recommendations for Other Services PT consult     Precautions / Restrictions Precautions Precautions: Fall      Mobility Bed Mobility Overal bed mobility: Needs Assistance Bed Mobility: Supine to Sit     Supine to sit: Supervision     General bed mobility comments: Supervision for safety. No physical A needed  Transfers Overall transfer level: Needs assistance Equipment used: Rolling walker (2 wheeled) Transfers: Sit to/from Stand Sit to Stand: Min guard         General transfer comment: Min guard A for safety    Balance Overall balance assessment: Needs assistance Sitting-balance support: No upper extremity supported;Feet supported Sitting balance-Leahy Scale: Good Sitting balance - Comments: Able to bend forward and adjust socks   Standing balance support: No upper extremity supported;During functional activity Standing balance-Leahy Scale:  Fair                             ADL either performed or assessed with clinical judgement   ADL Overall ADL's : Needs assistance/impaired Eating/Feeding: Set up;Sitting   Grooming: Oral care;Min guard;Standing Grooming Details (indicate cue type and reason): Increased time due to baseline hand deformity. Min Guard A for safety Upper Body Bathing: Supervision/ safety;Set up;Sitting   Lower Body Bathing: Min guard;Sit to/from stand   Upper Body Dressing : Supervision/safety;Set up;Sitting   Lower Body Dressing: Min guard;Sit to/from stand Lower Body Dressing Details (indicate cue type and reason): Pt adjusting socks without difficulty while sitting at EOB. Min Guard A for safety during sit<>stand. Pt requiring two attempts.  Toilet Transfer: Min guard;Ambulation;RW (simulated to recliner) Toilet Transfer Details (indicate cue type and reason): Min Guard A for safety. cues for hand placement with RW         Functional mobility during ADLs: Min guard;Rolling walker General ADL Comments: Pt presenting near baseline function. Requiring Supervision-min Guard A for safety with ADLs and mobility. 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  RUE Coordination: decreased fine motor   Lower Extremity  Assessment Lower Extremity Assessment: Overall WFL for tasks assessed   Cervical / Trunk Assessment Cervical / Trunk Assessment: Normal   Communication Communication Communication: HOH   Cognition Arousal/Alertness:  Awake/alert Behavior During Therapy: WFL for tasks assessed/performed Overall Cognitive Status: History of cognitive impairments - at baseline                                 General Comments: TBI at baseline. Feel pt is close to baseline cognition. Following commands and problem solving BADLs.   General Comments       Exercises     Shoulder Instructions      Home Living Family/patient expects to be discharged to:: Private residence Living Arrangements: Other relatives Available Help at Discharge: Family;Available PRN/intermittently Type of Home: Mobile home Home Access: Ramped entrance     Home Layout: One level     Bathroom Shower/Tub: Chief Strategy Officer: Standard Bathroom Accessibility: Yes   Home Equipment: Walker - 2 wheels;Cane - single point;Shower seat          Prior Functioning/Environment Level of Independence: Needs assistance  Gait / Transfers Assistance Needed: household ambulator without AD ADL's / Homemaking Assistance Needed: Pt reports HH aide 5 days/week for 8 hours/day who assists with ADLs. Sister/family assists when aide is not available Communication / Swallowing Assistance Needed: slurred speech at baseline          OT Problem List: Decreased strength;Decreased range of motion;Decreased activity tolerance;Impaired balance (sitting and/or standing);Decreased cognition;Decreased safety awareness;Decreased knowledge of use of DME or AE;Decreased knowledge of precautions;Pain      OT Treatment/Interventions: Self-care/ADL training;Therapeutic exercise;Energy conservation;DME and/or AE instruction;Therapeutic activities;Patient/family education    OT Goals(Current goals can be found in the care plan section) Acute Rehab OT Goals Patient Stated Goal: to return home OT Goal Formulation: With patient Time For Goal Achievement: 02/18/20 Potential to Achieve Goals: Good  OT Frequency: Min 2X/week   Barriers to D/C:             Co-evaluation              AM-PAC OT "6 Clicks" Daily Activity     Outcome Measure Help from another person eating meals?: None Help from another person taking care of personal grooming?: A Little Help from another person toileting, which includes using toliet, bedpan, or urinal?: A Little Help from another person bathing (including washing, rinsing, drying)?: A Little Help from another person to put on and taking off regular upper body clothing?: A Little Help from another person to put on and taking off regular lower body clothing?: A Little 6 Click Score: 19   End of Session Equipment Utilized During Treatment: Rolling walker;Gait belt Nurse Communication: Mobility status  Activity Tolerance: Patient tolerated treatment well Patient left: in chair;with call bell/phone within reach;with chair alarm set  OT Visit Diagnosis: Unsteadiness on feet (R26.81);Other abnormalities of gait and mobility (R26.89);Muscle weakness (generalized) (M62.81);Pain Pain - Right/Left: Left Pain - part of body: Shoulder                Time: 9379-0240 OT Time Calculation (min): 20 min Charges:  OT General Charges $OT Visit: 1 Visit OT Evaluation $OT Eval Moderate Complexity: 1 Mod  Aayan Haskew MSOT, OTR/L Acute Rehab Pager: [phone] Office: [phone]  Theodoro Grist [place] Jeff 02/04/2020, 10:18 AM

## 2020-02-04 NOTE — Plan of Care (Signed)
  Problem: Education: Goal: Knowledge of General Education information will improve Description Including pain rating scale, medication(s)/side effects and non-pharmacologic comfort measures Outcome: Progressing   

## 2020-02-04 NOTE — Plan of Care (Signed)
  Problem: Education: Goal: Knowledge of General Education information will improve Description: Including pain rating scale, medication(s)/side effects and non-pharmacologic comfort measures Outcome: Progressing   Problem: Health Behavior/Discharge Planning: Goal: Ability to manage health-related needs will improve Outcome: Progressing   Problem: Clinical Measurements: Goal: Ability to maintain clinical measurements within normal limits will improve Outcome: Progressing Goal: Will remain free from infection Outcome: Progressing Goal: Diagnostic test results will improve Outcome: Progressing Goal: Respiratory complications will improve Outcome: Progressing Goal: Cardiovascular complication will be avoided Outcome: Progressing   Problem: Activity: Goal: Risk for activity intolerance will decrease Outcome: Progressing   Problem: Nutrition: Goal: Adequate nutrition will be maintained Outcome: Progressing   Problem: Coping: Goal: Level of anxiety will decrease Outcome: Progressing   Problem: Elimination: Goal: Will not experience complications related to bowel motility Outcome: Progressing Goal: Will not experience complications related to urinary retention Outcome: Progressing   Problem: Pain Managment: Goal: General experience of comfort will improve Outcome: Progressing   Problem: Safety: Goal: Ability to remain free from injury will improve Outcome: Progressing   Problem: Skin Integrity: Goal: Risk for impaired skin integrity will decrease Outcome: Progressing   Problem: Education: Goal: Knowledge of disease or condition will improve Outcome: Progressing Goal: Knowledge of secondary prevention will improve Outcome: Progressing Goal: Knowledge of patient specific risk factors addressed and post discharge goals established will improve Outcome: Progressing Goal: Individualized Educational Video(s) Outcome: Progressing   Problem: Coping: Goal: Will verbalize  positive feelings about self Outcome: Progressing Goal: Will identify appropriate support needs Outcome: Progressing   

## 2020-02-04 NOTE — Progress Notes (Signed)
[facility]  PROGRESS NOTE  [name] NKN:[id number] DOB: 1956/03/23 DOA: 02/02/2020 PCP: [name], Patrick Radon, MD Admit date - 02/02/2020   Admitting Physician Lilyan Gilford, DO  Outpatient Primary MD for the patient is [name], Patrick Radon, MD  LOS - 0 Brief Narrative   [name] is a 64 y.o. year old male with medical history significant for HLD, anxiety, HTN who presented on 02/02/2020 with family concern for worsening dysarthria from baseline per chart review sister reported slurred speech that started 2 days prior to admission lasting an hour that reoccurred on day of admission which she called EMS.  .  In the ED patient was hemodynamically stable.  Lab work-up notable for potassium of 5.5, Covid test negative, UA unremarkable.  Telemetry neurology was consulted in the ED who recommended MRI.  Patient was transferred to [facility] on 8/21 for MRI.  Upon transfer to [facility] patient reported his speech was at baseline.    Stroke work-up consisted of: CT head with no acute intracranial abnormalities, and stable parenchymal atrophy and moderate chronic small vessel ischemic disease.  MRI brain showed no acute findings, demonstrated chronic small vessel ischemia with remote lacunar infarcts in left and right thalamus.  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    Subjective  Today he continues to complain of left shoulder pain.   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  On neurology assessment states dysarthric with significant ataxia that is reportedly from his residual baseline from TBI   Stroke or work-up essentially unremarkable for any acute findings.  Neurology recommends ruling out  seizure A1c 6.1, LDL at goal.  UDS unremarkable -Pending EEG, to ensure not an acute seizure --patient unsafe discharge at this point as we are unable to corroborate history of baseline dysarthria and weakness and to what degree this may have changed, additionally still awaiting EEG to rule out any seizure like activity that could be contributing -Continue home aspirin  Left shoulder pain, particular when raising above head likely due to chronic arthritis from degenerative changes (shoulder x-ray from 05/2017 shows chronic supraspinatus tendon disease). Denies any recent falls. Strength still intact  HLD, stable -Continue simvastatin 20 mg  Anxiety, stable-continue home BuSpar, Paxil, Risperdal  Hypertension, stable-continue home metoprolol  Overactive bladder -Continue home mirabegron  Hyperkalemia, mild.  Resolved.  Potassium currently within normal limits     Family Communication  :  Called sister [name] at [phone] on 8/21 and 8/22 and left a voicemail  Code Status :  FULL  Disposition Plan  :  Patient is from home. Anticipated d/c date: 1 to 2 days. Barriers to d/c or necessity for inpatient status: change to inpatient,patient unsafe discharge at this point as we are unable to corroborate history of baseline dysarthria and weakness and to what degree this may have changed---still unable to reach sister, additionally still awaiting EEG to rule out any seizure like activity that could be contributing Consults  :  Neurology  Procedures  :  EEG pending  DVT Prophylaxis  :  heparin  Lab Results  Component Value Date   PLT 223 02/03/2020    Diet :  Diet Order  Diet Heart Room service appropriate? Yes; Fluid consistency: Thin  Diet effective now                  Inpatient Medications Scheduled Meds: . aspirin EC  81 mg Oral Q breakfast  . busPIRone  10 mg Oral TID  . folic acid  1 mg Oral Daily  . heparin  5,000 Units Subcutaneous Q8H  .  melatonin  9 mg Oral QHS  . metoprolol succinate  25 mg Oral Daily  . mirabegron ER  25 mg Oral Daily  . niacin  1,000 mg Oral QHS  . pantoprazole  40 mg Oral Daily  . PARoxetine  20 mg Oral Daily  . pramipexole  1 mg Oral QHS  . risperiDONE  2 mg Oral QHS  . simvastatin  20 mg Oral QHS  . thiamine  100 mg Oral Daily  . vitamin B-12  1,000 mcg Oral Daily   Continuous Infusions: PRN Meds:.acetaminophen **OR** acetaminophen (TYLENOL) oral liquid 160 mg/5 mL **OR** acetaminophen, albuterol, ipratropium-albuterol, ondansetron (ZOFRAN) IV, polyethylene glycol  Antibiotics  :   Anti-infectives (From admission, onward)   None       Objective   Vitals:   02/03/20 2022 02/03/20 2317 02/04/20 0323 02/04/20 0752  BP: (!) 122/59 125/62 122/60 (!) 140/95  Pulse: 75 69 67 61  Resp: Temp: 98.2 F (36.8 C) 98.3 F (36.8 C) 98.5 F (36.9 C) 98 F (36.7 C)  TempSrc: Oral Oral Oral Oral  SpO2: 99% 98% 99% 96%  Weight:      Height:        SpO2: 96 %  Wt Readings from Last 3 Encounters:  02/02/20 62.6 kg  01/18/20 61.2 kg  12/03/19 59 kg     Intake/Output Summary (Last 24 hours) at 02/04/2020 1233 Last data filed at 02/04/2020 0900 Gross per 24 hour  Intake 1176 ml  Output 775 ml  Net 401 ml    Physical Exam:     Awake Alert, Oriented X 3, Normal affect Dysarthric, no facial droop, moving extremities on exam with no appreciable focal deficits [place].AT, Normal respiratory effort on room air, CTAB RRR,No Gallops,Rubs or new Murmurs,  +ve B.Sounds, Abd Soft, No tenderness, No rebound, guarding or rigidity. No Cyanosis, No new Rash or bruise     I have personally reviewed the following:   Data Reviewed:  CBC Recent Labs  Lab 02/02/20 1514 02/03/20 0734  WBC 9.3 7.9  HGB 12.9* 12.0*  HCT 40.4 36.7*  PLT 189 223  MCV 102.8* 100.0  MCH 32.8 32.7  MCHC 31.9 32.7  RDW 13.2 13.1  LYMPHSABS 2.3  --   MONOABS 0.8  --   EOSABS 0.1  --   BASOSABS 0.1   --     Chemistries  Recent Labs  Lab 02/02/20 1514 02/02/20 2003 02/03/20 0734  NA 138  137  --  138  K 5.5*  5.4* 3.5 3.6  CL 106  106  --  105  CO2 19*  22  --  23  GLUCOSE 99  99  --  81  BUN 16  16  --  17  CREATININE 0.60*  0.62  --  0.67  CALCIUM 8.9  8.8*  --  8.9  AST 29  --  18  ALT 12  --  13  ALKPHOS 69  --  69  BILITOT 1.5*  --  1.1   ------------------------------------------------------------------------------------------------------------------ Recent Labs  02/03/20 0734  CHOL 130  HDL 57  LDLCALC 64  TRIG 44  CHOLHDL 2.3    Lab Results  Component Value Date   HGBA1C 6.1 (H) 02/03/2020   ------------------------------------------------------------------------------------------------------------------ No results for input(s): TSH, T4TOTAL, T3FREE, THYROIDAB in the last 72 hours.  Invalid input(s): FREET3 ------------------------------------------------------------------------------------------------------------------ No results for input(s): VITAMINB12, FOLATE, FERRITIN, TIBC, IRON, RETICCTPCT in the last 72 hours.  Coagulation profile No results for input(s): INR, PROTIME in the last 168 hours.  No results for input(s): DDIMER in the last 72 hours.  Cardiac Enzymes No results for input(s): CKMB, TROPONINI, MYOGLOBIN in the last 168 hours.  Invalid input(s): CK ------------------------------------------------------------------------------------------------------------------ No results found for: BNP  Micro Results Recent Results (from the past 240 hour(s))  SARS Coronavirus 2 by RT PCR (hospital order, performed in [facility] hospital lab) Nasopharyngeal Nasopharyngeal Swab     Status: None   Collection Time: 02/02/20  7:51 PM   Specimen: Nasopharyngeal Swab  Result Value Ref Range Status   SARS Coronavirus 2 NEGATIVE NEGATIVE Final    Comment: (NOTE) SARS-CoV-2 target nucleic acids are NOT DETECTED.  The SARS-CoV-2 RNA is  generally detectable in upper and lower respiratory specimens during the acute phase of infection. The lowest concentration of SARS-CoV-2 viral copies this assay can detect is 250 copies / mL. A negative result does not preclude SARS-CoV-2 infection and should not be used as the sole basis for treatment or other patient management decisions.  A negative result may occur with improper specimen collection / handling, submission of specimen other than nasopharyngeal swab, presence of viral mutation(s) within the areas targeted by this assay, and inadequate number of viral copies (<250 copies / mL). A negative result must be combined with clinical observations, patient history, and epidemiological information.  Fact Sheet for Patients:   BoilerBrush.com.cy  Fact Sheet for Healthcare Providers: https://pope.com/  This test is not yet approved or  cleared by the [place] FDA and has been authorized for detection and/or diagnosis of SARS-CoV-2 by FDA under an Emergency Use Authorization (EUA).  This EUA will remain in effect (meaning this test can be used) for the duration of the COVID-19 declaration under Section 564(b)(1) of the Act, 21 U.S.C. section 360bbb-3(b)(1), unless the authorization is terminated or revoked sooner.  Performed at [facility], [address] [place], [place] [address]     Radiology Reports CT Head Wo Contrast  Result Date: 02/02/2020 CLINICAL DATA:  Neuro deficit, acute, stroke suspected. Additional history provided: Slurred speech. EXAM: CT HEAD WITHOUT CONTRAST TECHNIQUE: Contiguous axial images were obtained from the base of the skull through the vertex without intravenous contrast. COMPARISON:  Brain MRI 12/05/2019, head CT 12/03/2019 FINDINGS: Brain: Stable, mild generalized parenchymal atrophy. Redemonstrated chronic lacunar infarcts within the left corona radiata, basal ganglia and bilateral thalami.  Stable background moderate multifocal T2/FLAIR hyperintensity within the cerebral white matter which is nonspecific, but consistent with chronic small vessel ischemic disease. There is no acute intracranial hemorrhage. No demarcated cortical infarct is identified. No extra-axial fluid collection. No evidence of intracranial mass. No midline shift. Vascular: No hyperdense vessel.  Atherosclerotic calcifications. Skull: Left parietal calvarial burr holes. No calvarial fracture. No suspicious osseous lesion. Sinuses/Orbits: Visualized orbits show no acute finding. Mild ethmoid sinus mucosal thickening. Moderate left maxillary sinus mucosal thickening with associated chronic reactive osteitis. No significant mastoid effusion. IMPRESSION: No CT evidence of acute intracranial abnormality. Stable mild generalized parenchymal atrophy and moderate chronic small vessel ischemic disease with multiple chronic small-vessel  infarcts as detailed. Paranasal sinus mucosal thickening, most notably left maxillary. Electronically Signed   By: Jackey Loge DO   On: 02/02/2020 16:57   MR ANGIO HEAD WO CONTRAST  Result Date: 02/03/2020 CLINICAL DATA:  Slurred speech EXAM: MRI HEAD WITHOUT CONTRAST MRA HEAD WITHOUT CONTRAST TECHNIQUE: Multiplanar, multiecho pulse sequences of the brain and surrounding structures were obtained without intravenous contrast. Angiographic images of the head were obtained using MRA technique without contrast. COMPARISON:  Brain MRI 12/05/2019 FINDINGS: MRI HEAD FINDINGS Brain: No acute infarction, hemorrhage, hydrocephalus, extra-axial collection or mass lesion. 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 Chronic small vessel ischemia with confluent periventricular FLAIR hyperintensity. No acute hemorrhage, hydrocephalus, or masslike finding. Remote cortically based hemorrhage along the high right frontal cortex, nonspecific in isolation. Vascular:  Normal flow voids. Skull and upper cervical spine: Normal marrow signal. Sinuses/Orbits: Chronic left maxillary sinusitis with atelectasis and sclerotic wall thickening. Mucosal thickening is mild. MRA HEAD FINDINGS Atheromatous irregularity of the carotid siphons which is likely accentuated by artifact. No flow reducing stenosis suspected. Narrowing of the left A1 segment, likely accentuated by hypoplasia. No major branch occlusion or M1 flow limiting stenosis. There is a mild or moderate left M2 stenosis serving the upper division. Widespread atheromatous irregularity to MCA branches. Poor flow in the left vertebral artery compared to the right, but more robust at the level of the PICA origin. The basilar shows atheromatous irregularity without significant stenosis. Atheromatous irregularity to bilateral PCA branches. Negative for aneurysm. IMPRESSION: Brain MRI: 1. No acute finding. 2. Chronic small vessel ischemia with remote lacunar infarcts in the left more than right thalamus. Wallerian degeneration/atrophy is seen at the left cortical spinal radiations. Intracranial MRA: 1. No emergent finding. 2. Extensive intracranial atherosclerosis, as above Electronically Signed   By: Marnee Spring M.D.   On: 02/03/2020 14:48   MR BRAIN WO CONTRAST  Result Date: 02/03/2020 CLINICAL DATA:  Slurred speech EXAM: MRI HEAD WITHOUT CONTRAST MRA HEAD WITHOUT CONTRAST TECHNIQUE: Multiplanar, multiecho pulse sequences of the brain and surrounding structures were obtained without intravenous contrast. Angiographic images of the head were obtained using MRA technique without contrast. COMPARISON:  Brain MRI 12/05/2019 FINDINGS: MRI HEAD FINDINGS Brain: No acute infarction, hemorrhage, hydrocephalus, extra-axial collection or mass lesion. 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 Chronic small vessel ischemia with confluent periventricular FLAIR hyperintensity. No  acute hemorrhage, hydrocephalus, or masslike finding. Remote cortically based hemorrhage along the high right frontal cortex, nonspecific in isolation. Vascular: Normal flow voids. Skull and upper cervical spine: Normal marrow signal. Sinuses/Orbits: Chronic left maxillary sinusitis with atelectasis and sclerotic wall thickening. Mucosal thickening is mild. MRA HEAD FINDINGS Atheromatous irregularity of the carotid siphons which is likely accentuated by artifact. No flow reducing stenosis suspected. Narrowing of the left A1 segment, likely accentuated by hypoplasia. No major branch occlusion or M1 flow limiting stenosis. There is a mild or moderate left M2 stenosis serving the upper division. Widespread atheromatous irregularity to MCA branches. Poor flow in the left vertebral artery compared to the right, but more robust at the level of the PICA origin. The basilar shows atheromatous irregularity without significant stenosis. Atheromatous irregularity to bilateral PCA branches. Negative for aneurysm. IMPRESSION: Brain MRI: 1. No acute finding. 2. Chronic small vessel ischemia with remote lacunar infarcts in the left more than right thalamus. Wallerian degeneration/atrophy is seen at the left cortical spinal radiations. Intracranial MRA:  1. No emergent finding. 2. Extensive intracranial atherosclerosis, as above Electronically Signed   By: Marnee Spring M.D.   On: 02/03/2020 14:48   ECHOCARDIOGRAM LIMITED  Result Date: 02/03/2020    ECHOCARDIOGRAM LIMITED REPORT   Patient Name:   TOVIA KISNER Date of Exam: 02/03/2020 Medical Rec #:  [id number]           Height:       67.0 in Accession #:    [id number]          Weight:       138.0 lb Date of Birth:  1955/10/04           BSA:          1.727 m Patient Age:    64 years            BP:           125/55 mmHg Patient Gender: M                   HR:           67 bpm. [facility] Procedure: Limited Echo Indications:    TIA 435.9 / G45.9  History:         Patient has prior history of Echocardiogram examinations, most                 recent 12/04/2019. CAD, TIA and COPD; Risk Factors:Dyslipidemia,                 Hypertension and Current Smoker.  Sonographer:    [name] RDCS (AE) Referring Phys: [id number] ASIA B ZIERLE-GHOSH IMPRESSIONS  1. Left ventricular ejection fraction, by estimation, is 55 to 60%. The left ventricle has normal function. Left ventricular diastolic parameters were normal.  2. Right ventricular systolic function is normal. The right ventricular size is normal. Tricuspid regurgitation signal is inadequate for assessing PA pressure.  3. The mitral valve is normal in structure. Trivial mitral valve regurgitation.  4. The aortic valve was not well visualized. Aortic valve regurgitation is not visualized. No aortic stenosis is present.  5. The inferior vena cava is normal in size with greater than 50% respiratory variability, suggesting right atrial pressure of 3 mmHg. FINDINGS  Left Ventricle: Left ventricular ejection fraction, by estimation, is 55 to 60%. The left ventricle has normal function. Right Ventricle: The right ventricular size is normal. Right ventricular systolic function is normal. Tricuspid regurgitation signal is inadequate for assessing PA pressure. Pericardium: There is no evidence of pericardial effusion. Mitral Valve: The mitral valve is normal in structure. Trivial mitral valve regurgitation. Tricuspid Valve: The tricuspid valve is normal in structure. Tricuspid valve regurgitation is not demonstrated. Aortic Valve: The aortic valve was not well visualized. Aortic valve regurgitation is not visualized. No aortic stenosis is present. Pulmonic Valve: The pulmonic valve was not well visualized. Pulmonic valve regurgitation is not visualized. Venous: The inferior vena cava is normal in size with greater than 50% respiratory variability, suggesting right atrial pressure of 3 mmHg. IAS/Shunts: The interatrial septum was not  well visualized. LEFT VENTRICLE PLAX 2D LVIDd:         4.18 cm Diastology LVIDs:         3.56 cm LV e' lateral:   11.10 cm/s LV PW:         1.30 cm LV E/e' lateral: 5.8 LV IVS:        1.00 cm LV e' medial:  6.85 cm/s                        LV E/e' medial:  9.4  LEFT ATRIUM         Index LA diam:    3.30 cm 1.91 cm/m   AORTA Ao Root diam: 3.00 cm MITRAL VALVE MV Area (PHT): 2.55 cm MV Decel Time: 298 msec MV E velocity: 64.70 cm/s MV A velocity: 63.40 cm/s MV E/A ratio:  1.02 Epifanio Lescheshristopher Schumann MD Electronically signed by Epifanio Lescheshristopher Schumann MD Signature Date/Time: 02/03/2020/2:50:54 PM    Final      Time Spent in minutes  30     Laverna PeaceShayla D Shamaine Mulkern M.D on 02/04/2020 at 12:33 PM  To page go to [email or web address] - password [email or web address]

## 2020-02-04 NOTE — Evaluation (Signed)
Speech Language Pathology Evaluation Patient Details Name: [name] MRN: [id number] DOB: April 24, 1956 Today's Date: 02/04/2020 Time: 2800-3491 SLP Time Calculation (min) (ACUTE ONLY): 17 min  Problem List:  Patient Active Problem List   Diagnosis Date Noted  . TIA (transient ischemic attack) 02/02/2020  . Weakness 12/04/2019  . Ataxia 12/03/2019  . Hypokalemia 12/03/2019  . Macrocytic anemia 12/03/2019  . Cellulitis of left thumb 11/29/2019  . Mild tetrahydrocannabinol (THC) abuse 08/09/2019  . Controlled substance agreement signed 03/14/2018  . DDD (degenerative disc disease), cervical 08/24/2017  . Arthritis 07/29/2017  . Memory loss or impairment 07/08/2016  . Loss of weight 07/07/2013  . Frequent urination 11/04/2012  . Seizure disorder (HCC) 11/04/2012  . RLS (restless legs syndrome) 11/04/2012  . HLD (hyperlipidemia) 11/04/2012  . CAD (coronary artery disease) 11/04/2012  . HEAD TRAUMA, CLOSED 04/16/2009  . Hyperlipidemia 06/05/2008  . Generalized anxiety disorder 06/05/2008  . History of alcohol abuse 06/05/2008  . TOBACCO ABUSE 06/05/2008  . Depression, recurrent (HCC) 06/05/2008  . OBSTRUCTIVE SLEEP APNEA 06/05/2008  . Essential hypertension 06/05/2008  . CEREBRAL ANEURYSM 06/05/2008  . COPD (chronic obstructive pulmonary disease) (HCC) 06/05/2008  . GERD 06/05/2008  . HEADACHE, CHRONIC 06/05/2008   Past Medical History:  Past Medical History:  Diagnosis Date  . Anxiety   . Cerebral aneurysm   . Closed head injury   . COPD (chronic obstructive pulmonary disease) (HCC)   . Depression   . GERD (gastroesophageal reflux disease)   . Hyperlipidemia   . Hypertension   . Seizures (HCC)   . Sleep apnea    Past Surgical History: History reviewed. No pertinent surgical history. HPI:  Pt is a 64 y.o. male, history of sleep apnea, seizures, TBI, hypertension, hyperlipidemia, GERD, depression, COPD, cerebral aneurysm, anxiety, who presented to the ED with a  chief complaint of slurred speech. MRI negative for acute changes.   Assessment / Plan / Recommendation Clinical Impression  Pt participated in speech/language evaluation. He reported that his speech and cognition are currently at baseline. He indicated that he had not received SLP services for dysarthria, but is not interested in improving his speech since it does not bother him. He presented with moderate dysarthria characterized by imprecise articulation and a reduced vocal intensity which together impacted speech intelligibility at the phrase level. He presented with cognitive-linguistic impairments in the areas of memory and complex problem solving. His language skills are currently within normal limits; however, repetition was often needed due to impairments in memory. Overall, pt's performance appears similar to when he was last seen by speech pathology in June and the pt has denied any acute changes. Further acute skilled SLP services are not clinically indicated at this time.     SLP Assessment  SLP Recommendation/Assessment: Patient does not need any further Speech Lanaguage Pathology Services SLP Visit Diagnosis: Cognitive communication deficit (R41.841);Dysarthria and anarthria (R47.1)    Follow Up Recommendations  None    Frequency and Duration           SLP Evaluation Cognition  Overall Cognitive Status: Within Functional Limits for tasks assessed Arousal/Alertness: Awake/alert Orientation Level: Oriented to person;Oriented to place;Disoriented to situation;Disoriented to time (Year: 2001) Attention: Focused;Sustained Focused Attention: Appears intact Sustained Attention: Appears intact Memory: Impaired Memory Impairment: Retrieval deficit;Decreased recall of new information;Storage deficit (Immediate: 3/3; delayed: 0/3; with cues: 3/3) Awareness: Appears intact Problem Solving: Impaired Problem Solving Impairment: Verbal complex       Comprehension  Auditory  Comprehension Overall Auditory Comprehension:  Appears within functional limits for tasks assessed Yes/No Questions: Within Functional Limits;Impaired Basic Biographical Questions:  (5/5) Complex Questions:  (4/5) Paragraph Comprehension (via yes/no questions):  (2/3) Conversation: Complex    Expression Expression Primary Mode of Expression: Verbal Verbal Expression Overall Verbal Expression: Appears within functional limits for tasks assessed Initiation: No impairment Automatic Speech: Counting;Day of week;Month of year (WNL) Level of Generative/Spontaneous Verbalization: Conversation Repetition: No impairment (5/5) Naming: No impairment   Oral / Motor  Oral Motor/Sensory Function Overall Oral Motor/Sensory Function: Within functional limits Motor Speech Overall Motor Speech: Impaired at baseline Respiration: Within functional limits Phonation: Low vocal intensity Resonance: Within functional limits Articulation: Impaired Level of Impairment: Sentence Intelligibility: Intelligibility reduced Word: 75-100% accurate Phrase: 50-74% accurate Sentence: 25-49% accurate Conversation: 25-49% accurate Motor Planning: Witnin functional limits Motor Speech Errors: Aware;Consistent   Cortlyn Cannell I. Vear Clock, MS, CCC-SLP Acute Rehabilitation Services Office number [phone] Pager [phone]                    Scheryl Marten 02/04/2020, 2:15 PM

## 2020-02-04 NOTE — Progress Notes (Signed)
SLP Cancellation Note  Patient Details Name: [name] MRN: [id number] DOB: 1955/08/14   Cancelled treatment:       Reason Eval/Treat Not Completed: Patient at procedure or test/unavailable (Pt currently working with OT. SLP will follow up. )  Jarrah Seher I. Vear Clock, MS, CCC-SLP Acute Rehabilitation Services Office number [phone] Pager [phone]  Scheryl Marten 02/04/2020, 9:49 AM

## 2020-02-05 ENCOUNTER — Inpatient Hospital Stay (HOSPITAL_COMMUNITY): Payer: Medicare Other

## 2020-02-05 DIAGNOSIS — G459 Transient cerebral ischemic attack, unspecified: Secondary | ICD-10-CM

## 2020-02-05 DIAGNOSIS — R569 Unspecified convulsions: Secondary | ICD-10-CM

## 2020-02-05 MED ORDER — ASPIRIN 81 MG PO CHEW
81.0000 mg | CHEWABLE_TABLET | Freq: Once | ORAL | Status: AC
  Administered 2020-02-05: 81 mg via ORAL
  Filled 2020-02-05: qty 1

## 2020-02-05 MED ORDER — ASPIRIN 81 MG PO CHEW
162.0000 mg | CHEWABLE_TABLET | Freq: Once | ORAL | Status: AC
  Administered 2020-02-05: 162 mg via ORAL
  Filled 2020-02-05: qty 2

## 2020-02-05 MED ORDER — ASPIRIN EC 325 MG PO TBEC: 325.0000 mg | DELAYED_RELEASE_TABLET | Freq: Every day | ORAL | 1 refills | Status: AC

## 2020-02-05 NOTE — Discharge Summary (Signed)
[name] ACZ:[id number] DOB: 1955/09/01 DOA: 02/02/2020  PCP: Nils Pyle, MD  Admit date: 02/02/2020 Discharge date: 02/05/2020  Admitted From: home Disposition:  home  Recommendations for Outpatient Follow-up:  1. Follow up with PCP in 1-2 weeks 2. New medications: aspirin 324 mg  Home Health:PT, RN  Equipment/Devices:none  Discharge Condition:Stable  CODE STATUS:FULL  Diet recommendation: heart healthy  Brief/Interim Summary: History of present illness:  [name] is a 64 y.o. year old male with medical history significant for HLD, anxiety, HTN who presented on 02/02/2020 with family concern for worsening dysarthria from baseline per chart review sister reported slurred speech that started 2 days prior to admission lasting an hour that reoccurred on day of admission which she called EMS.  .  In the ED patient was hemodynamically stable.  Lab work-up notable for potassium of 5.5, Covid test negative, UA unremarkable.  Telemetry neurology was consulted in the ED who recommended MRI.  Patient was transferred to [facility] on 8/21 for MRI.  Upon transfer to [facility] patient reported his speech was at baseline.    Stroke work-up consisted of: CT head with no acute intracranial abnormalities, and stable parenchymal atrophy and moderate chronic small vessel ischemic disease.  MRI brain showed no acute findings, demonstrated chronic small vessel ischemia with remote lacunar infarcts in left and right thalamus.  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 Remaining hospital course addressed in problem based format below:   Hospital Course:   Reported acute worsening of baseline dysarthria and worsening weakness, suspect TBI, resolved. No repeat episodes during hospitalization. MRI was non acute.  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  On neurology assessment states dysarthric with significant ataxia that is reportedly from his residual baseline from TBI   Stroke or work-up essentially unremarkable for any acute findings.  With Neurology assistance seizure thought less likely with no evidence of seizures on EEG. e A1c 6.1, LDL at goal.  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 Ongoing since TBI. 4/5 strength in upper left extremity. No Acute findings on imaging.  -outpatient neurology referral  Chronic Left shoulder pain, particular when raising above head likely due to chronic arthritis from degenerative changes (shoulder x-ray from 05/2017 shows chronic supraspinatus tendon disease). Denies any recent falls. Strength still intact --advised PRN Tylenol  HLD, stable -Continue simvastatin 20 mg  Anxiety, stable -continue home BuSpar, Paxil, Risperdal  Hypertension, stable -continue home metoprolol  Overactive bladder, stable -Continue home mirabegron  Hyperkalemia, mild.  Resolved.  Potassium currently within normal limits    Consultations:  Neurology  Procedures/Studies: TTE- 8/21 1. Left ventricular ejection fraction, by estimation, is 55 to 60%. The  left ventricle has normal function. Left ventricular diastolic parameters  were normal.  2. Right ventricular systolic function is normal. The right ventricular  size is normal. Tricuspid regurgitation signal is inadequate for assessing  PA pressure.  3. The mitral valve is normal in structure. Trivial mitral valve  regurgitation.  4. The aortic valve was not well visualized. Aortic valve regurgitation  is not visualized. No aortic stenosis is  present.  5. The inferior vena cava is normal in size with greater than 50%  respiratory variability, suggesting right atrial pressure of 3 mmHg  EEG 8/23  ABNORMALITY -Intermittent delta slowing, left more than right temporal region  IMPRESSION: This study is suggestive of nonspecific cortical dysfunction in left more than right temporal region. No seizures or epileptiform discharges were seen throughout the recording.  Subjective: He has no complaints. Eating well.  Discharge Exam: Vitals:   02/05/20 0323 02/05/20 1018  BP: (!) 105/57 112/64  Pulse: 66 68  Resp: 18 18  Temp: 98.3 F (36.8 C) 97.7 F (36.5 C)  SpO2: 98% 95%   Vitals:   02/04/20 1933 02/04/20 2314 02/05/20 0323 02/05/20 1018  BP: (!) 109/52 (!) 113/59 (!) 105/57 112/64  Pulse: 61 65 66 68  Resp: 18 18 18 18   Temp: 97.9 F (36.6 C) 98 F (36.7 C) 98.3 F (36.8 C) 97.7 F (36.5 C)  TempSrc: Oral Oral Oral Oral  SpO2: 94% 96% 98% 95%  Weight:      Height:         Awake Alert, Oriented X 3, Normal affect Dysarthric (at patient's baseline), no facial droop, moving extremities on exam with no appreciable focal deficits [place].AT, Normal respiratory effort on room air, CTAB RRR,No Gallops,Rubs or new Murmurs,  +ve B.Sounds, Abd Soft, No tenderness, No rebound, guarding or rigidity. No Cyanosis, No new Rash or bruise    Discharge Diagnoses:  Active Problems:   TIA (transient ischemic attack)   Dysarthria    Discharge Instructions  Discharge Instructions    Diet - low sodium heart healthy   Complete by: As directed    Increase activity slowly   Complete by: As directed    No wound care   Complete by: As directed      Allergies as of 02/05/2020      Reactions   Clonidine Rash      Medication List    STOP taking these medications   acetaminophen 325 MG tablet Commonly known as: TYLENOL   multivitamin with minerals Tabs tablet     TAKE these medications   albuterol 108 (90 Base)  MCG/ACT inhaler Commonly known as: VENTOLIN HFA 2 PUFFS EVERY 6 HOURS AS NEEDED FOR WHEEZING OR SHORTNESS OF BREATH   aspirin EC 325 MG tablet Take 1 tablet (325 mg total) by mouth daily. What changed:   medication strength  how much to take  when to take this   busPIRone 10 MG tablet Commonly known as: BUSPAR Take 10 mg by mouth 3 (three) times daily.   esomeprazole 40 MG capsule Commonly known as: NEXIUM TAKE (1) CAPSULE DAILY What changed: See the new instructions.   folic acid 1 MG tablet Commonly known as: FOLVITE Take 1 tablet (1 mg total) by mouth daily.   ipratropium-albuterol 0.5-2.5 (3) MG/3ML Soln Commonly known as: DUONEB Take 3 mLs by nebulization every 4 (four) hours as needed. What changed: when to take this   KP VITAMIN B-12 PO Take 1 tablet by mouth daily.   Melatonin 10 MG Tabs Take 10 mg by mouth at bedtime. What changed: how much to take   metoprolol succinate 25 MG 24 hr tablet Commonly known as: TOPROL-XL Take 1 tablet (25 mg total) by mouth daily.   mirabegron ER 25 MG Tb24 tablet Commonly known as: Myrbetriq Take 1 tablet (25 mg total) by mouth daily.   mupirocin ointment 2 % Commonly known as: BACTROBAN PLACE 1 APPLICATION INTO THE  NOSE 2 TIMES A DAY What changed:   how much to take  how to take this  when to take this  additional instructions   niacin 1000 MG CR tablet Commonly known as: NIASPAN TAKE 1 TABLET AT BEDTIME   PARoxetine 20 MG tablet Commonly known as: Paxil Take 1 tablet (20 mg total) by mouth daily.   potassium chloride 10 MEQ tablet Commonly known as: KLOR-CON Take 1 tablet (10 mEq total) by mouth daily.   pramipexole 0.5 MG tablet Commonly known as: MIRAPEX Take 2 tablets (1 mg total) by mouth at bedtime.   risperiDONE 2 MG tablet Commonly known as: RISPERDAL Take 1 tablet (2 mg total) by mouth at bedtime.   simvastatin 20 MG tablet Commonly known as: ZOCOR Take 1 tablet (20 mg total) by mouth  at bedtime. What changed: how much to take   thiamine 100 MG tablet Take 1 tablet (100 mg total) by mouth daily.       Follow-up Information    [facility]. Schedule an appointment as soon as possible for a visit in 1 week(s).   Contact information: [address] [place], [address] [place] [address] [phone]             Allergies  Allergen Reactions  . Clonidine Rash        The results of significant diagnostics from this hospitalization (including imaging, microbiology, ancillary and laboratory) are listed below for reference.     Microbiology: Recent Results (from the past 240 hour(s))  SARS Coronavirus 2 by RT PCR (hospital order, performed in [facility] hospital lab) Nasopharyngeal Nasopharyngeal Swab     Status: None   Collection Time: 02/02/20  7:51 PM   Specimen: Nasopharyngeal Swab  Result Value Ref Range Status   SARS Coronavirus 2 NEGATIVE NEGATIVE Final    Comment: (NOTE) SARS-CoV-2 target nucleic acids are NOT DETECTED.  The SARS-CoV-2 RNA is generally detectable in upper and lower respiratory specimens during the acute phase of infection. The lowest concentration of SARS-CoV-2 viral copies this assay can detect is 250 copies / mL. A negative result does not preclude SARS-CoV-2 infection and should not be used as the sole basis for treatment or other patient management decisions.  A negative result may occur with improper specimen collection / handling, submission of specimen other than nasopharyngeal swab, presence of viral mutation(s) within the areas targeted by this assay, and inadequate number of viral copies (<250 copies / mL). A negative result must be combined with clinical observations, patient history, and epidemiological information.  Fact Sheet for Patients:   BoilerBrush.com.cy  Fact Sheet for Healthcare Providers: https://pope.com/  This test is not yet  approved or  cleared by the [place] FDA and has been authorized for detection and/or diagnosis of SARS-CoV-2 by FDA under an Emergency Use Authorization (EUA).  This EUA will remain in effect (meaning this test can be used) for the duration of the COVID-19 declaration under Section 564(b)(1) of the Act, 21 U.S.C. section 360bbb-3(b)(1), unless the authorization is terminated or revoked sooner.  Performed at [facility], [address] [place], [place] [address]      Labs: BNP (last 3 results) No results for input(s): BNP in the last 8760 hours. Basic Metabolic Panel: Recent Labs  Lab 02/02/20 1514 02/02/20 2003 02/03/20 0734  NA 138  137  --  138  K 5.5*  5.4* 3.5 3.6  CL 106  106  --  105  CO2 19*  22  --  23  GLUCOSE 99  99  --  81  BUN 16  16  --  17  CREATININE 0.60*  0.62  --  0.67  CALCIUM 8.9  8.8*  --  8.9   Liver Function Tests: Recent Labs  Lab 02/02/20 1514 02/03/20 0734  AST 29 18  ALT 12 13  ALKPHOS 69 69  BILITOT 1.5* 1.1  PROT 6.9 6.8  ALBUMIN 3.8 3.6   No results for input(s): LIPASE, AMYLASE in the last 168 hours. No results for input(s): AMMONIA in the last 168 hours. CBC: Recent Labs  Lab 02/02/20 1514 02/03/20 0734  WBC 9.3 7.9  NEUTROABS 6.0  --   HGB 12.9* 12.0*  HCT 40.4 36.7*  MCV 102.8* 100.0  PLT 189 223   Cardiac Enzymes: No results for input(s): CKTOTAL, CKMB, CKMBINDEX, TROPONINI in the last 168 hours. BNP: Invalid input(s): POCBNP CBG: No results for input(s): GLUCAP in the last 168 hours. D-Dimer No results for input(s): DDIMER in the last 72 hours. Hgb A1c Recent Labs    02/03/20 0734  HGBA1C 6.1*   Lipid Profile Recent Labs    02/03/20 0734  CHOL 130  HDL 57  LDLCALC 64  TRIG 44  CHOLHDL 2.3   Thyroid function studies No results for input(s): TSH, T4TOTAL, T3FREE, THYROIDAB in the last 72 hours.  Invalid input(s): FREET3 Anemia work up No results for input(s): VITAMINB12, FOLATE,  FERRITIN, TIBC, IRON, RETICCTPCT in the last 72 hours. Urinalysis    Component Value Date/Time   COLORURINE YELLOW 02/02/2020 0736   APPEARANCEUR CLEAR 02/02/2020 0736   APPEARANCEUR Clear 11/12/2017 1144   LABSPEC 1.023 02/02/2020 0736   PHURINE 6.0 02/02/2020 0736   GLUCOSEU NEGATIVE 02/02/2020 0736   HGBUR NEGATIVE 02/02/2020 0736   BILIRUBINUR NEGATIVE 02/02/2020 0736   BILIRUBINUR Negative 11/12/2017 1144   KETONESUR 20 (A) 02/02/2020 0736   PROTEINUR NEGATIVE 02/02/2020 0736   UROBILINOGEN negative 11/04/2012 1302   NITRITE NEGATIVE 02/02/2020 0736   LEUKOCYTESUR NEGATIVE 02/02/2020 0736   Sepsis Labs Invalid input(s): PROCALCITONIN,  WBC,  LACTICIDVEN Microbiology Recent Results (from the past 240 hour(s))  SARS Coronavirus 2 by RT PCR (hospital order, performed in [facility] hospital lab) Nasopharyngeal Nasopharyngeal Swab     Status: None   Collection Time: 02/02/20  7:51 PM   Specimen: Nasopharyngeal Swab  Result Value Ref Range Status   SARS Coronavirus 2 NEGATIVE NEGATIVE Final    Comment: (NOTE) SARS-CoV-2 target nucleic acids are NOT DETECTED.  The SARS-CoV-2 RNA is generally detectable in upper and lower respiratory specimens during the acute phase of infection. The lowest concentration of SARS-CoV-2 viral copies this assay can detect is 250 copies / mL. A negative result does not preclude SARS-CoV-2 infection and should not be used as the sole basis for treatment or other patient management decisions.  A negative result may occur with improper specimen collection / handling, submission of specimen other than nasopharyngeal swab, presence of viral mutation(s) within the areas targeted by this assay, and inadequate number of viral copies (<250 copies / mL). A negative result must be combined with clinical observations, patient history, and epidemiological information.  Fact Sheet for Patients:   BoilerBrush.com.cy  Fact Sheet for  Healthcare Providers: https://pope.com/  This test is not yet approved or  cleared by the [place] FDA and has been authorized for detection and/or diagnosis of SARS-CoV-2 by FDA under an Emergency Use Authorization (EUA).  This EUA will remain in effect (meaning this test  can be used) for the duration of the COVID-19 declaration under Section 564(b)(1) of the Act, 21 U.S.C. section 360bbb-3(b)(1), unless the authorization is terminated or revoked sooner.  Performed at [facility], [address] [place], [place] [address]      Time coordinating discharge: Over 30 minutes  SIGNED:   Laverna Peace, MD  [facility] 02/05/2020, 2:19 PM Pager   If 7PM-7AM, please contact night-coverage [email or web address] Password [email or web address]

## 2020-02-05 NOTE — Procedures (Signed)
Patient Name: [name]  MRN: [id number]  Epilepsy Attending: Charlsie Quest  Referring Physician/Provider: Cephus Richer, NP Date: 02/05/2020 Duration: 23.41 minutes  Patient history: 64 year old male with history of TBI, chronic dysarthria and seizure disorder who presented with a spell of worsening speech.  EEG evaluate for seizures.  Level of alertness: Awake, asleep  AEDs during EEG study: None  Technical aspects: This EEG study was done with scalp electrodes positioned according to the 10-20 International system of electrode placement. Electrical activity was acquired at a sampling rate of 500Hz  and reviewed with a high frequency filter of 70Hz  and a low frequency filter of 1Hz . EEG data were recorded continuously and digitally stored.   Description: The posterior dominant rhythm consists of 8 Hz activity of moderate voltage (25-35 uV) seen predominantly in posterior head regions, symmetric and reactive to eye opening and eye closing. Sleep was characterized by vertex waves, sleep spindles (12 to 14 Hz), maximal frontocentral region.  Intermittent left more than right temporal 2 to 3 Hz delta slowing was also noted.  Hyperventilation and photic stimulation were not performed.     ABNORMALITY -Intermittent delta slowing, left more than right temporal region  IMPRESSION: This study is suggestive of nonspecific cortical dysfunction in left more than right temporal region. No seizures or epileptiform discharges were seen throughout the recording.  Adonna Horsley 

## 2020-02-05 NOTE — TOC Transition Note (Signed)
Transition of Care [facility]) - CM/SW Discharge Note   Patient Details  Name: [name] MRN: [id number] Date of Birth: 08/30/1955  Transition of Care [facility]) CM/SW Contact:  Kermit Balo, RN Phone Number: 02/05/2020, 1:08 PM   Clinical Narrative:    Pt will d/c home today. CM has updated his sister and she will provide transport home.  Pt already has shower seat at home.  Resumption orders for [facility] services through [facility] entered and Clydie Braun with [facility] is aware of orders and d/c.    Final next level of care: Home w Home Health Services Barriers to Discharge: No Barriers Identified   Patient Goals and CMS Choice        Discharge Placement                       Discharge Plan and Services   Discharge Planning Services: CM Consult Post Acute Care Choice: Home Health          DME Arranged: Tub bench         HH Arranged: RN, PT HH Agency: [facility] ([facility]) Date HH Agency Contacted: 02/05/20   Representative spoke with at [facility] Agency: Clydie Braun  Social Determinants of Health (SDOH) Interventions     Readmission Risk Interventions Readmission Risk Prevention Plan 12/04/2019  Transportation Screening Complete  Medication Review Oceanographer) Complete  PCP or Specialist appointment within 3-5 days of discharge Not Complete  PCP/Specialist Appt Not Complete comments Patient discharging to SNF for rehab  HRI or Home Care Consult Complete  SW Recovery Care/Counseling Consult Complete  Palliative Care Screening Not Applicable  Skilled Nursing Facility Complete  Some recent data might be hidden

## 2020-02-05 NOTE — TOC Initial Note (Signed)
Transition of Care [facility]) - Initial/Assessment Note    Patient Details  Name: [name] MRN: [id number] Date of Birth: [birth year]  Transition of Care [facility]) CM/SW Contact:    Patrick Friar, RN Phone Number: 02/05/2020, 10:30 AM  Clinical Narrative:                 CM met with [name] and he states he lives with his sister that provides the needed transportation and his home medications. Pt with recommendations for a tub bench. Pt states his sister was working on getting one for home. CM asked to speak to his sister and he was in agreement. CM called and left a voice mail for his sister.  [name] with [facility] left voicemail that pt is active with them for [facility] PT/RN.  TOC following.  Expected Discharge Plan: [place] Barriers to Discharge: Continued Medical Work up   Patient Goals and CMS Choice        Expected Discharge Plan and Services Expected Discharge Plan: [place]   Discharge Planning Services: CM Consult Post Acute Care Choice: [place] arrangements for the past 2 months: Single Family Home                 DME Arranged: Tub bench         HH Arranged: RN, PT [place] Agency: [facility] ([facility])     Representative spoke with at [place]: [name]  Prior Living Arrangements/Services Living arrangements for the past 2 months: [place] Lives with:: Siblings Patient language and need for interpreter reviewed:: Yes Do you feel safe going back to the place where you live?: Yes      Need for Family Participation in Patient Care: Yes (Comment) Care giver support system in place?: Yes (comment) Current home services: DME (wheelchair) Criminal Activity/Legal Involvement Pertinent to Current Situation/Hospitalization: No - Comment as needed  Activities of Daily Living      Permission Sought/Granted                  Emotional Assessment Appearance:: Appears stated  age Attitude/Demeanor/Rapport: Engaged Affect (typically observed): Accepting Orientation: : Oriented to Self, Oriented to Place, Oriented to Situation   Psych Involvement: No (comment)  Admission diagnosis:  TIA (transient ischemic attack) [G45.9] Dysarthria [R47.1] Patient Active Problem List   Diagnosis Date Noted  . Dysarthria 02/04/2020  . TIA (transient ischemic attack) 02/02/2020  . Weakness 12/04/2019  . Ataxia 12/03/2019  . Hypokalemia 12/03/2019  . Macrocytic anemia 12/03/2019  . Cellulitis of left thumb 11/29/2019  . Mild tetrahydrocannabinol (THC) abuse 08/09/2019  . Controlled substance agreement signed 03/14/2018  . DDD (degenerative disc disease), cervical 08/24/2017  . Arthritis 07/29/2017  . Memory loss or impairment 07/08/2016  . Loss of weight 07/07/2013  . Frequent urination 11/04/2012  . Seizure disorder ([place]) 11/04/2012  . RLS (restless legs syndrome) 11/04/2012  . HLD (hyperlipidemia) 11/04/2012  . CAD (coronary artery disease) 11/04/2012  . HEAD TRAUMA, CLOSED 04/16/2009  . Hyperlipidemia 06/05/2008  . Generalized anxiety disorder 06/05/2008  . History of alcohol abuse 06/05/2008  . TOBACCO ABUSE 06/05/2008  . Depression, recurrent ([place]) 06/05/2008  . OBSTRUCTIVE SLEEP APNEA 06/05/2008  . Essential hypertension 06/05/2008  . CEREBRAL ANEURYSM 06/05/2008  . COPD (chronic obstructive pulmonary disease) ([place]) 06/05/2008  . GERD 06/05/2008  . HEADACHE, CHRONIC 06/05/2008   PCP:  [name], Patrick Kaufmann, MD Pharmacy:   [facility] -  [place], [place] [address] Phone: [phone] Fax: [phone]     Social Determinants of Health (SDOH) Interventions    Readmission Risk Interventions Readmission Risk Prevention Plan 12/04/2019  Transportation Screening Complete  Medication Review Press photographer) Complete  PCP or Specialist appointment within 3-5 days of discharge Not Complete   PCP/Specialist Appt Not Complete comments Patient discharging to SNF for rehab  [place] or Home Care Consult Complete  SW Recovery Care/Counseling Consult Complete  Palliative Care Screening Not Applicable  Skilled Nursing Facility Complete  Some recent data might be hidden

## 2020-02-05 NOTE — Progress Notes (Signed)
NURSING PROGRESS NOTE  SU[facility]DE[facility] WIETING [id number] Discharge Data: 02/05/2020 3:53 PM Attending Provider: No att. providers found [name]:[id number], Elige Radon, MD     [name] to be discharged per MD order.  Discussed with the patient and the patient's sister the After Visit Summary and all questions fully answered. All IV's discontinued with no bleeding noted. All belongings returned to patient for patient to take home.   Last Vital Signs:  Blood pressure 112/64, pulse 68, temperature 97.7 F (36.5 C), temperature source Oral, resp. rate 18, height 5\' 7"  (1.702 m), weight 62.6 kg, SpO2 95 %.  Discharge Medication List Allergies as of 02/05/2020      Reactions   Clonidine Rash      Medication List    STOP taking these medications   acetaminophen 325 MG tablet Commonly known as: TYLENOL   multivitamin with minerals Tabs tablet     TAKE these medications   albuterol 108 (90 Base) MCG/ACT inhaler Commonly known as: VENTOLIN HFA 2 PUFFS EVERY 6 HOURS AS NEEDED FOR WHEEZING OR SHORTNESS OF BREATH   aspirin EC 325 MG tablet Take 1 tablet (325 mg total) by mouth daily. What changed:   medication strength  how much to take  when to take this   busPIRone 10 MG tablet Commonly known as: BUSPAR Take 10 mg by mouth 3 (three) times daily.   esomeprazole 40 MG capsule Commonly known as: NEXIUM TAKE (1) CAPSULE DAILY What changed: See the new instructions.   folic acid 1 MG tablet Commonly known as: FOLVITE Take 1 tablet (1 mg total) by mouth daily.   ipratropium-albuterol 0.5-2.5 (3) MG/3ML Soln Commonly known as: DUONEB Take 3 mLs by nebulization every 4 (four) hours as needed. What changed: when to take this   KP VITAMIN B-12 PO Take 1 tablet by mouth daily.   Melatonin 10 MG Tabs Take 10 mg by mouth at bedtime. What changed: how much to take   metoprolol succinate 25 MG 24 hr tablet Commonly known as: TOPROL-XL Take 1 tablet (25 mg total) by mouth  daily.   mirabegron ER 25 MG Tb24 tablet Commonly known as: Myrbetriq Take 1 tablet (25 mg total) by mouth daily.   mupirocin ointment 2 % Commonly known as: BACTROBAN PLACE 1 APPLICATION INTO THE NOSE 2 TIMES A DAY What changed:   how much to take  how to take this  when to take this  additional instructions   niacin 1000 MG CR tablet Commonly known as: NIASPAN TAKE 1 TABLET AT BEDTIME   PARoxetine 20 MG tablet Commonly known as: Paxil Take 1 tablet (20 mg total) by mouth daily.   potassium chloride 10 MEQ tablet Commonly known as: KLOR-CON Take 1 tablet (10 mEq total) by mouth daily.   pramipexole 0.5 MG tablet Commonly known as: MIRAPEX Take 2 tablets (1 mg total) by mouth at bedtime.   risperiDONE 2 MG tablet Commonly known as: RISPERDAL Take 1 tablet (2 mg total) by mouth at bedtime.   simvastatin 20 MG tablet Commonly known as: ZOCOR Take 1 tablet (20 mg total) by mouth at bedtime. What changed: how much to take   thiamine 100 MG tablet Take 1 tablet (100 mg total) by mouth daily.

## 2020-02-05 NOTE — Progress Notes (Signed)
EEG completed, results pending. 

## 2020-02-05 NOTE — Progress Notes (Signed)
Subjective: No complaints.  Objective: Current vital signs: BP 112/64 (BP Location: Left Arm)   Pulse 68   Temp 97.7 F (36.5 C) (Oral)   Resp 18   Ht 5\' 7"  (1.702 m)   Wt 62.6 kg   SpO2 95%   BMI 21.61 kg/m  Vital signs in last 24 hours: Temp:  [97.7 F (36.5 C)-98.3 F (36.8 C)] 97.7 F (36.5 C) (08/23 1018) Pulse Rate:  [61-68] 68 (08/23 1018) Resp:  [18] 18 (08/23 1018) BP: (105-113)/(52-64) 112/64 (08/23 1018) SpO2:  [94 %-98 %] 95 % (08/23 1018)  Intake/Output from previous day: 08/22 0701 - 08/23 0700 In: 820 [P.O.:820] Out: 325 [Urine:325] Intake/Output this shift: Total I/O In: 360 [P.O.:360] Out: 400 [Urine:400] Nutritional status:  Diet Order            Diet Heart Room service appropriate? Yes; Fluid consistency: Thin  Diet effective now                HEENT: No conjunctival injection Ext: No edema  Neurologic Exam: Ment: Pleasant and cooperative. Speech dysarthric with nasal quality - patient states that this is his baseline. No expressive or receptive aphasia noted. Follows all commands.  CN: EOMI with saccadic pursuits noted. Smile symmetric. Torticollis noted.  Motor: RUE 5/5 LUE 4/5 proximally with preserved grip strength BLE 5/5 Sensory: Intact to FT x 4   Lab Results: No results found for this or any previous visit (from the past 48 hour(s)).  Recent Results (from the past 240 hour(s))  SARS Coronavirus 2 by RT PCR (hospital order, performed in [facility] hospital lab) Nasopharyngeal Nasopharyngeal Swab     Status: None   Collection Time: 02/02/20  7:51 PM   Specimen: Nasopharyngeal Swab  Result Value Ref Range Status   SARS Coronavirus 2 NEGATIVE NEGATIVE Final    Comment: (NOTE) SARS-CoV-2 target nucleic acids are NOT DETECTED.  The SARS-CoV-2 RNA is generally detectable in upper and lower respiratory specimens during the acute phase of infection. The lowest concentration of SARS-CoV-2 viral copies this assay can detect is  250 copies / mL. A negative result does not preclude SARS-CoV-2 infection and should not be used as the sole basis for treatment or other patient management decisions.  A negative result may occur with improper specimen collection / handling, submission of specimen other than nasopharyngeal swab, presence of viral mutation(s) within the areas targeted by this assay, and inadequate number of viral copies (<250 copies / mL). A negative result must be combined with clinical observations, patient history, and epidemiological information.  Fact Sheet for Patients:   02/04/20  Fact Sheet for Healthcare Providers: BoilerBrush.com.cy  This test is not yet approved or  cleared by the https://pope.com/ FDA and has been authorized for detection and/or diagnosis of SARS-CoV-2 by FDA under an Emergency Use Authorization (EUA).  This EUA will remain in effect (meaning this test can be used) for the duration of the COVID-19 declaration under Section 564(b)(1) of the Act, 21 U.S.C. section 360bbb-3(b)(1), unless the authorization is terminated or revoked sooner.  Performed at [facility], [address], [place], [place]     Lipid Panel Recent Labs    02/03/20 0734  CHOL 130  TRIG 44  HDL 57  CHOLHDL 2.3  VLDL 9  LDLCALC 64    Studies/Results: MR ANGIO HEAD WO CONTRAST  Result Date: 02/03/2020 CLINICAL DATA:  Slurred speech EXAM: MRI HEAD WITHOUT CONTRAST MRA HEAD WITHOUT CONTRAST TECHNIQUE: Multiplanar, multiecho pulse  sequences of the brain and surrounding structures were obtained without intravenous contrast. Angiographic images of the head were obtained using MRA technique without contrast. COMPARISON:  Brain MRI 12/05/2019 FINDINGS: MRI HEAD FINDINGS Brain: No acute infarction, hemorrhage, hydrocephalus, extra-axial collection or mass lesion. 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 Chronic small vessel ischemia with confluent periventricular FLAIR hyperintensity. No acute hemorrhage, hydrocephalus, or masslike finding. Remote cortically based hemorrhage along the high right frontal cortex, nonspecific in isolation. Vascular: Normal flow voids. Skull and upper cervical spine: Normal marrow signal. Sinuses/Orbits: Chronic left maxillary sinusitis with atelectasis and sclerotic wall thickening. Mucosal thickening is mild. MRA HEAD FINDINGS Atheromatous irregularity of the carotid siphons which is likely accentuated by artifact. No flow reducing stenosis suspected. Narrowing of the left A1 segment, likely accentuated by hypoplasia. No major branch occlusion or M1 flow limiting stenosis. There is a mild or moderate left M2 stenosis serving the upper division. Widespread atheromatous irregularity to MCA branches. Poor flow in the left vertebral artery compared to the right, but more robust at the level of the PICA origin. The basilar shows atheromatous irregularity without significant stenosis. Atheromatous irregularity to bilateral PCA branches. Negative for aneurysm. IMPRESSION: Brain MRI: 1. No acute finding. 2. Chronic small vessel ischemia with remote lacunar infarcts in the left more than right thalamus. Wallerian degeneration/atrophy is seen at the left cortical spinal radiations. Intracranial MRA: 1. No emergent finding. 2. Extensive intracranial atherosclerosis, as above Electronically Signed   By: Marnee Spring M.D.   On: 02/03/2020 14:48   MR BRAIN WO CONTRAST  Result Date: 02/03/2020 CLINICAL DATA:  Slurred speech EXAM: MRI HEAD WITHOUT CONTRAST MRA HEAD WITHOUT CONTRAST TECHNIQUE: Multiplanar, multiecho pulse sequences of the brain and surrounding structures were obtained without intravenous contrast. Angiographic images of the head were obtained using MRA technique without contrast. COMPARISON:  Brain MRI 12/05/2019 FINDINGS: MRI HEAD FINDINGS Brain: No acute  infarction, hemorrhage, hydrocephalus, extra-axial collection or mass lesion. 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 Chronic small vessel ischemia with confluent periventricular FLAIR hyperintensity. No acute hemorrhage, hydrocephalus, or masslike finding. Remote cortically based hemorrhage along the high right frontal cortex, nonspecific in isolation. Vascular: Normal flow voids. Skull and upper cervical spine: Normal marrow signal. Sinuses/Orbits: Chronic left maxillary sinusitis with atelectasis and sclerotic wall thickening. Mucosal thickening is mild. MRA HEAD FINDINGS Atheromatous irregularity of the carotid siphons which is likely accentuated by artifact. No flow reducing stenosis suspected. Narrowing of the left A1 segment, likely accentuated by hypoplasia. No major branch occlusion or M1 flow limiting stenosis. There is a mild or moderate left M2 stenosis serving the upper division. Widespread atheromatous irregularity to MCA branches. Poor flow in the left vertebral artery compared to the right, but more robust at the level of the PICA origin. The basilar shows atheromatous irregularity without significant stenosis. Atheromatous irregularity to bilateral PCA branches. Negative for aneurysm. IMPRESSION: Brain MRI: 1. No acute finding. 2. Chronic small vessel ischemia with remote lacunar infarcts in the left more than right thalamus. Wallerian degeneration/atrophy is seen at the left cortical spinal radiations. Intracranial MRA: 1. No emergent finding. 2. Extensive intracranial atherosclerosis, as above Electronically Signed   By: Marnee Spring M.D.   On: 02/03/2020 14:48    Medications:  Scheduled: . aspirin EC  81 mg Oral Q breakfast  . busPIRone  10 mg Oral TID  . folic acid  1 mg  Oral Daily  . Gerhardt's butt cream   Topical TID  . heparin  5,000 Units Subcutaneous Q8H  . melatonin  9 mg Oral QHS  . metoprolol succinate  25 mg Oral  Daily  . mirabegron ER  25 mg Oral Daily  . niacin  1,000 mg Oral QHS  . pantoprazole  40 mg Oral Daily  . PARoxetine  20 mg Oral Daily  . pramipexole  1 mg Oral QHS  . risperiDONE  2 mg Oral QHS  . simvastatin  20 mg Oral QHS  . thiamine  100 mg Oral Daily  . vitamin B-12  1,000 mcg Oral Daily    Assessment: 64 year old male with transient worsening of baseline dysarthria, now resolved. The patient states that he himself did not perceive any problem, but that family wanted him to be evaluated. He has a history of TBI w/seizures- circumstances not clear, but seems to be an old injury and pt states he was taken off ASDs b/c no further seizures over last 5 yrs. Regarding his bBaseline dysarthria- possibly d/t TBI? Pt states it has been like this for more than 5 yrs. DDx for his presentation includes seizure with postictal dysarthria and TIA.  1. Exam reveals dysarthric, nasal speech which patient states is his baseline, well as LUE weakness with associated muscle atrophy. He does have significant ataxia with past pointing that he reports is residual since his TBI. 2. Spot EEG completed with results pending.  3. The patient clarifies that his last seizure, which was about 5 years ago, was associated with EtOH withdrawal. 4. 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 This was noted in recent CUS done on 12/04/19: >70% bilat.   Recommendations: 1. Await official EEG report. 2. If EEG is negative, no anticonvulsant is indicated.  3. Increase ASA to 325 mg po qd.  4. Outpatient Neurology follow up for the patient's chronic LUE weakness and cerebellar signs in the context of prior TBI.      LOS: 1 day   @Electronically  signed: Dr. 02/05/2020  12:06 PM

## 2020-02-07 ENCOUNTER — Telehealth: Payer: Self-pay | Admitting: Family Medicine

## 2020-02-07 NOTE — Telephone Encounter (Signed)
Pt is very weak, tired and just not himself per [name] at [facility]. He was just released from hospital yesterday and they say nothing is wrong. [name] says his bp today is 70/56, his sister does not know what to do, please call [name] the nurse at [phone]

## 2020-02-08 NOTE — Telephone Encounter (Signed)
Tina at [facility] aware of orders. She will have family call for appt

## 2020-02-08 NOTE — Telephone Encounter (Signed)
Have him hold his metoprolol for now and give him plenty of fluids, please get him in ASAP for visit so we can evaluate, watch for any signs of infection such as urinary tract infections.  Let me know what happens with the blood pressures over the next few days.

## 2020-02-14 ENCOUNTER — Other Ambulatory Visit: Payer: Self-pay

## 2020-02-14 ENCOUNTER — Ambulatory Visit (INDEPENDENT_AMBULATORY_CARE_PROVIDER_SITE_OTHER): Payer: Medicare Other | Admitting: Family Medicine

## 2020-02-14 ENCOUNTER — Encounter: Payer: Self-pay | Admitting: Family Medicine

## 2020-02-14 VITALS — BP 114/62 | HR 64 | Temp 98.0°F | Ht 67.0 in | Wt 136.0 lb

## 2020-02-14 DIAGNOSIS — I959 Hypotension, unspecified: Secondary | ICD-10-CM

## 2020-02-14 DIAGNOSIS — Z8673 Personal history of transient ischemic attack (TIA), and cerebral infarction without residual deficits: Secondary | ICD-10-CM | POA: Diagnosis not present

## 2020-02-14 NOTE — Progress Notes (Signed)
BP 114/62   Pulse 64   Temp 98 F (36.7 C)   Ht 5\' 7"  (1.702 m)   Wt 136 lb (61.7 kg)   SpO2 98%   BMI 21.30 kg/m    Subjective:   Patient ID: [name], male    DOB: March 30, 1956, 64 y.o.   MRN: [id number]  HPI: [name] is a 64 y.o. male presenting on 02/14/2020 for Medical Management of Chronic Issues and Hypotension   HPI Patient is coming in for follow-up for TIA and hypotension.  He was in the hospital on 02/02/2019 in the emergency department for TIA.  His blood pressure has been running down since that time.  He did feel some lightheadedness and dizziness at the time but has not as much since.  He was discharged on 02/05/2019.  He was having some weakness and was worsening and they showed that he had ataxia and dysarthria was consulted from neurology was having left-sided weakness.  Most all of that is gradually back to normal essentially and although he still unstable in his gait, he was before.  Relevant past medical, surgical, family and social history reviewed and updated as indicated. Interim medical history since our last visit reviewed. Allergies and medications reviewed and updated.  Review of Systems  Constitutional: Negative for chills and fever.  Eyes: Negative for visual disturbance.  Respiratory: Negative for shortness of breath and wheezing.   Cardiovascular: Negative for chest pain and leg swelling.  Musculoskeletal: Positive for gait problem.  Skin: Negative for rash.  Neurological: Positive for dizziness, weakness and light-headedness.  All other systems reviewed and are negative.   Per HPI unless specifically indicated above   Allergies as of 02/14/2020      Reactions   Clonidine Rash      Medication List       Accurate as of February 14, 2020 11:58 AM. If you have any questions, ask your nurse or doctor.        albuterol 108 (90 Base) MCG/ACT inhaler Commonly known as: VENTOLIN HFA 2 PUFFS EVERY 6 HOURS AS NEEDED FOR  WHEEZING OR SHORTNESS OF BREATH   aspirin EC 325 MG tablet Take 1 tablet (325 mg total) by mouth daily.   busPIRone 10 MG tablet Commonly known as: BUSPAR Take 10 mg by mouth 3 (three) times daily.   esomeprazole 40 MG capsule Commonly known as: NEXIUM TAKE (1) CAPSULE DAILY What changed: See the new instructions.   folic acid 1 MG tablet Commonly known as: FOLVITE Take 1 tablet (1 mg total) by mouth daily.   ipratropium-albuterol 0.5-2.5 (3) MG/3ML Soln Commonly known as: DUONEB Take 3 mLs by nebulization every 4 (four) hours as needed. What changed: when to take this   KP VITAMIN B-12 PO Take 1 tablet by mouth daily.   Melatonin 10 MG Tabs Take 10 mg by mouth at bedtime. What changed: how much to take   metoprolol succinate 25 MG 24 hr tablet Commonly known as: TOPROL-XL Take 1 tablet (25 mg total) by mouth daily.   mirabegron ER 25 MG Tb24 tablet Commonly known as: Myrbetriq Take 1 tablet (25 mg total) by mouth daily.   mupirocin ointment 2 % Commonly known as: BACTROBAN PLACE 1 APPLICATION INTO THE NOSE 2 TIMES A DAY What changed:   how much to take  how to take this  when to take this  additional instructions   niacin 1000 MG CR tablet Commonly known as: NIASPAN TAKE 1 TABLET  AT BEDTIME   PARoxetine 20 MG tablet Commonly known as: Paxil Take 1 tablet (20 mg total) by mouth daily.   potassium chloride 10 MEQ tablet Commonly known as: KLOR-CON Take 1 tablet (10 mEq total) by mouth daily.   pramipexole 0.5 MG tablet Commonly known as: MIRAPEX Take 2 tablets (1 mg total) by mouth at bedtime.   risperiDONE 2 MG tablet Commonly known as: RISPERDAL Take 1 tablet (2 mg total) by mouth at bedtime.   simvastatin 20 MG tablet Commonly known as: ZOCOR Take 1 tablet (20 mg total) by mouth at bedtime. What changed: how much to take   thiamine 100 MG tablet Take 1 tablet (100 mg total) by mouth daily.        Objective:   BP 114/62   Pulse 64    Temp 98 F (36.7 C)   Ht 5\' 7"  (1.702 m)   Wt 136 lb (61.7 kg)   SpO2 98%   BMI 21.30 kg/m   Wt Readings from Last 3 Encounters:  02/14/20 136 lb (61.7 kg)  02/02/20 138 lb (62.6 kg)  01/18/20 135 lb (61.2 kg)    Physical Exam Vitals and nursing note reviewed.  Constitutional:      General: He is not in acute distress.    Appearance: He is well-developed. He is not diaphoretic.  Eyes:     General: No scleral icterus.    Conjunctiva/sclera: Conjunctivae normal.  Neck:     Thyroid: No thyromegaly.  Cardiovascular:     Rate and Rhythm: Normal rate and regular rhythm.     Heart sounds: Normal heart sounds. No murmur heard.   Pulmonary:     Effort: Pulmonary effort is normal. No respiratory distress.     Breath sounds: Normal breath sounds. No wheezing.  Musculoskeletal:        General: Normal range of motion.     Cervical back: Neck supple.  Lymphadenopathy:     Cervical: No cervical adenopathy.  Skin:    General: Skin is warm and dry.     Findings: No rash.  Neurological:     Mental Status: He is alert and oriented to person, place, and time.     Coordination: Coordination normal.  Psychiatric:        Behavior: Behavior normal.       Assessment & Plan:   Problem List Items Addressed This Visit    None    Visit Diagnoses    Hypotension, unspecified hypotension type    -  Primary   History of transient ischemic attack (TIA)       Relevant Orders   Ambulatory referral to Neurology      Will refer to neurology for possible TIA, will monitor blood pressures closely. Follow up plan: Return in about 3 months (around 05/15/2020), or if symptoms worsen or fail to improve, for hypotension.  Counseling provided for all of the vaccine components No orders of the defined types were placed in this encounter.   14/06/2019, MD [facility] 02/14/2020, 11:58 AM

## 2020-02-23 ENCOUNTER — Telehealth: Payer: Self-pay | Admitting: Family Medicine

## 2020-02-23 NOTE — Telephone Encounter (Signed)
Okay thanks for the information 

## 2020-03-01 ENCOUNTER — Encounter: Payer: Self-pay | Admitting: Neurology

## 2020-03-02 ENCOUNTER — Other Ambulatory Visit: Payer: Self-pay | Admitting: Family Medicine

## 2020-03-05 ENCOUNTER — Other Ambulatory Visit: Payer: Self-pay

## 2020-03-05 ENCOUNTER — Other Ambulatory Visit: Payer: Self-pay | Admitting: Family Medicine

## 2020-03-05 ENCOUNTER — Ambulatory Visit (INDEPENDENT_AMBULATORY_CARE_PROVIDER_SITE_OTHER): Payer: Medicare Other | Admitting: Internal Medicine

## 2020-03-05 ENCOUNTER — Encounter: Payer: Self-pay | Admitting: Internal Medicine

## 2020-03-05 DIAGNOSIS — F17210 Nicotine dependence, cigarettes, uncomplicated: Secondary | ICD-10-CM | POA: Diagnosis not present

## 2020-03-05 DIAGNOSIS — J439 Emphysema, unspecified: Secondary | ICD-10-CM | POA: Diagnosis not present

## 2020-03-05 DIAGNOSIS — E782 Mixed hyperlipidemia: Secondary | ICD-10-CM

## 2020-03-05 NOTE — Assessment & Plan Note (Signed)

## 2020-03-05 NOTE — Assessment & Plan Note (Addendum)
Active smoker/ emphysema on cxr but never pfts -  03/05/2020   Walked RA  approx   200 ft  @ slow awkward  pace  stopped due to  Tired with no sob and sats 96% at end   Active smoker/ emphysema on cxr but never pfts  Apparently can't tolerate/ use mdi/ nebs and not having aecopd so no need for ICS at this point > continue duoneb up to qid          Each maintenance medication was reviewed in detail including emphasizing most importantly the difference between maintenance and prns and under what circumstances the prns are to be triggered using an action plan format where appropriate.  Total time for H and P, chart review, counseling,  directly observing portions of ambulatory 02 saturation study/ and generating customized AVS unique to this office visit / charting = 45 min       Pt /sister informed of the seriousness of COVID 19 infection as a direct risk to lung health  and safey and to close contacts and should continue to wear a facemask in public and minimize exposure to public locations but especially avoid any area or activity where non-close contacts are not observing distancing or wearing an appropriate face mask.  I strongly recommended she take either of the vaccines available through local drugstores based on updated information on millions of Americans treated with the Moderna and [employer] products  which have proven both safe and  effective even against the new delta variant.

## 2020-03-05 NOTE — Patient Instructions (Signed)
I very strongly recommend you get the moderna or pfizer vaccine as soon as possible based on your risk of dying from the virus  and the proven safety and benefit of these vaccines against even the delta variant.  This can save your life as well as  those of your loved ones,  especially if they are also not vaccinated.     Duoneb is up to every 4 hours if it helps    Remember there is a limit to what medical science can do if you don't stop smoking or catch the Covid 19 virus  - we can promise to try to help you, we just can't promise it will work if you don't follow the recommendations  I'm making today

## 2020-03-05 NOTE — Progress Notes (Signed)
[name], male    DOB: 1955/06/22,     MRN: [id number]   Brief patient profile:  72  yowm active smoker lives with sister with heavy etoh issues / remote head trauma> cognitive dysfunction/ leg tremors  referred to pulmonary clinic in [place]  03/05/2020 by EDP     History of Present Illness  03/05/2020  Pulmonary/ 1st office eval/[facility] / vaccine resistant per sister same way  Chief Complaint  Patient presents with  . Consult    Shortness of breath with exertion and family states his O2 drops. Denies cough Admission in August  Dyspnea:  Limited more by R leg jerking eg can't walk at store  Cough: minimal  Sleep: sleeps in recliner 30 degrees  SABA uses neb qid "so he can smoke p"  No obvious day to day or daytime variability or assoc excess/ purulent sputum or mucus plugs or hemoptysis or cp or chest tightness, subjective wheeze or overt sinus or hb symptoms.   Sleeping as above  without nocturnal  or early am exacerbation  of respiratory  c/o's or need for noct saba. Also denies any obvious fluctuation of symptoms with weather or environmental changes or other aggravating or alleviating factors except as outlined above   No unusual exposure hx or h/o childhood pna/ asthma or knowledge of premature birth.  Current Allergies, Complete Past Medical History, Past Surgical History, Family History, and Social History were reviewed in [employer] record.  ROS  The following are not active complaints unless bolded Hoarseness, sore throat, dysphagia, dental problems, itching, sneezing,  nasal congestion or discharge of excess mucus or purulent secretions, ear ache,   fever, chills, sweats, unintended wt loss or wt gain, classically pleuritic or exertional cp,  orthopnea pnd or arm/hand swelling  or leg swelling, presyncope, palpitations, abdominal pain, anorexia, nausea, vomiting, diarrhea  or change in bowel habits or change in bladder habits, change in stools  or change in urine, dysuria, hematuria,  rash, arthralgias, visual complaints, headache, numbness, weakness or ataxia or problems with walking or coordination,  change in mood or  memory.               Past Medical History:  Diagnosis Date  . Anxiety   . Cerebral aneurysm   . Closed head injury   . COPD (chronic obstructive pulmonary disease) (HCC)   . Depression   . GERD (gastroesophageal reflux disease)   . Hyperlipidemia   . Hypertension   . Seizures (HCC)   . Sleep apnea     Outpatient Medications Prior to Visit  Medication Sig Dispense Refill  . albuterol (VENTOLIN HFA) 108 (90 Base) MCG/ACT inhaler 2 PUFFS EVERY 6 HOURS AS NEEDED FOR WHEEZING OR SHORTNESS OF BREATH 18 g 1  . aspirin EC 325 MG tablet Take 1 tablet (325 mg total) by mouth daily. 30 tablet 1  . busPIRone (BUSPAR) 10 MG tablet TAKE  (1)  TABLET  THREE TIMES DAILY. 90 tablet 0  . Cyanocobalamin (KP VITAMIN B-12 PO) Take 1 tablet by mouth daily.    Marland Kitchen esomeprazole (NEXIUM) 40 MG capsule TAKE (1) CAPSULE DAILY (Patient taking differently: Take 40 mg by mouth daily. TAKE (1) CAPSULE DAILY) 30 capsule 5  . folic acid (FOLVITE) 1 MG tablet Take 1 tablet (1 mg total) by mouth daily. 30 tablet 2  . ipratropium-albuterol (DUONEB) 0.5-2.5 (3) MG/3ML SOLN Take 3 mLs by nebulization every 4 (four) hours as needed. (Patient taking differently: Take 3  mLs by nebulization in the morning, at noon, in the evening, and at bedtime. ) 1620 mL 3  . Melatonin 10 MG TABS Take 10 mg by mouth at bedtime. (Patient taking differently: Take 10-20 mg by mouth at bedtime. ) 90 tablet 3  . metoprolol succinate (TOPROL-XL) 25 MG 24 hr tablet Take 1 tablet (25 mg total) by mouth daily. 90 tablet 1  . mirabegron ER (MYRBETRIQ) 25 MG TB24 tablet Take 1 tablet (25 mg total) by mouth daily. 30 tablet 11  . mupirocin ointment (BACTROBAN) 2 % PLACE 1 APPLICATION INTO THE NOSE 2 TIMES A DAY (Patient taking differently: Apply 1 application topically 2  (two) times daily. ) 22 g 5  . niacin (NIASPAN) 1000 MG CR tablet TAKE 1 TABLET AT BEDTIME (Patient taking differently: Take 1,000 mg by mouth at bedtime. ) 90 tablet 1  . PARoxetine (PAXIL) 20 MG tablet Take 1 tablet (20 mg total) by mouth daily. 30 tablet 3  . potassium chloride (KLOR-CON) 10 MEQ tablet Take 1 tablet (10 mEq total) by mouth daily. 6 tablet 0  . pramipexole (MIRAPEX) 0.5 MG tablet Take 2 tablets (1 mg total) by mouth at bedtime. 90 tablet 1  . risperidone (RISPERDAL) 2 MG tablet Take 1 tablet (2 mg total) by mouth at bedtime. 90 tablet 3  . simvastatin (ZOCOR) 20 MG tablet Take 1 tablet (20 mg total) by mouth at bedtime. (Patient taking differently: Take 10 mg by mouth at bedtime. ) 90 tablet 3  . thiamine 100 MG tablet Take 1 tablet (100 mg total) by mouth daily. 30 tablet 2   No facility-administered medications prior to visit.     Objective:     BP 120/62 (BP Location: Left Arm, Patient Position: Sitting, Cuff Size: Normal)   Pulse 60   Temp 97.6 F (36.4 C) (Temporal)   Ht 5' 7.5" (1.715 m)   Wt 134 lb 3.2 oz (60.9 kg)   SpO2 99%   BMI 20.71 kg/m   SpO2: 99 %   amb frail appearing wm walks with very awkward gait  HEENT : pt wearing mask not removed for exam due to covid - 19 concerns.   NECK :  without JVD/Nodes/TM/ nl carotid upstrokes bilaterally   LUNGS: no acc muscle use,  Min barrel  contour chest wall with bilateral  slightly decreased bs s audible wheeze and  without cough on insp or exp maneuvers and min  Hyperresonant  to  percussion bilaterally     CV:  RRR  no s3 or murmur or increase in P2, and no edema   ABD:  soft and nontender with pos end  insp Hoover's  in the supine position. No bruits or organomegaly appreciated, bowel sounds nl  MS:   Nl gait/  ext warm without deformities, calf tenderness, cyanosis or clubbing No obvious joint restrictions   SKIN: warm and dry without lesions    NEURO:  alert, approp, nl sensorium with  no  motor or cerebellar deficits apparent.        I personally reviewed images and agree with radiology impression as follows:  CXR:   11/07/19 PA and lateral 1. Emphysema, no acute airspace disease.      Assessment   COPD (chronic obstructive pulmonary disease) (HCC) Active smoker/ emphysema on cxr but never pfts -  03/05/2020   Walked RA  approx   200 ft  @ slow awkward  pace  stopped due to  Tired with no sob and  sats 96% at end   Active smoker/ emphysema on cxr but never pfts  Apparently can't tolerate/ use mdi/ nebs and not having aecopd so no need for ICS at this point > continue duoneb up to qid     Pt /sister informed of the seriousness of COVID 19 infection as a direct risk to lung health  and safey and to close contacts and should continue to wear a facemask in public and minimize exposure to public locations but especially avoid any area or activity where non-close contacts are not observing distancing or wearing an appropriate face mask.  I strongly recommended she take either of the vaccines available through local drugstores based on updated information on millions of Americans treated with the Moderna and [employer] products  which have proven both safe and  effective even against the new delta variant.       Cigarette smoker 4-5 min discussion re active cigarette smoking in addition to office E&M  Ask about tobacco use:   ongoing Advise quitting   I emphasized that although we never turn away smokers from the pulmonary clinic, we do ask that they understand that the recommendations that we make  won't work nearly as well in the presence of continued cigarette exposure. In fact, we may very well  reach a point where we can't promise to help the patient if he/she can't quit smoking. (We can and will promise to try to help, we just can't promise what we recommend will really work)  Assess willingness:  Not committed at this point Assist in quit attempt:  Per PCP when ready Arrange  follow up:   Follow up per Primary Care planned     Each maintenance medication was reviewed in detail including emphasizing most importantly the difference between maintenance and prns and under what circumstances the prns are to be triggered using an action plan format where appropriate.  Total time for H and P, chart review, counseling,  directly observing portions of ambulatory 02 saturation study/ and generating customized AVS unique to this office visit / charting = 45 min      Sandrea Hughs, MD 03/05/2020

## 2020-03-07 NOTE — Progress Notes (Deleted)
NEUROLOGY CONSULTATION NOTE  [name] MRN: [id number] DOB: 01-Jan-1956  Referring provider: Arville Care, MD Primary care provider: Arville Care, MD  Reason for consult:  TIA  HISTORY OF PRESENT ILLNESS: [name] is a 64 year old ***-handed male with history of TBI *** who presents for TIA.  He is accompanied by *** who supplements history.  Hospital records reviewed.  CT and MRI personally reviewed.  ***.  He had symptomatic seizures ***.  He has residual left upper extremity weakness and cerebellar ***.  He was admitted to [facility] on 02/02/2020 for worsening dysarthria from baseline.  ***.  CT head showed no acute intracranial abnormality.  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  MRA of head showed extensive intracranial atherosclerosis ***.  TTE showed EF 55-60% with no cardiac source of emboli.  EEG showed left greater than right intermittent delta temporal slowing.  ***  PAST MEDICAL HISTORY: Past Medical History:  Diagnosis Date  . Anxiety   . Cerebral aneurysm   . Closed head injury   . COPD (chronic obstructive pulmonary disease) (HCC)   . Depression   . GERD (gastroesophageal reflux disease)   . Hyperlipidemia   . Hypertension   . Seizures (HCC)   . Sleep apnea     PAST SURGICAL HISTORY: No past surgical history on file.  MEDICATIONS: Current Outpatient Medications on File Prior to Visit  Medication Sig Dispense Refill  . albuterol (VENTOLIN HFA) 108 (90 Base) MCG/ACT inhaler 2 PUFFS EVERY 6 HOURS AS NEEDED FOR WHEEZING OR SHORTNESS OF BREATH 18 g 1  . aspirin EC 325 MG tablet Take 1 tablet (325 mg total) by mouth daily. 30 tablet 1  . busPIRone (BUSPAR) 10 MG tablet TAKE  (1)  TABLET  THREE TIMES DAILY. 90 tablet 0  . Cyanocobalamin (KP VITAMIN B-12 PO) Take 1 tablet by mouth daily.    Marland Kitchen esomeprazole  (NEXIUM) 40 MG capsule TAKE (1) CAPSULE DAILY (Patient taking differently: Take 40 mg by mouth daily. TAKE (1) CAPSULE DAILY) 30 capsule 5  . folic acid (FOLVITE) 1 MG tablet Take 1 tablet (1 mg total) by mouth daily. 30 tablet 2  . ipratropium-albuterol (DUONEB) 0.5-2.5 (3) MG/3ML SOLN Take 3 mLs by nebulization every 4 (four) hours as needed. (Patient taking differently: Take 3 mLs by nebulization in the morning, at noon, in the evening, and at bedtime. ) 1620 mL 3  . Melatonin 10 MG TABS Take 10 mg by mouth at bedtime. (Patient taking differently: Take 10-20 mg by mouth at bedtime. ) 90 tablet 3  . metoprolol succinate (TOPROL-XL) 25 MG 24 hr tablet Take 1 tablet (25 mg total) by mouth daily. 90 tablet 1  . mirabegron ER (MYRBETRIQ) 25 MG TB24 tablet Take 1 tablet (25 mg total) by mouth daily. 30 tablet 11  . mupirocin ointment (BACTROBAN) 2 % PLACE 1 APPLICATION INTO THE NOSE 2 TIMES A DAY (Patient taking differently: Apply 1 application topically 2 (two) times daily. ) 22 g 5  . niacin (NIASPAN) 1000 MG CR tablet TAKE 1 TABLET AT BEDTIME (Patient taking differently: Take 1,000 mg by mouth at bedtime. ) 90 tablet 1  . PARoxetine (PAXIL) 20 MG tablet Take 1 tablet (20 mg total) by mouth daily. 30 tablet 3  . potassium chloride (KLOR-CON) 10 MEQ tablet Take 1 tablet (10 mEq total) by mouth daily. 6  tablet 0  . pramipexole (MIRAPEX) 0.5 MG tablet Take 2 tablets (1 mg total) by mouth at bedtime. 90 tablet 1  . risperidone (RISPERDAL) 2 MG tablet Take 1 tablet (2 mg total) by mouth at bedtime. 90 tablet 3  . simvastatin (ZOCOR) 20 MG tablet TAKE 1 TABLET AT BEDTIME 90 tablet 1  . thiamine 100 MG tablet Take 1 tablet (100 mg total) by mouth daily. 30 tablet 2   No current facility-administered medications on file prior to visit.    ALLERGIES: Allergies  Allergen Reactions  . Clonidine Rash    FAMILY HISTORY: Family History  Problem Relation Age of Onset  . Obesity Sister    ***.  SOCIAL  HISTORY: Social History   Socioeconomic History  . Marital status: Single    Spouse name: Not on file  . Number of children: Not on file  . Years of education: Not on file  . Highest education level: Not on file  Occupational History  . Not on file  Tobacco Use  . Smoking status: Current Every Day Smoker    Packs/day: 1.00    Years: 50.00    Pack years: 50.00  . Smokeless tobacco: Never Used  Vaping Use  . Vaping Use: Never used  Substance and Sexual Activity  . Alcohol use: No  . Drug use: Yes    Types: Marijuana  . Sexual activity: Not Currently  Other Topics Concern  . Not on file  Social History Narrative  . Not on file   Social Determinants of Health   Financial Resource Strain:   . Difficulty of Paying Living Expenses: Not on file  Food Insecurity:   . Worried About Programme researcher, broadcasting/film/video in the Last Year: Not on file  . Ran Out of Food in the Last Year: Not on file  Transportation Needs:   . Lack of Transportation (Medical): Not on file  . Lack of Transportation (Non-Medical): Not on file  Physical Activity:   . Days of Exercise per Week: Not on file  . Minutes of Exercise per Session: Not on file  Stress:   . Feeling of Stress : Not on file  Social Connections:   . Frequency of Communication with Friends and Family: Not on file  . Frequency of Social Gatherings with Friends and Family: Not on file  . Attends Religious Services: Not on file  . Active Member of Clubs or Organizations: Not on file  . Attends Banker Meetings: Not on file  . Marital Status: Not on file  Intimate Partner Violence:   . Fear of Current or Ex-Partner: Not on file  . Emotionally Abused: Not on file  . Physically Abused: Not on file  . Sexually Abused: Not on file    REVIEW OF SYSTEMS: Constitutional: No fevers, chills, or sweats, no generalized fatigue, change in appetite Eyes: No visual changes, double vision, eye pain Ear, nose and throat: No hearing loss, ear  pain, nasal congestion, sore throat Cardiovascular: No chest pain, palpitations Respiratory:  No shortness of breath at rest or with exertion, wheezes GastrointestinaI: No nausea, vomiting, diarrhea, abdominal pain, fecal incontinence Genitourinary:  No dysuria, urinary retention or frequency Musculoskeletal:  No neck pain, back pain Integumentary: No rash, pruritus, skin lesions Neurological: as above Psychiatric: No depression, insomnia, anxiety Endocrine: No palpitations, fatigue, diaphoresis, mood swings, change in appetite, change in weight, increased thirst Hematologic/Lymphatic:  No purpura, petechiae. Allergic/Immunologic: no itchy/runny eyes, nasal congestion, recent allergic reactions, rashes  PHYSICAL  EXAM: *** General: No acute distress.  Patient appears ***-groomed.  *** Head:  Normocephalic/atraumatic Eyes:  fundi examined but not visualized Neck: supple, no paraspinal tenderness, full range of motion Back: No paraspinal tenderness Heart: regular rate and rhythm Lungs: Clear to auscultation bilaterally. Vascular: No carotid bruits. Neurological Exam: Mental status: alert and oriented to person, place, and time, recent and remote memory intact, fund of knowledge intact, attention and concentration intact, speech fluent and not dysarthric, language intact. Cranial nerves: CN I: not tested CN II: pupils equal, round and reactive to light, visual fields intact CN III, IV, VI:  full range of motion, no nystagmus, no ptosis CN V: facial sensation intact CN VII: upper and lower face symmetric CN VIII: hearing intact CN IX, X: gag intact, uvula midline CN XI: sternocleidomastoid and trapezius muscles intact CN XII: tongue midline Bulk & Tone: normal, no fasciculations. Motor:  5/5 throughout *** Sensation:  Pinprick *** temperature *** and vibration sensation intact.  ***. Deep Tendon Reflexes:  2+ throughout, *** toes downgoing.  *** Finger to nose testing:  Without  dysmetria.  *** Heel to shin:  Without dysmetria.  *** Gait:  Normal station and stride.  Able to turn and tandem walk. Romberg ***.  IMPRESSION: ***  PLAN: ***  Thank you for allowing me to take part in the care of this patient.  Shon Millet, DO  CC: Arville Care, MD

## 2020-03-08 ENCOUNTER — Other Ambulatory Visit: Payer: Self-pay | Admitting: Family Medicine

## 2020-03-08 ENCOUNTER — Ambulatory Visit: Payer: Medicare Other | Admitting: Family Medicine

## 2020-03-08 DIAGNOSIS — J439 Emphysema, unspecified: Secondary | ICD-10-CM

## 2020-03-11 ENCOUNTER — Ambulatory Visit: Payer: Medicare Other | Admitting: Neurology

## 2020-03-13 ENCOUNTER — Other Ambulatory Visit: Payer: Self-pay | Admitting: Family Medicine

## 2020-03-13 DIAGNOSIS — L02611 Cutaneous abscess of right foot: Secondary | ICD-10-CM

## 2020-03-13 DIAGNOSIS — L03031 Cellulitis of right toe: Secondary | ICD-10-CM

## 2020-03-13 MED ORDER — MUPIROCIN 2 % EX OINT: TOPICAL_OINTMENT | CUTANEOUS | 5 refills | Status: AC

## 2020-03-13 NOTE — Telephone Encounter (Signed)
  Prescription Request  03/13/2020  What is the name of the medication or equipment? Butt cream  Have you contacted your pharmacy to request a refill? (if applicable) no  Which pharmacy would you like this sent to? [facility]   Patient notified that their request is being sent to the clinical staff for review and that they should receive a response within 2 business days.

## 2020-03-14 ENCOUNTER — Telehealth: Payer: Self-pay

## 2020-03-15 ENCOUNTER — Other Ambulatory Visit: Payer: Self-pay | Admitting: Family Medicine

## 2020-03-15 MED ORDER — GERHARDT'S BUTT CREAM: 1.0000 "application " | TOPICAL_CREAM | Freq: Every day | CUTANEOUS | 2 refills | Status: AC

## 2020-03-15 NOTE — Progress Notes (Unsigned)
I have put in a prescription for butt cream as phone in, can you please call it into the pharmacy because I cannot send electronically because of the compound

## 2020-03-15 NOTE — Telephone Encounter (Signed)
Did a phone in prescription, please call it in for butt cream, did he need any others?

## 2020-03-15 NOTE — Telephone Encounter (Signed)
Azzie notified via voicemail that med was sent in.

## 2020-03-21 ENCOUNTER — Ambulatory Visit: Payer: Medicare Other | Admitting: Family Medicine

## 2020-03-26 ENCOUNTER — Telehealth: Payer: Self-pay | Admitting: *Deleted

## 2020-03-26 NOTE — Telephone Encounter (Signed)
Clarification on the 'Gerhardt's Butt Cream' Will do 90g, 30g each of the Hydrocortison 1%, Nystatin cream & Zinc Oxide Oint

## 2020-03-28 ENCOUNTER — Other Ambulatory Visit: Payer: Self-pay | Admitting: Family Medicine

## 2020-04-04 ENCOUNTER — Telehealth: Payer: Self-pay | Admitting: *Deleted

## 2020-04-04 MED ORDER — OMEPRAZOLE 40 MG PO CPDR: 40.0000 mg | DELAYED_RELEASE_CAPSULE | Freq: Every day | ORAL | 3 refills | Status: DC

## 2020-04-04 NOTE — Addendum Note (Signed)
Addended by: Arville Care on: 04/04/2020 01:17 PM   Modules accepted: Orders

## 2020-04-04 NOTE — Telephone Encounter (Signed)
Fax from [employer] will no longer cover generic Nexium, they will cover Prilosec, can a new Rx be sent

## 2020-04-04 NOTE — Telephone Encounter (Signed)
Please let him know that I sent in Prilosec because his insurance company says Nexium is no longer covered. Arville Care, MD [facility] 04/04/2020, 1:17 PM

## 2020-05-01 ENCOUNTER — Other Ambulatory Visit: Payer: Self-pay | Admitting: Family Medicine

## 2020-05-03 ENCOUNTER — Ambulatory Visit: Payer: Medicare Other | Admitting: Vascular Surgery

## 2020-05-30 ENCOUNTER — Telehealth: Payer: Self-pay

## 2020-05-30 ENCOUNTER — Other Ambulatory Visit: Payer: Self-pay | Admitting: *Deleted

## 2020-05-30 DIAGNOSIS — B839 Helminthiasis, unspecified: Secondary | ICD-10-CM

## 2020-05-30 NOTE — Telephone Encounter (Signed)
Please order a GI stool panel that includes ova and parasites.

## 2020-05-30 NOTE — Telephone Encounter (Signed)
Sister wanted to how why no call back yet. She is aware that nurse sent message to [facility] and nurse will be rc.

## 2020-05-30 NOTE — Telephone Encounter (Signed)
Sister aware and will pick up stool sample containers tomorrow

## 2020-06-04 ENCOUNTER — Other Ambulatory Visit: Payer: Self-pay

## 2020-06-04 ENCOUNTER — Other Ambulatory Visit: Payer: Medicare Other

## 2020-06-10 ENCOUNTER — Other Ambulatory Visit: Payer: Self-pay | Admitting: *Deleted

## 2020-06-10 DIAGNOSIS — J439 Emphysema, unspecified: Secondary | ICD-10-CM

## 2020-06-10 MED ORDER — IPRATROPIUM-ALBUTEROL 0.5-2.5 (3) MG/3ML IN SOLN: RESPIRATORY_TRACT | 0 refills | Status: DC

## 2020-06-13 LAB — OVA AND PARASITE EXAMINATION

## 2020-06-17 ENCOUNTER — Ambulatory Visit: Payer: Medicare Other | Admitting: Family Medicine

## 2020-06-21 DIAGNOSIS — J449 Chronic obstructive pulmonary disease, unspecified: Secondary | ICD-10-CM | POA: Diagnosis not present

## 2020-06-28 ENCOUNTER — Other Ambulatory Visit: Payer: Self-pay | Admitting: Family Medicine

## 2020-06-28 DIAGNOSIS — E782 Mixed hyperlipidemia: Secondary | ICD-10-CM

## 2020-06-30 DIAGNOSIS — R270 Ataxia, unspecified: Secondary | ICD-10-CM | POA: Diagnosis not present

## 2020-06-30 DIAGNOSIS — M6281 Muscle weakness (generalized): Secondary | ICD-10-CM | POA: Diagnosis not present

## 2020-07-02 ENCOUNTER — Other Ambulatory Visit: Payer: Self-pay | Admitting: Family Medicine

## 2020-07-02 DIAGNOSIS — J439 Emphysema, unspecified: Secondary | ICD-10-CM

## 2020-07-11 ENCOUNTER — Other Ambulatory Visit: Payer: Self-pay | Admitting: *Deleted

## 2020-07-11 DIAGNOSIS — F339 Major depressive disorder, recurrent, unspecified: Secondary | ICD-10-CM

## 2020-07-11 DIAGNOSIS — F411 Generalized anxiety disorder: Secondary | ICD-10-CM

## 2020-07-11 MED ORDER — PRAMIPEXOLE DIHYDROCHLORIDE 0.5 MG PO TABS: 1.0000 mg | ORAL_TABLET | Freq: Every day | ORAL | 1 refills | Status: DC

## 2020-07-16 ENCOUNTER — Ambulatory Visit (INDEPENDENT_AMBULATORY_CARE_PROVIDER_SITE_OTHER): Payer: Medicare HMO | Admitting: Family Medicine

## 2020-07-16 ENCOUNTER — Encounter: Payer: Self-pay | Admitting: Family Medicine

## 2020-07-16 DIAGNOSIS — E782 Mixed hyperlipidemia: Secondary | ICD-10-CM

## 2020-07-16 DIAGNOSIS — I10 Essential (primary) hypertension: Secondary | ICD-10-CM

## 2020-07-16 MED ORDER — NIACIN ER (ANTIHYPERLIPIDEMIC) 1000 MG PO TBCR: 1000.0000 mg | EXTENDED_RELEASE_TABLET | Freq: Every day | ORAL | 3 refills | Status: DC

## 2020-07-16 MED ORDER — BUSPIRONE HCL 10 MG PO TABS
10.0000 mg | ORAL_TABLET | Freq: Three times a day (TID) | ORAL | 1 refills | Status: DC
Start: 2020-07-16 — End: 2020-08-22

## 2020-07-16 NOTE — Progress Notes (Signed)
Virtual Visit via telephone Note  I connected with [name] on 07/16/20 at 1518 by telephone and verified that I am speaking with the correct person using two identifiers. [name] is currently located at home and patient and sister are currently with her during visit. The provider, Patrick Radon Gradie Ohm, MD is located in their office at time of visit.  Call ended at 1527  I discussed the limitations, risks, security and privacy concerns of performing an evaluation and management service by telephone and the availability of in person appointments. I also discussed with the patient that there may be a patient responsible charge related to this service. The patient expressed understanding and agreed to proceed.   History and Present Illness: Hypertension Patient is currently on metoprolol, and their blood pressure today is unknown. Patient denies any lightheadedness or dizziness. Patient denies headaches, blurred vision, chest pains, shortness of breath, or weakness. Denies any side effects from medication and is content with current medication.   Hyperlipidemia Patient is coming in for recheck of his hyperlipidemia. The patient is currently taking simvastatin. They deny any issues with myalgias or history of liver damage from it. They deny any focal numbness or weakness or chest pain.   GERD Patient is currently on omeprazole.  She denies any major symptoms or abdominal pain or belching or burping. She denies any blood in her stool or lightheadedness or dizziness.   Anxiety Patient is taking buspirone and paxil and risperidone and his memory is ok.  Seems to be doing well, still misses his Xanax from before but had a failed drug screen couple years ago.  We will continue forward with this.  1. Essential hypertension   2. Mixed hyperlipidemia     Outpatient Encounter Medications as of 07/16/2020  Medication Sig  . albuterol (VENTOLIN HFA) 108 (90 Base) MCG/ACT inhaler 2  PUFFS EVERY 6 HOURS AS NEEDED FOR WHEEZING OR SHORTNESS OF BREATH  . busPIRone (BUSPAR) 10 MG tablet Take 1 tablet (10 mg total) by mouth 3 (three) times daily.  . Cyanocobalamin (KP VITAMIN B-12 PO) Take 1 tablet by mouth daily.  . folic acid (FOLVITE) 1 MG tablet Take 1 tablet (1 mg total) by mouth daily.  Marland Kitchen ipratropium-albuterol (DUONEB) 0.5-2.5 (3) MG/3ML SOLN NEBULIZE 1 VIAL 4 TIMES A DAY  . Melatonin 10 MG TABS Take 10 mg by mouth at bedtime. (Patient taking differently: Take 10-20 mg by mouth at bedtime. )  . metoprolol succinate (TOPROL-XL) 25 MG 24 hr tablet Take 1 tablet (25 mg total) by mouth daily.  . mirabegron ER (MYRBETRIQ) 25 MG TB24 tablet Take 1 tablet (25 mg total) by mouth daily.  . mupirocin ointment (BACTROBAN) 2 % PLACE 1 APPLICATION INTO THE NOSE 2 TIMES A DAY  . niacin (NIASPAN) 1000 MG CR tablet Take 1 tablet (1,000 mg total) by mouth at bedtime.  Marland Kitchen Nystatin (GERHARDT'S BUTT CREAM) CREA Apply 1 application topically daily. Equal parts nystatin and hydrocortisone and zinc oxide  . omeprazole (PRILOSEC) 40 MG capsule Take 1 capsule (40 mg total) by mouth daily.  Marland Kitchen PARoxetine (PAXIL) 20 MG tablet Take 1 tablet (20 mg total) by mouth daily.  . potassium chloride (KLOR-CON) 10 MEQ tablet Take 1 tablet (10 mEq total) by mouth daily.  . pramipexole (MIRAPEX) 0.5 MG tablet Take 2 tablets (1 mg total) by mouth at bedtime.  . risperidone (RISPERDAL) 2 MG tablet Take 1 tablet (2 mg total) by mouth at bedtime.  . simvastatin (  ZOCOR) 20 MG tablet TAKE 1 TABLET AT BEDTIME  . thiamine 100 MG tablet Take 1 tablet (100 mg total) by mouth daily.  . [DISCONTINUED] busPIRone (BUSPAR) 10 MG tablet TAKE  (1)  TABLET  THREE TIMES DAILY.  . [DISCONTINUED] niacin (NIASPAN) 1000 MG CR tablet TAKE 1 TABLET AT BEDTIME   No facility-administered encounter medications on file as of 07/16/2020.    Review of Systems  Constitutional: Negative for chills and fever.  Eyes: Negative for visual  disturbance.  Respiratory: Negative for shortness of breath and wheezing.   Cardiovascular: Negative for chest pain and leg swelling.  Musculoskeletal: Negative for back pain and gait problem.  Skin: Negative for rash.  Neurological: Negative for dizziness, weakness and numbness.  Psychiatric/Behavioral: Positive for sleep disturbance. Negative for dysphoric mood, self-injury and suicidal ideas. The patient is nervous/anxious.   All other systems reviewed and are negative.   Observations/Objective: Patient sounds comfortable and in no acute distress  Assessment and Plan: Problem List Items Addressed This Visit      Cardiovascular and Mediastinum   Essential hypertension - Primary   Relevant Medications   niacin (NIASPAN) 1000 MG CR tablet     Other   Hyperlipidemia   Relevant Medications   niacin (NIASPAN) 1000 MG CR tablet      Continue current medication.  No changes, will do refills.  Patient will let us know when he has gotten overall the Covid exposure and will come in and do some blood work, will place the orders when they are ready to be able to come in and do them. Follow up plan: Return in about 6 months (around 01/13/2021), or if symptoms worsen or fail to improve, for Hypertension and cholesterol recheck.     I discussed the assessment and treatment plan with the patient. The patient was provided an opportunity to ask questions and all were answered. The patient agreed with the plan and demonstrated an understanding of the instructions.   The patient was advised to call back or seek an in-person evaluation if the symptoms worsen or if the condition fails to improve as anticipated.  The above assessment and management plan was discussed with the patient. The patient verbalized understanding of and has agreed to the management plan. Patient is aware to call the clinic if symptoms persist or worsen. Patient is aware when to return to the clinic for a follow-up visit.  Patient educated on when it is appropriate to go to the emergency department.    I provided 9 minutes of non-face-to-face time during this encounter.    Patrick Pyle, MD

## 2020-07-22 DIAGNOSIS — J449 Chronic obstructive pulmonary disease, unspecified: Secondary | ICD-10-CM | POA: Diagnosis not present

## 2020-07-31 DIAGNOSIS — M6281 Muscle weakness (generalized): Secondary | ICD-10-CM | POA: Diagnosis not present

## 2020-07-31 DIAGNOSIS — R270 Ataxia, unspecified: Secondary | ICD-10-CM | POA: Diagnosis not present

## 2020-08-19 ENCOUNTER — Telehealth: Payer: Self-pay

## 2020-08-19 DIAGNOSIS — J439 Emphysema, unspecified: Secondary | ICD-10-CM

## 2020-08-19 DIAGNOSIS — J449 Chronic obstructive pulmonary disease, unspecified: Secondary | ICD-10-CM | POA: Diagnosis not present

## 2020-08-19 NOTE — Telephone Encounter (Signed)
Left message to return call. What supplies does the pt need?

## 2020-08-19 NOTE — Telephone Encounter (Signed)
Please send Rx for patients medical supplies to [facility]. They still have not received it and Rx was requested via fax in February.

## 2020-08-20 NOTE — Telephone Encounter (Signed)
LMOVM to Wayne Sever, CAP case manager to see if these were supplies that were to be ordered through the CAPS program and if a form should have been received from them

## 2020-08-21 MED ORDER — IPRATROPIUM-ALBUTEROL 0.5-2.5 (3) MG/3ML IN SOLN: 3.0000 mL | Freq: Four times a day (QID) | RESPIRATORY_TRACT | 2 refills | Status: DC

## 2020-08-21 NOTE — Telephone Encounter (Signed)
Call today needing nebulizer medication sent to pharmacy He also needs his supplies, shower chair & other things, these do go through the CAPS program, she will call Tonia his case manager as well Refill sent to [facility]

## 2020-08-22 ENCOUNTER — Other Ambulatory Visit: Payer: Self-pay | Admitting: Family Medicine

## 2020-08-28 DIAGNOSIS — M6281 Muscle weakness (generalized): Secondary | ICD-10-CM | POA: Diagnosis not present

## 2020-08-28 DIAGNOSIS — R270 Ataxia, unspecified: Secondary | ICD-10-CM | POA: Diagnosis not present

## 2020-09-18 ENCOUNTER — Other Ambulatory Visit: Payer: Self-pay | Admitting: Family Medicine

## 2020-09-19 ENCOUNTER — Other Ambulatory Visit: Payer: Self-pay

## 2020-09-19 ENCOUNTER — Encounter: Payer: Self-pay | Admitting: Family Medicine

## 2020-09-19 ENCOUNTER — Ambulatory Visit (INDEPENDENT_AMBULATORY_CARE_PROVIDER_SITE_OTHER): Payer: Medicare HMO | Admitting: Family Medicine

## 2020-09-19 VITALS — BP 102/67 | HR 91 | Ht 67.5 in | Wt 134.0 lb

## 2020-09-19 DIAGNOSIS — K2100 Gastro-esophageal reflux disease with esophagitis, without bleeding: Secondary | ICD-10-CM | POA: Diagnosis not present

## 2020-09-19 DIAGNOSIS — J449 Chronic obstructive pulmonary disease, unspecified: Secondary | ICD-10-CM | POA: Diagnosis not present

## 2020-09-19 DIAGNOSIS — Z23 Encounter for immunization: Secondary | ICD-10-CM

## 2020-09-19 DIAGNOSIS — E782 Mixed hyperlipidemia: Secondary | ICD-10-CM | POA: Diagnosis not present

## 2020-09-19 DIAGNOSIS — J439 Emphysema, unspecified: Secondary | ICD-10-CM | POA: Diagnosis not present

## 2020-09-19 DIAGNOSIS — N3281 Overactive bladder: Secondary | ICD-10-CM | POA: Diagnosis not present

## 2020-09-19 DIAGNOSIS — F339 Major depressive disorder, recurrent, unspecified: Secondary | ICD-10-CM

## 2020-09-19 DIAGNOSIS — R634 Abnormal weight loss: Secondary | ICD-10-CM | POA: Diagnosis not present

## 2020-09-19 DIAGNOSIS — I10 Essential (primary) hypertension: Secondary | ICD-10-CM | POA: Diagnosis not present

## 2020-09-19 DIAGNOSIS — R69 Illness, unspecified: Secondary | ICD-10-CM | POA: Diagnosis not present

## 2020-09-19 DIAGNOSIS — F411 Generalized anxiety disorder: Secondary | ICD-10-CM

## 2020-09-19 MED ORDER — ENSURE COMPLETE PO LIQD: 237.0000 mL | ORAL | 11 refills | Status: DC

## 2020-09-19 MED ORDER — IPRATROPIUM-ALBUTEROL 0.5-2.5 (3) MG/3ML IN SOLN: 3.0000 mL | Freq: Four times a day (QID) | RESPIRATORY_TRACT | 2 refills | Status: DC

## 2020-09-19 MED ORDER — PAROXETINE HCL 20 MG PO TABS: 20.0000 mg | ORAL_TABLET | Freq: Every day | ORAL | 3 refills | Status: DC

## 2020-09-19 MED ORDER — MIRABEGRON ER 25 MG PO TB24: 25.0000 mg | ORAL_TABLET | Freq: Every day | ORAL | 11 refills | Status: DC

## 2020-09-19 MED ORDER — SIMVASTATIN 20 MG PO TABS: 20.0000 mg | ORAL_TABLET | Freq: Every day | ORAL | 3 refills | Status: DC

## 2020-09-19 MED ORDER — BUSPIRONE HCL 10 MG PO TABS: 10.0000 mg | ORAL_TABLET | Freq: Three times a day (TID) | ORAL | 3 refills | Status: DC

## 2020-09-19 MED ORDER — PRAMIPEXOLE DIHYDROCHLORIDE 0.5 MG PO TABS: 1.0000 mg | ORAL_TABLET | Freq: Every day | ORAL | 3 refills | Status: AC

## 2020-09-19 MED ORDER — RISPERIDONE 2 MG PO TABS: 2.0000 mg | ORAL_TABLET | Freq: Every day | ORAL | 3 refills | Status: AC

## 2020-09-19 NOTE — Progress Notes (Signed)
BP 102/67   Pulse 91   Ht 5' 7.5" (1.715 m)   Wt 134 lb (60.8 kg)   SpO2 98%   BMI 20.68 kg/m    Subjective:   Patient ID: [name], male    DOB: 1956-02-09, 65 y.o.   MRN: [id number]  HPI: [name] is a 65 y.o. male presenting on 09/19/2020 for Medical Management of Chronic Issues, Hypertension, and Hyperlipidemia   HPI Failure to thrive and weight loss.  Patient is doing better with his Ensure.  He has had issues with failure to thrive and weight loss.  He would like in the future to do 1 a day.  Hypertension Patient is currently on no medication currently, and their blood pressure today is 102/67. Patient denies any lightheadedness or dizziness. Patient denies headaches, blurred vision, chest pains, shortness of breath, or weakness. Denies any side effects from medication and is content with current medication.   Hyperlipidemia Patient is coming in for recheck of his hyperlipidemia. The patient is currently taking simvastatin. They deny any issues with myalgias or history of liver damage from it. They deny any focal numbness or weakness or chest pain.   GERD Patient is currently on omeprazole.  She denies any major symptoms or abdominal pain or belching or burping. She denies any blood in her stool or lightheadedness or dizziness.   COPD Patient is coming in for COPD recheck today.  He is currently on albuterol and duo nebs.  He has a mild chronic cough but denies any major coughing spells or wheezing spells.  He has 1nighttime symptoms per week and 5daytime symptoms per week currently.  He gets wheezing regularly but says he is stable.  Relevant past medical, surgical, family and social history reviewed and updated as indicated. Interim medical history since our last visit reviewed. Allergies and medications reviewed and updated.  Review of Systems  Constitutional: Positive for appetite change and unexpected weight change. Negative for chills and fever.   Eyes: Negative for discharge.  Respiratory: Negative for shortness of breath and wheezing.   Cardiovascular: Negative for chest pain and leg swelling.  Musculoskeletal: Negative for back pain and gait problem.  Skin: Negative for rash.  Neurological: Negative for dizziness, weakness and light-headedness.  All other systems reviewed and are negative.   Per HPI unless specifically indicated above   Allergies as of 09/19/2020      Reactions   Clonidine Rash      Medication List       Accurate as of September 19, 2020  1:15 PM. If you have any questions, ask your nurse or doctor.        albuterol 108 (90 Base) MCG/ACT inhaler Commonly known as: VENTOLIN HFA 2 PUFFS EVERY 6 HOURS AS NEEDED FOR WHEEZING OR SHORTNESS OF BREATH   busPIRone 10 MG tablet Commonly known as: BUSPAR TAKE ONE TABLET BY MOUTH THREE TIMES DAILY   folic acid 1 MG tablet Commonly known as: FOLVITE Take 1 tablet (1 mg total) by mouth daily.   Gerhardt's butt cream Crea Apply 1 application topically daily. Equal parts nystatin and hydrocortisone and zinc oxide   ipratropium-albuterol 0.5-2.5 (3) MG/3ML Soln Commonly known as: DUONEB Inhale 3 mLs into the lungs 4 (four) times daily.   KP VITAMIN B-12 PO Take 1 tablet by mouth daily.   Melatonin 10 MG Tabs Take 10 mg by mouth at bedtime. What changed: how much to take   metoprolol succinate 25 MG 24 hr  tablet Commonly known as: TOPROL-XL Take 1 tablet (25 mg total) by mouth daily.   mirabegron ER 25 MG Tb24 tablet Commonly known as: Myrbetriq Take 1 tablet (25 mg total) by mouth daily.   mupirocin ointment 2 % Commonly known as: BACTROBAN PLACE 1 APPLICATION INTO THE NOSE 2 TIMES A DAY   niacin 1000 MG CR tablet Commonly known as: NIASPAN Take 1 tablet (1,000 mg total) by mouth at bedtime.   omeprazole 40 MG capsule Commonly known as: PRILOSEC Take 1 capsule (40 mg total) by mouth daily.   PARoxetine 20 MG tablet Commonly known as:  Paxil Take 1 tablet (20 mg total) by mouth daily.   potassium chloride 10 MEQ tablet Commonly known as: KLOR-CON Take 1 tablet (10 mEq total) by mouth daily.   pramipexole 0.5 MG tablet Commonly known as: MIRAPEX Take 2 tablets (1 mg total) by mouth at bedtime.   risperiDONE 2 MG tablet Commonly known as: RISPERDAL Take 1 tablet (2 mg total) by mouth at bedtime.   simvastatin 20 MG tablet Commonly known as: ZOCOR TAKE 1 TABLET AT BEDTIME   thiamine 100 MG tablet Take 1 tablet (100 mg total) by mouth daily.        Objective:   BP 102/67   Pulse 91   Ht 5' 7.5" (1.715 m)   Wt 134 lb (60.8 kg)   SpO2 98%   BMI 20.68 kg/m   Wt Readings from Last 3 Encounters:  09/19/20 134 lb (60.8 kg)  03/05/20 134 lb 3.2 oz (60.9 kg)  02/14/20 136 lb (61.7 kg)    Physical Exam Vitals and nursing note reviewed.  Constitutional:      General: He is not in acute distress.    Appearance: He is well-developed. He is not diaphoretic.  Eyes:     General: No scleral icterus.    Conjunctiva/sclera: Conjunctivae normal.  Neck:     Thyroid: No thyromegaly.  Cardiovascular:     Rate and Rhythm: Normal rate and regular rhythm.     Heart sounds: Normal heart sounds. No murmur heard.   Pulmonary:     Effort: Pulmonary effort is normal. No respiratory distress.     Breath sounds: Wheezing (Small amount of wheezes in both lungs and expiratory) present.  Musculoskeletal:        General: Normal range of motion.     Cervical back: Neck supple.  Lymphadenopathy:     Cervical: No cervical adenopathy.  Skin:    General: Skin is warm and dry.     Findings: No rash.  Neurological:     Mental Status: He is alert and oriented to person, place, and time.     Coordination: Coordination normal.  Psychiatric:        Behavior: Behavior normal.       Assessment & Plan:   Problem List Items Addressed This Visit      Cardiovascular and Mediastinum   Essential hypertension   Relevant  Medications   simvastatin (ZOCOR) 20 MG tablet   Other Relevant Orders   CMP14+EGFR     Respiratory   COPD (chronic obstructive pulmonary disease) (HCC)   Relevant Medications   ipratropium-albuterol (DUONEB) 0.5-2.5 (3) MG/3ML SOLN   Other Relevant Orders   CBC with Differential/Platelet     Digestive   GERD   Relevant Orders   CBC with Differential/Platelet     Other   Hyperlipidemia - Primary   Relevant Medications   simvastatin (ZOCOR) 20 MG tablet  Other Relevant Orders   Lipid panel   Generalized anxiety disorder   Relevant Medications   busPIRone (BUSPAR) 10 MG tablet   risperiDONE (RISPERDAL) 2 MG tablet   pramipexole (MIRAPEX) 0.5 MG tablet   PARoxetine (PAXIL) 20 MG tablet   Depression, recurrent (HCC)   Relevant Medications   busPIRone (BUSPAR) 10 MG tablet   risperiDONE (RISPERDAL) 2 MG tablet   pramipexole (MIRAPEX) 0.5 MG tablet   PARoxetine (PAXIL) 20 MG tablet   Loss of weight   Relevant Medications   feeding supplement, ENSURE COMPLETE, (ENSURE COMPLETE) LIQD    Other Visit Diagnoses    OAB (overactive bladder)       Relevant Medications   mirabegron ER (MYRBETRIQ) 25 MG TB24 tablet      Will give prescription for Ensure to help with his weight loss issues.  Had already sent the Myrbetriq but he says he is not having any more urinary issues and has not been taking the medicine. Follow up plan: Return in about 6 months (around 03/21/2021), or if symptoms worsen or fail to improve, for Depression anxiety and hypertension cholesterol.  Counseling provided for all of the vaccine components No orders of the defined types were placed in this encounter.   Caryl Pina, MD [facility] 09/19/2020, 1:15 PM

## 2020-09-19 NOTE — Addendum Note (Signed)
Addended by: Dorene Sorrow on: 09/19/2020 02:09 PM   Modules accepted: Orders

## 2020-09-20 LAB — LIPID PANEL
Chol/HDL Ratio: 2.1 ratio (ref 0.0–5.0)
Cholesterol, Total: 117 mg/dL (ref 100–199)
HDL: 56 mg/dL (ref >39)
LDL Chol Calc (NIH): 44 mg/dL (ref 0–99)
Triglycerides: 87 mg/dL (ref 0–149)
VLDL Cholesterol Cal: 17 mg/dL (ref 5–40)

## 2020-09-20 LAB — CBC WITH DIFFERENTIAL/PLATELET
Basophils Absolute: 0.1 10*3/uL (ref 0.0–0.2)
Basos: 1 %
EOS (ABSOLUTE): 0.1 10*3/uL (ref 0.0–0.4)
Eos: 2 %
Hematocrit: 38.8 % (ref 37.5–51.0)
Hemoglobin: 12.7 g/dL — ABNORMAL LOW (ref 13.0–17.7)
Immature Grans (Abs): 0 10*3/uL (ref 0.0–0.1)
Immature Granulocytes: 0 %
Lymphocytes Absolute: 2.1 10*3/uL (ref 0.7–3.1)
Lymphs: 25 %
MCH: 32.4 pg (ref 26.6–33.0)
MCHC: 32.7 g/dL (ref 31.5–35.7)
MCV: 99 fL — ABNORMAL HIGH (ref 79–97)
Monocytes Absolute: 0.8 10*3/uL (ref 0.1–0.9)
Monocytes: 9 %
Neutrophils Absolute: 5.5 10*3/uL (ref 1.4–7.0)
Neutrophils: 63 %
Platelets: 235 10*3/uL (ref 150–450)
RBC: 3.92 x10E6/uL — ABNORMAL LOW (ref 4.14–5.80)
RDW: 12.5 % (ref 11.6–15.4)
WBC: 8.6 10*3/uL (ref 3.4–10.8)

## 2020-09-20 LAB — CMP14+EGFR
ALT: 16 IU/L (ref 0–44)
AST: 23 IU/L (ref 0–40)
Albumin/Globulin Ratio: 1.6 (ref 1.2–2.2)
Albumin: 4.1 g/dL (ref 3.8–4.8)
Alkaline Phosphatase: 90 IU/L (ref 44–121)
BUN/Creatinine Ratio: 18 (ref 10–24)
BUN: 14 mg/dL (ref 8–27)
Bilirubin Total: 0.2 mg/dL (ref 0.0–1.2)
CO2: 24 mmol/L (ref 20–29)
Calcium: 9.7 mg/dL (ref 8.6–10.2)
Chloride: 100 mmol/L (ref 96–106)
Creatinine, Ser: 0.8 mg/dL (ref 0.76–1.27)
Globulin, Total: 2.5 g/dL (ref 1.5–4.5)
Glucose: 116 mg/dL — ABNORMAL HIGH (ref 65–99)
Potassium: 4.5 mmol/L (ref 3.5–5.2)
Sodium: 142 mmol/L (ref 134–144)
Total Protein: 6.6 g/dL (ref 6.0–8.5)
eGFR: 98 mL/min/{1.73_m2} (ref >59)

## 2020-09-20 LAB — TSH: TSH: 0.976 u[IU]/mL (ref 0.450–4.500)

## 2020-09-28 DIAGNOSIS — M6281 Muscle weakness (generalized): Secondary | ICD-10-CM | POA: Diagnosis not present

## 2020-09-28 DIAGNOSIS — R270 Ataxia, unspecified: Secondary | ICD-10-CM | POA: Diagnosis not present

## 2020-10-19 ENCOUNTER — Other Ambulatory Visit: Payer: Self-pay | Admitting: Family Medicine

## 2020-10-28 DIAGNOSIS — M6281 Muscle weakness (generalized): Secondary | ICD-10-CM | POA: Diagnosis not present

## 2020-10-28 DIAGNOSIS — R270 Ataxia, unspecified: Secondary | ICD-10-CM | POA: Diagnosis not present

## 2020-11-04 ENCOUNTER — Ambulatory Visit: Payer: Medicare HMO

## 2020-11-05 ENCOUNTER — Ambulatory Visit (INDEPENDENT_AMBULATORY_CARE_PROVIDER_SITE_OTHER): Payer: Medicare HMO

## 2020-11-05 VITALS — Ht 68.0 in | Wt 134.0 lb

## 2020-11-05 DIAGNOSIS — Z0000 Encounter for general adult medical examination without abnormal findings: Secondary | ICD-10-CM | POA: Diagnosis not present

## 2020-11-05 NOTE — Progress Notes (Signed)
Subjective:   JUANDEDIOS DUDASH is a 65 y.o. male who presents for an Initial Medicare Annual Wellness Visit.  Virtual Visit via Telephone Note  I connected with  [name] on 11/05/20 at 10:30 AM EDT by telephone and verified that I am speaking with the correct person using two identifiers.  Location: Patient: Home Provider: [facility] Persons participating in the virtual visit: patient/Nurse Health Advisor   I discussed the limitations, risks, security and privacy concerns of performing an evaluation and management service by telephone and the availability of in person appointments. The patient expressed understanding and agreed to proceed.  Interactive audio and video telecommunications were attempted between this nurse and patient, however failed, due to patient having technical difficulties OR patient did not have access to video capability.  We continued and completed visit with audio only.  Some vital signs may be absent or patient reported.   Kimiye Strathman E Marwah Disbro, LPN   Review of Systems     Cardiac Risk Factors include: advanced age (>15men, >48 women);sedentary lifestyle;diabetes mellitus;dyslipidemia;smoking/ tobacco exposure;Other (see comment);hypertension;male gender, Risk factor comments: COPD, hx of stroke     Objective:    Today's Vitals   11/05/20 0915  Weight: 134 lb (60.8 kg)  Height: 5\' 8"  (1.727 m)   Body mass index is 20.37 kg/m.  Advanced Directives 11/05/2020 12/04/2019 12/03/2019 11/29/2019 07/09/2019 06/06/2017 04/29/2016  Does Patient Have a Medical Advance Directive? Yes No No No Unable to assess, patient is non-responsive or altered mental status No No  Type of Advance Directive Healthcare Power of Attorney - - - - - -  Copy of Healthcare Power of Attorney in Chart? No - copy requested - - - - - -  Would patient like information on creating a medical advance directive? - No - Patient declined - No - Patient declined - - -    Current Medications  (verified) Outpatient Encounter Medications as of 11/05/2020  Medication Sig  . albuterol (VENTOLIN HFA) 108 (90 Base) MCG/ACT inhaler 2 PUFFS EVERY 6 HOURS AS NEEDED FOR WHEEZING OR SHORTNESS OF BREATH  . busPIRone (BUSPAR) 10 MG tablet Take 1 tablet (10 mg total) by mouth 3 (three) times daily.  . Cyanocobalamin (KP VITAMIN B-12 PO) Take 1 tablet by mouth daily.  . feeding supplement, ENSURE COMPLETE, (ENSURE COMPLETE) LIQD Take 237 mLs by mouth daily.  . folic acid (FOLVITE) 1 MG tablet Take 1 tablet (1 mg total) by mouth daily.  11/07/2020 ipratropium-albuterol (DUONEB) 0.5-2.5 (3) MG/3ML SOLN Inhale 3 mLs into the lungs 4 (four) times daily.  . Melatonin 10 MG TABS Take 10 mg by mouth at bedtime. (Patient taking differently: Take 10-20 mg by mouth at bedtime.)  . mirabegron ER (MYRBETRIQ) 25 MG TB24 tablet Take 1 tablet (25 mg total) by mouth daily.  . mupirocin ointment (BACTROBAN) 2 % PLACE 1 APPLICATION INTO THE NOSE 2 TIMES A DAY  . niacin (NIASPAN) 1000 MG CR tablet Take 1 tablet (1,000 mg total) by mouth at bedtime.  Marland Kitchen Nystatin (GERHARDT'S BUTT CREAM) CREA Apply 1 application topically daily. Equal parts nystatin and hydrocortisone and zinc oxide  . omeprazole (PRILOSEC) 40 MG capsule Take 1 capsule (40 mg total) by mouth daily.  Marland Kitchen PARoxetine (PAXIL) 20 MG tablet Take 1 tablet (20 mg total) by mouth daily.  . potassium chloride (KLOR-CON) 10 MEQ tablet Take 1 tablet (10 mEq total) by mouth daily.  . pramipexole (MIRAPEX) 0.5 MG tablet Take 2 tablets (1 mg total) by mouth  at bedtime.  . risperiDONE (RISPERDAL) 2 MG tablet Take 1 tablet (2 mg total) by mouth at bedtime.  . simvastatin (ZOCOR) 20 MG tablet Take 1 tablet (20 mg total) by mouth at bedtime.  . thiamine 100 MG tablet Take 1 tablet (100 mg total) by mouth daily.   No facility-administered encounter medications on file as of 11/05/2020.    Allergies (verified) Clonidine   History: Past Medical History:  Diagnosis Date  .  Anxiety   . Cerebral aneurysm   . Closed head injury   . COPD (chronic obstructive pulmonary disease) (HCC)   . Depression   . GERD (gastroesophageal reflux disease)   . Hyperlipidemia   . Hypertension   . Seizures (HCC)   . Sleep apnea    History reviewed. No pertinent surgical history. Family History  Problem Relation Age of Onset  . Obesity Sister    Social History   Socioeconomic History  . Marital status: Single    Spouse name: Not on file  . Number of children: 1  . Years of education: Not on file  . Highest education level: Not on file  Occupational History  . Not on file  Tobacco Use  . Smoking status: Current Every Day Smoker    Packs/day: 1.00    Years: 50.00    Pack years: 50.00  . Smokeless tobacco: Never Used  Vaping Use  . Vaping Use: Never used  Substance and Sexual Activity  . Alcohol use: No  . Drug use: Yes    Types: Marijuana  . Sexual activity: Not Currently  Other Topics Concern  . Not on file  Social History Narrative   He lives with his sister. He has one son that he doesn't get along with very well.   Patient had a stroke that left him with speech difficulties and high fall risk   Social Determinants of Health   Financial Resource Strain: Low Risk   . Difficulty of Paying Living Expenses: Not hard at all  Food Insecurity: No Food Insecurity  . Worried About Programme researcher, broadcasting/film/video in the Last Year: Never true  . Ran Out of Food in the Last Year: Never true  Transportation Needs: No Transportation Needs  . Lack of Transportation (Medical): No  . Lack of Transportation (Non-Medical): No  Physical Activity: Inactive  . Days of Exercise per Week: 0 days  . Minutes of Exercise per Session: 0 min  Stress: No Stress Concern Present  . Feeling of Stress : Not at all  Social Connections: Socially Isolated  . Frequency of Communication with Friends and Family: Once a week  . Frequency of Social Gatherings with Friends and Family: Once a week  .  Attends Religious Services: Never  . Active Member of Clubs or Organizations: No  . Attends Banker Meetings: Never  . Marital Status: Never married    Tobacco Counseling Ready to quit: Not Answered Counseling given: Not Answered   Clinical Intake:  Pre-visit preparation completed: Yes  Pain : No/denies pain     BMI - recorded: 20.37 Nutritional Status: BMI of 19-24  Normal Nutritional Risks: Unintentional weight loss Diabetes: No  How often do you need to have someone help you when you read instructions, pamphlets, or other written materials from your doctor or pharmacy?: 5 - Always  Diabetic? No  Interpreter Needed?: No  Information entered by :: Eudell Julian, LPN   Activities of Daily Living In your present state of health, do you  have any difficulty performing the following activities: 11/05/2020 12/04/2019  Hearing? N N  Vision? N N  Difficulty concentrating or making decisions? Y N  Walking or climbing stairs? Y N  Dressing or bathing? Y N  Doing errands, shopping? Y N  Preparing Food and eating ? Y -  Using the Toilet? N -  In the past six months, have you accidently leaked urine? Y -  Do you have problems with loss of bowel control? Y -  Comment this is better now -  Managing your Medications? Y -  Managing your Finances? Y -  Housekeeping or managing your Housekeeping? Y -  Some recent data might be hidden    Patient Care Team: Dettinger, Elige RadonJoshua A, MD as PCP - General (Family Medicine)  Indicate any recent Medical Services you may have received from other than [facility] providers in the past year (date may be approximate).     Assessment:   This is a routine wellness examination for [place].  Hearing/Vision screen  Hearing Screening   125Hz  250Hz  500Hz  1000Hz  2000Hz  3000Hz  4000Hz  6000Hz  8000Hz   Right ear:           Left ear:           Comments: Denies hearing difficulties  Vision Screening Comments: Denies vision difficulties - no eye  doctor at this time  Dietary issues and exercise activities discussed: Current Exercise Habits: The patient does not participate in regular exercise at present, Exercise limited by: neurologic condition(s);orthopedic condition(s);respiratory conditions(s)  Goals Addressed            This Visit's Progress   . Prevent falls      . Quit Smoking        Depression Screen PHQ 2/9 Scores 11/05/2020 02/14/2020 11/07/2019 08/09/2019 06/12/2019 05/25/2019 02/02/2019  PHQ - 2 Score 4 0 0 0 0 0 0  PHQ- 9 Score 12 - 11 2 - - -    Fall Risk Fall Risk  11/05/2020 02/14/2020 01/18/2020 11/07/2019 08/09/2019  Falls in the past year? 1 1 1  0 1  Number falls in past yr: 1 1 1  - 0  Injury with Fall? 1 1 1  - 0  Risk for fall due to : History of fall(s);Impaired balance/gait;Medication side effect;Orthopedic patient;Other (Comment) Impaired balance/gait;Impaired mobility Impaired balance/gait;Impaired mobility - -  Risk for fall due to: Comment hx of stroke - - - -  Follow up Education provided;Falls prevention discussed Falls evaluation completed Falls evaluation completed - -    FALL RISK PREVENTION PERTAINING TO THE HOME:  Any stairs in or around the home? No  If so, are there any without handrails? No  Home free of loose throw rugs in walkways, pet beds, electrical cords, etc? Yes  Adequate lighting in your home to reduce risk of falls? Yes   ASSISTIVE DEVICES UTILIZED TO PREVENT FALLS:  Life alert? Yes  but doesn't always wear it Use of a cane, walker or w/c? Yes  doesn't always use it Grab bars in the bathroom? No  Shower chair or bench in shower? No  Elevated toilet seat or a handicapped toilet? No   They have someone coming to price bathroom remodel   TIMED UP AND GO:  Was the test performed? No . Telephonic visit.  Cognitive Function: Cognitive status assessed by direct observation. Patient has current diagnosis of cognitive impairment. Patient is unable to complete screening 6CIT or MMSE.           Immunizations Immunization History  Administered Date(s) Administered  . Influenza,inj,Quad PF,6+ Mos 04/11/2014, 04/16/2015, 05/12/2016, 05/13/2016, 05/11/2017  . Influenza,inj,quad, With Preservative 03/15/2017  . Influenza-Unspecified 03/20/2019  . Pneumococcal Conjugate-13 09/19/2020  . Pneumococcal Polysaccharide-23 07/22/2017, 03/20/2019  . Tdap 10/25/2009, 09/19/2020  . Zoster Recombinat (Shingrix) 08/01/2020    TDAP status: Up to date  Flu Vaccine status: Up to date  Pneumococcal vaccine status: Up to date  Covid-19 vaccine status: Declined, Education has been provided regarding the importance of this vaccine but patient still declined. Advised may receive this vaccine at local pharmacy or Health Dept.or vaccine clinic. Aware to provide a copy of the vaccination record if obtained from local pharmacy or Health Dept. Verbalized acceptance and understanding.  Qualifies for Shingles Vaccine? Yes   Zostavax completed Yes   Shingrix Completed?: Yes  Screening Tests Health Maintenance  Topic Date Due  . COVID-19 Vaccine (1) Never done  . COLONOSCOPY (Pts 45-2yrs Insurance coverage will need to be confirmed)  09/19/2021 (Originally 01/27/1999)  . INFLUENZA VACCINE  01/13/2021  . TETANUS/TDAP  09/20/2030  . Hepatitis C Screening  Completed  . PNA vac Low Risk Adult  Completed  . HPV VACCINES  Aged Out    Health Maintenance  Health Maintenance Due  Topic Date Due  . COVID-19 Vaccine (1) Never done    Colorectal cancer screening: Type of screening: Cologuard. Completed 08/18/2019. Repeat every 3 years  Lung Cancer Screening: (Low Dose CT Chest recommended if Age 3-80 years, 30 pack-year currently smoking OR have quit w/in 15years.) does qualify.   Lung Cancer Screening Referral: They will think about it - had referral before  Additional Screening:  Hepatitis C Screening: does qualify; Completed 01/27/2013  Vision Screening: Recommended annual  ophthalmology exams for early detection of glaucoma and other disorders of the eye. Is the patient up to date with their annual eye exam?  No  Who is the provider or what is the name of the office in which the patient attends annual eye exams? none If pt is not established with a provider, would they like to be referred to a provider to establish care? No .   Dental Screening: Recommended annual dental exams for proper oral hygiene  Community Resource Referral / Chronic Care Management: CRR required this visit?  No   CCM required this visit?  No      Plan:     I have personally reviewed and noted the following in the patient's chart:   . Medical and social history . Use of alcohol, tobacco or illicit drugs  . Current medications and supplements including opioid prescriptions. Patient is not currently taking opioid prescriptions. . Functional ability and status . Nutritional status . Physical activity . Advanced directives . List of other physicians . Hospitalizations, surgeries, and ER visits in previous 12 months . Vitals . Screenings to include cognitive, depression, and falls . Referrals and appointments  In addition, I have reviewed and discussed with patient certain preventive protocols, quality metrics, and best practice recommendations. A written personalized care plan for preventive services as well as general preventive health recommendations were provided to patient.     [place] Constable, LPN   0/12/6224   Nurse Notes: None

## 2020-11-05 NOTE — Patient Instructions (Signed)
[name] , Thank you for taking time to come for your Medicare Wellness Visit. I appreciate your ongoing commitment to your health goals. Please review the following plan we discussed and let me know if I can assist you in the future.   Screening recommendations/referrals: Colonoscopy: Cologuard done 08/18/2019 - Repeat every 3 years Recommended yearly ophthalmology/optometry visit for glaucoma screening and checkup Recommended yearly dental visit for hygiene and checkup  Vaccinations: Influenza vaccine: Done 03/2020 - repeat every fall Pneumococcal vaccine: Done 03/20/2019 & 09/19/2020 Tdap vaccine: Done 09/19/2020 - Repeat in 10 years Shingles vaccine: first dose done 08/01/2020 - due for second dose   Covid-19: Declined  Advanced directives: Please bring a copy of your health care power of attorney and living will to the office to be added to your chart at your convenience.  Conditions/risks identified: Continue fall prevention - try to get handicap accessible bathroom installation. If you wish to quit smoking, help is available. For free tobacco cessation program offerings call the [facility] at 442 499 9648 or Live Well Line at 806-863-0865. You may also visit www.[place].com or email livelifewell@[place] .com for more information on other programs.   You may also call 1-800-QUIT-NOW (317-358-6696) or visit www.[place].ch or www.[facility].org for additional resources on smoking cessation.   Next appointment: Follow up in one year for your annual wellness visit.   Preventive Care 64 Years and Older, Male  Preventive care refers to lifestyle choices and visits with your health care provider that can promote health and wellness. What does preventive care include?  A yearly physical exam. This is also called an annual well check.  Dental exams once or twice a year.  Routine eye exams. Ask your health care provider how often you should have your eyes  checked.  Personal lifestyle choices, including:  Daily care of your teeth and gums.  Regular physical activity.  Eating a healthy diet.  Avoiding tobacco and drug use.  Limiting alcohol use.  Practicing safe sex.  Taking low doses of aspirin every day.  Taking vitamin and mineral supplements as recommended by your health care provider. What happens during an annual well check? The services and screenings done by your health care provider during your annual well check will depend on your age, overall health, lifestyle risk factors, and family history of disease. Counseling  Your health care provider may ask you questions about your:  Alcohol use.  Tobacco use.  Drug use.  Emotional well-being.  Home and relationship well-being.  Sexual activity.  Eating habits.  History of falls.  Memory and ability to understand (cognition).  Work and work Astronomer. Screening  You may have the following tests or measurements:  Height, weight, and BMI.  Blood pressure.  Lipid and cholesterol levels. These may be checked every 5 years, or more frequently if you are over 52 years old.  Skin check.  Lung cancer screening. You may have this screening every year starting at age 21 if you have a 30-pack-year history of smoking and currently smoke or have quit within the past 15 years.  Fecal occult blood test (FOBT) of the stool. You may have this test every year starting at age 37.  Flexible sigmoidoscopy or colonoscopy. You may have a sigmoidoscopy every 5 years or a colonoscopy every 10 years starting at age 28.  Prostate cancer screening. Recommendations will vary depending on your family history and other risks.  Hepatitis C blood test.  Hepatitis B blood test.  Sexually transmitted  disease (STD) testing.  Diabetes screening. This is done by checking your blood sugar (glucose) after you have not eaten for a while (fasting). You may have this done every 1-3  years.  Abdominal aortic aneurysm (AAA) screening. You may need this if you are a current or former smoker.  Osteoporosis. You may be screened starting at age 63 if you are at high risk. Talk with your health care provider about your test results, treatment options, and if necessary, the need for more tests. Vaccines  Your health care provider may recommend certain vaccines, such as:  Influenza vaccine. This is recommended every year.  Tetanus, diphtheria, and acellular pertussis (Tdap, Td) vaccine. You may need a Td booster every 10 years.  Zoster vaccine. You may need this after age 7.  Pneumococcal 13-valent conjugate (PCV13) vaccine. One dose is recommended after age 10.  Pneumococcal polysaccharide (PPSV23) vaccine. One dose is recommended after age 67. Talk to your health care provider about which screenings and vaccines you need and how often you need them. This information is not intended to replace advice given to you by your health care provider. Make sure you discuss any questions you have with your health care provider. Document Released: 06/28/2015 Document Revised: 02/19/2016 Document Reviewed: 04/02/2015 Elsevier Interactive Patient Education  2017 [employer].  Fall Prevention in the Home Falls can cause injuries. They can happen to people of all ages. There are many things you can do to make your home safe and to help prevent falls. What can I do on the outside of my home?  Regularly fix the edges of walkways and driveways and fix any cracks.  Remove anything that might make you trip as you walk through a door, such as a raised step or threshold.  Trim any bushes or trees on the path to your home.  Use bright outdoor lighting.  Clear any walking paths of anything that might make someone trip, such as rocks or tools.  Regularly check to see if handrails are loose or broken. Make sure that both sides of any steps have handrails.  Any raised decks and porches  should have guardrails on the edges.  Have any leaves, snow, or ice cleared regularly.  Use sand or salt on walking paths during winter.  Clean up any spills in your garage right away. This includes oil or grease spills. What can I do in the bathroom?  Use night lights.  Install grab bars by the toilet and in the tub and shower. Do not use towel bars as grab bars.  Use non-skid mats or decals in the tub or shower.  If you need to sit down in the shower, use a plastic, non-slip stool.  Keep the floor dry. Clean up any water that spills on the floor as soon as it happens.  Remove soap buildup in the tub or shower regularly.  Attach bath mats securely with double-sided non-slip rug tape.  Do not have throw rugs and other things on the floor that can make you trip. What can I do in the bedroom?  Use night lights.  Make sure that you have a light by your bed that is easy to reach.  Do not use any sheets or blankets that are too big for your bed. They should not hang down onto the floor.  Have a firm chair that has side arms. You can use this for support while you get dressed.  Do not have throw rugs and other things on  the floor that can make you trip. What can I do in the kitchen?  Clean up any spills right away.  Avoid walking on wet floors.  Keep items that you use a lot in easy-to-reach places.  If you need to reach something above you, use a strong step stool that has a grab bar.  Keep electrical cords out of the way.  Do not use floor polish or wax that makes floors slippery. If you must use wax, use non-skid floor wax.  Do not have throw rugs and other things on the floor that can make you trip. What can I do with my stairs?  Do not leave any items on the stairs.  Make sure that there are handrails on both sides of the stairs and use them. Fix handrails that are broken or loose. Make sure that handrails are as long as the stairways.  Check any carpeting to  make sure that it is firmly attached to the stairs. Fix any carpet that is loose or worn.  Avoid having throw rugs at the top or bottom of the stairs. If you do have throw rugs, attach them to the floor with carpet tape.  Make sure that you have a light switch at the top of the stairs and the bottom of the stairs. If you do not have them, ask someone to add them for you. What else can I do to help prevent falls?  Wear shoes that:  Do not have high heels.  Have rubber bottoms.  Are comfortable and fit you well.  Are closed at the toe. Do not wear sandals.  If you use a stepladder:  Make sure that it is fully opened. Do not climb a closed stepladder.  Make sure that both sides of the stepladder are locked into place.  Ask someone to hold it for you, if possible.  Clearly mark and make sure that you can see:  Any grab bars or handrails.  First and last steps.  Where the edge of each step is.  Use tools that help you move around (mobility aids) if they are needed. These include:  Canes.  Walkers.  Scooters.  Crutches.  Turn on the lights when you go into a dark area. Replace any light bulbs as soon as they burn out.  Set up your furniture so you have a clear path. Avoid moving your furniture around.  If any of your floors are uneven, fix them.  If there are any pets around you, be aware of where they are.  Review your medicines with your doctor. Some medicines can make you feel dizzy. This can increase your chance of falling. Ask your doctor what other things that you can do to help prevent falls. This information is not intended to replace advice given to you by your health care provider. Make sure you discuss any questions you have with your health care provider. Document Released: 03/28/2009 Document Revised: 11/07/2015 Document Reviewed: 07/06/2014 Elsevier Interactive Patient Education  2017 [employer].

## 2020-11-07 ENCOUNTER — Telehealth: Payer: Self-pay

## 2020-11-07 NOTE — Telephone Encounter (Signed)
Attempted to contact [name] about bringing patient in for an appointment. No answer and voicemail was full

## 2020-11-07 NOTE — Telephone Encounter (Signed)
-----   Message from Nils Pyle, MD sent at 11/07/2020  8:17 AM EDT ----- Regarding: FW: RLS Contact: [phone] Please make a visit for him, I cannot change anything with these medicines without discussing it with him because of how many medicines he is on, can be virtual ----- Message ----- From: Arizona Constable, LPN Sent: 7/62/8315   9:32 AM EDT To: Elige Radon Dettinger, MD Subject: RLS                                            His sister says the medicine you gave him for restless legs isn't helping - his legs jerk even during the day and he is up and down all night. Wants to know if they can try something else.

## 2020-11-18 DIAGNOSIS — J449 Chronic obstructive pulmonary disease, unspecified: Secondary | ICD-10-CM | POA: Diagnosis not present

## 2020-11-28 DIAGNOSIS — R270 Ataxia, unspecified: Secondary | ICD-10-CM | POA: Diagnosis not present

## 2020-11-28 DIAGNOSIS — M6281 Muscle weakness (generalized): Secondary | ICD-10-CM | POA: Diagnosis not present

## 2020-12-10 DIAGNOSIS — G4733 Obstructive sleep apnea (adult) (pediatric): Secondary | ICD-10-CM | POA: Diagnosis not present

## 2020-12-10 DIAGNOSIS — I671 Cerebral aneurysm, nonruptured: Secondary | ICD-10-CM | POA: Diagnosis not present

## 2020-12-10 DIAGNOSIS — R32 Unspecified urinary incontinence: Secondary | ICD-10-CM | POA: Diagnosis not present

## 2020-12-23 DIAGNOSIS — J449 Chronic obstructive pulmonary disease, unspecified: Secondary | ICD-10-CM | POA: Diagnosis not present

## 2020-12-28 DIAGNOSIS — M6281 Muscle weakness (generalized): Secondary | ICD-10-CM | POA: Diagnosis not present

## 2020-12-28 DIAGNOSIS — R270 Ataxia, unspecified: Secondary | ICD-10-CM | POA: Diagnosis not present

## 2021-01-17 DIAGNOSIS — J449 Chronic obstructive pulmonary disease, unspecified: Secondary | ICD-10-CM | POA: Diagnosis not present

## 2021-01-24 ENCOUNTER — Emergency Department (HOSPITAL_BASED_OUTPATIENT_CLINIC_OR_DEPARTMENT_OTHER)
Admission: EM | Admit: 2021-01-24 | Discharge: 2021-01-24 | Disposition: A | Discharge status: Home / Self Care | Payer: Medicare HMO | Attending: Emergency Medicine | Admitting: Emergency Medicine

## 2021-01-24 ENCOUNTER — Encounter (HOSPITAL_BASED_OUTPATIENT_CLINIC_OR_DEPARTMENT_OTHER): Payer: Self-pay

## 2021-01-24 ENCOUNTER — Other Ambulatory Visit: Payer: Self-pay

## 2021-01-24 DIAGNOSIS — Z5321 Procedure and treatment not carried out due to patient leaving prior to being seen by health care provider: Secondary | ICD-10-CM | POA: Insufficient documentation

## 2021-01-24 DIAGNOSIS — R2241 Localized swelling, mass and lump, right lower limb: Secondary | ICD-10-CM | POA: Diagnosis not present

## 2021-01-24 NOTE — ED Triage Notes (Addendum)
Per pt's sister pt with redness/swelling to right 2nd toe x 2 months-she states pt recently had a wound that healed to the toe-NAD-limping gait/baseline

## 2021-01-27 DIAGNOSIS — L03031 Cellulitis of right toe: Secondary | ICD-10-CM | POA: Diagnosis not present

## 2021-01-28 DIAGNOSIS — M6281 Muscle weakness (generalized): Secondary | ICD-10-CM | POA: Diagnosis not present

## 2021-01-28 DIAGNOSIS — R270 Ataxia, unspecified: Secondary | ICD-10-CM | POA: Diagnosis not present

## 2021-02-10 ENCOUNTER — Other Ambulatory Visit: Payer: Self-pay | Admitting: Family Medicine

## 2021-02-10 DIAGNOSIS — J439 Emphysema, unspecified: Secondary | ICD-10-CM

## 2021-02-11 DIAGNOSIS — G2581 Restless legs syndrome: Secondary | ICD-10-CM | POA: Diagnosis not present

## 2021-02-11 DIAGNOSIS — J439 Emphysema, unspecified: Secondary | ICD-10-CM | POA: Diagnosis not present

## 2021-02-11 DIAGNOSIS — G629 Polyneuropathy, unspecified: Secondary | ICD-10-CM | POA: Diagnosis not present

## 2021-02-11 DIAGNOSIS — R32 Unspecified urinary incontinence: Secondary | ICD-10-CM | POA: Diagnosis not present

## 2021-02-11 DIAGNOSIS — I69353 Hemiplegia and hemiparesis following cerebral infarction affecting right non-dominant side: Secondary | ICD-10-CM | POA: Diagnosis not present

## 2021-02-11 DIAGNOSIS — E785 Hyperlipidemia, unspecified: Secondary | ICD-10-CM | POA: Diagnosis not present

## 2021-02-11 DIAGNOSIS — R69 Illness, unspecified: Secondary | ICD-10-CM | POA: Diagnosis not present

## 2021-02-11 DIAGNOSIS — G4700 Insomnia, unspecified: Secondary | ICD-10-CM | POA: Diagnosis not present

## 2021-02-11 DIAGNOSIS — K2100 Gastro-esophageal reflux disease with esophagitis, without bleeding: Secondary | ICD-10-CM | POA: Diagnosis not present

## 2021-02-28 ENCOUNTER — Other Ambulatory Visit: Payer: Self-pay

## 2021-02-28 ENCOUNTER — Encounter: Payer: Self-pay | Admitting: Family Medicine

## 2021-02-28 ENCOUNTER — Ambulatory Visit (INDEPENDENT_AMBULATORY_CARE_PROVIDER_SITE_OTHER): Payer: Medicare HMO | Admitting: Family Medicine

## 2021-02-28 VITALS — BP 126/59 | HR 90 | Temp 98.2°F | Ht 67.0 in | Wt 123.1 lb

## 2021-02-28 DIAGNOSIS — L03031 Cellulitis of right toe: Secondary | ICD-10-CM | POA: Diagnosis not present

## 2021-02-28 DIAGNOSIS — R270 Ataxia, unspecified: Secondary | ICD-10-CM | POA: Diagnosis not present

## 2021-02-28 DIAGNOSIS — M6281 Muscle weakness (generalized): Secondary | ICD-10-CM | POA: Diagnosis not present

## 2021-02-28 MED ORDER — DOXYCYCLINE HYCLATE 100 MG PO TABS: 100.0000 mg | ORAL_TABLET | Freq: Two times a day (BID) | ORAL | 0 refills | Status: DC

## 2021-02-28 NOTE — Progress Notes (Signed)
BP (!) 126/59   Pulse 90   Temp 98.2 F (36.8 C) (Temporal)   Ht 5\' 7"  (1.702 m)   Wt 123 lb 2 oz (55.8 kg)   BMI 19.28 kg/m    Subjective:   Patient ID: [name], male    DOB: 22-Jun-1955, 65 y.o.   MRN: [id number]  HPI: [name] is a 65 y.o. male presenting on 02/28/2021 for No chief complaint on file.   HPI Cellulitis of right toe Patient is coming in for follow-up from urgent care for cellulitis on right second toe.  He says he went in there because it was red swollen and puffy and was given an antibiotic but had cellulitis and it had gotten better but now the redness is starting to come back.  He says been off the antibiotic for about a week.  He denies any fevers or chills.  Relevant past medical, surgical, family and social history reviewed and updated as indicated. Interim medical history since our last visit reviewed. Allergies and medications reviewed and updated.  Review of Systems  Constitutional:  Negative for chills and fever.  Respiratory:  Negative for shortness of breath and wheezing.   Cardiovascular:  Negative for chest pain and leg swelling.  Skin:  Positive for color change and rash.  All other systems reviewed and are negative.  Per HPI unless specifically indicated above   Allergies as of 02/28/2021       Reactions   Clonidine Rash        Medication List        Accurate as of February 28, 2021  3:53 PM. If you have any questions, ask your nurse or doctor.          albuterol 108 (90 Base) MCG/ACT inhaler Commonly known as: VENTOLIN HFA 2 PUFFS EVERY 6 HOURS AS NEEDED FOR WHEEZING OR SHORTNESS OF BREATH   busPIRone 10 MG tablet Commonly known as: BUSPAR Take 1 tablet (10 mg total) by mouth 3 (three) times daily.   doxycycline 100 MG tablet Commonly known as: VIBRA-TABS Take 1 tablet (100 mg total) by mouth 2 (two) times daily. 1 po bid Started by: March 02, 2021 Dariella Gillihan, MD   feeding supplement (ENSURE COMPLETE)  Liqd Take 237 mLs by mouth daily.   folic acid 1 MG tablet Commonly known as: FOLVITE Take 1 tablet (1 mg total) by mouth daily.   Gerhardt's butt cream Crea Apply 1 application topically daily. Equal parts nystatin and hydrocortisone and zinc oxide   ipratropium-albuterol 0.5-2.5 (3) MG/3ML Soln Commonly known as: DUONEB INHALE 3 MLS BY NEBULIZER 4 TIMES A DAY   KP VITAMIN B-12 PO Take 1 tablet by mouth daily.   Melatonin 10 MG Tabs Take 10 mg by mouth at bedtime. What changed: how much to take   mirabegron ER 25 MG Tb24 tablet Commonly known as: Myrbetriq Take 1 tablet (25 mg total) by mouth daily.   mupirocin ointment 2 % Commonly known as: BACTROBAN PLACE 1 APPLICATION INTO THE NOSE 2 TIMES A DAY   niacin 1000 MG CR tablet Commonly known as: NIASPAN Take 1 tablet (1,000 mg total) by mouth at bedtime.   omeprazole 40 MG capsule Commonly known as: PRILOSEC Take 1 capsule (40 mg total) by mouth daily.   PARoxetine 20 MG tablet Commonly known as: Paxil Take 1 tablet (20 mg total) by mouth daily.   potassium chloride 10 MEQ tablet Commonly known as: KLOR-CON Take 1 tablet (10 mEq total) by  mouth daily.   pramipexole 0.5 MG tablet Commonly known as: MIRAPEX Take 2 tablets (1 mg total) by mouth at bedtime.   risperiDONE 2 MG tablet Commonly known as: RISPERDAL Take 1 tablet (2 mg total) by mouth at bedtime.   simvastatin 20 MG tablet Commonly known as: ZOCOR Take 1 tablet (20 mg total) by mouth at bedtime.   thiamine 100 MG tablet Take 1 tablet (100 mg total) by mouth daily.         Objective:   BP (!) 126/59   Pulse 90   Temp 98.2 F (36.8 C) (Temporal)   Ht 5\' 7"  (1.702 m)   Wt 123 lb 2 oz (55.8 kg)   BMI 19.28 kg/m   Wt Readings from Last 3 Encounters:  02/28/21 123 lb 2 oz (55.8 kg)  01/24/21 123 lb (55.8 kg)  11/05/20 134 lb (60.8 kg)    Physical Exam Vitals and nursing note reviewed.  Skin:    Findings: Erythema present.      Comments: Erythema and slight swelling and tenderness around right second toe, no induration or fluctuation or drainage.      Assessment & Plan:   Problem List Items Addressed This Visit   None Visit Diagnoses     Cellulitis of right toe    -  Primary       Looks like cellulitis is coming back, gave doxycycline, gave a longer course of it Follow up plan: Return if symptoms worsen or fail to improve.  Counseling provided for all of the vaccine components No orders of the defined types were placed in this encounter.   11/07/20, MD [facility] 02/28/2021, 3:53 PM

## 2021-03-09 ENCOUNTER — Other Ambulatory Visit: Payer: Self-pay

## 2021-03-09 ENCOUNTER — Encounter (HOSPITAL_COMMUNITY): Payer: Self-pay

## 2021-03-09 ENCOUNTER — Emergency Department (HOSPITAL_COMMUNITY): Payer: Medicare HMO

## 2021-03-09 ENCOUNTER — Emergency Department (HOSPITAL_COMMUNITY)
Admission: EM | Admit: 2021-03-09 | Discharge: 2021-03-09 | Disposition: A | Discharge status: Home / Self Care | Payer: Medicare HMO | Attending: Emergency Medicine | Admitting: Emergency Medicine

## 2021-03-09 DIAGNOSIS — I2510 Atherosclerotic heart disease of native coronary artery without angina pectoris: Secondary | ICD-10-CM | POA: Diagnosis not present

## 2021-03-09 DIAGNOSIS — J449 Chronic obstructive pulmonary disease, unspecified: Secondary | ICD-10-CM | POA: Diagnosis not present

## 2021-03-09 DIAGNOSIS — I119 Hypertensive heart disease without heart failure: Secondary | ICD-10-CM | POA: Diagnosis not present

## 2021-03-09 DIAGNOSIS — R69 Illness, unspecified: Secondary | ICD-10-CM | POA: Diagnosis not present

## 2021-03-09 DIAGNOSIS — R531 Weakness: Secondary | ICD-10-CM | POA: Insufficient documentation

## 2021-03-09 DIAGNOSIS — I959 Hypotension, unspecified: Secondary | ICD-10-CM | POA: Diagnosis not present

## 2021-03-09 DIAGNOSIS — F17210 Nicotine dependence, cigarettes, uncomplicated: Secondary | ICD-10-CM | POA: Diagnosis not present

## 2021-03-09 DIAGNOSIS — Z043 Encounter for examination and observation following other accident: Secondary | ICD-10-CM | POA: Diagnosis not present

## 2021-03-09 DIAGNOSIS — I6523 Occlusion and stenosis of bilateral carotid arteries: Secondary | ICD-10-CM | POA: Diagnosis not present

## 2021-03-09 DIAGNOSIS — W19XXXA Unspecified fall, initial encounter: Secondary | ICD-10-CM | POA: Diagnosis not present

## 2021-03-09 LAB — COMPREHENSIVE METABOLIC PANEL
ALT: 18 U/L (ref 0–44)
AST: 29 U/L (ref 15–41)
Albumin: 4.1 g/dL (ref 3.5–5.0)
Alkaline Phosphatase: 73 U/L (ref 38–126)
Anion gap: 9 (ref 5–15)
BUN: 18 mg/dL (ref 8–23)
CO2: 26 mmol/L (ref 22–32)
Calcium: 9.4 mg/dL (ref 8.9–10.3)
Chloride: 101 mmol/L (ref 98–111)
Creatinine, Ser: 0.72 mg/dL (ref 0.61–1.24)
GFR, Estimated: >60 mL/min (ref >60)
Glucose, Bld: 107 mg/dL — ABNORMAL HIGH (ref 70–99)
Potassium: 4.1 mmol/L (ref 3.5–5.1)
Sodium: 136 mmol/L (ref 135–145)
Total Bilirubin: 0.7 mg/dL (ref 0.3–1.2)
Total Protein: 7.2 g/dL (ref 6.5–8.1)

## 2021-03-09 LAB — CBC WITH DIFFERENTIAL/PLATELET
Abs Immature Granulocytes: 0.03 10*3/uL (ref 0.00–0.07)
Basophils Absolute: 0.1 10*3/uL (ref 0.0–0.1)
Basophils Relative: 1 %
Eosinophils Absolute: 0 10*3/uL (ref 0.0–0.5)
Eosinophils Relative: 0 %
HCT: 34.8 % — ABNORMAL LOW (ref 39.0–52.0)
Hemoglobin: 11.6 g/dL — ABNORMAL LOW (ref 13.0–17.0)
Immature Granulocytes: 0 %
Lymphocytes Relative: 13 %
Lymphs Abs: 1.1 10*3/uL (ref 0.7–4.0)
MCH: 33.8 pg (ref 26.0–34.0)
MCHC: 33.3 g/dL (ref 30.0–36.0)
MCV: 101.5 fL — ABNORMAL HIGH (ref 80.0–100.0)
Monocytes Absolute: 0.5 10*3/uL (ref 0.1–1.0)
Monocytes Relative: 6 %
Neutro Abs: 7.1 10*3/uL (ref 1.7–7.7)
Neutrophils Relative %: 80 %
Platelets: 211 10*3/uL (ref 150–400)
RBC: 3.43 MIL/uL — ABNORMAL LOW (ref 4.22–5.81)
RDW: 13.8 % (ref 11.5–15.5)
WBC: 8.9 10*3/uL (ref 4.0–10.5)
nRBC: 0 % (ref 0.0–0.2)

## 2021-03-09 NOTE — Discharge Instructions (Addendum)
Follow-up with your family doctor this week if any problem

## 2021-03-09 NOTE — ED Triage Notes (Signed)
Patient brought in by [facility] for not being able to walk.  States that the patients sister called and said the patient was walking outside working in the yard and he fell x2.  Stated that he couldn't walk anymore.  EMS states that the patient got up and walked to the stretcher with assistance.  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  MD at bedside on EMS arrival.  Ambulated patient at bedside.

## 2021-03-09 NOTE — ED Provider Notes (Signed)
[facility] EMERGENCY DEPARTMENT Provider Note   CSN: [id number] Arrival date & time: 03/09/21  1803     History Chief Complaint  Patient presents with   Weakness    [name] is a 65 y.o. male.  Patient has a history of a stroke that affected his right leg.  He ambulates cautiously.  Patient fell twice today and his sister wanted him to get checked.  Patient states he has no injuries  The history is provided by the patient and medical records. No language interpreter was used.  Weakness Severity:  Moderate Onset quality:  Unable to specify Timing:  Constant Progression:  Waxing and waning Chronicity:  Chronic Context: not alcohol use   Relieved by:  Nothing Worsened by:  Nothing Ineffective treatments:  None tried Associated symptoms: no abdominal pain, no chest pain, no cough, no diarrhea, no frequency, no headaches and no seizures       Past Medical History:  Diagnosis Date   Anxiety    Cerebral aneurysm    Closed head injury    COPD (chronic obstructive pulmonary disease) (HCC)    Depression    GERD (gastroesophageal reflux disease)    Hyperlipidemia    Hypertension    Seizures (HCC)    Sleep apnea     Patient Active Problem List   Diagnosis Date Noted   Dysarthria 02/04/2020   TIA (transient ischemic attack) 02/02/2020   Weakness 12/04/2019   Ataxia 12/03/2019   Hypokalemia 12/03/2019   Macrocytic anemia 12/03/2019   Cellulitis of left thumb 11/29/2019   Mild tetrahydrocannabinol (THC) abuse 08/09/2019   Controlled substance agreement signed 03/14/2018   DDD (degenerative disc disease), cervical 08/24/2017   Arthritis 07/29/2017   Memory loss or impairment 07/08/2016   Loss of weight 07/07/2013   Frequent urination 11/04/2012   Seizure disorder (HCC) 11/04/2012   RLS (restless legs syndrome) 11/04/2012   CAD (coronary artery disease) 11/04/2012   HEAD TRAUMA, CLOSED 04/16/2009   Hyperlipidemia 06/05/2008   Generalized anxiety disorder  06/05/2008   History of alcohol abuse 06/05/2008   Cigarette smoker 06/05/2008   Depression, recurrent (HCC) 06/05/2008   OBSTRUCTIVE SLEEP APNEA 06/05/2008   Essential hypertension 06/05/2008   CEREBRAL ANEURYSM 06/05/2008   COPD (chronic obstructive pulmonary disease) (HCC) 06/05/2008   GERD 06/05/2008   HEADACHE, CHRONIC 06/05/2008    History reviewed. No pertinent surgical history.     Family History  Problem Relation Age of Onset   Obesity Sister     Social History   Tobacco Use   Smoking status: Every Day    Packs/day: 1.00    Years: 50.00    Pack years: 50.00    Types: Cigarettes   Smokeless tobacco: Never  Vaping Use   Vaping Use: Never used  Substance Use Topics   Alcohol use: No   Drug use: Yes    Types: Marijuana    Home Medications Prior to Admission medications   Medication Sig Start Date End Date Taking? Authorizing Provider  albuterol (VENTOLIN HFA) 108 (90 Base) MCG/ACT inhaler 2 PUFFS EVERY 6 HOURS AS NEEDED FOR WHEEZING OR SHORTNESS OF BREATH 11/20/19   Dettinger, Elige Radon, MD  busPIRone (BUSPAR) 10 MG tablet Take 1 tablet (10 mg total) by mouth 3 (three) times daily. 09/19/20   Dettinger, Elige Radon, MD  Cyanocobalamin (KP VITAMIN B-12 PO) Take 1 tablet by mouth daily.    [provider]  doxycycline (VIBRA-TABS) 100 MG tablet Take 1 tablet (100 mg total) by  mouth 2 (two) times daily. 1 po bid 02/28/21   Dettinger, Elige Radon, MD  feeding supplement, ENSURE COMPLETE, (ENSURE COMPLETE) LIQD Take 237 mLs by mouth daily. 09/19/20   Dettinger, Elige Radon, MD  folic acid (FOLVITE) 1 MG tablet Take 1 tablet (1 mg total) by mouth daily. 12/02/19   Emokpae, Courage, MD  ipratropium-albuterol (DUONEB) 0.5-2.5 (3) MG/3ML SOLN INHALE 3 MLS BY NEBULIZER 4 TIMES A DAY 02/10/21   Dettinger, Elige Radon, MD  Melatonin 10 MG TABS Take 10 mg by mouth at bedtime. Patient taking differently: Take 10-20 mg by mouth at bedtime. 08/09/19   Dettinger, Elige Radon, MD  mirabegron ER  (MYRBETRIQ) 25 MG TB24 tablet Take 1 tablet (25 mg total) by mouth daily. 09/19/20   Dettinger, Elige Radon, MD  mupirocin ointment (BACTROBAN) 2 % PLACE 1 APPLICATION INTO THE NOSE 2 TIMES A DAY 03/13/20   Dettinger, Elige Radon, MD  niacin (NIASPAN) 1000 MG CR tablet Take 1 tablet (1,000 mg total) by mouth at bedtime. 07/16/20   Dettinger, Elige Radon, MD  Nystatin (GERHARDT'S BUTT CREAM) CREA Apply 1 application topically daily. Equal parts nystatin and hydrocortisone and zinc oxide 03/15/20   Dettinger, Elige Radon, MD  omeprazole (PRILOSEC) 40 MG capsule Take 1 capsule (40 mg total) by mouth daily. 04/04/20   Dettinger, Elige Radon, MD  PARoxetine (PAXIL) 20 MG tablet Take 1 tablet (20 mg total) by mouth daily. 09/19/20   Dettinger, Elige Radon, MD  potassium chloride (KLOR-CON) 10 MEQ tablet Take 1 tablet (10 mEq total) by mouth daily. 12/03/19   Bethann Berkshire, MD  pramipexole (MIRAPEX) 0.5 MG tablet Take 2 tablets (1 mg total) by mouth at bedtime. 09/19/20   Dettinger, Elige Radon, MD  risperiDONE (RISPERDAL) 2 MG tablet Take 1 tablet (2 mg total) by mouth at bedtime. 09/19/20   Dettinger, Elige Radon, MD  simvastatin (ZOCOR) 20 MG tablet Take 1 tablet (20 mg total) by mouth at bedtime. 09/19/20   Dettinger, Elige Radon, MD  thiamine 100 MG tablet Take 1 tablet (100 mg total) by mouth daily. 12/02/19   Shon Hale, MD    Allergies    Clonidine  Review of Systems   Review of Systems  Constitutional:  Negative for appetite change and fatigue.  HENT:  Negative for congestion, ear discharge and sinus pressure.   Eyes:  Negative for discharge.  Respiratory:  Negative for cough.   Cardiovascular:  Negative for chest pain.  Gastrointestinal:  Negative for abdominal pain and diarrhea.  Genitourinary:  Negative for frequency and hematuria.  Musculoskeletal:  Negative for back pain.  Skin:  Negative for rash.  Neurological:  Positive for weakness. Negative for seizures and headaches.  Psychiatric/Behavioral:  Negative for  hallucinations.    Physical Exam Updated Vital Signs BP (!) 156/64 (BP Location: Left Arm)   Pulse 89   Temp 98.5 F (36.9 C) (Oral)   Resp 18   Ht 5\' 7"  (1.702 m)   Wt 62.6 kg   SpO2 100%   BMI 21.61 kg/m   Physical Exam Vitals and nursing note reviewed.  Constitutional:      Appearance: He is well-developed.  HENT:     Head: Normocephalic.     Nose: Nose normal.     Mouth/Throat:     Mouth: Mucous membranes are moist.  Eyes:     General: No scleral icterus.    Conjunctiva/sclera: Conjunctivae normal.  Neck:     Thyroid: No thyromegaly.  Cardiovascular:  Rate and Rhythm: Normal rate and regular rhythm.     Heart sounds: No murmur heard.   No friction rub. No gallop.  Pulmonary:     Breath sounds: No stridor. No wheezing or rales.  Chest:     Chest wall: No tenderness.  Abdominal:     General: There is no distension.     Tenderness: There is no abdominal tenderness. There is no rebound.  Musculoskeletal:     Cervical back: Neck supple.     Comments: Patient with weakness in right arm and right leg from old stroke.  He can ambulate the way he normally does but he is moderately unsteady  Lymphadenopathy:     Cervical: No cervical adenopathy.  Skin:    Findings: No erythema or rash.  Neurological:     Mental Status: He is alert and oriented to person, place, and time.     Motor: No abnormal muscle tone.     Coordination: Coordination normal.  Psychiatric:        Behavior: Behavior normal.    ED Results / Procedures / Treatments   Labs (all labs ordered are listed, but only abnormal results are displayed) Labs Reviewed  CBC WITH DIFFERENTIAL/PLATELET - Abnormal; Notable for the following components:      Result Value   RBC 3.43 (*)    Hemoglobin 11.6 (*)    HCT 34.8 (*)    MCV 101.5 (*)    All other components within normal limits  COMPREHENSIVE METABOLIC PANEL - Abnormal; Notable for the following components:   Glucose, Bld 107 (*)    All other  components within normal limits    EKG None  Radiology CT HEAD WO CONTRAST ( )  Result Date: 03/09/2021 CLINICAL DATA:  Status post fall.  Cerebral hemorrhage suspected. EXAM: CT HEAD WITHOUT CONTRAST TECHNIQUE: Contiguous axial images were obtained from the base of the skull through the vertex without intravenous contrast. COMPARISON:  CT head 02/03/2020 BRAIN: BRAIN Cerebral ventricle sizes are concordant with the degree of cerebral volume loss. Patchy and confluent areas of decreased attenuation are noted throughout the deep and periventricular white matter of the cerebral hemispheres bilaterally, compatible with chronic microvascular ischemic disease. No evidence of large-territorial acute infarction. No parenchymal hemorrhage. No mass lesion. No extra-axial collection. No mass effect or midline shift. No hydrocephalus. Basilar cisterns are patent. Vascular: No hyperdense vessel. Atherosclerotic calcifications are present within the cavernous internal carotid arteries. Skull: No acute fracture or focal lesion. Sinuses/Orbits: Almost complete opacification of the visualized portions of the left maxillary sinus. Otherwise visualized paranasal sinuses and mastoid air cells are clear. The orbits are unremarkable. Other: None. IMPRESSION: No acute intracranial abnormality. Electronically Signed   By: Tish Frederickson M.D.   On: 03/09/2021 18:56    Procedures Procedures   Medications Ordered in ED Medications - No data to display  ED Course  I have reviewed the triage vital signs and the nursing notes.  Pertinent labs & imaging results that were available during my care of the patient were reviewed by me and considered in my medical decision making (see chart for details).    MDM Rules/Calculators/A&P                           Patient with a fall no injuries.  CT of head and labs unremarkable Final Clinical Impression(s) / ED Diagnoses Final diagnoses:  Weakness    Rx / DC Orders ED  Discharge Orders  None        Bethann Berkshire, MD 03/10/21 1103

## 2021-03-11 ENCOUNTER — Other Ambulatory Visit: Payer: Self-pay | Admitting: Family Medicine

## 2021-03-21 ENCOUNTER — Encounter: Payer: Self-pay | Admitting: Family Medicine

## 2021-03-21 ENCOUNTER — Ambulatory Visit (INDEPENDENT_AMBULATORY_CARE_PROVIDER_SITE_OTHER): Payer: Medicare HMO | Admitting: Family Medicine

## 2021-03-21 ENCOUNTER — Other Ambulatory Visit: Payer: Self-pay

## 2021-03-21 VITALS — BP 103/62 | HR 71 | Temp 97.6°F | Ht 67.0 in | Wt 121.2 lb

## 2021-03-21 DIAGNOSIS — L03031 Cellulitis of right toe: Secondary | ICD-10-CM

## 2021-03-21 DIAGNOSIS — L0390 Cellulitis, unspecified: Secondary | ICD-10-CM

## 2021-03-21 DIAGNOSIS — I10 Essential (primary) hypertension: Secondary | ICD-10-CM

## 2021-03-21 DIAGNOSIS — E782 Mixed hyperlipidemia: Secondary | ICD-10-CM

## 2021-03-21 DIAGNOSIS — F339 Major depressive disorder, recurrent, unspecified: Secondary | ICD-10-CM | POA: Diagnosis not present

## 2021-03-21 DIAGNOSIS — F411 Generalized anxiety disorder: Secondary | ICD-10-CM

## 2021-03-21 DIAGNOSIS — R69 Illness, unspecified: Secondary | ICD-10-CM | POA: Diagnosis not present

## 2021-03-21 MED ORDER — DOXYCYCLINE HYCLATE 100 MG PO TABS: 100.0000 mg | ORAL_TABLET | Freq: Two times a day (BID) | ORAL | 0 refills | Status: DC

## 2021-03-21 NOTE — Progress Notes (Signed)
BP 103/62   Pulse 71   Temp 97.6 F (36.4 C)   Ht $R'5\' 7"'EY$  (1.702 m)   Wt 121 lb 3.2 oz (55 kg)   SpO2 99%   BMI 18.98 kg/m    Subjective:   Patient ID: [name], male    DOB: 1956/05/15, 65 y.o.   MRN: [id number]  HPI: [name] is a 65 y.o. male presenting on 03/21/2021 for Medical Management of Chronic Issues   HPI Hyperlipidemia Patient is coming in for recheck of his hyperlipidemia. The patient is currently taking niacin and simvastatin. They deny any issues with myalgias or history of liver damage from it. They deny any focal numbness or weakness or chest pain.   Hypertension Patient is currently on no medicine currently, and their blood pressure today is 103/62. Patient denies any lightheadedness or dizziness. Patient denies headaches, blurred vision, chest pains, shortness of breath, or weakness. Denies any side effects from medication and is content with current medication.   Patient has still having urinary frequency and nocturia.  They do not feel like intervention helped and will try off of it and see if it makes a difference.  They feel like it might of gotten worse since he has been on it.  Patient is still having issues with his right big toe and second toe between the 2 toes, he has wound on the center of the second toe between the 2 and some erythema on the great toe between the 2 toes.  He has taken a couple rounds of antibiotics and that we will look a little bit better but has not gotten completely better.  They are no longer able to cut his toenails.  Relevant past medical, surgical, family and social history reviewed and updated as indicated. Interim medical history since our last visit reviewed. Allergies and medications reviewed and updated.  Review of Systems  Constitutional:  Negative for chills and fever.  Eyes:  Negative for visual disturbance.  Respiratory:  Negative for shortness of breath and wheezing.   Cardiovascular:  Negative  for chest pain and leg swelling.  Musculoskeletal:  Negative for back pain and gait problem.  Skin:  Negative for rash.  Neurological:  Negative for dizziness, weakness and light-headedness.  All other systems reviewed and are negative.  Per HPI unless specifically indicated above   Allergies as of 03/21/2021       Reactions   Clonidine Rash        Medication List        Accurate as of March 21, 2021 11:42 AM. If you have any questions, ask your nurse or doctor.          albuterol 108 (90 Base) MCG/ACT inhaler Commonly known as: VENTOLIN HFA 2 PUFFS EVERY 6 HOURS AS NEEDED FOR WHEEZING OR SHORTNESS OF BREATH   busPIRone 10 MG tablet Commonly known as: BUSPAR Take 1 tablet (10 mg total) by mouth 3 (three) times daily.   doxycycline 100 MG tablet Commonly known as: VIBRA-TABS Take 1 tablet (100 mg total) by mouth 2 (two) times daily. 1 po bid   feeding supplement (ENSURE COMPLETE) Liqd Take 237 mLs by mouth daily.   folic acid 1 MG tablet Commonly known as: FOLVITE Take 1 tablet (1 mg total) by mouth daily.   Gerhardt's butt cream Crea Apply 1 application topically daily. Equal parts nystatin and hydrocortisone and zinc oxide   ipratropium-albuterol 0.5-2.5 (3) MG/3ML Soln Commonly known as: DUONEB INHALE 3 MLS  BY NEBULIZER 4 TIMES A DAY   KP VITAMIN B-12 PO Take 1 tablet by mouth daily.   Melatonin 10 MG Tabs Take 10 mg by mouth at bedtime. What changed: how much to take   mirabegron ER 25 MG Tb24 tablet Commonly known as: Myrbetriq Take 1 tablet (25 mg total) by mouth daily.   mupirocin ointment 2 % Commonly known as: BACTROBAN PLACE 1 APPLICATION INTO THE NOSE 2 TIMES A DAY   niacin 1000 MG CR tablet Commonly known as: NIASPAN Take 1 tablet (1,000 mg total) by mouth at bedtime.   omeprazole 40 MG capsule Commonly known as: PRILOSEC TAKE (1) CAPSULE DAILY   PARoxetine 20 MG tablet Commonly known as: Paxil Take 1 tablet (20 mg total) by  mouth daily.   potassium chloride 10 MEQ tablet Commonly known as: KLOR-CON Take 1 tablet (10 mEq total) by mouth daily.   pramipexole 0.5 MG tablet Commonly known as: MIRAPEX Take 2 tablets (1 mg total) by mouth at bedtime.   risperiDONE 2 MG tablet Commonly known as: RISPERDAL Take 1 tablet (2 mg total) by mouth at bedtime.   simvastatin 20 MG tablet Commonly known as: ZOCOR Take 1 tablet (20 mg total) by mouth at bedtime.   thiamine 100 MG tablet Take 1 tablet (100 mg total) by mouth daily.         Objective:   BP 103/62   Pulse 71   Temp 97.6 F (36.4 C)   Ht $R'5\' 7"'Ff$  (1.702 m)   Wt 121 lb 3.2 oz (55 kg)   SpO2 99%   BMI 18.98 kg/m   Wt Readings from Last 3 Encounters:  03/21/21 121 lb 3.2 oz (55 kg)  03/09/21 138 lb (62.6 kg)  02/28/21 123 lb 2 oz (55.8 kg)    Physical Exam Vitals and nursing note reviewed.  Constitutional:      General: He is not in acute distress.    Appearance: He is well-developed. He is not diaphoretic.  Eyes:     General: No scleral icterus.    Conjunctiva/sclera: Conjunctivae normal.  Neck:     Thyroid: No thyromegaly.  Cardiovascular:     Rate and Rhythm: Normal rate and regular rhythm.     Heart sounds: Normal heart sounds. No murmur heard. Pulmonary:     Effort: Pulmonary effort is normal. No respiratory distress.     Breath sounds: Normal breath sounds. No wheezing.  Musculoskeletal:        General: No swelling. Normal range of motion.     Cervical back: Neck supple.  Lymphadenopathy:     Cervical: No cervical adenopathy.  Skin:    General: Skin is warm and dry.     Findings: No rash.  Neurological:     Mental Status: He is alert and oriented to person, place, and time.     Coordination: Coordination normal.  Psychiatric:        Behavior: Behavior normal.      Assessment & Plan:   Problem List Items Addressed This Visit       Cardiovascular and Mediastinum   Essential hypertension     Other    Hyperlipidemia - Primary   Relevant Orders   CMP14+EGFR   Lipid panel   Generalized anxiety disorder   Relevant Orders   CBC with Differential/Platelet   Depression, recurrent ([place])   Other Visit Diagnoses     Cellulitis of right toe       Relevant Medications   doxycycline (  VIBRA-TABS) 100 MG tablet   Other Relevant Orders   Ambulatory referral to Podiatry   Wound cellulitis       Relevant Orders   Ambulatory referral to Podiatry       Will refer to podiatry, refill antibiotic Follow up plan: Return in about 3 months (around 06/21/2021), or if symptoms worsen or fail to improve, for Hypertension and hyperlipidemia.  Counseling provided for all of the vaccine components Orders Placed This Encounter  Procedures   CBC with Differential/Platelet   CMP14+EGFR   Lipid panel   Ambulatory referral to [place], MD [facility] 03/21/2021, 11:42 AM

## 2021-03-22 LAB — CBC WITH DIFFERENTIAL/PLATELET
Basophils Absolute: 0.1 10*3/uL (ref 0.0–0.2)
Basos: 1 %
EOS (ABSOLUTE): 0.1 10*3/uL (ref 0.0–0.4)
Eos: 1 %
Hematocrit: 34.7 % — ABNORMAL LOW (ref 37.5–51.0)
Hemoglobin: 11.8 g/dL — ABNORMAL LOW (ref 13.0–17.7)
Immature Grans (Abs): 0 10*3/uL (ref 0.0–0.1)
Immature Granulocytes: 0 %
Lymphocytes Absolute: 1.4 10*3/uL (ref 0.7–3.1)
Lymphs: 15 %
MCH: 32 pg (ref 26.6–33.0)
MCHC: 34 g/dL (ref 31.5–35.7)
MCV: 94 fL (ref 79–97)
Monocytes Absolute: 0.7 10*3/uL (ref 0.1–0.9)
Monocytes: 8 %
Neutrophils Absolute: 6.8 10*3/uL (ref 1.4–7.0)
Neutrophils: 75 %
Platelets: 223 10*3/uL (ref 150–450)
RBC: 3.69 x10E6/uL — ABNORMAL LOW (ref 4.14–5.80)
RDW: 11.9 % (ref 11.6–15.4)
WBC: 9 10*3/uL (ref 3.4–10.8)

## 2021-03-22 LAB — LIPID PANEL
Chol/HDL Ratio: 1.9 ratio (ref 0.0–5.0)
Cholesterol, Total: 124 mg/dL (ref 100–199)
HDL: 67 mg/dL (ref >39)
LDL Chol Calc (NIH): 46 mg/dL (ref 0–99)
Triglycerides: 46 mg/dL (ref 0–149)
VLDL Cholesterol Cal: 11 mg/dL (ref 5–40)

## 2021-03-22 LAB — CMP14+EGFR
ALT: 14 IU/L (ref 0–44)
AST: 22 IU/L (ref 0–40)
Albumin/Globulin Ratio: 2.4 — ABNORMAL HIGH (ref 1.2–2.2)
Albumin: 4.3 g/dL (ref 3.8–4.8)
Alkaline Phosphatase: 83 IU/L (ref 44–121)
BUN/Creatinine Ratio: 25 — ABNORMAL HIGH (ref 10–24)
BUN: 16 mg/dL (ref 8–27)
Bilirubin Total: 0.3 mg/dL (ref 0.0–1.2)
CO2: 21 mmol/L (ref 20–29)
Calcium: 9.2 mg/dL (ref 8.6–10.2)
Chloride: 101 mmol/L (ref 96–106)
Creatinine, Ser: 0.64 mg/dL — ABNORMAL LOW (ref 0.76–1.27)
Globulin, Total: 1.8 g/dL (ref 1.5–4.5)
Glucose: 108 mg/dL — ABNORMAL HIGH (ref 70–99)
Potassium: 4.3 mmol/L (ref 3.5–5.2)
Sodium: 137 mmol/L (ref 134–144)
Total Protein: 6.1 g/dL (ref 6.0–8.5)
eGFR: 104 mL/min/{1.73_m2} (ref >59)

## 2021-03-30 DIAGNOSIS — M6281 Muscle weakness (generalized): Secondary | ICD-10-CM | POA: Diagnosis not present

## 2021-03-30 DIAGNOSIS — R270 Ataxia, unspecified: Secondary | ICD-10-CM | POA: Diagnosis not present

## 2021-04-02 ENCOUNTER — Telehealth: Payer: Self-pay | Admitting: Family Medicine

## 2021-04-02 DIAGNOSIS — L97509 Non-pressure chronic ulcer of other part of unspecified foot with unspecified severity: Secondary | ICD-10-CM

## 2021-04-02 DIAGNOSIS — E11621 Type 2 diabetes mellitus with foot ulcer: Secondary | ICD-10-CM

## 2021-04-02 NOTE — Telephone Encounter (Signed)
REFERRAL REQUEST Telephone Note  Have you been seen at our office for this problem? yes (Advise that they may need an appointment with their PCP before a referral can be done)  Reason for Referral: wound on foot not heeling-leaking? Referral discussed with patient: yes Best contact number of patient for referral team:   #[phone] Has patient been seen by a specialist for this issue before: no Patient provider preference for referral: Dr Ulice Brilliant Patient location preference for referral:   [facility] Patient notified that referrals can take up to a week or longer to process. If they haven't heard anything within a week they should call back and speak with the referral department.    Dettinger's pt.  Please call ASAP bc she has been waiting Oct 7th.

## 2021-04-10 ENCOUNTER — Other Ambulatory Visit: Payer: Self-pay | Admitting: Family Medicine

## 2021-04-10 DIAGNOSIS — J439 Emphysema, unspecified: Secondary | ICD-10-CM

## 2021-04-14 ENCOUNTER — Telehealth: Payer: Self-pay | Admitting: Family Medicine

## 2021-04-14 NOTE — Telephone Encounter (Signed)
Have him double it and take 2 at the same time, do that for 2 weeks and let me know

## 2021-04-14 NOTE — Telephone Encounter (Signed)
Azzie aware of recommendation.

## 2021-04-17 ENCOUNTER — Encounter: Payer: Self-pay | Admitting: *Deleted

## 2021-04-17 ENCOUNTER — Telehealth: Payer: Self-pay | Admitting: Family Medicine

## 2021-04-17 DIAGNOSIS — M86171 Other acute osteomyelitis, right ankle and foot: Secondary | ICD-10-CM | POA: Diagnosis not present

## 2021-04-17 DIAGNOSIS — N3281 Overactive bladder: Secondary | ICD-10-CM

## 2021-04-17 DIAGNOSIS — M79674 Pain in right toe(s): Secondary | ICD-10-CM | POA: Diagnosis not present

## 2021-04-17 NOTE — Telephone Encounter (Signed)
REFERRAL REQUEST Telephone Note  Have you been seen at our office for this problem? yes (Advise that they may need an appointment with their PCP before a referral can be done)  Reason for Referral: pt is peeing thru pad and getting all over furniture and floors.  Referral discussed with patient: yes  Best contact number of patient for referral team: [phone] Has patient been seen by a specialist for this issue before: no  Patient provider preference for referral: urologist Patient location preference for referral: na   Patient notified that referrals can take up to a week or longer to process. If they haven't heard anything within a week they should call back and speak with the referral department.

## 2021-04-17 NOTE — Telephone Encounter (Signed)
Placed referral  

## 2021-04-17 NOTE — Telephone Encounter (Signed)
Med list faxed to Dr. Ulice Brilliant

## 2021-05-20 DIAGNOSIS — M79674 Pain in right toe(s): Secondary | ICD-10-CM | POA: Diagnosis not present

## 2021-05-20 DIAGNOSIS — M86171 Other acute osteomyelitis, right ankle and foot: Secondary | ICD-10-CM | POA: Diagnosis not present

## 2021-06-02 ENCOUNTER — Other Ambulatory Visit: Payer: Self-pay | Admitting: Family Medicine

## 2021-06-02 DIAGNOSIS — J439 Emphysema, unspecified: Secondary | ICD-10-CM

## 2021-06-09 NOTE — Progress Notes (Incomplete)
H&P  Chief Complaint: Urinary difficulty  History of Present Illness: ***  Past Medical History:  Diagnosis Date   Anxiety    Cerebral aneurysm    Closed head injury    COPD (chronic obstructive pulmonary disease) (HCC)    Depression    GERD (gastroesophageal reflux disease)    Hyperlipidemia    Hypertension    Seizures (HCC)    Sleep apnea     No past surgical history on file.  Home Medications:  Allergies as of 06/10/2021       Reactions   Clonidine Rash        Medication List        Accurate as of June 09, 2021  7:53 PM. If you have any questions, ask your nurse or doctor.          albuterol 108 (90 Base) MCG/ACT inhaler Commonly known as: VENTOLIN HFA 2 PUFFS EVERY 6 HOURS AS NEEDED FOR WHEEZING OR SHORTNESS OF BREATH   busPIRone 10 MG tablet Commonly known as: BUSPAR Take 1 tablet (10 mg total) by mouth 3 (three) times daily.   doxycycline 100 MG tablet Commonly known as: VIBRA-TABS Take 1 tablet (100 mg total) by mouth 2 (two) times daily. 1 po bid   feeding supplement (ENSURE COMPLETE) Liqd Take 237 mLs by mouth daily.   folic acid 1 MG tablet Commonly known as: FOLVITE Take 1 tablet (1 mg total) by mouth daily.   Gerhardt's butt cream Crea Apply 1 application topically daily. Equal parts nystatin and hydrocortisone and zinc oxide   ipratropium-albuterol 0.5-2.5 (3) MG/3ML Soln Commonly known as: DUONEB INHALE 3 MLS BY NEBULIZER 4 TIMES A DAY   KP VITAMIN B-12 PO Take 1 tablet by mouth daily.   Melatonin 10 MG Tabs Take 10 mg by mouth at bedtime. What changed: how much to take   mirabegron ER 25 MG Tb24 tablet Commonly known as: Myrbetriq Take 1 tablet (25 mg total) by mouth daily.   mupirocin ointment 2 % Commonly known as: BACTROBAN PLACE 1 APPLICATION INTO THE NOSE 2 TIMES A DAY   niacin 1000 MG CR tablet Commonly known as: NIASPAN Take 1 tablet (1,000 mg total) by mouth at bedtime.   omeprazole 40 MG  capsule Commonly known as: PRILOSEC TAKE (1) CAPSULE DAILY   PARoxetine 20 MG tablet Commonly known as: Paxil Take 1 tablet (20 mg total) by mouth daily.   potassium chloride 10 MEQ tablet Commonly known as: KLOR-CON Take 1 tablet (10 mEq total) by mouth daily.   pramipexole 0.5 MG tablet Commonly known as: MIRAPEX Take 2 tablets (1 mg total) by mouth at bedtime.   risperiDONE 2 MG tablet Commonly known as: RISPERDAL Take 1 tablet (2 mg total) by mouth at bedtime.   simvastatin 20 MG tablet Commonly known as: ZOCOR Take 1 tablet (20 mg total) by mouth at bedtime.   thiamine 100 MG tablet Take 1 tablet (100 mg total) by mouth daily.        Allergies:  Allergies  Allergen Reactions   Clonidine Rash    Family History  Problem Relation Age of Onset   Obesity Sister     Social History:  reports that he has been smoking cigarettes. He has a 50.00 pack-year smoking history. He has never used smokeless tobacco. He reports current drug use. Drug: Marijuana. He reports that he does not drink alcohol.  ROS: A complete review of systems was performed.  All systems are negative except for pertinent  findings as noted.  Physical Exam:  Vital signs in last 24 hours: There were no vitals taken for this visit. Constitutional:  Alert and oriented, No acute distress Cardiovascular: Regular rate  Respiratory: Normal respiratory effort GI: Abdomen is soft, nontender, nondistended, no abdominal masses. No CVAT.  Genitourinary: Normal male phallus, testes are descended bilaterally and non-tender and without masses, scrotum is normal in appearance without lesions or masses, perineum is normal on inspection. Lymphatic: No lymphadenopathy Neurologic: Grossly intact, no focal deficits Psychiatric: Normal mood and affect  I have reviewed prior pt notes  I have reviewed notes from referring/previous physicians  I have reviewed urinalysis results  I have independently reviewed prior  imaging  I have reviewed prior PSA results  I have reviewed prior urine culture   Impression/Assessment:  ***  Plan:  ***

## 2021-06-10 ENCOUNTER — Ambulatory Visit: Payer: Medicare HMO | Admitting: Urology

## 2021-07-01 ENCOUNTER — Other Ambulatory Visit: Payer: Self-pay | Admitting: Family Medicine

## 2021-07-01 DIAGNOSIS — J439 Emphysema, unspecified: Secondary | ICD-10-CM

## 2021-07-09 ENCOUNTER — Encounter: Payer: Self-pay | Admitting: Family Medicine

## 2021-07-09 ENCOUNTER — Telehealth (INDEPENDENT_AMBULATORY_CARE_PROVIDER_SITE_OTHER): Payer: Medicare Other | Admitting: Family Medicine

## 2021-07-09 DIAGNOSIS — F339 Major depressive disorder, recurrent, unspecified: Secondary | ICD-10-CM

## 2021-07-09 DIAGNOSIS — I999 Unspecified disorder of circulatory system: Secondary | ICD-10-CM

## 2021-07-09 DIAGNOSIS — E782 Mixed hyperlipidemia: Secondary | ICD-10-CM

## 2021-07-09 DIAGNOSIS — I10 Essential (primary) hypertension: Secondary | ICD-10-CM | POA: Diagnosis not present

## 2021-07-09 DIAGNOSIS — F411 Generalized anxiety disorder: Secondary | ICD-10-CM

## 2021-07-09 DIAGNOSIS — L03031 Cellulitis of right toe: Secondary | ICD-10-CM

## 2021-07-09 DIAGNOSIS — J439 Emphysema, unspecified: Secondary | ICD-10-CM

## 2021-07-09 NOTE — Progress Notes (Signed)
Virtual Visit via telephone Note  I connected with [name] on 07/09/21 at 1115 by telephone and verified that I am speaking with the correct person using two identifiers. [name] is currently located at home and family are currently with her during visit. The provider, Fransisca Kaufmann Dalayah Deahl, MD is located in their office at time of visit.  Call ended at 1126  I discussed the limitations, risks, security and privacy concerns of performing an evaluation and management service by telephone and the availability of in person appointments. I also discussed with the patient that there may be a patient responsible charge related to this service. The patient expressed understanding and agreed to proceed.   History and Present Illness: Hyperlipidemia Patient is coming in for recheck of his hyperlipidemia. The patient is currently taking simvastatin. They deny any issues with myalgias or history of liver damage from it. They deny any focal numbness or weakness or chest pain.   Hypertension Patient is currently on no medication, and their blood pressure today is 130/73. Patient denies any lightheadedness or dizziness. Patient denies headaches, blurred vision, chest pains, shortness of breath, or weakness. Denies any side effects from medication and is content with current medication.   COPD Patient is coming in for COPD recheck today.  He is currently on duoneb.  He has a mild chronic cough but denies any major coughing spells or wheezing spells.  He has 2nighttime symptoms per week and 5daytime symptoms per week currently. Patient is still smoking.  His buspar is helping anxiety and seems to help a little  and she feels like he is doing ok with it.   I do not usually sending as an anxiety once because in a long time after repeat there  1. Mixed hyperlipidemia   2. Depression, recurrent ([place])   3. Generalized anxiety disorder   4. Essential hypertension   5. Pulmonary  emphysema, unspecified emphysema type ([place])   6. Cellulitis of right toe   7. Decreased circulation     Outpatient Encounter Medications as of 07/09/2021  Medication Sig   albuterol (VENTOLIN HFA) 108 (90 Base) MCG/ACT inhaler 2 PUFFS EVERY 6 HOURS AS NEEDED FOR WHEEZING OR SHORTNESS OF BREATH   busPIRone (BUSPAR) 10 MG tablet Take 1 tablet (10 mg total) by mouth 3 (three) times daily.   Cyanocobalamin (KP VITAMIN B-12 PO) Take 1 tablet by mouth daily.   doxycycline (VIBRA-TABS) 100 MG tablet Take 1 tablet (100 mg total) by mouth 2 (two) times daily. 1 po bid   feeding supplement, ENSURE COMPLETE, (ENSURE COMPLETE) LIQD Take 237 mLs by mouth daily.   folic acid (FOLVITE) 1 MG tablet Take 1 tablet (1 mg total) by mouth daily.   ipratropium-albuterol (DUONEB) 0.5-2.5 (3) MG/3ML SOLN INHALE 3 MLS BY NEBULIZER 4 TIMES A DAY   Melatonin 10 MG TABS Take 10 mg by mouth at bedtime. (Patient taking differently: Take 10-20 mg by mouth at bedtime.)   mirabegron ER (MYRBETRIQ) 25 MG TB24 tablet Take 1 tablet (25 mg total) by mouth daily.   mupirocin ointment (BACTROBAN) 2 % PLACE 1 APPLICATION INTO THE NOSE 2 TIMES A DAY   niacin (NIASPAN) 1000 MG CR tablet Take 1 tablet (1,000 mg total) by mouth at bedtime.   Nystatin (GERHARDT'S BUTT CREAM) CREA Apply 1 application topically daily. Equal parts nystatin and hydrocortisone and zinc oxide   omeprazole (PRILOSEC) 40 MG capsule TAKE (1) CAPSULE DAILY   PARoxetine (PAXIL) 20 MG tablet  Take 1 tablet (20 mg total) by mouth daily.   potassium chloride (KLOR-CON) 10 MEQ tablet Take 1 tablet (10 mEq total) by mouth daily.   pramipexole (MIRAPEX) 0.5 MG tablet Take 2 tablets (1 mg total) by mouth at bedtime.   risperiDONE (RISPERDAL) 2 MG tablet Take 1 tablet (2 mg total) by mouth at bedtime.   simvastatin (ZOCOR) 20 MG tablet Take 1 tablet (20 mg total) by mouth at bedtime.   thiamine 100 MG tablet Take 1 tablet (100 mg total) by mouth daily.   No  facility-administered encounter medications on file as of 07/09/2021.    Review of Systems  Constitutional:  Negative for chills and fever.  Eyes:  Negative for visual disturbance.  Respiratory:  Negative for shortness of breath and wheezing.   Cardiovascular:  Negative for chest pain and leg swelling.  Musculoskeletal:  Negative for back pain and gait problem.  Skin:  Negative for rash.  Neurological:  Negative for dizziness, weakness and light-headedness.  All other systems reviewed and are negative.  Observations/Objective: Patient sounds comfortable and in no acute distress  Assessment and Plan: Problem List Items Addressed This Visit       Cardiovascular and Mediastinum   Essential hypertension   Relevant Orders   CMP14+EGFR   Lipid panel     Respiratory   COPD (chronic obstructive pulmonary disease) ([place])     Other   Hyperlipidemia - Primary   Relevant Orders   Lipid panel   Generalized anxiety disorder   Relevant Orders   CBC with Differential/Platelet   CMP14+EGFR   Depression, recurrent ([place])   Other Visit Diagnoses     Cellulitis of right toe       Relevant Orders   Ambulatory referral to Podiatry   Decreased circulation       Relevant Orders   Ambulatory referral to Podiatry     Patient says his toes are turning blue and he has had infection there, has been seeing podiatry but does not feel like they are doing anything and would like to go see a different podiatrist, placed referral, it sounds like per patient description that it might be heading towards gangrene but difficult to tell without being able to see it in person. Instructed patient if they cannot get into a foot doctor and orthopedic within the next few days then to come in and we can take a look at it and hopefully get them in the right direction all  Follow up plan: Return in about 3 months (around 10/07/2021), or if symptoms worsen or fail to improve, for hld anxiety and depression.      I discussed the assessment and treatment plan with the patient. The patient was provided an opportunity to ask questions and all were answered. The patient agreed with the plan and demonstrated an understanding of the instructions.   The patient was advised to call back or seek an in-person evaluation if the symptoms worsen or if the condition fails to improve as anticipated.  The above assessment and management plan was discussed with the patient. The patient verbalized understanding of and has agreed to the management plan. Patient is aware to call the clinic if symptoms persist or worsen. Patient is aware when to return to the clinic for a follow-up visit. Patient educated on when it is appropriate to go to the emergency department.    I provided 11 minutes of non-face-to-face time during this encounter.    Worthy Rancher, MD

## 2021-07-11 ENCOUNTER — Ambulatory Visit: Payer: Medicare HMO | Admitting: Family Medicine

## 2021-08-05 ENCOUNTER — Ambulatory Visit: Payer: Medicare HMO | Admitting: Urology

## 2021-08-12 ENCOUNTER — Telehealth: Payer: Self-pay | Admitting: Family Medicine

## 2021-08-12 NOTE — Telephone Encounter (Signed)
[name] called requesting to speak with [name] whenever she is available to call her.  Did not want to say why.

## 2021-08-12 NOTE — Telephone Encounter (Signed)
Called lmtcb 

## 2021-08-14 NOTE — Telephone Encounter (Signed)
Spoke to [employer] - she did not need to talk to me, she thinks she meant Mitzi. Patient states that she will stop by the office, would not tell me what she was calling about.  ?

## 2021-08-26 ENCOUNTER — Other Ambulatory Visit: Payer: Self-pay | Admitting: Family Medicine

## 2021-08-26 DIAGNOSIS — E782 Mixed hyperlipidemia: Secondary | ICD-10-CM

## 2021-08-26 DIAGNOSIS — F411 Generalized anxiety disorder: Secondary | ICD-10-CM

## 2021-08-26 DIAGNOSIS — N3281 Overactive bladder: Secondary | ICD-10-CM

## 2021-08-26 NOTE — Telephone Encounter (Signed)
Advised sister that pt does need to continue Paxil, Niacin and simvastatin. 3 month follow up made for 5/3 at 2:10pm ?

## 2021-08-26 NOTE — Telephone Encounter (Signed)
Called pt sister to inform her of refills and to make appt, she wants to know if patient should still be taking these medications or if Dr. Keturah Barre. Wants him to stop taking them. Please advise ?

## 2021-08-26 NOTE — Telephone Encounter (Signed)
det[facility]er NTBS follow up in April, refill sent ?

## 2021-09-01 NOTE — Progress Notes (Incomplete)
?History of Present Illness:  ? ? ?Past Medical History:  ?Diagnosis Date  ? Anxiety   ? Cerebral aneurysm   ? Closed head injury   ? COPD (chronic obstructive pulmonary disease) ([place])   ? Depression   ? GERD (gastroesophageal reflux disease)   ? Hyperlipidemia   ? Hypertension   ? Seizures ([place])   ? Sleep apnea   ? ? ?No past surgical history on file. ? ?Home Medications:  ?Allergies as of 09/02/2021   ? ?   Reactions  ? Clonidine Rash  ? ?  ? ?  ?Medication List  ?  ? ?  ? Accurate as of September 01, 2021  9:04 AM. If you have any questions, ask your nurse or doctor.  ?  ?  ? ?  ? ?albuterol 108 (90 Base) MCG/ACT inhaler ?Commonly known as: VENTOLIN HFA ?2 PUFFS EVERY 6 HOURS AS NEEDED FOR WHEEZING OR SHORTNESS OF BREATH ?  ?busPIRone 10 MG tablet ?Commonly known as: BUSPAR ?TAKE ONE TABLET THREE TIMES DAILY ?  ?doxycycline 100 MG tablet ?Commonly known as: VIBRA-TABS ?Take 1 tablet (100 mg total) by mouth 2 (two) times daily. 1 po bid ?  ?feeding supplement (ENSURE COMPLETE) Liqd ?Take 237 mLs by mouth daily. ?  ?folic acid 1 MG tablet ?Commonly known as: FOLVITE ?Take 1 tablet (1 mg total) by mouth daily. ?  ?Gerhardt's butt cream Crea ?Apply 1 application topically daily. Equal parts nystatin and hydrocortisone and zinc oxide ?  ?ipratropium-albuterol 0.5-2.5 (3) MG/3ML Soln ?Commonly known as: DUONEB ?INHALE 3 MLS BY NEBULIZER 4 TIMES A DAY ?  ?KP VITAMIN B-12 PO ?Take 1 tablet by mouth daily. ?  ?Melatonin 10 MG Tabs ?Take 10 mg by mouth at bedtime. ?What changed: how much to take ?  ?mupirocin ointment 2 % ?Commonly known as: BACTROBAN ?PLACE 1 APPLICATION INTO THE NOSE 2 TIMES A DAY ?  ?Myrbetriq 25 MG Tb24 tablet ?Generic drug: mirabegron ER ?TAKE ONE TABLET ONCE DAILY ?  ?niacin 1000 MG CR tablet ?Commonly known as: NIASPAN ?TAKE 1 TABLET BY MOUTH AT BEDTIME. ?  ?omeprazole 40 MG capsule ?Commonly known as: PRILOSEC ?TAKE (1) CAPSULE DAILY ?  ?PARoxetine 20 MG tablet ?Commonly known as: PAXIL ?TAKE ONE  TABLET ONCE DAILY ?  ?potassium chloride 10 MEQ tablet ?Commonly known as: KLOR-CON ?Take 1 tablet (10 mEq total) by mouth daily. ?  ?pramipexole 0.5 MG tablet ?Commonly known as: MIRAPEX ?Take 2 tablets (1 mg total) by mouth at bedtime. ?  ?risperiDONE 2 MG tablet ?Commonly known as: RISPERDAL ?Take 1 tablet (2 mg total) by mouth at bedtime. ?  ?simvastatin 20 MG tablet ?Commonly known as: ZOCOR ?TAKE ONE TABLET AT BEDTIME ?  ?thiamine 100 MG tablet ?Take 1 tablet (100 mg total) by mouth daily. ?  ? ?  ? ? ?Allergies:  ?Allergies  ?Allergen Reactions  ? Clonidine Rash  ? ? ?Family History  ?Problem Relation Age of Onset  ? Obesity Sister   ? ? ?Social History:  reports that he has been smoking cigarettes. He has a 50.00 pack-year smoking history. He has never used smokeless tobacco. He reports current drug use. Drug: Marijuana. He reports that he does not drink alcohol. ? ?ROS: ?A complete review of systems was performed.  All systems are negative except for pertinent findings as noted. ? ?Physical Exam:  ?Vital signs in last 24 hours: ?There were no vitals taken for this visit. ?Constitutional:  Alert and oriented, No acute distress ?  Cardiovascular: Regular rate  ?Respiratory: Normal respiratory effort ?GI: Abdomen is soft, nontender, nondistended, no abdominal masses. No CVAT.  ?Genitourinary: Normal male phallus, testes are descended bilaterally and non-tender and without masses, scrotum is normal in appearance without lesions or masses, perineum is normal on inspection. ?Lymphatic: No lymphadenopathy ?Neurologic: Grossly intact, no focal deficits ?Psychiatric: Normal mood and affect ? ?I have reviewed prior pt notes ? ?I have reviewed notes from referring/previous physicians ? ?I have reviewed urinalysis results ? ?I have reviewed prior PSA results ? ? ? ?Impression/Assessment:  ?*** ? ?Plan:  ?*** ? ?

## 2021-09-02 ENCOUNTER — Ambulatory Visit: Payer: Medicaid Other | Admitting: Urology

## 2021-09-14 ENCOUNTER — Emergency Department (HOSPITAL_COMMUNITY)
Admission: EM | Admit: 2021-09-14 | Discharge: 2021-09-14 | Disposition: A | Discharge status: Home / Self Care | Payer: Medicare Other | Attending: Emergency Medicine | Admitting: Emergency Medicine

## 2021-09-14 ENCOUNTER — Encounter (HOSPITAL_COMMUNITY): Payer: Self-pay | Admitting: Emergency Medicine

## 2021-09-14 ENCOUNTER — Emergency Department (HOSPITAL_COMMUNITY): Payer: Medicare Other

## 2021-09-14 DIAGNOSIS — S6991XA Unspecified injury of right wrist, hand and finger(s), initial encounter: Secondary | ICD-10-CM | POA: Diagnosis present

## 2021-09-14 DIAGNOSIS — S60221A Contusion of right hand, initial encounter: Secondary | ICD-10-CM | POA: Insufficient documentation

## 2021-09-14 DIAGNOSIS — S60511A Abrasion of right hand, initial encounter: Secondary | ICD-10-CM

## 2021-09-14 DIAGNOSIS — Z8673 Personal history of transient ischemic attack (TIA), and cerebral infarction without residual deficits: Secondary | ICD-10-CM

## 2021-09-14 DIAGNOSIS — W010XXA Fall on same level from slipping, tripping and stumbling without subsequent striking against object, initial encounter: Secondary | ICD-10-CM | POA: Diagnosis not present

## 2021-09-14 DIAGNOSIS — S0003XA Contusion of scalp, initial encounter: Secondary | ICD-10-CM | POA: Diagnosis not present

## 2021-09-14 DIAGNOSIS — Z23 Encounter for immunization: Secondary | ICD-10-CM | POA: Diagnosis not present

## 2021-09-14 MED ORDER — TETANUS-DIPHTH-ACELL PERTUSSIS 5-2.5-18.5 LF-MCG/0.5 IM SUSY
0.5000 mL | PREFILLED_SYRINGE | Freq: Once | INTRAMUSCULAR | Status: AC
  Administered 2021-09-14: 0.5 mL via INTRAMUSCULAR
  Filled 2021-09-14: qty 0.5

## 2021-09-14 NOTE — ED Triage Notes (Signed)
Pt arrives via EMS from home with reports of frequent falls over past couple days. Pt has slurred speech and right side weakness, EMS reports it deficits from previous stroke. Pt denies any complaints. Skin tear to right elbow and knuckles.  ?

## 2021-09-14 NOTE — Discharge Instructions (Signed)
It was our pleasure to provide your ER care today - we hope that you feel better. ? ?Fall precautions - use great care, assistance, and/or walker to help minimize risk of falling and injury.  ? ?Keep abrasion very clean. Take acetaminophen as need. ? ?Return to ER if worse, new symptoms, new/severe pain, fevers, chest pain, trouble breathing, or other concern.  ?

## 2021-09-14 NOTE — ED Notes (Signed)
Non-adherent sterile dsg applied to skintear on R arm and wrapped in gauze.  ?

## 2021-09-14 NOTE — ED Provider Notes (Addendum)
[facility] EMERGENCY DEPARTMENT Provider Note   CSN: [id number] Arrival date & time: 09/14/21  1735     History  Chief Complaint  Patient presents with   [name] is a 66 y.o. male.  Patient with remote hx cva, hx falls, presents s/p fall. States has cane/walker but does not use. Mechanical fall today, denies any faintness or dizziness. No loc. Unsure if hit head. Abrasion to dorsum right hand. Last tetanus unknown  per pt. Denies neck or back pain. No chest pain or sob. No abd pain or nv. Denies other extremity pain or injury. No fever or chills. States other than fall, feels fine, at baseline. No anticoag use.   The history is provided by the patient, medical records and the EMS personnel.  Fall Pertinent negatives include no chest pain, no abdominal pain, no headaches and no shortness of breath.      Home Medications Prior to Admission medications   Medication Sig Start Date End Date Taking? Authorizing Provider  albuterol (VENTOLIN HFA) 108 (90 Base) MCG/ACT inhaler 2 PUFFS EVERY 6 HOURS AS NEEDED FOR WHEEZING OR SHORTNESS OF BREATH 11/20/19   Dettinger, Elige Radon, MD  busPIRone (BUSPAR) 10 MG tablet TAKE ONE TABLET THREE TIMES DAILY 08/26/21   Dettinger, Elige Radon, MD  Cyanocobalamin (KP VITAMIN B-12 PO) Take 1 tablet by mouth daily.    [provider]  doxycycline (VIBRA-TABS) 100 MG tablet Take 1 tablet (100 mg total) by mouth 2 (two) times daily. 1 po bid 03/21/21   Dettinger, Elige Radon, MD  feeding supplement, ENSURE COMPLETE, (ENSURE COMPLETE) LIQD Take 237 mLs by mouth daily. 09/19/20   Dettinger, Elige Radon, MD  folic acid (FOLVITE) 1 MG tablet Take 1 tablet (1 mg total) by mouth daily. 12/02/19   Emokpae, Courage, MD  ipratropium-albuterol (DUONEB) 0.5-2.5 (3) MG/3ML SOLN INHALE 3 MLS BY NEBULIZER 4 TIMES A DAY 07/01/21   Dettinger, Elige Radon, MD  Melatonin 10 MG TABS Take 10 mg by mouth at bedtime. Patient taking differently: Take 10-20 mg by mouth at  bedtime. 08/09/19   Dettinger, Elige Radon, MD  mupirocin ointment (BACTROBAN) 2 % PLACE 1 APPLICATION INTO THE NOSE 2 TIMES A DAY 03/13/20   Dettinger, Elige Radon, MD  MYRBETRIQ 25 MG TB24 tablet TAKE ONE TABLET ONCE DAILY 08/26/21   Dettinger, Elige Radon, MD  niacin (NIASPAN) 1000 MG CR tablet TAKE 1 TABLET BY MOUTH AT BEDTIME. 08/26/21   Dettinger, Elige Radon, MD  Nystatin (GERHARDT'S BUTT CREAM) CREA Apply 1 application topically daily. Equal parts nystatin and hydrocortisone and zinc oxide 03/15/20   Dettinger, Elige Radon, MD  omeprazole (PRILOSEC) 40 MG capsule TAKE (1) CAPSULE DAILY 08/26/21   Dettinger, Elige Radon, MD  PARoxetine (PAXIL) 20 MG tablet TAKE ONE TABLET ONCE DAILY 08/26/21   Dettinger, Elige Radon, MD  potassium chloride (KLOR-CON) 10 MEQ tablet Take 1 tablet (10 mEq total) by mouth daily. 12/03/19   Bethann Berkshire, MD  pramipexole (MIRAPEX) 0.5 MG tablet Take 2 tablets (1 mg total) by mouth at bedtime. 09/19/20   Dettinger, Elige Radon, MD  risperiDONE (RISPERDAL) 2 MG tablet Take 1 tablet (2 mg total) by mouth at bedtime. 09/19/20   Dettinger, Elige Radon, MD  simvastatin (ZOCOR) 20 MG tablet TAKE ONE TABLET AT BEDTIME 08/26/21   Dettinger, Elige Radon, MD  thiamine 100 MG tablet Take 1 tablet (100 mg total) by mouth daily. 12/02/19   Shon Hale, MD  Allergies    Clonidine    Review of Systems   Review of Systems  Constitutional:  Negative for fever.  HENT:  Negative for sore throat.   Eyes:  Negative for visual disturbance.  Respiratory:  Negative for shortness of breath.   Cardiovascular:  Negative for chest pain and palpitations.  Gastrointestinal:  Negative for abdominal pain, blood in stool, diarrhea and vomiting.  Genitourinary:  Negative for flank pain.  Musculoskeletal:  Negative for back pain and neck pain.  Skin:  Negative for rash.  Neurological:  Negative for headaches.  Hematological:  Does not bruise/bleed easily.  Psychiatric/Behavioral:  Negative for confusion.    Physical  Exam Updated Vital Signs BP (!) 151/69   Pulse 70   Temp 97.9 F (36.6 C) (Oral)   Resp 16   Ht 1.702 m (5\' 7" )   Wt 55 kg   SpO2 99%   BMI 18.99 kg/m  Physical Exam Vitals and nursing note reviewed.  Constitutional:      Appearance: Normal appearance. He is well-developed.  HENT:     Head:     Comments: Mild tenderness scalp    Nose: Nose normal.     Mouth/Throat:     Mouth: Mucous membranes are moist.     Pharynx: Oropharynx is clear.  Eyes:     General: No scleral icterus.    Conjunctiva/sclera: Conjunctivae normal.     Pupils: Pupils are equal, round, and reactive to light.  Neck:     Vascular: No carotid bruit.     Trachea: No tracheal deviation.  Cardiovascular:     Rate and Rhythm: Normal rate and regular rhythm.     Pulses: Normal pulses.     Heart sounds: Normal heart sounds. No murmur heard.   No friction rub. No gallop.  Pulmonary:     Effort: Pulmonary effort is normal. No accessory muscle usage or respiratory distress.     Breath sounds: Normal breath sounds.  Chest:     Chest wall: No tenderness.  Abdominal:     General: Bowel sounds are normal. There is no distension.     Palpations: Abdomen is soft.     Tenderness: There is no abdominal tenderness. There is no guarding.     Comments: No abd contusion or bruising.   Genitourinary:    Comments: No cva tenderness. Musculoskeletal:        General: No swelling.     Cervical back: Normal range of motion and neck supple. No rigidity or tenderness.     Comments: CTLS spine, non tender, aligned, no step off. Tenderness and superficial abrasions to dorsum right hand, otherwise good rom bilateral extremities without pain or focal bony tenderness.   Skin:    General: Skin is warm and dry.     Findings: No rash.  Neurological:     Mental Status: He is alert.     Comments: Alert, speech clear. GCS 15. Right sided weakness/prior cva, at baseline per pt. No new focal numbness/weakness noted.   Psychiatric:         Mood and Affect: Mood normal.    ED Results / Procedures / Treatments   Labs (all labs ordered are listed, but only abnormal results are displayed) Labs Reviewed - No data to display  EKG None  Radiology CT Head Wo Contrast  Result Date: 09/14/2021 CLINICAL DATA:  Head trauma. Frequent falls over the last couple of days. Slurred speech and right-sided weakness. EXAM: CT HEAD WITHOUT CONTRAST TECHNIQUE:  Contiguous axial images were obtained from the base of the skull through the vertex without intravenous contrast. RADIATION DOSE REDUCTION: This exam was performed according to the departmental dose-optimization program which includes automated exposure control, adjustment of the mA and/or kV according to patient size and/or use of iterative reconstruction technique. COMPARISON:  March 09, 2021 FINDINGS: Brain: No subdural, epidural, or subarachnoid hemorrhage. The ventricles are mildly prominent but stable. Sulci are unchanged. Cerebellum, brainstem, and basal cisterns are unremarkable. Scattered white matter changes including a lacunar infarct in the left thalamus are stable. No acute cortical ischemia or infarct. No mass effect or midline shift. Vascular: Calcified atherosclerosis in the intracranial carotids. 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 The skull is otherwise unremarkable. Sinuses/Orbits: Mucosal thickening is identified in the right maxillary sinus. Near complete opacification of the left maxillary sinus is again identified. Other: None. IMPRESSION: 1. Chronic white matter changes. No acute intracranial abnormalities identified. 2. Sinus disease as above. Electronically Signed   By: Gerome Sam III M.D.   On: 09/14/2021 18:23   DG Hand Complete Right  Result Date: 09/14/2021 CLINICAL DATA:  Pain. EXAM: RIGHT HAND - COMPLETE 3+ VIEW COMPARISON:  None. FINDINGS: There is no evidence for acute fracture or dislocation. There is severe radiocarpal joint space  narrowing. The lunate appears small and sclerotic. Joint spaces are otherwise well maintained as can be seen. There is some mild degenerative changes of distal interphalangeal joints in the second, third and fourth fingers. Peripheral vascular calcifications are present. IMPRESSION: 1. No acute fracture or dislocation. 2. 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 Mild degenerative changes of distal interphalangeal joints. 3. The lunate appears sclerotic and small in size which can be seen with osteonecrosis. Electronically Signed   By: Darliss Cheney M.D.   On: 09/14/2021 18:37    Procedures Procedures    Medications Ordered in ED Medications  Tdap (BOOSTRIX) injection 0.5 mL (has no administration in time range)    ED Course/ Medical Decision Making/ A&P                           Medical Decision Making Problems Addressed: Abrasion of right hand, initial encounter: acute illness or injury Contusion of right hand, initial encounter: acute illness or injury Contusion of scalp, initial encounter: acute illness or injury that poses a threat to life or bodily functions Fall from slip, trip, or stumble, initial encounter: acute illness or injury that poses a threat to life or bodily functions History of CVA (cerebrovascular accident): chronic illness or injury that poses a threat to life or bodily functions  Amount and/or Complexity of Data Reviewed Independent Historian: EMS External Data Reviewed: notes. Radiology: ordered and independent interpretation performed. Decision-making details documented in ED Course.  Risk Prescription drug management.   Imaging ordered.   Reviewed nursing notes and prior charts for additional history.  External reports reviewed, additional hx from ems.   Xrays reviewed/interpreted by me -  no fx.  CT reviewed/interpreted by me - no hem.  Wound cleaned, sterile dressing. Tetanus im.  Pt asking for food/drink - provided.   Pt appears  stable for d/c.   RN indicates pt family member requests 'big toe be checked as possible discussion of future need for amputation.'  Right great toe appears chronically contracted. No ulceration, discoloration, cellulitis, or gangrene of any of patients toes. Feet/toes of normal color and warmth. Normal cap refill distally.  Final Clinical Impression(s) / ED Diagnoses Final diagnoses:  None    Rx / DC Orders ED Discharge Orders     None          Cathren Laine, MD 09/14/21 1921

## 2021-09-22 ENCOUNTER — Other Ambulatory Visit: Payer: Self-pay | Admitting: Family Medicine

## 2021-09-22 DIAGNOSIS — J439 Emphysema, unspecified: Secondary | ICD-10-CM

## 2021-09-29 ENCOUNTER — Telehealth: Payer: Self-pay | Admitting: Family Medicine

## 2021-09-29 NOTE — Telephone Encounter (Signed)
It sounds like he has worsening, the 2 options at this point is taken back to the emergency department to make sure that nothing else is wrong or we see him here in office and we can talk about options for disc replacement, I believe he is already in a facility but cannot recall. ?

## 2021-09-29 NOTE — Telephone Encounter (Signed)
Patients sister called requesting to speak with Dr Oris Drone nurse. Has questions about patients health. ?

## 2021-09-29 NOTE — Telephone Encounter (Signed)
Left message for Azzie advising of Dr. Merita Norton recommendations and to call our office back if needed to schedule an appt. ?

## 2021-09-29 NOTE — Telephone Encounter (Signed)
Called and spoke with his sister. She states she is noticing a big change in him he can barley walk and get up and go. He is falling a lot she took him to Er at the begin of the month. States he is getting very agitated with her and stare off in space she is not sure what is going on. He does have an appointment with you 10/14/21. Tried to make a sooner appointment she didn't want that just wanted you to know and see what your thoughts were. Please advise.  ?

## 2021-10-03 ENCOUNTER — Telehealth: Payer: Self-pay | Admitting: Family Medicine

## 2021-10-03 NOTE — Telephone Encounter (Signed)
Patient's sister calling to speak to Dr Dettinger's nurse. No information given, other than she would like to talk to nurse about patient.  ?

## 2021-10-03 NOTE — Telephone Encounter (Signed)
Called and spoke with sister. She is aware we did not call and say we where putting patient anywhere. She states someone called and said they where going to put him somewhere. Patient is throwing pills in the trash when sister givens them to him. She states she knows he is losing it he has an appointment on 05/03, and will discuss this with him when he comes in for the visit just an FYI.  ?

## 2021-10-13 ENCOUNTER — Telehealth: Payer: Self-pay | Admitting: Family Medicine

## 2021-10-13 NOTE — Telephone Encounter (Signed)
Thinking about [facility]. Has an appt on Wednesday with Dettinger. ? ?Needs Dettinger to approve for pt to be placed in [facility]. Sister states that pt is just giving up, making messes with stool in the bathroom. Sister is disabled herself and cannot continue to take care of him. ? ?Informed sister that this does need to be addressed during the visit on Wednesday and the appropriate forms will need to be completed. Sister understands and will be coming with pt to the visit. ?

## 2021-10-15 ENCOUNTER — Other Ambulatory Visit: Payer: Self-pay

## 2021-10-15 ENCOUNTER — Encounter: Payer: Self-pay | Admitting: Family Medicine

## 2021-10-15 ENCOUNTER — Ambulatory Visit (INDEPENDENT_AMBULATORY_CARE_PROVIDER_SITE_OTHER): Payer: Medicare Other | Admitting: Family Medicine

## 2021-10-15 VITALS — BP 126/56 | HR 88 | Ht 67.0 in | Wt 124.0 lb

## 2021-10-15 DIAGNOSIS — R634 Abnormal weight loss: Secondary | ICD-10-CM

## 2021-10-15 DIAGNOSIS — F339 Major depressive disorder, recurrent, unspecified: Secondary | ICD-10-CM | POA: Diagnosis not present

## 2021-10-15 DIAGNOSIS — J439 Emphysema, unspecified: Secondary | ICD-10-CM

## 2021-10-15 DIAGNOSIS — Z23 Encounter for immunization: Secondary | ICD-10-CM

## 2021-10-15 DIAGNOSIS — G4700 Insomnia, unspecified: Secondary | ICD-10-CM

## 2021-10-15 DIAGNOSIS — F411 Generalized anxiety disorder: Secondary | ICD-10-CM | POA: Diagnosis not present

## 2021-10-15 DIAGNOSIS — Z8679 Personal history of other diseases of the circulatory system: Secondary | ICD-10-CM

## 2021-10-15 DIAGNOSIS — Z1211 Encounter for screening for malignant neoplasm of colon: Secondary | ICD-10-CM | POA: Diagnosis not present

## 2021-10-15 DIAGNOSIS — I10 Essential (primary) hypertension: Secondary | ICD-10-CM

## 2021-10-15 DIAGNOSIS — E782 Mixed hyperlipidemia: Secondary | ICD-10-CM

## 2021-10-15 DIAGNOSIS — R270 Ataxia, unspecified: Secondary | ICD-10-CM

## 2021-10-15 DIAGNOSIS — M5030 Other cervical disc degeneration, unspecified cervical region: Secondary | ICD-10-CM

## 2021-10-15 DIAGNOSIS — R531 Weakness: Secondary | ICD-10-CM

## 2021-10-15 DIAGNOSIS — R413 Other amnesia: Secondary | ICD-10-CM

## 2021-10-15 NOTE — Progress Notes (Signed)
? ?BP (!) 126/56   Pulse 88   Ht _0  (1.702 m)   Wt 124 lb (56.2 kg)   SpO2 99%   BMI 19.42 kg/m?   ? ?Subjective:  ? ?Patient ID: [name], male    DOB: Oct 21, 1955, 66 y.o.   MRN: [id number] ? ?HPI: ?[name] is a 66 y.o. male presenting on 10/15/2021 for Medical Management of Chronic Issues, Hyperlipidemia, Hypertension, Anxiety, and Discuss placement to assisted living ? ? ?HPI ?Hypertension ?Patient is currently on no medicine currently because his blood pressure has been running lower, and their blood pressure today is 126/56. Patient denies any lightheadedness or dizziness. Patient denies headaches, blurred vision, chest pains, shortness of breath, or weakness. Denies any side effects from medication and is content with current medication.  ? ?COPD ?Patient is coming in for COPD recheck today.  He is currently on albuterol and DuoNebs.  He has a mild chronic cough but denies any major coughing spells or wheezing spells.  He has 5nighttime symptoms per week and 7daytime symptoms per week currently.  Patient still smokes at least a pack per day and has no desire to quit but denies any breathing issue today ? ?Anxiety and depression recheck ?Mirapex and Risperdal and Paxil and buspirone, he is stable on these and doing well.  Denies any suicidal ideations and mood is even. ? ?  10/15/2021  ?  2:00 PM 10/15/2021  ?  1:59 PM 03/21/2021  ? 10:59 AM 02/28/2021  ?  3:38 PM 11/05/2020  ?  9:21 AM  ?Depression screen PHQ 2/9  ?Decreased Interest  3 0 0 2  ?Down, Depressed, Hopeless  2 0 0 2  ?PHQ - 2 Score  5 0 0 4  ?Altered sleeping  2 0 0 2  ?Tired, decreased energy  3 0 0 2  ?Change in appetite  3 0 0 0  ?Feeling bad or failure about yourself   2 0 0 0  ?Trouble concentrating  3 0 0 2  ?Moving slowly or fidgety/restless  3 0 0 2  ?Suicidal thoughts  0 0 0 0  ?PHQ-9 Score  21 0 0 12  ?Difficult doing work/chores Somewhat difficult  Not difficult at all Not difficult at all Somewhat difficult  ?   ?Hyperlipidemia ?Patient is coming in for recheck of his hyperlipidemia. The patient is currently taking simvastatin. They deny any issues with myalgias or history of liver damage from it. They deny any focal numbness or weakness or chest pain.  ? ?Patient has a history of cerebral aneurysm and left-sided weakness and ataxia and difficulty ambulating.  He is here with his family member who is also his caretaker and says it is just becoming more more difficult to take care of him at home because he is getting weaker and less mobile and she is worried about falls and worried about that she cannot leave him alone because of his fall risk.  He is still some ambulatory.  His memory seems to be stable and not worsening.  She does do all of his ADLs including cook clean and help him bathe and changes beds and sometimes he has some stool incontinence accidents and urinary incontinence accidents but most the time he is continent. ? ?Relevant past medical, surgical, family and social history reviewed and updated as indicated. Interim medical history since our last visit reviewed. ?Allergies and medications reviewed and updated. ? ?Review of Systems  ?Constitutional:  Negative for chills and  fever.  ?Eyes:  Negative for discharge.  ?Respiratory:  Positive for cough and wheezing. Negative for chest tightness and shortness of breath.   ?Cardiovascular:  Negative for chest pain and leg swelling.  ?Gastrointestinal:  Negative for abdominal distention and abdominal pain.  ?Genitourinary:  Negative for decreased urine volume, difficulty urinating and urgency.  ?Musculoskeletal:  Positive for gait problem. Negative for back pain.  ?Skin:  Negative for rash.  ?Neurological:  Negative for dizziness, numbness and headaches.  ?Psychiatric/Behavioral:  Negative for dysphoric mood, self-injury, sleep disturbance and suicidal ideas. The patient is not nervous/anxious.   ?All other systems reviewed and are negative. ? ?Per HPI unless  specifically indicated above ? ? ?Allergies as of 10/15/2021   ? ?   Reactions  ? Clonidine Rash  ? ?  ? ?  ?Medication List  ?  ? ?  ? Accurate as of Oct 15, 2021  2:46 PM. If you have any questions, ask your nurse or doctor.  ?  ?  ? ?  ? ?albuterol 108 (90 Base) MCG/ACT inhaler ?Commonly known as: VENTOLIN HFA ?2 PUFFS EVERY 6 HOURS AS NEEDED FOR WHEEZING OR SHORTNESS OF BREATH ?  ?busPIRone 10 MG tablet ?Commonly known as: BUSPAR ?TAKE ONE TABLET THREE TIMES DAILY ?  ?doxycycline 100 MG tablet ?Commonly known as: VIBRA-TABS ?Take 1 tablet (100 mg total) by mouth 2 (two) times daily. 1 po bid ?  ?feeding supplement (ENSURE COMPLETE) Liqd ?Take 237 mLs by mouth daily. ?  ?folic acid 1 MG tablet ?Commonly known as: FOLVITE ?Take 1 tablet (1 mg total) by mouth daily. ?  ?Gerhardt's butt cream Crea ?Apply 1 application topically daily. Equal parts nystatin and hydrocortisone and zinc oxide ?  ?ipratropium-albuterol 0.5-2.5 (3) MG/3ML Soln ?Commonly known as: DUONEB ?INHALE 3 MLS BY NEBULIZER 4 TIMES A DAY ?  ?KP VITAMIN B-12 PO ?Take 1 tablet by mouth daily. ?  ?Melatonin 10 MG Tabs ?Take 10 mg by mouth at bedtime. ?What changed: how much to take ?  ?mupirocin ointment 2 % ?Commonly known as: BACTROBAN ?PLACE 1 APPLICATION INTO THE NOSE 2 TIMES A DAY ?  ?Myrbetriq 25 MG Tb24 tablet ?Generic drug: mirabegron ER ?TAKE ONE TABLET ONCE DAILY ?  ?niacin 1000 MG CR tablet ?Commonly known as: NIASPAN ?TAKE 1 TABLET BY MOUTH AT BEDTIME. ?  ?omeprazole 40 MG capsule ?Commonly known as: PRILOSEC ?TAKE (1) CAPSULE DAILY ?  ?PARoxetine 20 MG tablet ?Commonly known as: PAXIL ?TAKE ONE TABLET ONCE DAILY ?  ?potassium chloride 10 MEQ tablet ?Commonly known as: KLOR-CON ?Take 1 tablet (10 mEq total) by mouth daily. ?  ?pramipexole 0.5 MG tablet ?Commonly known as: MIRAPEX ?Take 2 tablets (1 mg total) by mouth at bedtime. ?  ?risperiDONE 2 MG tablet ?Commonly known as: RISPERDAL ?Take 1 tablet (2 mg total) by mouth at bedtime. ?   ?simvastatin 20 MG tablet ?Commonly known as: ZOCOR ?TAKE ONE TABLET AT BEDTIME ?  ?thiamine 100 MG tablet ?Take 1 tablet (100 mg total) by mouth daily. ?  ? ?  ? ? ? ?Objective:  ? ?BP (!) 126/56   Pulse 88   Ht _0  (1.702 m)   Wt 124 lb (56.2 kg)   SpO2 99%   BMI 19.42 kg/m?   ?Wt Readings from Last 3 Encounters:  ?10/15/21 124 lb (56.2 kg)  ?09/14/21 121 lb 4.1 oz (55 kg)  ?03/21/21 121 lb 3.2 oz (55 kg)  ?  ?Physical Exam ?Vitals and nursing note  reviewed.  ?Constitutional:   ?   General: He is not in acute distress. ?   Appearance: He is well-developed. He is not diaphoretic.  ?Eyes:  ?   General: No scleral icterus. ?   Conjunctiva/sclera: Conjunctivae normal.  ?Neck:  ?   Thyroid: No thyromegaly.  ?Cardiovascular:  ?   Rate and Rhythm: Normal rate and regular rhythm.  ?   Heart sounds: Normal heart sounds. No murmur heard. ?Pulmonary:  ?   Effort: Pulmonary effort is normal. No respiratory distress.  ?   Breath sounds: Normal breath sounds. No wheezing or rhonchi.  ?Musculoskeletal:     ?   General: No swelling. Normal range of motion.  ?   Cervical back: Neck supple.  ?Lymphadenopathy:  ?   Cervical: No cervical adenopathy.  ?Skin: ?   General: Skin is warm and dry.  ?   Findings: No rash.  ?Neurological:  ?   Mental Status: He is alert and oriented to person, place, and time.  ?   Coordination: Coordination normal.  ?Psychiatric:     ?   Behavior: Behavior normal.  ? ? ? ? ?Assessment & Plan:  ? ?Problem List Items Addressed This Visit   ? ?  ? Cardiovascular and Mediastinum  ? Essential hypertension  ?  ? Respiratory  ? COPD (chronic obstructive pulmonary disease) ([place])  ?  ? Nervous and Auditory  ? Right sided weakness  ?  ? Musculoskeletal and Integument  ? DDD (degenerative disc disease), cervical  ?  ? Other  ? Hyperlipidemia - Primary  ? Relevant Orders  ? CBC with Differential/Platelet  ? CMP14+EGFR  ? Lipid panel  ? Generalized anxiety disorder  ? Relevant Orders  ? TSH  ? Depression,  recurrent ([place])  ? Relevant Orders  ? TSH  ? Memory loss or impairment  ? History of cerebral aneurysm  ? Ataxia  ? Relevant Orders  ? TSH  ? ?Other Visit Diagnoses   ? ? Colon cancer screening      ? Relevant Orders  ? Cologuard  ?

## 2021-10-15 NOTE — Addendum Note (Signed)
Addended by: Dorene Sorrow on: 10/15/2021 03:00 PM ? ? Modules accepted: Orders ? ?

## 2021-10-16 LAB — CMP14+EGFR
ALT: 17 IU/L (ref 0–44)
AST: 27 IU/L (ref 0–40)
Albumin/Globulin Ratio: 1.8 (ref 1.2–2.2)
Albumin: 4.2 g/dL (ref 3.8–4.8)
Alkaline Phosphatase: 76 IU/L (ref 44–121)
BUN/Creatinine Ratio: 26 — ABNORMAL HIGH (ref 10–24)
BUN: 19 mg/dL (ref 8–27)
Bilirubin Total: 0.2 mg/dL (ref 0.0–1.2)
CO2: 25 mmol/L (ref 20–29)
Calcium: 9.5 mg/dL (ref 8.6–10.2)
Chloride: 103 mmol/L (ref 96–106)
Creatinine, Ser: 0.74 mg/dL — ABNORMAL LOW (ref 0.76–1.27)
Globulin, Total: 2.4 g/dL (ref 1.5–4.5)
Glucose: 81 mg/dL (ref 70–99)
Potassium: 4 mmol/L (ref 3.5–5.2)
Sodium: 138 mmol/L (ref 134–144)
Total Protein: 6.6 g/dL (ref 6.0–8.5)
eGFR: 99 mL/min/{1.73_m2} (ref >59)

## 2021-10-16 LAB — CBC WITH DIFFERENTIAL/PLATELET
Basophils Absolute: 0.1 10*3/uL (ref 0.0–0.2)
Basos: 1 %
EOS (ABSOLUTE): 0 10*3/uL (ref 0.0–0.4)
Eos: 1 %
Hematocrit: 35.4 % — ABNORMAL LOW (ref 37.5–51.0)
Hemoglobin: 12.2 g/dL — ABNORMAL LOW (ref 13.0–17.7)
Immature Grans (Abs): 0 10*3/uL (ref 0.0–0.1)
Immature Granulocytes: 0 %
Lymphocytes Absolute: 1.5 10*3/uL (ref 0.7–3.1)
Lymphs: 21 %
MCH: 33.7 pg — ABNORMAL HIGH (ref 26.6–33.0)
MCHC: 34.5 g/dL (ref 31.5–35.7)
MCV: 98 fL — ABNORMAL HIGH (ref 79–97)
Monocytes Absolute: 0.7 10*3/uL (ref 0.1–0.9)
Monocytes: 10 %
Neutrophils Absolute: 4.8 10*3/uL (ref 1.4–7.0)
Neutrophils: 67 %
Platelets: 221 10*3/uL (ref 150–450)
RBC: 3.62 x10E6/uL — ABNORMAL LOW (ref 4.14–5.80)
RDW: 12.3 % (ref 11.6–15.4)
WBC: 7.1 10*3/uL (ref 3.4–10.8)

## 2021-10-16 LAB — LIPID PANEL
Chol/HDL Ratio: 2 ratio (ref 0.0–5.0)
Cholesterol, Total: 125 mg/dL (ref 100–199)
HDL: 63 mg/dL (ref >39)
LDL Chol Calc (NIH): 52 mg/dL (ref 0–99)
Triglycerides: 39 mg/dL (ref 0–149)
VLDL Cholesterol Cal: 10 mg/dL (ref 5–40)

## 2021-10-16 LAB — MICROALBUMIN / CREATININE URINE RATIO
Creatinine, Urine: 150.7 mg/dL
Microalb/Creat Ratio: 144 mg/g creat — ABNORMAL HIGH (ref 0–29)
Microalbumin, Urine: 217.3 ug/mL

## 2021-10-16 LAB — TSH: TSH: 0.732 u[IU]/mL (ref 0.450–4.500)

## 2021-10-25 ENCOUNTER — Other Ambulatory Visit: Payer: Self-pay | Admitting: Family Medicine

## 2021-10-25 DIAGNOSIS — N3281 Overactive bladder: Secondary | ICD-10-CM

## 2021-11-06 ENCOUNTER — Ambulatory Visit (INDEPENDENT_AMBULATORY_CARE_PROVIDER_SITE_OTHER): Payer: Medicare Other

## 2021-11-06 ENCOUNTER — Telehealth: Payer: Self-pay

## 2021-11-06 VITALS — Wt 125.0 lb

## 2021-11-06 DIAGNOSIS — Z0000 Encounter for general adult medical examination without abnormal findings: Secondary | ICD-10-CM | POA: Diagnosis not present

## 2021-11-06 NOTE — Telephone Encounter (Signed)
Unfortunately I do not have anything else necessarily to do except for make sure he is drinking plenty of fluids.  We could get him to a neurologist and have him seen there, I cannot recall who his neurologist has been in the past but they could call them up and get to his neurologist again.  If anything less she can bring him in here and we can evaluate him and see where he needs to be seen.  But he does need to be seen somewhere either the ER or neurology or here

## 2021-11-06 NOTE — Progress Notes (Signed)
Subjective:   [name] is a 66 y.o. male who presents for Medicare Annual/Subsequent preventive examination.  Virtual Visit via Telephone Note  I connected with  [name] on 11/06/21 at  9:00 AM EDT by telephone and verified that I am speaking with the correct person using two identifiers.  Location: Patient: Home Provider: [facility] Persons participating in the virtual visit: patient, sister [name], & Nurse Health Advisor   I discussed the limitations, risks, security and privacy concerns of performing an evaluation and management service by telephone and the availability of in person appointments. The patient expressed understanding and agreed to proceed.  Interactive audio and video telecommunications were attempted between this nurse and patient, however failed, due to patient having technical difficulties OR patient did not have access to video capability.  We continued and completed visit with audio only.  Some vital signs may be absent or patient reported.   [name] E Patrick Azizi, LPN   Review of Systems     Cardiac Risk Factors include: advanced age (>29men, >65 women);sedentary lifestyle;dyslipidemia;hypertension;male gender;smoking/ tobacco exposure;Other (see comment), Risk factor comments: CAD, COPD, OSA, hx of cerebral aneurysm, macrocytic anemia     Objective:    Today's Vitals   11/06/21 0907  Weight: 125 lb (56.7 kg)   Body mass index is 19.58 kg/m.     11/06/2021    9:27 AM 03/09/2021    6:10 PM 01/24/2021    7:32 PM 11/05/2020    9:36 AM 12/04/2019   10:00 AM 12/03/2019    3:24 PM 11/29/2019    8:36 PM  Advanced Directives  Does Patient Have a Medical Advance Directive? Yes No No Yes No No   Type of Estate agent of [place];Living will   Healthcare Power of Attorney     Copy of Healthcare Power of Attorney in Chart? No - copy requested   No - copy requested     Would patient like information on creating a medical advance  directive?     No - Patient declined  No - Patient declined    Current Medications (verified) Outpatient Encounter Medications as of 11/06/2021  Medication Sig   busPIRone (BUSPAR) 10 MG tablet TAKE ONE TABLET THREE TIMES DAILY   Cyanocobalamin (KP VITAMIN B-12 PO) Take 1 tablet by mouth daily.   feeding supplement, ENSURE COMPLETE, (ENSURE COMPLETE) LIQD Take 237 mLs by mouth daily.   folic acid (FOLVITE) 1 MG tablet Take 1 tablet (1 mg total) by mouth daily.   ipratropium-albuterol (DUONEB) 0.5-2.5 (3) MG/3ML SOLN INHALE 3 MLS BY NEBULIZER 4 TIMES A DAY   Melatonin 10 MG TABS Take 10 mg by mouth at bedtime. (Patient taking differently: Take 10-20 mg by mouth at bedtime.)   mupirocin ointment (BACTROBAN) 2 % PLACE 1 APPLICATION INTO THE NOSE 2 TIMES A DAY   MYRBETRIQ 25 MG TB24 tablet TAKE ONE TABLET ONCE DAILY   niacin (NIASPAN) 1000 MG CR tablet TAKE 1 TABLET BY MOUTH AT BEDTIME.   Nystatin (GERHARDT'S BUTT CREAM) CREA Apply 1 application topically daily. Equal parts nystatin and hydrocortisone and zinc oxide   omeprazole (PRILOSEC) 40 MG capsule TAKE (1) CAPSULE DAILY   PARoxetine (PAXIL) 20 MG tablet TAKE ONE TABLET ONCE DAILY   potassium chloride (KLOR-CON) 10 MEQ tablet Take 1 tablet (10 mEq total) by mouth daily.   pramipexole (MIRAPEX) 0.5 MG tablet Take 2 tablets (1 mg total) by mouth at bedtime.   risperiDONE (RISPERDAL) 2 MG tablet Take 1  tablet (2 mg total) by mouth at bedtime.   simvastatin (ZOCOR) 20 MG tablet TAKE ONE TABLET AT BEDTIME   thiamine 100 MG tablet Take 1 tablet (100 mg total) by mouth daily.   albuterol (VENTOLIN HFA) 108 (90 Base) MCG/ACT inhaler 2 PUFFS EVERY 6 HOURS AS NEEDED FOR WHEEZING OR SHORTNESS OF BREATH (Patient not taking: Reported on 11/06/2021)   doxycycline (VIBRA-TABS) 100 MG tablet Take 1 tablet (100 mg total) by mouth 2 (two) times daily. 1 po bid (Patient not taking: Reported on 11/06/2021)   No facility-administered encounter medications on file  as of 11/06/2021.    Allergies (verified) Clonidine   History: Past Medical History:  Diagnosis Date   Anxiety    Cerebral aneurysm    Closed head injury    COPD (chronic obstructive pulmonary disease) (HCC)    Depression    GERD (gastroesophageal reflux disease)    Hyperlipidemia    Hypertension    Seizures (HCC)    Sleep apnea    History reviewed. No pertinent surgical history. Family History  Problem Relation Age of Onset   Obesity Sister    Social History   Socioeconomic History   Marital status: Single    Spouse name: Not on file   Number of children: 1   Years of education: Not on file   Highest education level: Not on file  Occupational History   Occupation: disabled  Tobacco Use   Smoking status: Every Day    Packs/day: 1.00    Years: 50.00    Pack years: 50.00    Types: Cigarettes   Smokeless tobacco: Never  Vaping Use   Vaping Use: Never used  Substance and Sexual Activity   Alcohol use: No   Drug use: Yes    Types: Marijuana   Sexual activity: Not Currently  Other Topics Concern   Not on file  Social History Narrative   He lives with his sister. He has one son that he doesn't get along with very well.   Patient had a stroke that left him with speech difficulties and high fall risk   Social Determinants of Health   Financial Resource Strain: Low Risk    Difficulty of Paying Living Expenses: Not hard at all  Food Insecurity: No Food Insecurity   Worried About Programme researcher, broadcasting/film/video in the Last Year: Never true   Ran Out of Food in the Last Year: Never true  Transportation Needs: No Transportation Needs   Lack of Transportation (Medical): No   Lack of Transportation (Non-Medical): No  Physical Activity: Inactive   Days of Exercise per Week: 0 days   Minutes of Exercise per Session: 0 min  Stress: No Stress Concern Present   Feeling of Stress : Not at all  Social Connections: Socially Isolated   Frequency of Communication with Friends and  Family: Never   Frequency of Social Gatherings with Friends and Family: Twice a week   Attends Religious Services: Never   Database administrator or Organizations: No   Attends Engineer, structural: Never   Marital Status: Never married    Tobacco Counseling Ready to quit: Not Answered Counseling given: Not Answered   Clinical Intake:  Pre-visit preparation completed: Yes  Pain : No/denies pain     BMI - recorded: 19.58 Nutritional Status: BMI of 19-24  Normal Nutritional Risks: None Diabetes: No  How often do you need to have someone help you when you read instructions, pamphlets, or other written  materials from your doctor or pharmacy?: 1 - Never  Diabetic? no  Interpreter Needed?: No  Information entered by :: Kaislyn Gulas, LPN   Activities of Daily Living    11/06/2021    9:14 AM  In your present state of health, do you have any difficulty performing the following activities:  Hearing? 0  Vision? 0  Difficulty concentrating or making decisions? 1  Walking or climbing stairs? 1  Dressing or bathing? 0  Doing errands, shopping? 1  Preparing Food and eating ? Y  Using the Toilet? N  In the past six months, have you accidently leaked urine? Y  Do you have problems with loss of bowel control? N  Managing your Medications? Y  Managing your Finances? Y  Housekeeping or managing your Housekeeping? Y    Patient Care Team: Dettinger, Elige RadonJoshua A, MD as PCP - General (Family Medicine)  Indicate any recent Medical Services you may have received from other than [facility] providers in the past year (date may be approximate).     Assessment:   This is a routine wellness examination for Storm.  Hearing/Vision screen Hearing Screening - Comments:: Denies hearing difficulties   Vision Screening - Comments:: Denies vision difficulties - refuses to see optometrist   Dietary issues and exercise activities discussed: Current Exercise Habits: The patient does not  participate in regular exercise at present, Exercise limited by: neurologic condition(s);respiratory conditions(s)   Goals Addressed             This Visit's Progress    Prevent falls   Not on track    Start using walker and handrails Try to do chair exercises and stretches to improved strength and balance     Quit Smoking   Not on track      Depression Screen    11/06/2021   10:11 AM 10/15/2021    1:59 PM 03/21/2021   10:59 AM 02/28/2021    3:38 PM 11/05/2020    9:21 AM 02/14/2020   11:31 AM 11/07/2019    1:05 PM  PHQ 2/9 Scores  PHQ - 2 Score 5 5 0 0 4 0 0  PHQ- 9 Score 16 21 0 0 12  11    Fall Risk    11/06/2021    9:11 AM 10/15/2021    1:58 PM 03/21/2021   10:59 AM 02/28/2021    3:37 PM 11/05/2020    9:35 AM  Fall Risk   Falls in the past year? 1 1 1  0 1  Number falls in past yr: 1 1 0  1  Injury with Fall? 0 0 0  1  Risk for fall due to : Impaired balance/gait;Orthopedic patient Impaired balance/gait;Impaired mobility History of fall(s)  History of fall(s);Impaired balance/gait;Medication side effect;Orthopedic patient;Other (Comment)  Risk for fall due to: Comment     hx of stroke  Follow up Education provided;Falls prevention discussed Falls evaluation completed Education provided  Education provided;Falls prevention discussed    FALL RISK PREVENTION PERTAINING TO THE HOME:  Any stairs in or around the home? No  If so, are there any without handrails? No  Home free of loose throw rugs in walkways, pet beds, electrical cords, etc? Yes  Adequate lighting in your home to reduce risk of falls? Yes   ASSISTIVE DEVICES UTILIZED TO PREVENT FALLS:  Life alert? No  Use of a cane, walker or w/c? Yes  Grab bars in the bathroom? Yes  Shower chair or bench in shower? Yes  Elevated toilet  seat or a handicapped toilet? Yes   TIMED UP AND GO:  Was the test performed? No . Telephonic visit  Cognitive Function:    11/06/2021    9:27 AM  MMSE - Mini Mental State Exam  Not  completed: Unable to complete;Refused        Immunizations Immunization History  Administered Date(s) Administered   Fluad Quad(high Dose 65+) 04/04/2021   Influenza,inj,Quad PF,6+ Mos 04/11/2014, 04/16/2015, 05/12/2016, 05/13/2016, 05/11/2017   Influenza,inj,quad, With Preservative 03/15/2017   Influenza-Unspecified 04/11/2014, 04/16/2015, 05/12/2016, 05/13/2016, 05/11/2017, 03/20/2019   Pneumococcal Conjugate-13 09/19/2020   Pneumococcal Polysaccharide-23 07/22/2017, 03/20/2019   Tdap 10/25/2009, 09/19/2020, 09/14/2021   Zoster Recombinat (Shingrix) 08/01/2020, 10/15/2021    TDAP status: Up to date  Flu Vaccine status: Up to date  Pneumococcal vaccine status: Up to date  Covid-19 vaccine status: Declined, Education has been provided regarding the importance of this vaccine but patient still declined. Advised may receive this vaccine at local pharmacy or Health Dept.or vaccine clinic. Aware to provide a copy of the vaccination record if obtained from local pharmacy or Health Dept. Verbalized acceptance and understanding.  Qualifies for Shingles Vaccine? Yes   Zostavax completed Yes   Shingrix Completed?: Yes  Screening Tests Health Maintenance  Topic Date Due   COVID-19 Vaccine (1) 10/26/2022 (Originally 07/29/1956)   INFLUENZA VACCINE  01/13/2022   Fecal DNA (Cologuard)  08/18/2022   URINE MICROALBUMIN  10/16/2022   TETANUS/TDAP  09/15/2031   Pneumonia Vaccine 18+ Years old  Completed   Hepatitis C Screening  Completed   Zoster Vaccines- Shingrix  Completed   HPV VACCINES  Aged Out    Health Maintenance  There are no preventive care reminders to display for this patient.  Colorectal cancer screening: Type of screening: Cologuard. Completed 08/18/2019. Repeat every 3 years  Lung Cancer Screening: (Low Dose CT Chest recommended if Age 34-80 years, 30 pack-year currently smoking OR have quit w/in 15years.) does qualify.   Lung Cancer Screening Referral:  declined  Additional Screening:  Hepatitis C Screening: does qualify; Completed 01/27/2013  Vision Screening: Recommended annual ophthalmology exams for early detection of glaucoma and other disorders of the eye. Is the patient up to date with their annual eye exam?  No  Who is the provider or what is the name of the office in which the patient attends annual eye exams? none If pt is not established with a provider, would they like to be referred to a provider to establish care? No .   Dental Screening: Recommended annual dental exams for proper oral hygiene  Community Resource Referral / Chronic Care Management: CRR required this visit?  No   CCM required this visit?  No      Plan:     I have personally reviewed and noted the following in the patient's chart:   Medical and social history Use of alcohol, tobacco or illicit drugs  Current medications and supplements including opioid prescriptions. Patient is not currently taking opioid prescriptions. Functional ability and status Nutritional status Physical activity Advanced directives List of other physicians Hospitalizations, surgeries, and ER visits in previous 12 months Vitals Screenings to include cognitive, depression, and falls Referrals and appointments  In addition, I have reviewed and discussed with patient certain preventive protocols, quality metrics, and best practice recommendations. A written personalized care plan for preventive services as well as general preventive health recommendations were provided to patient.     [place] Constable, LPN   9/60/4540   Nurse Notes: None

## 2021-11-06 NOTE — Patient Instructions (Signed)
[name] , Thank you for taking time to come for your Medicare Wellness Visit. I appreciate your ongoing commitment to your health goals. Please review the following plan we discussed and let me know if I can assist you in the future.   Screening recommendations/referrals: Colonoscopy: Cologuard done 08/18/2019 - repeat in 3 years Recommended yearly ophthalmology/optometry visit for glaucoma screening and checkup Recommended yearly dental visit for hygiene and checkup  Vaccinations: Influenza vaccine: Done 04/04/2021 - Repeat annually Pneumococcal vaccine: Done 03/20/2019 & 09/19/2020 Tdap vaccine: Done 09/14/2021 - Repeat in 10 years  Shingles vaccine: Done 08/01/2020 & 10/15/2021   Covid-19: Declined  Advanced directives: Please bring a copy of your health care power of attorney and living will to the office to be added to your chart at your convenience.   Conditions/risks identified: Aim for 30 minutes of exercise or brisk walking, 6-8 glasses of water, and 5 servings of fruits and vegetables each day.   If you wish to quit smoking, help is available. For free tobacco cessation program offerings call the [facility] at (971) 529-1130 or Live Well Line at 7174579531. You may also visit [email or web address] or email livelifewell@[email or web address] .com for more information on other programs.   You may also call 1-800-QUIT-NOW ((517)550-8087) or visit www.[place].ch or www.[facility].org for additional resources on smoking cessation.    Next appointment: Follow up in one year for your annual wellness visit.   Preventive Care 64 Years and Older, Male  Preventive care refers to lifestyle choices and visits with your health care provider that can promote health and wellness. What does preventive care include? A yearly physical exam. This is also called an annual well check. Dental exams once or twice a year. Routine eye exams. Ask your health care provider how often you should have  your eyes checked. Personal lifestyle choices, including: Daily care of your teeth and gums. Regular physical activity. Eating a healthy diet. Avoiding tobacco and drug use. Limiting alcohol use. Practicing safe sex. Taking low doses of aspirin every day. Taking vitamin and mineral supplements as recommended by your health care provider. What happens during an annual well check? The services and screenings done by your health care provider during your annual well check will depend on your age, overall health, lifestyle risk factors, and family history of disease. Counseling  Your health care provider may ask you questions about your: Alcohol use. Tobacco use. Drug use. Emotional well-being. Home and relationship well-being. Sexual activity. Eating habits. History of falls. Memory and ability to understand (cognition). Work and work Astronomer. Screening  You may have the following tests or measurements: Height, weight, and BMI. Blood pressure. Lipid and cholesterol levels. These may be checked every 5 years, or more frequently if you are over 53 years old. Skin check. Lung cancer screening. You may have this screening every year starting at age 30 if you have a 30-pack-year history of smoking and currently smoke or have quit within the past 15 years. Fecal occult blood test (FOBT) of the stool. You may have this test every year starting at age 67. Flexible sigmoidoscopy or colonoscopy. You may have a sigmoidoscopy every 5 years or a colonoscopy every 10 years starting at age 8. Prostate cancer screening. Recommendations will vary depending on your family history and other risks. Hepatitis C blood test. Hepatitis B blood test. Sexually transmitted disease (STD) testing. Diabetes screening. This is done by checking your blood sugar (glucose) after you have not eaten for a while (fasting).  You may have this done every 1-3 years. Abdominal aortic aneurysm (AAA) screening. You may  need this if you are a current or former smoker. Osteoporosis. You may be screened starting at age 68 if you are at high risk. Talk with your health care provider about your test results, treatment options, and if necessary, the need for more tests. Vaccines  Your health care provider may recommend certain vaccines, such as: Influenza vaccine. This is recommended every year. Tetanus, diphtheria, and acellular pertussis (Tdap, Td) vaccine. You may need a Td booster every 10 years. Zoster vaccine. You may need this after age 68. Pneumococcal 13-valent conjugate (PCV13) vaccine. One dose is recommended after age 68. Pneumococcal polysaccharide (PPSV23) vaccine. One dose is recommended after age 68. Talk to your health care provider about which screenings and vaccines you need and how often you need them. This information is not intended to replace advice given to you by your health care provider. Make sure you discuss any questions you have with your health care provider. Document Released: 06/28/2015 Document Revised: 02/19/2016 Document Reviewed: 04/02/2015 Elsevier Interactive Patient Education  2017 [employer]  Fall Prevention in the Home Falls can cause injuries. They can happen to people of all ages. There are many things you can do to make your home safe and to help prevent falls. What can I do on the outside of my home? Regularly fix the edges of walkways and driveways and fix any cracks. Remove anything that might make you trip as you walk through a door, such as a raised step or threshold. Trim any bushes or trees on the path to your home. Use bright outdoor lighting. Clear any walking paths of anything that might make someone trip, such as rocks or tools. Regularly check to see if handrails are loose or broken. Make sure that both sides of any steps have handrails. Any raised decks and porches should have guardrails on the edges. Have any leaves, snow, or ice cleared  regularly. Use sand or salt on walking paths during winter. Clean up any spills in your garage right away. This includes oil or grease spills. What can I do in the bathroom? Use night lights. Install grab bars by the toilet and in the tub and shower. Do not use towel bars as grab bars. Use non-skid mats or decals in the tub or shower. If you need to sit down in the shower, use a plastic, non-slip stool. Keep the floor dry. Clean up any water that spills on the floor as soon as it happens. Remove soap buildup in the tub or shower regularly. Attach bath mats securely with double-sided non-slip rug tape. Do not have throw rugs and other things on the floor that can make you trip. What can I do in the bedroom? Use night lights. Make sure that you have a light by your bed that is easy to reach. Do not use any sheets or blankets that are too big for your bed. They should not hang down onto the floor. Have a firm chair that has side arms. You can use this for support while you get dressed. Do not have throw rugs and other things on the floor that can make you trip. What can I do in the kitchen? Clean up any spills right away. Avoid walking on wet floors. Keep items that you use a lot in easy-to-reach places. If you need to reach something above you, use a strong step stool that has a grab bar. Keep  electrical cords out of the way. Do not use floor polish or wax that makes floors slippery. If you must use wax, use non-skid floor wax. Do not have throw rugs and other things on the floor that can make you trip. What can I do with my stairs? Do not leave any items on the stairs. Make sure that there are handrails on both sides of the stairs and use them. Fix handrails that are broken or loose. Make sure that handrails are as long as the stairways. Check any carpeting to make sure that it is firmly attached to the stairs. Fix any carpet that is loose or worn. Avoid having throw rugs at the top or  bottom of the stairs. If you do have throw rugs, attach them to the floor with carpet tape. Make sure that you have a light switch at the top of the stairs and the bottom of the stairs. If you do not have them, ask someone to add them for you. What else can I do to help prevent falls? Wear shoes that: Do not have high heels. Have rubber bottoms. Are comfortable and fit you well. Are closed at the toe. Do not wear sandals. If you use a stepladder: Make sure that it is fully opened. Do not climb a closed stepladder. Make sure that both sides of the stepladder are locked into place. Ask someone to hold it for you, if possible. Clearly mark and make sure that you can see: Any grab bars or handrails. First and last steps. Where the edge of each step is. Use tools that help you move around (mobility aids) if they are needed. These include: Canes. Walkers. Scooters. Crutches. Turn on the lights when you go into a dark area. Replace any light bulbs as soon as they burn out. Set up your furniture so you have a clear path. Avoid moving your furniture around. If any of your floors are uneven, fix them. If there are any pets around you, be aware of where they are. Review your medicines with your doctor. Some medicines can make you feel dizzy. This can increase your chance of falling. Ask your doctor what other things that you can do to help prevent falls. This information is not intended to replace advice given to you by your health care provider. Make sure you discuss any questions you have with your health care provider. Document Released: 03/28/2009 Document Revised: 11/07/2015 Document Reviewed: 07/06/2014 Elsevier Interactive Patient Education  2017 [employer].

## 2021-11-06 NOTE — Telephone Encounter (Signed)
His hand that he usually always has stretched out is contracted and one foot is dropped like it's heavy and he can't move it. She wonders if he is having a mini stroke, but something similar happened a few weeks ago and she took him to ER, but everything was fine. She wants to know what to do.

## 2021-11-06 NOTE — Telephone Encounter (Signed)
Advised Azzie of Dr. Darrol Poke recommendations. She does not want to take the pt to the ER because all tests were normal yesterday. Offered to make an appt with Dettinger but he does not have anything until later next week. Offered to schedule with another provider. Azzie wants pt to be put on a cancellation list for an appt next week because she cannot come this week. Offered to schedule sooner with another provider but she states that she will call back sooner if she notices a change in the pt or will take him to the ER at that time. Nurse made aware to watch for cancellations.

## 2021-11-07 ENCOUNTER — Telehealth: Payer: Self-pay | Admitting: Family Medicine

## 2021-11-07 NOTE — Telephone Encounter (Signed)
Appt made for WED to discuss

## 2021-11-07 NOTE — Telephone Encounter (Signed)
[name] called to let Dr Dettinger know that pt is using the bathroom all of the time and she believes he needs a colostomy bag or catheter or something.  Needs advise on what to do.

## 2021-11-08 ENCOUNTER — Inpatient Hospital Stay (HOSPITAL_COMMUNITY): Payer: Medicare Other

## 2021-11-08 ENCOUNTER — Emergency Department (HOSPITAL_COMMUNITY): Payer: Medicare Other

## 2021-11-08 ENCOUNTER — Other Ambulatory Visit: Payer: Self-pay

## 2021-11-08 ENCOUNTER — Encounter (HOSPITAL_COMMUNITY): Payer: Self-pay | Admitting: *Deleted

## 2021-11-08 ENCOUNTER — Inpatient Hospital Stay (HOSPITAL_COMMUNITY)
Admission: EM | Admit: 2021-11-08 | Discharge: 2021-12-13 | DRG: 067 | Disposition: E | Payer: Medicare Other | Attending: Emergency Medicine | Admitting: Emergency Medicine

## 2021-11-08 DIAGNOSIS — R531 Weakness: Secondary | ICD-10-CM

## 2021-11-08 DIAGNOSIS — F32A Depression, unspecified: Secondary | ICD-10-CM | POA: Diagnosis present

## 2021-11-08 DIAGNOSIS — J9601 Acute respiratory failure with hypoxia: Secondary | ICD-10-CM | POA: Diagnosis not present

## 2021-11-08 DIAGNOSIS — I639 Cerebral infarction, unspecified: Secondary | ICD-10-CM | POA: Diagnosis not present

## 2021-11-08 DIAGNOSIS — I10 Essential (primary) hypertension: Secondary | ICD-10-CM | POA: Diagnosis present

## 2021-11-08 DIAGNOSIS — F411 Generalized anxiety disorder: Secondary | ICD-10-CM | POA: Diagnosis present

## 2021-11-08 DIAGNOSIS — Z515 Encounter for palliative care: Secondary | ICD-10-CM | POA: Diagnosis not present

## 2021-11-08 DIAGNOSIS — J439 Emphysema, unspecified: Secondary | ICD-10-CM | POA: Diagnosis present

## 2021-11-08 DIAGNOSIS — I2510 Atherosclerotic heart disease of native coronary artery without angina pectoris: Secondary | ICD-10-CM | POA: Diagnosis present

## 2021-11-08 DIAGNOSIS — A413 Sepsis due to Hemophilus influenzae: Secondary | ICD-10-CM | POA: Diagnosis not present

## 2021-11-08 DIAGNOSIS — T4275XA Adverse effect of unspecified antiepileptic and sedative-hypnotic drugs, initial encounter: Secondary | ICD-10-CM | POA: Diagnosis not present

## 2021-11-08 DIAGNOSIS — R413 Other amnesia: Secondary | ICD-10-CM | POA: Diagnosis present

## 2021-11-08 DIAGNOSIS — I25119 Atherosclerotic heart disease of native coronary artery with unspecified angina pectoris: Secondary | ICD-10-CM | POA: Diagnosis not present

## 2021-11-08 DIAGNOSIS — E876 Hypokalemia: Secondary | ICD-10-CM | POA: Diagnosis not present

## 2021-11-08 DIAGNOSIS — I214 Non-ST elevation (NSTEMI) myocardial infarction: Secondary | ICD-10-CM | POA: Diagnosis present

## 2021-11-08 DIAGNOSIS — Z20822 Contact with and (suspected) exposure to covid-19: Secondary | ICD-10-CM | POA: Diagnosis present

## 2021-11-08 DIAGNOSIS — F17210 Nicotine dependence, cigarettes, uncomplicated: Secondary | ICD-10-CM | POA: Diagnosis present

## 2021-11-08 DIAGNOSIS — R29810 Facial weakness: Secondary | ICD-10-CM | POA: Diagnosis present

## 2021-11-08 DIAGNOSIS — Z8679 Personal history of other diseases of the circulatory system: Secondary | ICD-10-CM | POA: Diagnosis not present

## 2021-11-08 DIAGNOSIS — Z681 Body mass index (BMI) 19 or less, adult: Secondary | ICD-10-CM

## 2021-11-08 DIAGNOSIS — L8990 Pressure ulcer of unspecified site, unspecified stage: Secondary | ICD-10-CM | POA: Diagnosis present

## 2021-11-08 DIAGNOSIS — R6521 Severe sepsis with septic shock: Secondary | ICD-10-CM | POA: Diagnosis not present

## 2021-11-08 DIAGNOSIS — G9349 Other encephalopathy: Secondary | ICD-10-CM | POA: Diagnosis present

## 2021-11-08 DIAGNOSIS — D539 Nutritional anemia, unspecified: Secondary | ICD-10-CM | POA: Diagnosis present

## 2021-11-08 DIAGNOSIS — E43 Unspecified severe protein-calorie malnutrition: Secondary | ICD-10-CM | POA: Diagnosis present

## 2021-11-08 DIAGNOSIS — Z79899 Other long term (current) drug therapy: Secondary | ICD-10-CM

## 2021-11-08 DIAGNOSIS — G40909 Epilepsy, unspecified, not intractable, without status epilepticus: Secondary | ICD-10-CM | POA: Diagnosis present

## 2021-11-08 DIAGNOSIS — R479 Unspecified speech disturbances: Secondary | ICD-10-CM | POA: Diagnosis not present

## 2021-11-08 DIAGNOSIS — R339 Retention of urine, unspecified: Secondary | ICD-10-CM | POA: Diagnosis present

## 2021-11-08 DIAGNOSIS — R471 Dysarthria and anarthria: Secondary | ICD-10-CM | POA: Diagnosis not present

## 2021-11-08 DIAGNOSIS — Z7982 Long term (current) use of aspirin: Secondary | ICD-10-CM | POA: Diagnosis not present

## 2021-11-08 DIAGNOSIS — K219 Gastro-esophageal reflux disease without esophagitis: Secondary | ICD-10-CM | POA: Diagnosis present

## 2021-11-08 DIAGNOSIS — R1310 Dysphagia, unspecified: Secondary | ICD-10-CM | POA: Diagnosis present

## 2021-11-08 DIAGNOSIS — I952 Hypotension due to drugs: Secondary | ICD-10-CM | POA: Diagnosis not present

## 2021-11-08 DIAGNOSIS — J14 Pneumonia due to Hemophilus influenzae: Secondary | ICD-10-CM | POA: Diagnosis present

## 2021-11-08 DIAGNOSIS — F1011 Alcohol abuse, in remission: Secondary | ICD-10-CM | POA: Diagnosis present

## 2021-11-08 DIAGNOSIS — E785 Hyperlipidemia, unspecified: Secondary | ICD-10-CM | POA: Diagnosis present

## 2021-11-08 DIAGNOSIS — I6523 Occlusion and stenosis of bilateral carotid arteries: Secondary | ICD-10-CM | POA: Diagnosis present

## 2021-11-08 DIAGNOSIS — J690 Pneumonitis due to inhalation of food and vomit: Secondary | ICD-10-CM | POA: Diagnosis not present

## 2021-11-08 DIAGNOSIS — Z66 Do not resuscitate: Secondary | ICD-10-CM | POA: Diagnosis not present

## 2021-11-08 DIAGNOSIS — Z781 Physical restraint status: Secondary | ICD-10-CM

## 2021-11-08 DIAGNOSIS — E782 Mixed hyperlipidemia: Secondary | ICD-10-CM | POA: Diagnosis not present

## 2021-11-08 DIAGNOSIS — R040 Epistaxis: Secondary | ICD-10-CM | POA: Diagnosis not present

## 2021-11-08 DIAGNOSIS — F339 Major depressive disorder, recurrent, unspecified: Secondary | ICD-10-CM | POA: Diagnosis present

## 2021-11-08 DIAGNOSIS — I472 Ventricular tachycardia, unspecified: Secondary | ICD-10-CM | POA: Diagnosis not present

## 2021-11-08 DIAGNOSIS — I6329 Cerebral infarction due to unspecified occlusion or stenosis of other precerebral arteries: Secondary | ICD-10-CM | POA: Diagnosis not present

## 2021-11-08 DIAGNOSIS — L89152 Pressure ulcer of sacral region, stage 2: Secondary | ICD-10-CM | POA: Diagnosis present

## 2021-11-08 DIAGNOSIS — G4733 Obstructive sleep apnea (adult) (pediatric): Secondary | ICD-10-CM | POA: Diagnosis present

## 2021-11-08 DIAGNOSIS — G459 Transient cerebral ischemic attack, unspecified: Secondary | ICD-10-CM | POA: Diagnosis not present

## 2021-11-08 DIAGNOSIS — R4182 Altered mental status, unspecified: Secondary | ICD-10-CM | POA: Diagnosis not present

## 2021-11-08 DIAGNOSIS — I6389 Other cerebral infarction: Secondary | ICD-10-CM | POA: Diagnosis not present

## 2021-11-08 DIAGNOSIS — R29705 NIHSS score 5: Secondary | ICD-10-CM | POA: Diagnosis present

## 2021-11-08 DIAGNOSIS — I330 Acute and subacute infective endocarditis: Secondary | ICD-10-CM | POA: Diagnosis not present

## 2021-11-08 DIAGNOSIS — R778 Other specified abnormalities of plasma proteins: Secondary | ICD-10-CM | POA: Diagnosis not present

## 2021-11-08 DIAGNOSIS — J449 Chronic obstructive pulmonary disease, unspecified: Secondary | ICD-10-CM | POA: Diagnosis present

## 2021-11-08 HISTORY — DX: Disorder of arteries and arterioles, unspecified: I77.9

## 2021-11-08 LAB — TROPONIN I (HIGH SENSITIVITY)
Troponin I (High Sensitivity): 530 ng/L (ref <18)
Troponin I (High Sensitivity): 561 ng/L (ref <18)
Troponin I (High Sensitivity): 557 ng/L (ref <18)

## 2021-11-08 LAB — CBC
HCT: 38.3 % — ABNORMAL LOW (ref 39.0–52.0)
HCT: 36.1 % — ABNORMAL LOW (ref 39.0–52.0)
Hemoglobin: 12.6 g/dL — ABNORMAL LOW (ref 13.0–17.0)
Hemoglobin: 11.8 g/dL — ABNORMAL LOW (ref 13.0–17.0)
MCH: 33.4 pg (ref 26.0–34.0)
MCH: 33.3 pg (ref 26.0–34.0)
MCHC: 32.9 g/dL (ref 30.0–36.0)
MCHC: 32.7 g/dL (ref 30.0–36.0)
MCV: 101.6 fL — ABNORMAL HIGH (ref 80.0–100.0)
MCV: 102 fL — ABNORMAL HIGH (ref 80.0–100.0)
Platelets: 209 10*3/uL (ref 150–400)
Platelets: 199 10*3/uL (ref 150–400)
RBC: 3.77 MIL/uL — ABNORMAL LOW (ref 4.22–5.81)
RBC: 3.54 MIL/uL — ABNORMAL LOW (ref 4.22–5.81)
RDW: 13.5 % (ref 11.5–15.5)
RDW: 13.5 % (ref 11.5–15.5)
WBC: 14.5 10*3/uL — ABNORMAL HIGH (ref 4.0–10.5)
WBC: 14.3 10*3/uL — ABNORMAL HIGH (ref 4.0–10.5)
nRBC: 0 % (ref 0.0–0.2)
nRBC: 0 % (ref 0.0–0.2)

## 2021-11-08 LAB — HIV ANTIBODY (ROUTINE TESTING W REFLEX): HIV Screen 4th Generation wRfx: NONREACTIVE

## 2021-11-08 LAB — COMPREHENSIVE METABOLIC PANEL
ALT: 22 U/L (ref 0–44)
AST: 32 U/L (ref 15–41)
Albumin: 3.9 g/dL (ref 3.5–5.0)
Alkaline Phosphatase: 69 U/L (ref 38–126)
Anion gap: 5 (ref 5–15)
BUN: 21 mg/dL (ref 8–23)
CO2: 29 mmol/L (ref 22–32)
Calcium: 9.2 mg/dL (ref 8.9–10.3)
Chloride: 102 mmol/L (ref 98–111)
Creatinine, Ser: 0.7 mg/dL (ref 0.61–1.24)
GFR, Estimated: >60 mL/min (ref >60)
Glucose, Bld: 114 mg/dL — ABNORMAL HIGH (ref 70–99)
Potassium: 4.2 mmol/L (ref 3.5–5.1)
Sodium: 136 mmol/L (ref 135–145)
Total Bilirubin: 1 mg/dL (ref 0.3–1.2)
Total Protein: 7.2 g/dL (ref 6.5–8.1)

## 2021-11-08 LAB — DIFFERENTIAL
Abs Immature Granulocytes: 0.04 10*3/uL (ref 0.00–0.07)
Basophils Absolute: 0.1 10*3/uL (ref 0.0–0.1)
Basophils Relative: 1 %
Eosinophils Absolute: 0.1 10*3/uL (ref 0.0–0.5)
Eosinophils Relative: 1 %
Immature Granulocytes: 0 %
Lymphocytes Relative: 8 %
Lymphs Abs: 1.1 10*3/uL (ref 0.7–4.0)
Monocytes Absolute: 1.3 10*3/uL — ABNORMAL HIGH (ref 0.1–1.0)
Monocytes Relative: 9 %
Neutro Abs: 11.9 10*3/uL — ABNORMAL HIGH (ref 1.7–7.7)
Neutrophils Relative %: 81 %

## 2021-11-08 LAB — APTT: aPTT: 24 seconds (ref 24–36)

## 2021-11-08 LAB — I-STAT CHEM 8, ED
BUN: 21 mg/dL (ref 8–23)
Calcium, Ion: 1.2 mmol/L (ref 1.15–1.40)
Chloride: 98 mmol/L (ref 98–111)
Creatinine, Ser: 0.6 mg/dL — ABNORMAL LOW (ref 0.61–1.24)
Glucose, Bld: 112 mg/dL — ABNORMAL HIGH (ref 70–99)
HCT: 41 % (ref 39.0–52.0)
Hemoglobin: 13.9 g/dL (ref 13.0–17.0)
Potassium: 4.2 mmol/L (ref 3.5–5.1)
Sodium: 136 mmol/L (ref 135–145)
TCO2: 30 mmol/L (ref 22–32)

## 2021-11-08 LAB — URINALYSIS, ROUTINE W REFLEX MICROSCOPIC
Bilirubin Urine: NEGATIVE
Glucose, UA: NEGATIVE mg/dL
Ketones, ur: 20 mg/dL — AB
Leukocytes,Ua: NEGATIVE
Nitrite: NEGATIVE
Protein, ur: 30 mg/dL — AB
Specific Gravity, Urine: 1.025 (ref 1.005–1.030)
pH: 5 (ref 5.0–8.0)

## 2021-11-08 LAB — RESP PANEL BY RT-PCR (FLU A&B, COVID) ARPGX2
Influenza A by PCR: NEGATIVE
Influenza B by PCR: NEGATIVE
SARS Coronavirus 2 by RT PCR: NEGATIVE

## 2021-11-08 LAB — LIPID PANEL
Cholesterol: 127 mg/dL (ref 0–200)
HDL: 64 mg/dL (ref >40)
LDL Cholesterol: 56 mg/dL (ref 0–99)
Total CHOL/HDL Ratio: 2 RATIO
Triglycerides: 34 mg/dL (ref <150)
VLDL: 7 mg/dL (ref 0–40)

## 2021-11-08 LAB — RAPID URINE DRUG SCREEN, HOSP PERFORMED
Amphetamines: NOT DETECTED
Barbiturates: NOT DETECTED
Benzodiazepines: NOT DETECTED
Cocaine: NOT DETECTED
Opiates: NOT DETECTED
Tetrahydrocannabinol: NOT DETECTED

## 2021-11-08 LAB — ETHANOL: Alcohol, Ethyl (B): <10 mg/dL (ref <10)

## 2021-11-08 LAB — PROTIME-INR
INR: 1.1 (ref 0.8–1.2)
Prothrombin Time: 14.2 s (ref 11.4–15.2)

## 2021-11-08 MED ORDER — ACETAMINOPHEN 325 MG PO TABS: 650.0000 mg | ORAL_TABLET | Freq: Four times a day (QID) | ORAL | Status: DC | PRN

## 2021-11-08 MED ORDER — CLOPIDOGREL BISULFATE 75 MG PO TABS
75.0000 mg | ORAL_TABLET | Freq: Every day | ORAL | Status: DC
  Administered 2021-11-08 – 2021-11-09 (×2): 75 mg via ORAL
  Filled 2021-11-08 (×2): qty 1

## 2021-11-08 MED ORDER — ACETAMINOPHEN 650 MG RE SUPP: 650.0000 mg | Freq: Four times a day (QID) | RECTAL | Status: DC | PRN

## 2021-11-08 MED ORDER — HEPARIN SODIUM (PORCINE) 5000 UNIT/ML IJ SOLN
5000.0000 [IU] | Freq: Three times a day (TID) | INTRAMUSCULAR | Status: DC
  Filled 2021-11-08: qty 1

## 2021-11-08 MED ORDER — SODIUM CHLORIDE 0.9% FLUSH: 3.0000 mL | INTRAVENOUS | Status: DC | PRN

## 2021-11-08 MED ORDER — SODIUM CHLORIDE 0.9 % IV SOLN: INTRAVENOUS | Status: DC

## 2021-11-08 MED ORDER — ONDANSETRON HCL 4 MG PO TABS: 4.0000 mg | ORAL_TABLET | Freq: Four times a day (QID) | ORAL | Status: DC | PRN

## 2021-11-08 MED ORDER — PANTOPRAZOLE SODIUM 40 MG PO TBEC
40.0000 mg | DELAYED_RELEASE_TABLET | Freq: Every day | ORAL | Status: DC
Start: 2021-11-09 — End: 2021-11-10
  Administered 2021-11-09: 40 mg via ORAL
  Filled 2021-11-08: qty 1

## 2021-11-08 MED ORDER — ASPIRIN 325 MG PO TABS
325.0000 mg | ORAL_TABLET | Freq: Every day | ORAL | Status: DC
  Administered 2021-11-09: 325 mg via ORAL
  Filled 2021-11-08: qty 1

## 2021-11-08 MED ORDER — BISACODYL 5 MG PO TBEC: 5.0000 mg | DELAYED_RELEASE_TABLET | Freq: Every day | ORAL | Status: DC | PRN

## 2021-11-08 MED ORDER — BUSPIRONE HCL 10 MG PO TABS
10.0000 mg | ORAL_TABLET | Freq: Three times a day (TID) | ORAL | Status: DC
  Administered 2021-11-08 – 2021-11-09 (×4): 10 mg via ORAL
  Filled 2021-11-08 (×4): qty 2
  Filled 2021-11-08 (×2): qty 1
  Filled 2021-11-08: qty 2

## 2021-11-08 MED ORDER — ATORVASTATIN CALCIUM 40 MG PO TABS: 80.0000 mg | ORAL_TABLET | Freq: Every day | ORAL | Status: DC

## 2021-11-08 MED ORDER — RISPERIDONE 0.5 MG PO TABS
2.0000 mg | ORAL_TABLET | Freq: Every day | ORAL | Status: DC
  Administered 2021-11-08: 2 mg via ORAL
  Filled 2021-11-08 (×2): qty 4

## 2021-11-08 MED ORDER — SODIUM CHLORIDE 0.9% FLUSH
3.0000 mL | Freq: Two times a day (BID) | INTRAVENOUS | Status: DC
  Administered 2021-11-08 – 2021-11-14 (×10): 3 mL via INTRAVENOUS

## 2021-11-08 MED ORDER — IOHEXOL 350 MG/ML SOLN
75.0000 mL | Freq: Once | INTRAVENOUS | Status: AC | PRN
  Administered 2021-11-08: 75 mL via INTRAVENOUS

## 2021-11-08 MED ORDER — NITROGLYCERIN 0.4 MG SL SUBL: 0.4000 mg | SUBLINGUAL_TABLET | SUBLINGUAL | Status: DC | PRN

## 2021-11-08 MED ORDER — SENNOSIDES-DOCUSATE SODIUM 8.6-50 MG PO TABS: 1.0000 | ORAL_TABLET | Freq: Every evening | ORAL | Status: DC | PRN

## 2021-11-08 MED ORDER — SIMVASTATIN 20 MG PO TABS
40.0000 mg | ORAL_TABLET | Freq: Every day | ORAL | Status: DC
  Filled 2021-11-08: qty 2

## 2021-11-08 MED ORDER — SODIUM CHLORIDE 0.9% FLUSH
3.0000 mL | Freq: Two times a day (BID) | INTRAVENOUS | Status: DC
  Administered 2021-11-08: 3 mL via INTRAVENOUS

## 2021-11-08 MED ORDER — TRAZODONE HCL 50 MG PO TABS: 25.0000 mg | ORAL_TABLET | Freq: Every evening | ORAL | Status: DC | PRN

## 2021-11-08 MED ORDER — HEPARIN (PORCINE) 25000 UT/250ML-% IV SOLN
1300.0000 [IU]/h | INTRAVENOUS | Status: DC
  Administered 2021-11-08: 700 [IU]/h via INTRAVENOUS
  Administered 2021-11-09: 1000 [IU]/h via INTRAVENOUS
  Administered 2021-11-10: 1150 [IU]/h via INTRAVENOUS
  Filled 2021-11-08 (×3): qty 250

## 2021-11-08 MED ORDER — HYDROMORPHONE HCL 1 MG/ML IJ SOLN: 0.5000 mg | INTRAMUSCULAR | Status: DC | PRN

## 2021-11-08 MED ORDER — PAROXETINE HCL 20 MG PO TABS
20.0000 mg | ORAL_TABLET | Freq: Every day | ORAL | Status: DC
  Administered 2021-11-09: 20 mg via ORAL
  Filled 2021-11-08 (×2): qty 1

## 2021-11-08 MED ORDER — OXYCODONE HCL 5 MG PO TABS: 5.0000 mg | ORAL_TABLET | ORAL | Status: DC | PRN

## 2021-11-08 MED ORDER — MELATONIN 3 MG PO TABS
9.0000 mg | ORAL_TABLET | Freq: Every day | ORAL | Status: DC
  Administered 2021-11-08: 9 mg via ORAL
  Filled 2021-11-08 (×2): qty 3

## 2021-11-08 MED ORDER — ONDANSETRON HCL 4 MG/2ML IJ SOLN: 4.0000 mg | Freq: Four times a day (QID) | INTRAMUSCULAR | Status: DC | PRN

## 2021-11-08 MED ORDER — ASPIRIN 81 MG PO CHEW: 324.0000 mg | CHEWABLE_TABLET | Freq: Once | ORAL | Status: DC

## 2021-11-08 MED ORDER — SODIUM CHLORIDE 0.9 % IV SOLN: 250.0000 mL | INTRAVENOUS | Status: DC | PRN

## 2021-11-08 MED ORDER — PRAMIPEXOLE DIHYDROCHLORIDE 1 MG PO TABS
1.0000 mg | ORAL_TABLET | Freq: Every day | ORAL | Status: DC
  Filled 2021-11-08 (×2): qty 1

## 2021-11-08 MED ORDER — HYDRALAZINE HCL 20 MG/ML IJ SOLN: 10.0000 mg | INTRAMUSCULAR | Status: DC | PRN

## 2021-11-08 MED ORDER — LEVALBUTEROL HCL 0.63 MG/3ML IN NEBU: 0.6300 mg | INHALATION_SOLUTION | Freq: Four times a day (QID) | RESPIRATORY_TRACT | Status: DC | PRN

## 2021-11-08 MED ORDER — MIRABEGRON ER 25 MG PO TB24
25.0000 mg | ORAL_TABLET | Freq: Every day | ORAL | Status: DC
  Administered 2021-11-08 – 2021-11-09 (×2): 25 mg via ORAL
  Filled 2021-11-08 (×3): qty 1

## 2021-11-08 MED ORDER — IPRATROPIUM BROMIDE 0.02 % IN SOLN: 0.5000 mg | Freq: Four times a day (QID) | RESPIRATORY_TRACT | Status: DC | PRN

## 2021-11-08 NOTE — Assessment & Plan Note (Signed)
-   Resuming high-dose statins -Obtaining fasting lab panel

## 2021-11-08 NOTE — Progress Notes (Signed)
ANTICOAGULATION CONSULT NOTE - Initial Consult  Pharmacy Consult for heparin Indication: chest pain/ACS  Allergies  Allergen Reactions   Codeine Rash    Patient Measurements: Height: 5\' 7"  (170.2 cm) Weight: 57.3 kg (126 lb 5.2 oz) IBW/kg (Calculated) : 66.1 Heparin Dosing Weight: 57kg  Vital Signs: Temp: 97.9 F (36.6 C) (05/27 2000) Temp Source: Oral (05/27 2000) BP: 125/50 (05/27 2000) Pulse Rate: 87 (05/27 2000)  Labs: Recent Labs    11/02/2021 1149 11/07/2021 1157 11/11/2021 1245 10/16/2021 1445 10/18/2021 1554  HGB 12.6* 13.9  --  11.8*  --   HCT 38.3* 41.0  --  36.1*  --   PLT 209  --   --  199  --   APTT 24  --   --   --   --   LABPROT 14.2  --   --   --   --   INR 1.1  --   --   --   --   CREATININE 0.70 0.60*  --   --   --   TROPONINIHS  --   --  530* 561* 557*    Estimated Creatinine Clearance: 72.6 mL/min (A) (by C-G formula based on SCr of 0.6 mg/dL (L)).   Medical History: Past Medical History:  Diagnosis Date   Anxiety    Cerebral aneurysm    Closed head injury    COPD (chronic obstructive pulmonary disease) (HCC)    Depression    GERD (gastroesophageal reflux disease)    Hyperlipidemia    Hypertension    Seizures (HCC)    Sleep apnea    Assessment: 66 year old male transferred from [facility] to [facility] for stroke and cardiac work up. Patient with negative CT and MRI for bleeding. Positive troponins to start IV heparin. CBC within normal limits, not on anticoagulants prior to admission.   Heparin level 0.3-0.5 units/ml given code stroke on admit Monitor platelets by anticoagulation protocol: Yes   Plan:  Start heparin infusion at 700 units/hr Check anti-Xa level in 8 hours and daily while on heparin Continue to monitor H&H and platelets  Erin Hearing PharmD., BCPS Clinical Pharmacist 11/06/2021 10:04 PM

## 2021-11-08 NOTE — Assessment & Plan Note (Signed)
Currently stable, no signs of exacerbation -As needed DuoNeb, oxygen supplement

## 2021-11-08 NOTE — Assessment & Plan Note (Signed)
-   Right-sided weakness with slurred speech approximately 23 hours ago -Teleneurology was consulted Dr. Quinn Axe has evaluated the patient patient is not a tPA candidate -Recommend stroke work-up -Pending MRI of the brain --- will be done at [facility] -2D echocardiogram -Every 4 neurochecks -Labs A1c, lipid panel, -Consulting PT/OT/speech -Per neurology recommendation aspirin plus Plavix for 21 days then aspirin 81 mg daily (we may hold off Plavix as patient may need heparin drip for non-STEMI-cardiac intervention)

## 2021-11-08 NOTE — Consult Note (Signed)
NEUROLOGY TELECONSULTATION NOTE   Date of service: Nov 08, 2021 Patient Name: [name] MRN:  [id number] DOB:  08/07/1955 Reason for consult: stroke code  Requesting Provider: Dr. Noemi Chapel Consult Participants: myself, patient, bedside RN, telestroke RN Location of the provider: [facility] Location of the patient: [facility]  This consult was provided via telemedicine with 2-way video and audio communication. The patient/family was informed that care would be provided in this way and agreed to receive care in this manner.   _ _ _   _ __   _ __ _ _  __ __   _ __   __ _  History of Present Illness   This is a 66 year old gentleman with past medical history significant for anxiety, TBI, cerebral aneurysm, seizures, COPD, hyperlipidemia, hypertension, sleep apnea who presents with dysarthria and leaning to the right since yesterday.  Last known well 23 hours ago at 1 PM.  Patient is a poor historian and difficult to understand over telemetry examination however he states that he has been feeling weak in both his legs since yesterday and his speech is more slurred than usual. NIHSS = 5. CT head NAICP personal review. TNK was not administered 2/2 presentation outside of the window. Exam not c/w LVO therefore vascular imaging was not performed as part of the stroke code.   ROS   Per HPI; all other systems reviewed and are negative  Past History   The following was personally reviewed:  Past Medical History:  Diagnosis Date   Anxiety    Cerebral aneurysm    Closed head injury    COPD (chronic obstructive pulmonary disease) (HCC)    Depression    GERD (gastroesophageal reflux disease)    Hyperlipidemia    Hypertension    Seizures ([place])    Sleep apnea    History reviewed. No pertinent surgical history. Family History  Problem Relation Age of Onset   Obesity Sister    Social History   Socioeconomic History   Marital status: Single    Spouse name: Not on file   Number of  children: 1   Years of education: Not on file   Highest education level: Not on file  Occupational History   Occupation: disabled  Tobacco Use   Smoking status: Every Day    Packs/day: 1.00    Years: 50.00    Pack years: 50.00    Types: Cigarettes   Smokeless tobacco: Never  Vaping Use   Vaping Use: Never used  Substance and Sexual Activity   Alcohol use: No   Drug use: Yes    Types: Marijuana   Sexual activity: Not Currently  Other Topics Concern   Not on file  Social History Narrative   He lives with his sister. He has one son that he doesn't get along with very well.   Patient had a stroke that left him with speech difficulties and high fall risk   Social Determinants of Health   Financial Resource Strain: Low Risk    Difficulty of Paying Living Expenses: Not hard at all  Food Insecurity: No Food Insecurity   Worried About Charity fundraiser in the Last Year: Never true   Ran Out of Food in the Last Year: Never true  Transportation Needs: No Transportation Needs   Lack of Transportation (Medical): No   Lack of Transportation (Non-Medical): No  Physical Activity: Inactive   Days of Exercise per Week: 0 days   Minutes of Exercise per Session:  0 min  Stress: No Stress Concern Present   Feeling of Stress : Not at all  Social Connections: Socially Isolated   Frequency of Communication with Friends and Family: Never   Frequency of Social Gatherings with Friends and Family: Twice a week   Attends Religious Services: Never   Marine scientist or Organizations: No   Attends Archivist Meetings: Never   Marital Status: Never married   Allergies  Allergen Reactions   Clonidine Rash    Medications   (Not in a hospital admission)    No current facility-administered medications for this encounter.  Current Outpatient Medications:    albuterol (VENTOLIN HFA) 108 (90 Base) MCG/ACT inhaler, 2 PUFFS EVERY 6 HOURS AS NEEDED FOR WHEEZING OR SHORTNESS OF  BREATH (Patient not taking: Reported on 11/06/2021), Disp: 18 g, Rfl: 1   busPIRone (BUSPAR) 10 MG tablet, TAKE ONE TABLET THREE TIMES DAILY, Disp: 90 tablet, Rfl: 1   Cyanocobalamin (KP VITAMIN B-12 PO), Take 1 tablet by mouth daily., Disp: , Rfl:    doxycycline (VIBRA-TABS) 100 MG tablet, Take 1 tablet (100 mg total) by mouth 2 (two) times daily. 1 po bid (Patient not taking: Reported on 11/06/2021), Disp: 20 tablet, Rfl: 0   feeding supplement, ENSURE COMPLETE, (ENSURE COMPLETE) LIQD, Take 237 mLs by mouth daily., Disp: 7110 mL, Rfl: 11   folic acid (FOLVITE) 1 MG tablet, Take 1 tablet (1 mg total) by mouth daily., Disp: 30 tablet, Rfl: 2   ipratropium-albuterol (DUONEB) 0.5-2.5 (3) MG/3ML SOLN, INHALE 3 MLS BY NEBULIZER 4 TIMES A DAY, Disp: 360 mL, Rfl: 0   Melatonin 10 MG TABS, Take 10 mg by mouth at bedtime. (Patient taking differently: Take 10-20 mg by mouth at bedtime.), Disp: 90 tablet, Rfl: 3   mupirocin ointment (BACTROBAN) 2 %, PLACE 1 APPLICATION INTO THE NOSE 2 TIMES A DAY, Disp: 22 g, Rfl: 5   MYRBETRIQ 25 MG TB24 tablet, TAKE ONE TABLET ONCE DAILY, Disp: 30 tablet, Rfl: 5   niacin (NIASPAN) 1000 MG CR tablet, TAKE 1 TABLET BY MOUTH AT BEDTIME., Disp: 90 tablet, Rfl: 0   Nystatin (GERHARDT'S BUTT CREAM) CREA, Apply 1 application topically daily. Equal parts nystatin and hydrocortisone and zinc oxide, Disp: 1 each, Rfl: 2   omeprazole (PRILOSEC) 40 MG capsule, TAKE (1) CAPSULE DAILY, Disp: 90 capsule, Rfl: 0   PARoxetine (PAXIL) 20 MG tablet, TAKE ONE TABLET ONCE DAILY, Disp: 90 tablet, Rfl: 0   potassium chloride (KLOR-CON) 10 MEQ tablet, Take 1 tablet (10 mEq total) by mouth daily., Disp: 6 tablet, Rfl: 0   pramipexole (MIRAPEX) 0.5 MG tablet, Take 2 tablets (1 mg total) by mouth at bedtime., Disp: 90 tablet, Rfl: 3   risperiDONE (RISPERDAL) 2 MG tablet, Take 1 tablet (2 mg total) by mouth at bedtime., Disp: 90 tablet, Rfl: 3   simvastatin (ZOCOR) 20 MG tablet, TAKE ONE TABLET AT  BEDTIME, Disp: 90 tablet, Rfl: 0   thiamine 100 MG tablet, Take 1 tablet (100 mg total) by mouth daily., Disp: 30 tablet, Rfl: 2  Vitals   There were no vitals filed for this visit.   There is no height or weight on file to calculate BMI.  Physical Exam   Exam performed over telemedicine with 2-way video and audio communication and with assistance of bedside RN  Physical Exam Gen: lying in bed, NAD Resp: normal WOB CV: extremities appear well-perfused  Neuro: *MS: alert, oriented to self and hospital and age but not  month. Follows multi-step commands.  *Speech: moderate dysarthria, no aphasia, able to name and repeat *CN: PERRL 4mm, EOMI, VFF by confrontation in L eye but not R, sensation intact, L facial droop hearing intact to voice *Motor:   Normal bulk.  No tremor, rigidity or bradykinesia. Subtle drift in LLE, able to hold all other extremities up without drift.  *Sensory: SILT. No double-simultaneous extinction.  *Coordination:  FNF intact bilat *Reflexes:  UTA 2/2 tele-exam *Gait: deferred  NIHSS  1a Level of Conscious.: 0 1b LOC Questions: 1 1c LOC Commands: 0 2 Best Gaze: 0 3 Visual: 1 4 Facial Palsy: 1 5a Motor Arm - left: 0 5b Motor Arm - Right: 0 6a Motor Leg - Left: 1 6b Motor Leg - Right: 0 7 Limb Ataxia: 0 8 Sensory: 0 9 Best Language: 0 10 Dysarthria: 1 11 Extinct. and Inatten.: 0  TOTAL: 5   Premorbid mRS = 1   Labs   CBC:  Recent Labs  Lab 10/31/2021 1149 10/16/2021 1157  WBC 14.5*  --   NEUTROABS 11.9*  --   HGB 12.6* 13.9  HCT 38.3* 41.0  MCV 101.6*  --   PLT 209  --     Basic Metabolic Panel:  Lab Results  Component Value Date   NA 136 11/09/2021   K 4.2 10/26/2021   CO2 29 10/18/2021   GLUCOSE 112 (H) 11/07/2021   BUN 21 10/22/2021   CREATININE 0.60 (L) 11/03/2021   CALCIUM 9.2 10/21/2021   GFRNONAA >60 10/14/2021   GFRAA >60 02/03/2020   Lipid Panel:  Lab Results  Component Value Date   LDLCALC 52 10/15/2021    HgbA1c:  Lab Results  Component Value Date   HGBA1C 6.1 (H) 02/03/2020   Urine Drug Screen:     Component Value Date/Time   LABOPIA NONE DETECTED 02/03/2020 0736   COCAINSCRNUR NONE DETECTED 02/03/2020 0736   LABBENZ NONE DETECTED 02/03/2020 0736   AMPHETMU NONE DETECTED 02/03/2020 0736   THCU NONE DETECTED 02/03/2020 0736   LABBARB NONE DETECTED 02/03/2020 0736    Alcohol Level     Component Value Date/Time   ETH <10 07/09/2019 1517     Impression   This is a 66 year old gentleman with past medical history significant for anxiety, TBI, cerebral aneurysm, seizures, COPD, hyperlipidemia, hypertension, sleep apnea who presents with dysarthria and leaning to the right since yesterday c/f possible acute ischemic stroke. TNK not administered 2/2 presentation outside the window.  Recommendations   - Admit to [facility] for stroke workup - Permissive HTN x48 hrs from sx onset or until stroke ruled out by MRI goal BP <220/110. PRN labetalol or hydralazine if BP above these parameters. Avoid oral antihypertensives. - MRI brain wo contrast - CTA or MRA H&N - TTE w/ bubble - Check A1c and LDL + add statin per guidelines - ASA 81mg  daily + plavix 75mg  daily x21 days f/b ASA 81mg  daily monotherapy after that - q4 hr neuro checks - STAT head CT for any change in neuro exam - Tele - PT/OT/SLP - Stroke education - Amb referral to neurology upon discharge  ______________________________________________________________________   Thank you for the opportunity to take part in the care of this patient. If you have any further questions, please contact the neurology consultation attending.  Signed,  Su Monks, MD [facility] [phone]  If 7pm- 7am, please page neurology on call as listed in [place].

## 2021-11-08 NOTE — Assessment & Plan Note (Signed)
-   Currently stable, CT of the head unremarkable -MRI of the brain

## 2021-11-08 NOTE — ED Provider Notes (Signed)
[facility] EMERGENCY DEPARTMENT Provider Note   CSN: [id number] Arrival date & time: 10/17/2021  1147  An emergency department physician performed an initial assessment on this suspected stroke patient at 1147.  History  Chief Complaint  Patient presents with   Code Stroke    [name] is a 66 y.o. male.  HPI  This patient is a 66 year old male, he has a known history of a prior stroke which she states was about 2 years ago.  He is currently taking Myrbetriq, buspirone, paroxetine, omeprazole, simvastatin and risperidone.  He has no allergies to medications that he is aware of.  According to the family members the patient was last seen normal around yesterday at 1:00 in the afternoon when he was having difficulty walking because of right-sided weakness.  The report from family to the paramedics was that he had had prior right-sided weakness from his stroke but it was not to this degree.  They also reported some difficulty speaking compared to baseline.  The paramedics noted that he was answering their questions appropriately but did have some obvious right-sided deficits including right-sided facial droop.  Code stroke was activated given that his symptoms have been going on for about 22-1/2 hours at the time of activation and 23 hours by the time of arrival to the ER.  Home Medications Prior to Admission medications   Medication Sig Start Date End Date Taking? Authorizing Provider  albuterol (VENTOLIN HFA) 108 (90 Base) MCG/ACT inhaler 2 PUFFS EVERY 6 HOURS AS NEEDED FOR WHEEZING OR SHORTNESS OF BREATH Patient not taking: Reported on 11/06/2021 11/20/19   Dettinger, Fransisca Kaufmann, MD  busPIRone (BUSPAR) 10 MG tablet TAKE ONE TABLET THREE TIMES DAILY 08/26/21   Dettinger, Fransisca Kaufmann, MD  Cyanocobalamin (KP VITAMIN B-12 PO) Take 1 tablet by mouth daily.    [provider]  doxycycline (VIBRA-TABS) 100 MG tablet Take 1 tablet (100 mg total) by mouth 2 (two) times daily. 1 po  bid Patient not taking: Reported on 11/06/2021 03/21/21   Dettinger, Fransisca Kaufmann, MD  feeding supplement, ENSURE COMPLETE, (ENSURE COMPLETE) LIQD Take 237 mLs by mouth daily. 09/19/20   Dettinger, Fransisca Kaufmann, MD  folic acid (FOLVITE) 1 MG tablet Take 1 tablet (1 mg total) by mouth daily. 12/02/19   Emokpae, Courage, MD  ipratropium-albuterol (DUONEB) 0.5-2.5 (3) MG/3ML SOLN INHALE 3 MLS BY NEBULIZER 4 TIMES A DAY 09/22/21   Dettinger, Fransisca Kaufmann, MD  Melatonin 10 MG TABS Take 10 mg by mouth at bedtime. Patient taking differently: Take 10-20 mg by mouth at bedtime. 08/09/19   Dettinger, Fransisca Kaufmann, MD  mupirocin ointment (BACTROBAN) 2 % PLACE 1 APPLICATION INTO THE NOSE 2 TIMES A DAY 03/13/20   Dettinger, Fransisca Kaufmann, MD  MYRBETRIQ 25 MG TB24 tablet TAKE ONE TABLET ONCE DAILY 10/27/21   Dettinger, Fransisca Kaufmann, MD  niacin (NIASPAN) 1000 MG CR tablet TAKE 1 TABLET BY MOUTH AT BEDTIME. 08/26/21   Dettinger, Fransisca Kaufmann, MD  Nystatin (GERHARDT'S BUTT CREAM) CREA Apply 1 application topically daily. Equal parts nystatin and hydrocortisone and zinc oxide 03/15/20   Dettinger, Fransisca Kaufmann, MD  omeprazole (PRILOSEC) 40 MG capsule TAKE (1) CAPSULE DAILY 08/26/21   Dettinger, Fransisca Kaufmann, MD  PARoxetine (PAXIL) 20 MG tablet TAKE ONE TABLET ONCE DAILY 08/26/21   Dettinger, Fransisca Kaufmann, MD  potassium chloride (KLOR-CON) 10 MEQ tablet Take 1 tablet (10 mEq total) by mouth daily. 12/03/19   Milton Ferguson, MD  pramipexole (MIRAPEX) 0.5 MG tablet Take 2  tablets (1 mg total) by mouth at bedtime. 09/19/20   Dettinger, Elige Radon, MD  risperiDONE (RISPERDAL) 2 MG tablet Take 1 tablet (2 mg total) by mouth at bedtime. 09/19/20   Dettinger, Elige Radon, MD  simvastatin (ZOCOR) 20 MG tablet TAKE ONE TABLET AT BEDTIME 08/26/21   Dettinger, Elige Radon, MD  thiamine 100 MG tablet Take 1 tablet (100 mg total) by mouth daily. 12/02/19   Shon Hale, MD      Allergies    Clonidine    Review of Systems   Review of Systems  All other systems reviewed and are  negative.  Physical Exam Updated Vital Signs BP (!) 120/50   Pulse 67   Temp 97.6 F (36.4 C) (Oral)   Resp (!) 22   Ht 1.702 m (5\' 7" )   Wt 56.7 kg   SpO2 97%   BMI 19.58 kg/m  Physical Exam Vitals and nursing note reviewed.  Constitutional:      General: He is not in acute distress.    Appearance: He is well-developed.  HENT:     Head: Normocephalic and atraumatic.     Mouth/Throat:     Pharynx: No oropharyngeal exudate.  Eyes:     General: No scleral icterus.       Right eye: No discharge.        Left eye: No discharge.     Conjunctiva/sclera: Conjunctivae normal.     Pupils: Pupils are equal, round, and reactive to light.  Neck:     Thyroid: No thyromegaly.     Vascular: No JVD.  Cardiovascular:     Rate and Rhythm: Normal rate and regular rhythm.     Heart sounds: Normal heart sounds. No murmur heard.   No friction rub. No gallop.  Pulmonary:     Effort: Pulmonary effort is normal. No respiratory distress.     Breath sounds: Normal breath sounds. No wheezing or rales.  Abdominal:     General: Bowel sounds are normal. There is no distension.     Palpations: Abdomen is soft. There is no mass.     Tenderness: There is no abdominal tenderness.  Musculoskeletal:        General: No tenderness. Normal range of motion.     Cervical back: Normal range of motion and neck supple.     Right lower leg: No edema.     Left lower leg: No edema.  Lymphadenopathy:     Cervical: No cervical adenopathy.  Skin:    General: Skin is warm and dry.     Findings: No erythema or rash.  Neurological:     Mental Status: He is alert.     Coordination: Coordination normal.     Comments: The patient has a slight expressive aphasia with slurred words but is able to say all the right answers propria words, he has slight right-sided weakness of arm and leg and seems to be slumped over to the right side a little bit.  He does have a right-sided facial droop.  He has some difficulty with  finger-nose-finger on the right hand but is able to perform it generally well and has no obvious weakness when he lifts his arms and legs.  Psychiatric:        Behavior: Behavior normal.    ED Results / Procedures / Treatments   Labs (all labs ordered are listed, but only abnormal results are displayed) Labs Reviewed  CBC - Abnormal; Notable for the following components:  Result Value   WBC 14.5 (*)    RBC 3.77 (*)    Hemoglobin 12.6 (*)    HCT 38.3 (*)    MCV 101.6 (*)    All other components within normal limits  DIFFERENTIAL - Abnormal; Notable for the following components:   Neutro Abs 11.9 (*)    Monocytes Absolute 1.3 (*)    All other components within normal limits  COMPREHENSIVE METABOLIC PANEL - Abnormal; Notable for the following components:   Glucose, Bld 114 (*)    All other components within normal limits  I-STAT CHEM 8, ED - Abnormal; Notable for the following components:   Creatinine, Ser 0.60 (*)    Glucose, Bld 112 (*)    All other components within normal limits  TROPONIN I (HIGH SENSITIVITY) - Abnormal; Notable for the following components:   Troponin I (High Sensitivity) 530 (*)    All other components within normal limits  RESP PANEL BY RT-PCR (FLU A&B, COVID) ARPGX2  PROTIME-INR  APTT  ETHANOL  RAPID URINE DRUG SCREEN, HOSP PERFORMED  URINALYSIS, ROUTINE W REFLEX MICROSCOPIC    EKG EKG Interpretation  Date/Time:  Saturday Nov 08 2021 12:31:42 EDT Ventricular Rate:  66 PR Interval:  143 QRS Duration: 107 QT Interval:  424 QTC Calculation: 445 R Axis:   70 Text Interpretation: Sinus rhythm Left atrial enlargement Left ventricular hypertrophy Repol abnrm suggests ischemia, inferior leads Anterior ST elevation, probably due to LVH similar to 02/03/20 Confirmed by Noemi Chapel [phone]) on 10/18/2021 12:34:22 PM  Radiology CT HEAD CODE STROKE WO CONTRAST  Result Date: 10/16/2021 CLINICAL DATA:  Code stroke. Stroke suspected. Right-sided weakness  for 23 hours EXAM: CT HEAD WITHOUT CONTRAST TECHNIQUE: Contiguous axial images were obtained from the base of the skull through the vertex without intravenous contrast. RADIATION DOSE REDUCTION: This exam was performed according to the departmental dose-optimization program which includes automated exposure control, adjustment of the mA and/or kV according to patient size and/or use of iterative reconstruction technique. COMPARISON:  09/14/2021 FINDINGS: Brain: No evidence of acute infarction, hemorrhage, hydrocephalus, extra-axial collection or mass lesion/mass effect. Multiple chronic lacunar infarcts at the bilateral deep gray nuclei, especially the bilateral thalamus. 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 Premature chronic small vessel ischemia. Confluence clinic gliosis in the cerebral white matter. Vascular: No hyperdense vessel or unexpected calcification. Skull: Normal. Negative for fracture or focal lesion. Prior burr holes on the left. Sinuses/Orbits: Chronic bilateral maxillary sinusitis with left more than right opacification and sclerotic wall thickening. ASPECTS [facility] Stroke Program Early CT Score) - Ganglionic level infarction (caudate, lentiform nuclei, internal capsule, insula, M1-M3 cortex): 7 - Supraganglionic infarction (M4-M6 cortex): 3 Total score (0-10 with 10 being normal): 10 IMPRESSION: 1. No acute finding. 2. Advanced chronic small vessel ischemia. Generalized cerebral atrophy. 3. Chronic sinusitis. Electronically Signed   By: Jorje Guild M.D.   On: 10/14/2021 12:04    Procedures .Critical Care Performed by: Noemi Chapel, MD Authorized by: Noemi Chapel, MD   Critical care provider statement:    Critical care time (minutes):  45   Critical care time was exclusive of:  Separately billable procedures and treating other patients and teaching time   Critical care was necessary to treat or prevent imminent or life-threatening deterioration of the  following conditions:  Cardiac failure and CNS failure or compromise   Critical care was time spent personally by me on the following activities:  Development of treatment plan with patient or surrogate, discussions  with consultants, evaluation of patient's response to treatment, examination of patient, ordering and review of laboratory studies, ordering and review of radiographic studies, ordering and performing treatments and interventions, pulse oximetry, re-evaluation of patient's condition and review of old charts   I assumed direction of critical care for this patient from another provider in my specialty: no     Care discussed with: admitting provider and accepting provider at another facility   Comments:          Medications Ordered in ED Medications  heparin injection 5,000 Units (has no administration in time range)  sodium chloride flush (NS) 0.9 % injection 3 mL (has no administration in time range)  0.9 %  sodium chloride infusion (has no administration in time range)  sodium chloride flush (NS) 0.9 % injection 3 mL (has no administration in time range)  sodium chloride flush (NS) 0.9 % injection 3 mL (has no administration in time range)  0.9 %  sodium chloride infusion (has no administration in time range)  acetaminophen (TYLENOL) tablet 650 mg (has no administration in time range)    Or  acetaminophen (TYLENOL) suppository 650 mg (has no administration in time range)  oxyCODONE (Oxy IR/ROXICODONE) immediate release tablet 5 mg (has no administration in time range)  HYDROmorphone (DILAUDID) injection 0.5-1 mg (has no administration in time range)  traZODone (DESYREL) tablet 25 mg (has no administration in time range)  senna-docusate (Senokot-S) tablet 1 tablet (has no administration in time range)  bisacodyl (DULCOLAX) EC tablet 5 mg (has no administration in time range)  ondansetron (ZOFRAN) tablet 4 mg (has no administration in time range)    Or  ondansetron (ZOFRAN)  injection 4 mg (has no administration in time range)  ipratropium (ATROVENT) nebulizer solution 0.5 mg (has no administration in time range)  levalbuterol (XOPENEX) nebulizer solution 0.63 mg (has no administration in time range)  hydrALAZINE (APRESOLINE) injection 10 mg (has no administration in time range)  aspirin chewable tablet 324 mg (has no administration in time range)  iohexol (OMNIPAQUE) 350 MG/ML injection 75 mL (75 mLs Intravenous Contrast Given 10/13/2021 1201)    ED Course/ Medical Decision Making/ A&P                           Medical Decision Making Amount and/or Complexity of Data Reviewed Labs: ordered. Radiology: ordered.  Risk Prescription drug management. Decision regarding hospitalization.   This patient presents to the ED for concern of possible stroke., this involves an extensive number of treatment options, and is a complaint that carries with it a high risk of complications and morbidity.  The differential diagnosis includes stroke, hemorrhage, hypertensive crisis, anemia, peripheral neuropathy,   Co morbidities that complicate the patient evaluation  Hypercholesterolemia, prior stroke, expressive aphasia   Additional history obtained:  Additional history obtained from electronic medical record.  I have reviewed the medical record going back several years, it appears that he was admitted with a similar syndrome in August 2021.  During that time the patient had an MRI that showed lacunar infarcts left more than right in the thalamus, other than that chronic small vessel ischemia.  There is no emergent finding.  The admission physical exam from the hospitalist showed that the patient had no signs of acute neurologic dysfunction and that his symptoms had resolved within 1 hour External records from outside source obtained and reviewed including patient was also admitted in June 2021 with hypokalemia.  At that time  the patient was taking a baby aspirin, this is no  longer on his medication list. I have attempted to call family members on both mobile devices and home phone numbers but nobody has answered, awaiting their arrival in the emergency department   Lab Tests:  I Ordered, and personally interpreted labs.  The pertinent results include: Troponin which was elevated at 123456, metabolic panel showing normal renal function, normal electrolytes, normal INR.  He does have an elevated white blood cell count of 14,500 and a hemoglobin of 12.6 With a platelet count of 209,000.   Imaging Studies ordered:  I ordered imaging studies including CT scan of the brain I independently visualized and interpreted imaging which showed no acute findings of acute stroke or hemorrhage I agree with the radiologist interpretation   Cardiac Monitoring: / EKG:  The patient was maintained on a cardiac monitor.  I personally viewed and interpreted the cardiac monitored which showed an underlying rhythm of: Normal sinus rhythm without arrhythmia   Consultations Obtained:  I requested consultation with the neurologist Dr. Quinn Axe,  and discussed lab and imaging findings as well as pertinent plan - they recommend: Not continuing with code stroke given the length of time that he has had symptoms and the lack of large vessel occlusion symptoms, they will see him in person when he is back from CT scan Neurology recommends admission for stroke work-up and canceling code stroke given the length of time he has had symptoms. I further discussed the symptoms and onset with the family member who called me back at a much later timeframe, they said that the patient has had symptoms ongoing and worsening for 3 days, he is usually able to ambulate but has not been able to ambulate the last couple of days because of weakness Discussed with cardiology Dr. Johney Frame who recommends the patient be admitted to [facility] so that they can evaluate the patient as well   Problem List / ED Course /  Critical interventions / Medication management  The patient continues to have right-sided symptoms, continues to not have any chest pain I considered medication including heparin for ischemia and arterial vascular disease however neurology requested MRI be completed first.  MRI cannot be completed until the patient is transferred to [facility], hospitalist aware and will transfer patient and make sure that MRI is completed Reevaluation of the patient after these medicines showed that the patient stayed the same I have reviewed the patients home medicines and have made adjustments as needed   Social Determinants of Health:  Poor historian Acute stroke, Acute MI   Test / Admission - Considered:  Admitted and transferred to higher level of care         Final Clinical Impression(s) / ED Diagnoses Final diagnoses:  NSTEMI (non-ST elevated myocardial infarction) ([place])  Acute ischemic stroke [facility])     Noemi Chapel, MD 10/28/2021 [phone]

## 2021-11-08 NOTE — Assessment & Plan Note (Signed)
-   Continue home medication including risperidone

## 2021-11-08 NOTE — Assessment & Plan Note (Signed)
-   Current home medication does not reflect any BP meds -Monitoring BP closely (For CVA will like to allow permissive hypertension)

## 2021-11-08 NOTE — Assessment & Plan Note (Signed)
History of recent CVA, with right-sided weakness, progressively worsening -Ruling out superimposed stroke -PT/OT consultation for further evaluation recommendation -Fall precaution

## 2021-11-08 NOTE — ED Notes (Signed)
Date and time results received: 11/14/21 1341 (use smartphrase ".now" to insert current time)  Test: troponin Critical Value: 530  Name of Provider Notified: Dr. Hyacinth Meeker  Orders Received? Or Actions Taken?: no/na

## 2021-11-08 NOTE — Assessment & Plan Note (Signed)
Stable currently home medication does not indicate dementia meds

## 2021-11-08 NOTE — H&P (Signed)
History and Physical   Patient: [name]                            PCP: Dettinger, Fransisca Kaufmann, MD                    DOB: [birth year]            DOA: 11/04/2021 [id number]             DOS: 11/07/2021, 3:40 PM  Dettinger, Fransisca Kaufmann, MD  Patient coming from:   HOME  I have personally reviewed patient's medical records, in electronic medical records, including:  [facility], and care everywhere.    Chief Complaint:   Chief Complaint  Patient presents with   Code Stroke    History of present illness:     [name] is a 66 year old male with past medical history of prior stroke HTN, HLD, CAD, COPD, obstructive sleep apnea, seizures, TBI, anxiety, depression, Presented with slurred speech, bilateral right greater than left weakness since yesterday evening.  According to the family he was last seen normal about 1 PM yesterday.  According to the family his weakness was progressively worse on the right side, he also had developed some slurred speech.       ED course: Blood pressure (!) 118/51, pulse 75, temperature 97.6 F (36.4 C), temperature source Oral, resp. rate (!) 26, height 5\' 7"  (1.702 m), weight 56.7 kg, SpO2 93 %.    Latest Ref Rng & Units 10/31/2021   11:57 AM 10/20/2021   11:49 AM 10/15/2021    2:43 PM  CBC  WBC 4.0 - 10.5 K/uL  14.5   7.1    Hemoglobin 13.0 - 17.0 g/dL 13.9   12.6   12.2    Hematocrit 39.0 - 52.0 % 41.0   38.3   35.4    Platelets 150 - 400 K/uL  209   221        Latest Ref Rng & Units 10/15/2021   11:57 AM 10/21/2021   11:49 AM 10/15/2021    2:43 PM  BMP  Glucose 70 - 99 mg/dL 112   114   81    BUN 8 - 23 mg/dL 21   21   19     Creatinine 0.61 - 1.24 mg/dL 0.60   0.70   0.74    BUN/Creat Ratio 10 - 24   26    Sodium 135 - 145 mmol/L 136   136   138    Potassium 3.5 - 5.1 mmol/L 4.2   4.2   4.0    Chloride 98 - 111 mmol/L 98   102   103    CO2 22 - 32 mmol/L  29   25    Calcium 8.9 - 10.3 mg/dL  9.2   9.5      Troponin (Point of Care Test): 530 EKG: Normal sinus rhythm, nonspecific changes negative any ST elevation or depression   CT head:  1. No acute finding. 2. Advanced chronic small vessel ischemia. Generalized cerebral atrophy. 3. Chronic sinusitis.   Code stroke was called, case was discussed with neurologist Dr. Quinn Axe Who recommended patient to be admitted for stroke work-up, heparin drip to be initiated post MRI  Nonspecific changes on EKG with elevated troponin non-STEMI was the patient did not have any chest pain, hemodynamically stable, cardiologist Dr. Johney Frame [facility] Who requested patient to  be [facility] at [facility]  Patient will be admitted for stroke symptoms, acute stroke and non-STEMI at [facility]       Patient Denies having: Fever, Chills, Cough, SOB, Chest Pain, Abd pain, N/V/D, headache, dizziness, lightheadedness,  Dysuria, Joint pain, rash, open wounds     Review of Systems: As per HPI, otherwise 10 point review of systems were negative.   ----------------------------------------------------------------------------------------------------------------------  Allergies  Allergen Reactions   Codeine Rash    Home MEDs:  Prior to Admission medications   Medication Sig Start Date End Date Taking? Authorizing Provider  albuterol (VENTOLIN HFA) 108 (90 Base) MCG/ACT inhaler 2 PUFFS EVERY 6 HOURS AS NEEDED FOR WHEEZING OR SHORTNESS OF BREATH Patient taking differently: Inhale 2 puffs into the lungs every 6 (six) hours as needed for wheezing or shortness of breath. 11/20/19  Yes Dettinger, Fransisca Kaufmann, MD  aspirin 325 MG tablet Take 325 mg by mouth daily.   Yes [provider]  busPIRone (BUSPAR) 10 MG tablet TAKE ONE TABLET THREE TIMES DAILY Patient taking differently: Take 10 mg by mouth 3 (three) times daily. 08/26/21  Yes Dettinger, Fransisca Kaufmann, MD  ipratropium-albuterol (DUONEB) 0.5-2.5 (3) MG/3ML SOLN INHALE 3 MLS BY NEBULIZER 4 TIMES A DAY Patient taking  differently: Take 3 mLs by nebulization every 6 (six) hours as needed (wheezing/shortness of breath). 09/22/21  Yes Dettinger, Fransisca Kaufmann, MD  mupirocin ointment (BACTROBAN) 2 % PLACE 1 APPLICATION INTO THE NOSE 2 TIMES A DAY Patient taking differently: Apply 1 application. topically 2 (two) times daily. PLACE 1 APPLICATION INTO THE NOSE 2 TIMES A DAY 03/13/20  Yes Dettinger, Fransisca Kaufmann, MD  MYRBETRIQ 25 MG TB24 tablet TAKE ONE TABLET ONCE DAILY Patient taking differently: Take 25 mg by mouth daily. 10/27/21  Yes Dettinger, Fransisca Kaufmann, MD  niacin (NIASPAN) 1000 MG CR tablet TAKE 1 TABLET BY MOUTH AT BEDTIME. Patient taking differently: Take 1,000 mg by mouth at bedtime. 08/26/21  Yes Dettinger, Fransisca Kaufmann, MD  Nystatin (GERHARDT'S BUTT CREAM) CREA Apply 1 application topically daily. Equal parts nystatin and hydrocortisone and zinc oxide 03/15/20  Yes Dettinger, Fransisca Kaufmann, MD  omeprazole (PRILOSEC) 40 MG capsule TAKE (1) CAPSULE DAILY Patient taking differently: Take 40 mg by mouth daily. 08/26/21  Yes Dettinger, Fransisca Kaufmann, MD  PARoxetine (PAXIL) 20 MG tablet TAKE ONE TABLET ONCE DAILY Patient taking differently: Take 20 mg by mouth daily. 08/26/21  Yes Dettinger, Fransisca Kaufmann, MD  Potassium 99 MG TABS Take 1 tablet by mouth daily.   Yes [provider]  pramipexole (MIRAPEX) 0.5 MG tablet Take 2 tablets (1 mg total) by mouth at bedtime. 09/19/20  Yes Dettinger, Fransisca Kaufmann, MD  risperiDONE (RISPERDAL) 2 MG tablet Take 1 tablet (2 mg total) by mouth at bedtime. 09/19/20  Yes Dettinger, Fransisca Kaufmann, MD  simvastatin (ZOCOR) 20 MG tablet TAKE ONE TABLET AT BEDTIME Patient taking differently: Take 20 mg by mouth daily at 6 PM. 08/26/21  Yes Dettinger, Fransisca Kaufmann, MD  thiamine 100 MG tablet Take 1 tablet (100 mg total) by mouth daily. 12/02/19  Yes Roxan Hockey, MD    PRN MEDs: sodium chloride, acetaminophen **OR** acetaminophen, bisacodyl, hydrALAZINE, HYDROmorphone (DILAUDID) injection, ipratropium, levalbuterol,  nitroGLYCERIN, ondansetron **OR** ondansetron (ZOFRAN) IV, oxyCODONE, senna-docusate, sodium chloride flush, traZODone  Past Medical History:  Diagnosis Date   Anxiety    Cerebral aneurysm    Closed head injury    COPD (chronic obstructive pulmonary disease) (HCC)    Depression    GERD (gastroesophageal reflux  disease)    Hyperlipidemia    Hypertension    Seizures (HCC)    Sleep apnea     History reviewed. No pertinent surgical history.   reports that he has been smoking cigarettes. He has a 50.00 pack-year smoking history. He has never used smokeless tobacco. He reports current drug use. Drug: Marijuana. He reports that he does not drink alcohol.   Family History  Problem Relation Age of Onset   Obesity Sister     Physical Exam:   Vitals:   11/10/2021 1425 10/17/2021 1430 11/10/2021 1445 11/03/2021 1500  BP: (!) 118/51 (!) 118/52 (!) 128/51 (!) 121/56  Pulse: 75 72 76 76  Resp: (!) 26 (!) 22 (!) 32 (!) 27  Temp:      TempSrc:      SpO2: 93% 92% 94% 95%  Weight:      Height:       Constitutional: NAD, calm, comfortable Eyes: PERRL, lids and conjunctivae normal ENMT: Mucous membranes are moist. Posterior pharynx clear of any exudate or lesions.Normal dentition.  Neck: normal, supple, no masses, no thyromegaly Respiratory: clear to auscultation bilaterally, no wheezing, no crackles. Normal respiratory effort. No accessory muscle use.  Cardiovascular: Regular rate and rhythm, no murmurs / rubs / gallops. No extremity edema. 2+ pedal pulses. No carotid bruits.  Abdomen: no tenderness, no masses palpated. No hepatosplenomegaly. Bowel sounds positive.  Musculoskeletal: no clubbing / cyanosis. No joint deformity upper and lower extremities. Good ROM, no contractures.  Right-sided weakness Neurologic: Speech, right upper and lower extremity weakness 3/4  CN II-XII grossly intact. Sensation intact, DTR normal.  Psychiatric: Normal judgment and insight. Alert and oriented x 3. Normal  mood.  Skin: no rashes, lesions, ulcers. No induration Decubitus/ulcers:  Wounds: per nursing documentation  Pressure Injury 02/03/20 Coccyx Medial redness, flaky (Active)  02/03/20 1227  Location: Coccyx  Location Orientation: Medial  Staging:   Wound Description (Comments): redness, flaky  Present on Admission: Yes    Labs on admission:    I have personally reviewed following labs and imaging studies  CBC: Recent Labs  Lab 11/01/2021 1149 11/05/2021 1157 11/05/2021 1445  WBC 14.5*  --  14.3*  NEUTROABS 11.9*  --   --   HGB 12.6* 13.9 11.8*  HCT 38.3* 41.0 36.1*  MCV 101.6*  --  102.0*  PLT 209  --  [id number]   Basic Metabolic Panel: Recent Labs  Lab 11/01/2021 1149 11/11/2021 1157  NA 136 136  K 4.2 4.2  CL 102 98  CO2 29  --   GLUCOSE 114* 112*  BUN 21 21  CREATININE 0.70 0.60*  CALCIUM 9.2  --    GFR: Estimated Creatinine Clearance: 71.9 mL/min (A) (by C-G formula based on SCr of 0.6 mg/dL (L)). Liver Function Tests: Recent Labs  Lab 11/01/2021 1149  AST 32  ALT 22  ALKPHOS 69  BILITOT 1.0  PROT 7.2  ALBUMIN 3.9   No results for input(s): LIPASE, AMYLASE in the last 168 hours. No results for input(s): AMMONIA in the last 168 hours. Coagulation Profile: Recent Labs  Lab 11/09/2021 1149  INR 1.1    Urine analysis:    Component Value Date/Time   COLORURINE YELLOW 02/02/2020 0736   APPEARANCEUR CLEAR 02/02/2020 0736   APPEARANCEUR Clear 11/12/2017 1144   LABSPEC 1.023 02/02/2020 0736   PHURINE 6.0 02/02/2020 0736   GLUCOSEU NEGATIVE 02/02/2020 0736   HGBUR NEGATIVE 02/02/2020 0736   BILIRUBINUR NEGATIVE 02/02/2020 0736   BILIRUBINUR Negative 11/12/2017  [place] (A) 02/02/2020 0736   PROTEINUR NEGATIVE 02/02/2020 0736   UROBILINOGEN negative 11/04/2012 1302   NITRITE NEGATIVE 02/02/2020 0736   LEUKOCYTESUR NEGATIVE 02/02/2020 0736    Last A1C:  Lab Results  Component Value Date   HGBA1C 6.1 (H) 02/03/2020     Radiologic Exams on  Admission:   CT HEAD CODE STROKE WO CONTRAST  Result Date: 10/25/2021 CLINICAL DATA:  Code stroke. Stroke suspected. Right-sided weakness for 23 hours EXAM: CT HEAD WITHOUT CONTRAST TECHNIQUE: Contiguous axial images were obtained from the base of the skull through the vertex without intravenous contrast. RADIATION DOSE REDUCTION: This exam was performed according to the departmental dose-optimization program which includes automated exposure control, adjustment of the mA and/or kV according to patient size and/or use of iterative reconstruction technique. COMPARISON:  09/14/2021 FINDINGS: Brain: No evidence of acute infarction, hemorrhage, hydrocephalus, extra-axial collection or mass lesion/mass effect. Multiple chronic lacunar infarcts at the bilateral deep gray nuclei, especially the bilateral thalamus. 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 Premature chronic small vessel ischemia. Confluence clinic gliosis in the cerebral white matter. Vascular: No hyperdense vessel or unexpected calcification. Skull: Normal. Negative for fracture or focal lesion. Prior burr holes on the left. Sinuses/Orbits: Chronic bilateral maxillary sinusitis with left more than right opacification and sclerotic wall thickening. ASPECTS [facility] Stroke Program Early CT Score) - Ganglionic level infarction (caudate, lentiform nuclei, internal capsule, insula, M1-M3 cortex): 7 - Supraganglionic infarction (M4-M6 cortex): 3 Total score (0-10 with 10 being normal): 10 IMPRESSION: 1. No acute finding. 2. Advanced chronic small vessel ischemia. Generalized cerebral atrophy. 3. Chronic sinusitis. Electronically Signed   By: Jorje Guild M.D.   On: 10/24/2021 12:04    EKG:   Independently reviewed.  Orders placed or performed during the hospital encounter of 11/05/2021   ED EKG   ED EKG   EKG 12-Lead   EKG 12-Lead   EKG 12-Lead    ---------------------------------------------------------------------------------------------------------------------------------------    Assessment / Plan:   Principal Problem:   CVA (cerebral vascular accident) [facility]) Active Problems:   NSTEMI (non-ST elevated myocardial infarction) ([place])   Hyperlipidemia   Generalized anxiety disorder   Depression, recurrent ([place])   Essential hypertension   History of cerebral aneurysm   COPD (chronic obstructive pulmonary disease) (HCC)   GERD   Seizure disorder (HCC)   CAD (coronary artery disease)   Memory loss or impairment   Macrocytic anemia   Right sided weakness   Assessment and Plan: * CVA (cerebral vascular accident) ([place]) - Right-sided weakness with slurred speech approximately 23 hours ago -Teleneurology was consulted Dr. Quinn Axe has evaluated the patient patient is not a tPA candidate -Recommend stroke work-up -Pending MRI of the brain --- [facility] at [facility] -2D echocardiogram -Every 4 neurochecks -Labs A1c, lipid panel, -Consulting PT/OT/speech -Per neurology recommendation aspirin plus Plavix for 21 days then aspirin 81 mg daily (we may hold off Plavix as patient may need heparin drip for non-STEMI-cardiac intervention)  NSTEMI (non-ST elevated myocardial infarction) (HCC) - Recycling cardiac enzymes -Initial troponin elevated: -As needed nitroglycerin, morphine -Aspirin, high-dose statins -Cardiology Dr. Johney Frame consulted: Anticipating initiation of heparin drip post MRI -Currently hemodynamically stable, denies chest pain -Repeating EKG as needed  -Likely will start heparin drip tonight if MRI negative for any bleed, and okay by cardiology  Hyperlipidemia - Resuming high-dose statins -Obtaining fasting lab panel  Generalized anxiety disorder - Continue home medication including risperidone  Right sided weakness History of  recent CVA, with right-sided weakness, progressively worsening -Ruling out  superimposed stroke -PT/OT consultation for further evaluation recommendation -Fall precaution  Macrocytic anemia - H&H stable  Memory loss or impairment Stable currently home medication does not indicate dementia meds  CAD (coronary artery disease) - Continue aspirin, statins, -  Seizure disorder ([place]) ?  History of seizures (no indication of seizures in electronic medical records) -Med rec reviewed, no seizure medication identified  Only BuSpar Paxil and risperidone  GERD - Continue Protonix  COPD (chronic obstructive pulmonary disease) (HCC) Currently stable, no signs of exacerbation -As needed DuoNeb, oxygen supplement  History of cerebral aneurysm - Currently stable, CT of the head unremarkable -MRI of the brain  Essential hypertension - Current home medication does not reflect any BP meds -Monitoring BP closely (For CVA will like to allow permissive hypertension)  Depression, recurrent (HCC)  Resume home medication including risperidone,  BuSpar and Paxi     Consults called:: Dr. Quinn Axe, Dr. Theda Sers, cardiologist Dr. Johney Frame -------------------------------------------------------------------------------------------------------------------------------------------- DVT prophylaxis:  heparin injection 5,000 Units Start: 10/23/2021 2200 TED hose Start: 11/10/2021 1405 SCDs Start: 11/10/2021 1405   Code Status:   Code Status: Full Code   Admission status: Patient will be admitted as Inpatient, with a greater than 2 midnight length of stay. Level of care: Telemetry Cardiac   Family Communication:  none at bedside  (The above findings and plan of care has been discussed with patient in detail, the patient expressed understanding and agreement of above plan)  --------------------------------------------------------------------------------------------------------------------------------------------------  Disposition Plan: >3 days Status is: Inpatient Remains  inpatient appropriate because: Needing stroke work-up, and evaluation and non-STEMI work-up.   -----------------------------------------------------------------------------------------------------------------------------  Time spent: > than  78  Min.   SIGNED: Deatra James, MD, FHM. [facility],  Pager (Please use [email or web address] to page to text)  > 75 minutes of critical care time was spent in seeing admitting reviewing all medical records, coordinating care with neurologist and cardiologist   if 7PM-7AM, please contact night-coverage [email or web address],  11/04/2021, 3:40 PM

## 2021-11-08 NOTE — Assessment & Plan Note (Signed)
Continue Protonix °

## 2021-11-08 NOTE — Plan of Care (Signed)
Off the floor for MRI at 2015.

## 2021-11-08 NOTE — Assessment & Plan Note (Addendum)
-   Recycling cardiac enzymes -Initial troponin elevated: -As needed nitroglycerin, morphine -Aspirin, high-dose statins -Cardiology Dr. Shari Prows consulted: Anticipating initiation of heparin drip post MRI -Currently hemodynamically stable, denies chest pain -Repeating EKG as needed  -Likely will start heparin drip tonight if MRI negative for any bleed, and okay by cardiology

## 2021-11-08 NOTE — Progress Notes (Signed)
Code Stroke activated @ 1139-pre-alert.  Pt arrived by EMS @ 1147.  C/o right sided weakness, slurred speech, fall @ home. LKWT 1300 yesterday. mRS 2.  To CT @ [id number], returned @ 1216.  Dr. Quinn Axe on camera @ 1208. NCCT neg for bleed.   Dismissed @ 1222 by Dr. Quinn Axe.

## 2021-11-08 NOTE — Assessment & Plan Note (Signed)
H/H stable.

## 2021-11-08 NOTE — Assessment & Plan Note (Signed)
-   Continue aspirin, statins, -

## 2021-11-08 NOTE — ED Notes (Signed)
EMS called CODE STROKE @ 1130 beeped out and called CT.

## 2021-11-08 NOTE — ED Notes (Signed)
Per MD, patient may take PO meds.

## 2021-11-08 NOTE — ED Triage Notes (Signed)
Pt brought in by ems for c/o code stroke; family reported to ems pt started having increased slurred speech and increased right sided weakness and stumbling that started yesterday at 1pm; pt denies any worsening slurred speech

## 2021-11-08 NOTE — ED Notes (Signed)
Hospitalist in room at this time speaking with patient.

## 2021-11-08 NOTE — Assessment & Plan Note (Signed)
?    History of seizures (no indication of seizures in electronic medical records) -Med rec reviewed, no seizure medication identified  Only BuSpar Paxil and risperidone

## 2021-11-08 NOTE — Progress Notes (Signed)
1130call time 1131 beeper time 1154 exam started 1156 exam finished 1156 images sent to soc 1159 exam completed in epic 1159 [facility] radiology called

## 2021-11-08 NOTE — Assessment & Plan Note (Signed)
Resume home medication including risperidone,  BuSpar and Paxi

## 2021-11-08 NOTE — Progress Notes (Signed)
Pt arrived to the floor via stretcher by [facility] around 1845. Pt alert and oriented x4 in no acute distress upon arrival. VSS. Respirations even and mildly labored on room air. SpO2 above 95%. Pt oriented to room. Bed in low position and call bell within reach.

## 2021-11-08 NOTE — Hospital Course (Signed)
  [name] is a 66 year old male with past medical history of prior stroke HTN, HLD, CAD, COPD, obstructive sleep apnea, seizures, TBI, anxiety, depression, Presented with slurred speech, bilateral right greater than left weakness since yesterday evening.  According to the family he was last seen normal about 1 PM yesterday.  According to the family his weakness was progressively worse on the right side, he also had developed some slurred speech.       ED course: Blood pressure (!) 118/51, pulse 75, temperature 97.6 F (36.4 C), temperature source Oral, resp. rate (!) 26, height 5\' 7"  (1.702 m), weight 56.7 kg, SpO2 93 %.    Latest Ref Rng & Units 10/27/2021   11:57 AM 10/25/2021   11:49 AM 10/15/2021    2:43 PM  CBC  WBC 4.0 - 10.5 K/uL  14.5   7.1    Hemoglobin 13.0 - 17.0 g/dL 13.9   12.6   12.2    Hematocrit 39.0 - 52.0 % 41.0   38.3   35.4    Platelets 150 - 400 K/uL  209   221        Latest Ref Rng & Units 10/13/2021   11:57 AM 10/28/2021   11:49 AM 10/15/2021    2:43 PM  BMP  Glucose 70 - 99 mg/dL 112   114   81    BUN 8 - 23 mg/dL 21   21   19     Creatinine 0.61 - 1.24 mg/dL 0.60   0.70   0.74    BUN/Creat Ratio 10 - 24   26    Sodium 135 - 145 mmol/L 136   136   138    Potassium 3.5 - 5.1 mmol/L 4.2   4.2   4.0    Chloride 98 - 111 mmol/L 98   102   103    CO2 22 - 32 mmol/L  29   25    Calcium 8.9 - 10.3 mg/dL  9.2   9.5     Troponin (Point of Care Test): 530 EKG: Normal sinus rhythm, nonspecific changes negative any ST elevation or depression   CT head:  1. No acute finding. 2. Advanced chronic small vessel ischemia. Generalized cerebral atrophy. 3. Chronic sinusitis.   Code stroke was called, case was discussed with neurologist Dr. Quinn Axe Who recommended patient to be admitted for stroke work-up, heparin drip to be initiated post MRI  Nonspecific changes on EKG with elevated troponin non-STEMI was the patient did not have any chest pain,  hemodynamically stable, cardiologist Dr. Johney Frame contacted at [facility]... Who requested patient to be evaluated at [facility]  Patient will be admitted for stroke symptoms, acute stroke and non-STEMI at [facility].

## 2021-11-09 ENCOUNTER — Inpatient Hospital Stay (HOSPITAL_COMMUNITY): Payer: Medicare Other

## 2021-11-09 ENCOUNTER — Encounter (HOSPITAL_COMMUNITY): Payer: Self-pay | Admitting: Family Medicine

## 2021-11-09 DIAGNOSIS — I6389 Other cerebral infarction: Secondary | ICD-10-CM | POA: Diagnosis not present

## 2021-11-09 DIAGNOSIS — R531 Weakness: Secondary | ICD-10-CM

## 2021-11-09 DIAGNOSIS — Z8679 Personal history of other diseases of the circulatory system: Secondary | ICD-10-CM

## 2021-11-09 DIAGNOSIS — I6523 Occlusion and stenosis of bilateral carotid arteries: Secondary | ICD-10-CM

## 2021-11-09 DIAGNOSIS — I639 Cerebral infarction, unspecified: Secondary | ICD-10-CM

## 2021-11-09 DIAGNOSIS — R778 Other specified abnormalities of plasma proteins: Secondary | ICD-10-CM

## 2021-11-09 DIAGNOSIS — I25119 Atherosclerotic heart disease of native coronary artery with unspecified angina pectoris: Secondary | ICD-10-CM | POA: Diagnosis not present

## 2021-11-09 DIAGNOSIS — G459 Transient cerebral ischemic attack, unspecified: Secondary | ICD-10-CM

## 2021-11-09 DIAGNOSIS — R479 Unspecified speech disturbances: Secondary | ICD-10-CM | POA: Diagnosis not present

## 2021-11-09 DIAGNOSIS — I10 Essential (primary) hypertension: Secondary | ICD-10-CM

## 2021-11-09 DIAGNOSIS — I214 Non-ST elevation (NSTEMI) myocardial infarction: Secondary | ICD-10-CM | POA: Diagnosis not present

## 2021-11-09 DIAGNOSIS — J439 Emphysema, unspecified: Secondary | ICD-10-CM | POA: Diagnosis not present

## 2021-11-09 DIAGNOSIS — L8990 Pressure ulcer of unspecified site, unspecified stage: Secondary | ICD-10-CM | POA: Diagnosis present

## 2021-11-09 DIAGNOSIS — G40909 Epilepsy, unspecified, not intractable, without status epilepticus: Secondary | ICD-10-CM

## 2021-11-09 DIAGNOSIS — E782 Mixed hyperlipidemia: Secondary | ICD-10-CM | POA: Diagnosis not present

## 2021-11-09 DIAGNOSIS — F339 Major depressive disorder, recurrent, unspecified: Secondary | ICD-10-CM

## 2021-11-09 LAB — COMPREHENSIVE METABOLIC PANEL
ALT: 19 U/L (ref 0–44)
AST: 29 U/L (ref 15–41)
Albumin: 2.9 g/dL — ABNORMAL LOW (ref 3.5–5.0)
Alkaline Phosphatase: 56 U/L (ref 38–126)
Anion gap: 10 (ref 5–15)
BUN: 17 mg/dL (ref 8–23)
CO2: 23 mmol/L (ref 22–32)
Calcium: 8.5 mg/dL — ABNORMAL LOW (ref 8.9–10.3)
Chloride: 103 mmol/L (ref 98–111)
Creatinine, Ser: 0.8 mg/dL (ref 0.61–1.24)
GFR, Estimated: >60 mL/min (ref >60)
Glucose, Bld: 88 mg/dL (ref 70–99)
Potassium: 3.7 mmol/L (ref 3.5–5.1)
Sodium: 136 mmol/L (ref 135–145)
Total Bilirubin: 1.4 mg/dL — ABNORMAL HIGH (ref 0.3–1.2)
Total Protein: 5.7 g/dL — ABNORMAL LOW (ref 6.5–8.1)

## 2021-11-09 LAB — ECHOCARDIOGRAM COMPLETE
AR max vel: 3.54 cm2
AV Area VTI: 3.33 cm2
AV Area mean vel: 3.37 cm2
AV Mean grad: 2 mmHg
AV Peak grad: 4.2 mmHg
Ao pk vel: 1.02 m/s
Area-P 1/2: 5.62 cm2
Height: 67 in
S' Lateral: 3.8 cm
Weight: 2021.18 oz

## 2021-11-09 LAB — CBC
HCT: 33.7 % — ABNORMAL LOW (ref 39.0–52.0)
Hemoglobin: 10.9 g/dL — ABNORMAL LOW (ref 13.0–17.0)
MCH: 32.8 pg (ref 26.0–34.0)
MCHC: 32.3 g/dL (ref 30.0–36.0)
MCV: 101.5 fL — ABNORMAL HIGH (ref 80.0–100.0)
Platelets: 189 10*3/uL (ref 150–400)
RBC: 3.32 MIL/uL — ABNORMAL LOW (ref 4.22–5.81)
RDW: 13.2 % (ref 11.5–15.5)
WBC: 11.8 10*3/uL — ABNORMAL HIGH (ref 4.0–10.5)
nRBC: 0 % (ref 0.0–0.2)

## 2021-11-09 LAB — VITAMIN B12: Vitamin B-12: 807 pg/mL (ref 180–914)

## 2021-11-09 LAB — GLUCOSE, CAPILLARY: Glucose-Capillary: 205 mg/dL — ABNORMAL HIGH (ref 70–99)

## 2021-11-09 LAB — TSH: TSH: 0.812 u[IU]/mL (ref 0.350–4.500)

## 2021-11-09 LAB — HEPARIN LEVEL (UNFRACTIONATED)
Heparin Unfractionated: <0.1 IU/mL — ABNORMAL LOW (ref 0.30–0.70)
Heparin Unfractionated: <0.1 IU/mL — ABNORMAL LOW (ref 0.30–0.70)

## 2021-11-09 LAB — BRAIN NATRIURETIC PEPTIDE: B Natriuretic Peptide: 620.9 pg/mL — ABNORMAL HIGH (ref 0.0–100.0)

## 2021-11-09 LAB — MRSA NEXT GEN BY PCR, NASAL: MRSA by PCR Next Gen: NOT DETECTED

## 2021-11-09 LAB — HEMOGLOBIN A1C
Hgb A1c MFr Bld: 6.2 % — ABNORMAL HIGH (ref 4.8–5.6)
Mean Plasma Glucose: 131.24 mg/dL

## 2021-11-09 LAB — MAGNESIUM: Magnesium: 2 mg/dL (ref 1.7–2.4)

## 2021-11-09 MED ORDER — LORAZEPAM 2 MG/ML IJ SOLN
INTRAMUSCULAR | Status: AC
  Filled 2021-11-09: qty 1

## 2021-11-09 MED ORDER — NOREPINEPHRINE 4 MG/250ML-% IV SOLN
2.0000 ug/min | INTRAVENOUS | Status: DC
  Filled 2021-11-09: qty 250

## 2021-11-09 MED ORDER — CHLORHEXIDINE GLUCONATE CLOTH 2 % EX PADS
6.0000 | MEDICATED_PAD | Freq: Every day | CUTANEOUS | Status: DC
  Administered 2021-11-09 – 2021-11-14 (×6): 6 via TOPICAL

## 2021-11-09 MED ORDER — MIDAZOLAM HCL 2 MG/2ML IJ SOLN
1.0000 mg | INTRAMUSCULAR | Status: DC | PRN
  Administered 2021-11-12 – 2021-11-13 (×4): 1 mg via INTRAVENOUS
  Filled 2021-11-09 (×4): qty 2

## 2021-11-09 MED ORDER — CHLORHEXIDINE GLUCONATE 0.12% ORAL RINSE (MEDLINE KIT)
15.0000 mL | Freq: Two times a day (BID) | OROMUCOSAL | Status: DC
  Administered 2021-11-09 – 2021-11-14 (×11): 15 mL via OROMUCOSAL

## 2021-11-09 MED ORDER — FUROSEMIDE 10 MG/ML IJ SOLN: 20.0000 mg | Freq: Once | INTRAMUSCULAR | Status: DC

## 2021-11-09 MED ORDER — ORAL CARE MOUTH RINSE
15.0000 mL | OROMUCOSAL | Status: DC
  Administered 2021-11-10 – 2021-11-14 (×45): 15 mL via OROMUCOSAL

## 2021-11-09 MED ORDER — CARVEDILOL 3.125 MG PO TABS
3.1250 mg | ORAL_TABLET | Freq: Two times a day (BID) | ORAL | Status: DC
  Filled 2021-11-09: qty 1

## 2021-11-09 MED ORDER — SUCCINYLCHOLINE CHLORIDE 200 MG/10ML IV SOSY
PREFILLED_SYRINGE | INTRAVENOUS | Status: AC
  Filled 2021-11-09: qty 10

## 2021-11-09 MED ORDER — SODIUM CHLORIDE 0.9 % IV SOLN
250.0000 mL | INTRAVENOUS | Status: DC
Start: 2021-11-09 — End: 2021-11-16
  Administered 2021-11-11: 250 mL via INTRAVENOUS

## 2021-11-09 MED ORDER — FUROSEMIDE 10 MG/ML IJ SOLN: 20.0000 mg | Freq: Once | INTRAMUSCULAR | Status: AC

## 2021-11-09 MED ORDER — INSULIN ASPART 100 UNIT/ML IJ SOLN
0.0000 [IU] | INTRAMUSCULAR | Status: DC
  Administered 2021-11-10 (×2): 1 [IU] via SUBCUTANEOUS
  Administered 2021-11-10: 2 [IU] via SUBCUTANEOUS
  Administered 2021-11-10: 3 [IU] via SUBCUTANEOUS
  Administered 2021-11-11 (×2): 1 [IU] via SUBCUTANEOUS
  Administered 2021-11-12 – 2021-11-13 (×3): 2 [IU] via SUBCUTANEOUS
  Administered 2021-11-13 (×4): 1 [IU] via SUBCUTANEOUS
  Administered 2021-11-13: 2 [IU] via SUBCUTANEOUS
  Administered 2021-11-13 – 2021-11-14 (×5): 1 [IU] via SUBCUTANEOUS

## 2021-11-09 MED ORDER — MIDAZOLAM HCL 2 MG/2ML IJ SOLN: 2.0000 mg | Freq: Once | INTRAMUSCULAR | Status: AC

## 2021-11-09 MED ORDER — ROCURONIUM BROMIDE 10 MG/ML (PF) SYRINGE
PREFILLED_SYRINGE | INTRAVENOUS | Status: AC
  Administered 2021-11-09: 60 mg via INTRAVENOUS
  Filled 2021-11-09: qty 10

## 2021-11-09 MED ORDER — PHENYLEPHRINE HCL-NACL 20-0.9 MG/250ML-% IV SOLN
25.0000 ug/min | INTRAVENOUS | Status: DC
  Administered 2021-11-09: 50 ug/min via INTRAVENOUS
  Filled 2021-11-09: qty 250

## 2021-11-09 MED ORDER — DOCUSATE SODIUM 50 MG/5ML PO LIQD
100.0000 mg | Freq: Two times a day (BID) | ORAL | Status: DC
  Administered 2021-11-10 – 2021-11-13 (×6): 100 mg
  Filled 2021-11-09 (×6): qty 10

## 2021-11-09 MED ORDER — SODIUM CHLORIDE 0.9 % IV SOLN: 250.0000 mL | INTRAVENOUS | Status: DC

## 2021-11-09 MED ORDER — ROCURONIUM BROMIDE 50 MG/5ML IV SOLN: 60.0000 mg | Freq: Once | INTRAVENOUS | Status: AC

## 2021-11-09 MED ORDER — ETOMIDATE 2 MG/ML IV SOLN
INTRAVENOUS | Status: AC
  Administered 2021-11-09: 20 mg via INTRAVENOUS
  Filled 2021-11-09: qty 20

## 2021-11-09 MED ORDER — FENTANYL CITRATE (PF) 100 MCG/2ML IJ SOLN
25.0000 ug | INTRAMUSCULAR | Status: DC | PRN
  Administered 2021-11-10: 50 ug via INTRAVENOUS
  Administered 2021-11-10: 100 ug via INTRAVENOUS
  Administered 2021-11-10: 50 ug via INTRAVENOUS
  Filled 2021-11-09 (×3): qty 2

## 2021-11-09 MED ORDER — FENTANYL CITRATE (PF) 100 MCG/2ML IJ SOLN: 25.0000 ug | INTRAMUSCULAR | Status: DC | PRN

## 2021-11-09 MED ORDER — FUROSEMIDE 10 MG/ML IJ SOLN: 40.0000 mg | Freq: Once | INTRAMUSCULAR | Status: DC

## 2021-11-09 MED ORDER — FENTANYL CITRATE (PF) 100 MCG/2ML IJ SOLN: 100.0000 ug | Freq: Once | INTRAMUSCULAR | Status: AC

## 2021-11-09 MED ORDER — NOREPINEPHRINE 4 MG/250ML-% IV SOLN
INTRAVENOUS | Status: AC
  Administered 2021-11-09: 5 ug/min via INTRAVENOUS
  Filled 2021-11-09: qty 250

## 2021-11-09 MED ORDER — MIDAZOLAM HCL 2 MG/2ML IJ SOLN
1.0000 mg | INTRAMUSCULAR | Status: AC | PRN
  Administered 2021-11-10 – 2021-11-14 (×3): 1 mg via INTRAVENOUS
  Filled 2021-11-09 (×3): qty 2

## 2021-11-09 MED ORDER — SODIUM CHLORIDE 0.9 % IV SOLN
3.0000 g | Freq: Four times a day (QID) | INTRAVENOUS | Status: DC
  Administered 2021-11-09 – 2021-11-14 (×20): 3 g via INTRAVENOUS
  Filled 2021-11-09 (×20): qty 8

## 2021-11-09 MED ORDER — FUROSEMIDE 10 MG/ML IJ SOLN
INTRAMUSCULAR | Status: AC
  Administered 2021-11-09: 20 mg via INTRAVENOUS
  Filled 2021-11-09: qty 2

## 2021-11-09 MED ORDER — POLYETHYLENE GLYCOL 3350 17 G PO PACK
17.0000 g | PACK | Freq: Every day | ORAL | Status: DC
  Administered 2021-11-10 – 2021-11-13 (×4): 17 g
  Filled 2021-11-09 (×3): qty 1

## 2021-11-09 MED ORDER — FENTANYL CITRATE PF 50 MCG/ML IJ SOSY
PREFILLED_SYRINGE | INTRAMUSCULAR | Status: AC
  Filled 2021-11-09: qty 2

## 2021-11-09 MED ORDER — SODIUM CHLORIDE 0.9 % IV SOLN: INTRAVENOUS | Status: DC | PRN

## 2021-11-09 MED ORDER — FENTANYL CITRATE (PF) 100 MCG/2ML IJ SOLN
INTRAMUSCULAR | Status: AC
  Administered 2021-11-09: 100 ug via INTRAVENOUS
  Filled 2021-11-09: qty 2

## 2021-11-09 MED ORDER — SODIUM CHLORIDE 0.9 % IV SOLN: INTRAVENOUS | Status: DC

## 2021-11-09 MED ORDER — ATORVASTATIN CALCIUM 40 MG PO TABS: 80.0000 mg | ORAL_TABLET | Freq: Every day | ORAL | Status: DC

## 2021-11-09 MED ORDER — IOHEXOL 350 MG/ML SOLN
100.0000 mL | Freq: Once | INTRAVENOUS | Status: AC | PRN
  Administered 2021-11-09: 100 mL via INTRAVENOUS

## 2021-11-09 MED ORDER — MIDAZOLAM HCL 2 MG/2ML IJ SOLN
INTRAMUSCULAR | Status: AC
  Administered 2021-11-09: 2 mg via INTRAVENOUS
  Filled 2021-11-09: qty 2

## 2021-11-09 MED ORDER — ETOMIDATE 2 MG/ML IV SOLN
20.0000 mg | Freq: Once | INTRAVENOUS | Status: AC
Start: 2021-11-09 — End: 2021-11-09

## 2021-11-09 NOTE — Progress Notes (Addendum)
ANTICOAGULATION CONSULT NOTE - Follow Up Consult  Pharmacy Consult for heparin Indication: chest pain/ACS  Allergies  Allergen Reactions   Codeine Rash    Patient Measurements: Height: 5\' 7"  (170.2 cm) Weight: 57.3 kg (126 lb 5.2 oz) IBW/kg (Calculated) : 66.1 Heparin Dosing Weight: 57 kg  Vital Signs: Temp: 98 F (36.7 C) (05/28 0422) Temp Source: Oral (05/28 0422) BP: 128/55 (05/28 0422) Pulse Rate: 90 (05/28 0422)  Labs: Recent Labs    10/23/2021 1149 10/24/2021 1157 10/14/2021 1245 10/16/2021 1445 11/03/2021 1554 11/09/21 0624  HGB 12.6* 13.9  --  11.8*  --  10.9*  HCT 38.3* 41.0  --  36.1*  --  33.7*  PLT 209  --   --  199  --  189  APTT 24  --   --   --   --   --   LABPROT 14.2  --   --   --   --   --   INR 1.1  --   --   --   --   --   HEPARINUNFRC  --   --   --   --   --  <0.10*  CREATININE 0.70 0.60*  --   --   --  0.80  TROPONINIHS  --   --  530* 561* 557*  --     Estimated Creatinine Clearance: 72.6 mL/min (by C-G formula based on SCr of 0.8 mg/dL).   Medications:  Medications Prior to Admission  Medication Sig Dispense Refill Last Dose   albuterol (VENTOLIN HFA) 108 (90 Base) MCG/ACT inhaler 2 PUFFS EVERY 6 HOURS AS NEEDED FOR WHEEZING OR SHORTNESS OF BREATH (Patient taking differently: Inhale 2 puffs into the lungs every 6 (six) hours as needed for wheezing or shortness of breath.) 18 g 1 unknown   aspirin 325 MG tablet Take 325 mg by mouth daily.   11/03/2021   busPIRone (BUSPAR) 10 MG tablet TAKE ONE TABLET THREE TIMES DAILY (Patient taking differently: Take 10 mg by mouth 3 (three) times daily.) 90 tablet 1 11/12/2021   ipratropium-albuterol (DUONEB) 0.5-2.5 (3) MG/3ML SOLN INHALE 3 MLS BY NEBULIZER 4 TIMES A DAY (Patient taking differently: Take 3 mLs by nebulization every 6 (six) hours as needed (wheezing/shortness of breath).) 360 mL 0 unknown   mupirocin ointment (BACTROBAN) 2 % PLACE 1 APPLICATION INTO THE NOSE 2 TIMES A DAY (Patient taking differently:  Apply 1 application. topically 2 (two) times daily. PLACE 1 APPLICATION INTO THE NOSE 2 TIMES A DAY) 22 g 5 Past Week   MYRBETRIQ 25 MG TB24 tablet TAKE ONE TABLET ONCE DAILY (Patient taking differently: Take 25 mg by mouth daily.) 30 tablet 5 11/09/2021   niacin (NIASPAN) 1000 MG CR tablet TAKE 1 TABLET BY MOUTH AT BEDTIME. (Patient taking differently: Take 1,000 mg by mouth at bedtime.) 90 tablet 0 10/24/2021   Nystatin (GERHARDT'S BUTT CREAM) CREA Apply 1 application topically daily. Equal parts nystatin and hydrocortisone and zinc oxide 1 each 2 Past Week   omeprazole (PRILOSEC) 40 MG capsule TAKE (1) CAPSULE DAILY (Patient taking differently: Take 40 mg by mouth daily.) 90 capsule 0 10/28/2021   PARoxetine (PAXIL) 20 MG tablet TAKE ONE TABLET ONCE DAILY (Patient taking differently: Take 20 mg by mouth daily.) 90 tablet 0 10/25/2021   Potassium 99 MG TABS Take 1 tablet by mouth daily.   10/20/2021   pramipexole (MIRAPEX) 0.5 MG tablet Take 2 tablets (1 mg total) by mouth at bedtime. 90 tablet 3  11/07/2021   risperiDONE (RISPERDAL) 2 MG tablet Take 1 tablet (2 mg total) by mouth at bedtime. 90 tablet 3 11/07/2021   simvastatin (ZOCOR) 20 MG tablet TAKE ONE TABLET AT BEDTIME (Patient taking differently: Take 20 mg by mouth daily at 6 PM.) 90 tablet 0 11/07/2021   thiamine 100 MG tablet Take 1 tablet (100 mg total) by mouth daily. 30 tablet 2 11/06/2021    Assessment: 66 year old male transferred from [facility] to [facility] for stroke and cardiac work up. History of recent CVA. Patient with negative CT and MRI for bleeding. Positive troponins to start IV heparin. Hgb down slightly to 10.9, Plt 189, not on anticoagulants prior to admission. He was taking aspirin 325 mg daily (was increased from 81 mg daily in August 2021 during admission for TIA w/ MRA head noting extensive intracranial atherosclerosis).  Heparin level undetectable this morning, verified no issues with infusion overnight or this morning per RN.  Goal  of Therapy:  Heparin level 0.3-0.5 units/ml Monitor platelets by anticoagulation protocol: Yes    Plan:  Increase heparin infusion to 850 units/hr Check anti-Xa level in 6-8 hours and daily while on heparin Continue to monitor H&H and platelets F/u anticoag and antiplatelet plans   Thank you for allowing [place] to participate in this patients care. Jens Som, PharmD 11/09/2021 8:07 AM  **Pharmacist phone directory can be found on [email or web address] listed under [place]**

## 2021-11-09 NOTE — Progress Notes (Signed)
Called pts sister, Francoise Schaumann, and updated her on pts status since moving to ICU. All questions and concerns answered. Support given.  Also verified consent for A-line insertion with Aram Candela, RN.

## 2021-11-09 NOTE — Progress Notes (Signed)
ANTICOAGULATION CONSULT NOTE - Follow Up Consult  Pharmacy Consult for heparin Indication: chest pain/ACS  Allergies  Allergen Reactions   Codeine Rash    Patient Measurements: Height: 5\' 7"  (170.2 cm) Weight: 57.3 kg (126 lb 5.2 oz) IBW/kg (Calculated) : 66.1 Heparin Dosing Weight: 57 kg  Vital Signs: Temp: 98.4 F (36.9 C) (05/28 1230) Temp Source: Oral (05/28 1230) BP: 141/57 (05/28 1230) Pulse Rate: 86 (05/28 1230)  Labs: Recent Labs    11/05/2021 1149 10/26/2021 1157 11/12/2021 1245 10/14/2021 1445 10/25/2021 1554 11/09/21 0624 11/09/21 1425  HGB 12.6* 13.9  --  11.8*  --  10.9*  --   HCT 38.3* 41.0  --  36.1*  --  33.7*  --   PLT 209  --   --  199  --  189  --   APTT 24  --   --   --   --   --   --   LABPROT 14.2  --   --   --   --   --   --   INR 1.1  --   --   --   --   --   --   HEPARINUNFRC  --   --   --   --   --  <0.10* <0.10*  CREATININE 0.70 0.60*  --   --   --  0.80  --   TROPONINIHS  --   --  530* 561* 557*  --   --      Estimated Creatinine Clearance: 72.6 mL/min (by C-G formula based on SCr of 0.8 mg/dL).   Medications:  Medications Prior to Admission  Medication Sig Dispense Refill Last Dose   albuterol (VENTOLIN HFA) 108 (90 Base) MCG/ACT inhaler 2 PUFFS EVERY 6 HOURS AS NEEDED FOR WHEEZING OR SHORTNESS OF BREATH (Patient taking differently: Inhale 2 puffs into the lungs every 6 (six) hours as needed for wheezing or shortness of breath.) 18 g 1 unknown   aspirin 325 MG tablet Take 325 mg by mouth daily.   10/25/2021   busPIRone (BUSPAR) 10 MG tablet TAKE ONE TABLET THREE TIMES DAILY (Patient taking differently: Take 10 mg by mouth 3 (three) times daily.) 90 tablet 1 10/16/2021   ipratropium-albuterol (DUONEB) 0.5-2.5 (3) MG/3ML SOLN INHALE 3 MLS BY NEBULIZER 4 TIMES A DAY (Patient taking differently: Take 3 mLs by nebulization every 6 (six) hours as needed (wheezing/shortness of breath).) 360 mL 0 unknown   mupirocin ointment (BACTROBAN) 2 % PLACE 1  APPLICATION INTO THE NOSE 2 TIMES A DAY (Patient taking differently: Apply 1 application. topically 2 (two) times daily. PLACE 1 APPLICATION INTO THE NOSE 2 TIMES A DAY) 22 g 5 Past Week   MYRBETRIQ 25 MG TB24 tablet TAKE ONE TABLET ONCE DAILY (Patient taking differently: Take 25 mg by mouth daily.) 30 tablet 5 10/19/2021   niacin (NIASPAN) 1000 MG CR tablet TAKE 1 TABLET BY MOUTH AT BEDTIME. (Patient taking differently: Take 1,000 mg by mouth at bedtime.) 90 tablet 0 11/11/2021   Nystatin (GERHARDT'S BUTT CREAM) CREA Apply 1 application topically daily. Equal parts nystatin and hydrocortisone and zinc oxide 1 each 2 Past Week   omeprazole (PRILOSEC) 40 MG capsule TAKE (1) CAPSULE DAILY (Patient taking differently: Take 40 mg by mouth daily.) 90 capsule 0 10/20/2021   PARoxetine (PAXIL) 20 MG tablet TAKE ONE TABLET ONCE DAILY (Patient taking differently: Take 20 mg by mouth daily.) 90 tablet 0 10/23/2021   Potassium 99 MG TABS Take  1 tablet by mouth daily.   11/05/2021   pramipexole (MIRAPEX) 0.5 MG tablet Take 2 tablets (1 mg total) by mouth at bedtime. 90 tablet 3 11/07/2021   risperiDONE (RISPERDAL) 2 MG tablet Take 1 tablet (2 mg total) by mouth at bedtime. 90 tablet 3 11/07/2021   simvastatin (ZOCOR) 20 MG tablet TAKE ONE TABLET AT BEDTIME (Patient taking differently: Take 20 mg by mouth daily at 6 PM.) 90 tablet 0 11/07/2021   thiamine 100 MG tablet Take 1 tablet (100 mg total) by mouth daily. 30 tablet 2 11/10/2021    Assessment: 66 year old male transferred from [facility] to [facility] for stroke and cardiac work up. History of recent CVA. Patient with negative CT and MRI for bleeding. Positive troponins to start IV heparin. Not on anticoagulants prior to admission. He was taking aspirin 325 mg daily (was increased from 81 mg daily in August 2021 during admission for TIA w/ MRA head noting extensive intracranial atherosclerosis).  Morning labs: Hgb down slightly to 10.9, Plt 189  Heparin level remains  undetectable this afternoon after rate increase. Verified no issues with infusion today per RN. RN does endorse patient had some minor bleeding at other IV site, but resolved.   Goal of Therapy:  Heparin level 0.3-0.5 units/ml Monitor platelets by anticoagulation protocol: Yes    Plan:  Increase heparin infusion to 1000 units/hr Check anti-Xa level in 6-8 hours and daily while on heparin Continue to monitor H&H and platelets F/u anticoag and antiplatelet plans   Thank you for allowing [place] to participate in this patients care. Jens Som, PharmD 11/09/2021 3:38 PM  **Pharmacist phone directory can be found on [email or web address] listed under [place]**

## 2021-11-09 NOTE — Evaluation (Signed)
Clinical/Bedside Swallow Evaluation Patient Details  Name: AZAZEL EZERNACK MRN: [id number] Date of Birth: 03-03-1956  Today's Date: 11/09/2021 Time: SLP Start Time (ACUTE ONLY): 45 SLP Stop Time (ACUTE ONLY): 1000 SLP Time Calculation (min) (ACUTE ONLY): 15 min  Past Medical History:  Past Medical History:  Diagnosis Date   Anxiety    Carotid artery disease ([place])    Cerebral aneurysm    Closed head injury    COPD (chronic obstructive pulmonary disease) (HCC)    Depression    GERD (gastroesophageal reflux disease)    Hyperlipidemia    Hypertension    Seizures ([place])    Sleep apnea    Past Surgical History: History reviewed. No pertinent surgical history. HPI:  VAHAN SHINDLER is a 66 year old male admitted with progressive right greater than left weakness. MRI negative. Pt with past medical history of prior stroke HTN, HLD, CAD, COPD, obstructive sleep apnea, seizures, TBI, anxiety, depression. Pt has been seen in the past for moderate dysarthria and cognitive/memory impairment. MBS and esophagram in 2010 WNL.    Assessment / Plan / Recommendation  Clinical Impression  Pt demonstrates immediate and delayed coughing with thin and necar liquids, and subjectively seems to tolerate honey thick liquids with less coughing. Sister has reported that pt also coughs frequently at baseline when eating and drinking. Pt is noted to have hypernasal resonance and appearance of poor palatal elevation suggested a possible CN X deficit. Will initiate a conservative diet today and f/u tomorrow with MBS for instrumental assessment of swallowing. SLP Visit Diagnosis: Dysphagia, oropharyngeal phase (R13.12)    Aspiration Risk  Moderate aspiration risk    Diet Recommendation Dysphagia 3 (Mech soft);Honey-thick liquid   Liquid Administration via: Cup;Straw Medication Administration: Whole meds with puree Supervision: Patient able to self feed Compensations: Slow rate;Small sips/bites Postural  Changes: Seated upright at 90 degrees    Other  Recommendations Oral Care Recommendations: Oral care BID    Recommendations for follow up therapy are one component of a multi-disciplinary discharge planning process, led by the attending physician.  Recommendations may be updated based on patient status, additional functional criteria and insurance authorization.  Follow up Recommendations Home health SLP      Assistance Recommended at Discharge    Functional Status Assessment    Frequency and Duration min 2x/week  2 weeks       Prognosis Prognosis for Safe Diet Advancement: Fair      Swallow Study   General HPI: RILEN JERAULD is a 66 year old male admitted with progressive right greater than left weakness. MRI negative. Pt with past medical history of prior stroke HTN, HLD, CAD, COPD, obstructive sleep apnea, seizures, TBI, anxiety, depression. Pt has been seen in the past for moderate dysarthria and cognitive/memory impairment. MBS and esophagram in 2010 WNL. Type of Study: Bedside Swallow Evaluation Previous Swallow Assessment: none Diet Prior to this Study: NPO Temperature Spikes Noted: No Respiratory Status: Room air History of Recent Intubation: No Behavior/Cognition: Alert Oral Care Completed by SLP: No Oral Cavity - Dentition: Dentures, top;Dentures, bottom Vision: Functional for self-feeding Self-Feeding Abilities: Able to feed self Patient Positioning: Upright in bed Baseline Vocal Quality: Suspected CN X (Vagus) involvement Volitional Swallow: Able to elicit    Oral/Motor/Sensory Function Overall Oral Motor/Sensory Function: Mild impairment Facial Symmetry: Abnormal symmetry right Facial Strength: Reduced right Lingual ROM: Within Functional Limits Lingual Symmetry: Within Functional Limits Lingual Strength: Within Functional Limits Lingual Sensation: Within Functional Limits   Ice Chips  Thin Liquid Thin Liquid: Impaired Pharyngeal  Phase  Impairments: Cough - Immediate;Cough - Delayed    Nectar Thick     Honey Thick Honey Thick Liquid: Not tested   Puree Puree: Within functional limits   Solid     Solid: Within functional limits      Yesenia Locurto, Katherene Ponto 11/09/2021,10:02 AM

## 2021-11-09 NOTE — Progress Notes (Addendum)
eLink Physician-Brief Progress Note Patient Name: [name] DOB: 06/09/1956 MRN: [id number]   Date of Service  11/09/2021  HPI/Events of Note  65/M admitted with TIA, also with bilateral ICA stenosis, probably in setting of relatively low blood pressures, who was on the floors and went into respiratory distress after his meal.    Pt is now intubated in the ICU. Flexible bronchoscopy done at the bedside with no foreign body.  There was normal anatomy, with foamy secretions present bilaterally.   CXR with increased interstitial and perihilar airspace opacities suggesting edema.   BP 141/75, HR 140, RR 20, O2 sats 93%  eICU Interventions  Acute respiratory failure TIA Pulmonary edema Shock  Pt hypotensive despite peripheral levophed.  Start on neosynephrine peripherally. Goal SBP >130. PT may need central line placement. Insert arterial line.  Continue empiric antibiotics.  Continue heparin gtt.  Hold carvedilol.  Protonix for GI prophylaxis.       Intervention Category Evaluation Type: New Patient Evaluation  [name] 11/09/2021, 9:59 PM  12:02 AM Ground team informed of need for central line placement.  Pt is on 2 pressors at this time with BP still not at goal.   12:02 AM Pt also hyperglycemic with glucose 205.   Plan> Start on insulin sliding scale q4hrs.

## 2021-11-09 NOTE — Procedures (Signed)
Intubation Procedure Note  [name]  [id number]  May 06, 1956  Date:11/09/21  Time:10:31 PM   Provider Performing:Aliyanna Wassmer Mechele Collin, MD   Procedure: Intubation (31500)  Indication(s) Respiratory Failure  Consent Unable to obtain consent due to emergent nature of procedure.   Anesthesia Etomidate, Versed, Fentanyl, and Rocuronium   Time Out Verified patient identification, verified procedure, site/side was marked, verified correct patient position, special equipment/implants available, medications/allergies/relevant history reviewed, required imaging and test results available.   Sterile Technique Usual hand hygeine, masks, and gloves were used   Procedure Description Patient positioned in bed supine.  Sedation given as noted above.  Patient was intubated with endotracheal tube using Glidescope.  View was Grade 1 full glottis .  Number of attempts was 1.  Colorimetric CO2 detector was consistent with tracheal placement.   Complications/Tolerance Borderline hypotension with SBP 90s with fentanyl and versed. Started peripheral levophed prior to continuing. BVM with saturations in low 90s. Continued intubation after SBP ranged from 120-140s  Chest X-ray is ordered to verify placement.   EBL Minimal   Specimen(s) None

## 2021-11-09 NOTE — Progress Notes (Signed)
VASCULAR LAB    Carotid duplex has been performed.  See CV proc for preliminary results.   Reine Bristow, RVT 11/09/2021, 5:42 PM

## 2021-11-09 NOTE — Procedures (Signed)
Bronchoscopy Procedure Note  [name]  [id number]  11-25-1955  Date:11/09/21  Time:10:34 PM   Provider Performing:Lynnmarie Lovett Rodman Pickle, MD  Procedure(s):  Flexible Bronchoscopy [phone]) and Initial Therapeutic Aspiration of Tracheobronchial Tree [phone])  Indication(s) Acute hypoxemic respiratory failure  Consent Unable to obtain consent due to emergent nature of procedure.  Anesthesia Fentanyl   Time Out Verified patient identification, verified procedure, site/side was marked, verified correct patient position, special equipment/implants available, medications/allergies/relevant history reviewed, required imaging and test results available.   Sterile Technique Usual hand hygiene, masks, gowns, and gloves were used   Procedure Description Bronchoscope advanced through endotracheal tube and into airway.  Airways were examined down to subsegmental level with findings noted below.   Following diagnostic evaluation, BAL(s) performed in RUL with normal saline and return of cloudy fluid  Findings: Normal anatomy, mild erythema of mucosa throughout, foamy secretions present bilaterally that was suctioned and cleared from airways until subsegments could be visualized   Complications/Tolerance None; patient tolerated the procedure well. Chest X-ray is not needed post procedure.   EBL Minimal   Specimen(s) Respiratory culture

## 2021-11-09 NOTE — Consult Note (Signed)
Hospital Consult    Reason for Consult: Bilateral carotid stenosis Referring Physician: Dr. Thedore Mins MRN #:  [id number]  History of Present Illness This is a 66 y.o. male with past medical history of hypertension, hyperlipidemia known coronary artery disease, COPD, continued smoking history and previous upper extremity weakness that has been progressively worse on the right side.  He presented 2 days ago with right greater than left weakness and dysarthria.  He has undergone evaluation which MRI of the head is negative for any acute findings but does have bilateral right greater than left carotid stenosis which is highly calcified.  At this time he states that he is talking better.  He tells me that he lives with his sister and that he is unable to answer many of the questions and I will need to talk with her.  Much of this history taken from the chart.  Past Medical History:  Diagnosis Date   Anxiety    Carotid artery disease (HCC)    Cerebral aneurysm    Closed head injury    COPD (chronic obstructive pulmonary disease) (HCC)    Depression    GERD (gastroesophageal reflux disease)    Hyperlipidemia    Hypertension    Seizures (HCC)    Sleep apnea     History reviewed. No pertinent surgical history.  Allergies  Allergen Reactions   Codeine Rash    Prior to Admission medications   Medication Sig Start Date End Date Taking? Authorizing Provider  albuterol (VENTOLIN HFA) 108 (90 Base) MCG/ACT inhaler 2 PUFFS EVERY 6 HOURS AS NEEDED FOR WHEEZING OR SHORTNESS OF BREATH Patient taking differently: Inhale 2 puffs into the lungs every 6 (six) hours as needed for wheezing or shortness of breath. 11/20/19  Yes Dettinger, Elige Radon, MD  aspirin 325 MG tablet Take 325 mg by mouth daily.   Yes [provider]  busPIRone (BUSPAR) 10 MG tablet TAKE ONE TABLET THREE TIMES DAILY Patient taking differently: Take 10 mg by mouth 3 (three) times daily. 08/26/21  Yes Dettinger, Elige Radon, MD   ipratropium-albuterol (DUONEB) 0.5-2.5 (3) MG/3ML SOLN INHALE 3 MLS BY NEBULIZER 4 TIMES A DAY Patient taking differently: Take 3 mLs by nebulization every 6 (six) hours as needed (wheezing/shortness of breath). 09/22/21  Yes Dettinger, Elige Radon, MD  mupirocin ointment (BACTROBAN) 2 % PLACE 1 APPLICATION INTO THE NOSE 2 TIMES A DAY Patient taking differently: Apply 1 application. topically 2 (two) times daily. PLACE 1 APPLICATION INTO THE NOSE 2 TIMES A DAY 03/13/20  Yes Dettinger, Elige Radon, MD  MYRBETRIQ 25 MG TB24 tablet TAKE ONE TABLET ONCE DAILY Patient taking differently: Take 25 mg by mouth daily. 10/27/21  Yes Dettinger, Elige Radon, MD  niacin (NIASPAN) 1000 MG CR tablet TAKE 1 TABLET BY MOUTH AT BEDTIME. Patient taking differently: Take 1,000 mg by mouth at bedtime. 08/26/21  Yes Dettinger, Elige Radon, MD  Nystatin (GERHARDT'S BUTT CREAM) CREA Apply 1 application topically daily. Equal parts nystatin and hydrocortisone and zinc oxide 03/15/20  Yes Dettinger, Elige Radon, MD  omeprazole (PRILOSEC) 40 MG capsule TAKE (1) CAPSULE DAILY Patient taking differently: Take 40 mg by mouth daily. 08/26/21  Yes Dettinger, Elige Radon, MD  PARoxetine (PAXIL) 20 MG tablet TAKE ONE TABLET ONCE DAILY Patient taking differently: Take 20 mg by mouth daily. 08/26/21  Yes Dettinger, Elige Radon, MD  Potassium 99 MG TABS Take 1 tablet by mouth daily.   Yes [provider]  pramipexole (MIRAPEX) 0.5 MG tablet Take 2  tablets (1 mg total) by mouth at bedtime. 09/19/20  Yes Dettinger, Elige Radon, MD  risperiDONE (RISPERDAL) 2 MG tablet Take 1 tablet (2 mg total) by mouth at bedtime. 09/19/20  Yes Dettinger, Elige Radon, MD  simvastatin (ZOCOR) 20 MG tablet TAKE ONE TABLET AT BEDTIME Patient taking differently: Take 20 mg by mouth daily at 6 PM. 08/26/21  Yes Dettinger, Elige Radon, MD  thiamine 100 MG tablet Take 1 tablet (100 mg total) by mouth daily. 12/02/19  Yes Shon Hale, MD    Social History   Socioeconomic History    Marital status: Single    Spouse name: Not on file   Number of children: 1   Years of education: Not on file   Highest education level: Not on file  Occupational History   Occupation: disabled  Tobacco Use   Smoking status: Every Day    Packs/day: 1.00    Years: 50.00    Pack years: 50.00    Types: Cigarettes   Smokeless tobacco: Never  Vaping Use   Vaping Use: Never used  Substance and Sexual Activity   Alcohol use: No   Drug use: Yes    Types: Marijuana   Sexual activity: Not Currently  Other Topics Concern   Not on file  Social History Narrative   He lives with his sister. He has one son that he doesn't get along with very well.   Patient had a stroke that left him with speech difficulties and high fall risk   Social Determinants of Health   Financial Resource Strain: Low Risk    Difficulty of Paying Living Expenses: Not hard at all  Food Insecurity: No Food Insecurity   Worried About Programme researcher, broadcasting/film/video in the Last Year: Never true   Ran Out of Food in the Last Year: Never true  Transportation Needs: No Transportation Needs   Lack of Transportation (Medical): No   Lack of Transportation (Non-Medical): No  Physical Activity: Inactive   Days of Exercise per Week: 0 days   Minutes of Exercise per Session: 0 min  Stress: No Stress Concern Present   Feeling of Stress : Not at all  Social Connections: Socially Isolated   Frequency of Communication with Friends and Family: Never   Frequency of Social Gatherings with Friends and Family: Twice a week   Attends Religious Services: Never   Database administrator or Organizations: No   Attends Engineer, structural: Never   Marital Status: Never married  Catering manager Violence: Not At Risk   Fear of Current or Ex-Partner: No   Emotionally Abused: No   Physically Abused: No   Sexually Abused: No     Family History  Problem Relation Age of Onset   Obesity Sister     Review of Systems  Constitutional:  Negative.   HENT: Negative.    Eyes: Negative.   Respiratory: Negative.    Cardiovascular: Negative.   Gastrointestinal: Negative.   Skin: Negative.   Neurological:  Positive for focal weakness.  Endo/Heme/Allergies: Negative.   Psychiatric/Behavioral: Negative.       Physical Examination  Vitals:   11/09/21 0800 11/09/21 1230  BP: (!) 119/53 (!) 141/57  Pulse: 80 86  Resp: 18 20  Temp: (!) 97.5 F (36.4 C) 98.4 F (36.9 C)  SpO2: 92% 93%   Body mass index is 19.79 kg/m.  Physical Exam HENT:     Head: Normocephalic.     Nose: Nose normal.  Mouth/Throat:     Mouth: Mucous membranes are moist.  Cardiovascular:     Pulses:          Femoral pulses are 1+ on the right side and 0 on the left side.      Dorsalis pedis pulses are 0 on the right side and 0 on the left side.       Posterior tibial pulses are 0 on the right side and 0 on the left side.     Comments: Right common femoral pulse has been throughout consistent with a stenosis, I cannot reliably feel the left common femoral pulse Pulmonary:     Effort: Pulmonary effort is normal.  Abdominal:     General: Abdomen is flat.  Skin:    Comments: No wounds  Neurological:     General: No focal deficit present.     Mental Status: He is alert and oriented to person, place, and time.     CBC    Component Value Date/Time   WBC 11.8 (H) 11/09/2021 0624   RBC 3.32 (L) 11/09/2021 0624   HGB 10.9 (L) 11/09/2021 0624   HGB 12.2 (L) 10/15/2021 1443   HCT 33.7 (L) 11/09/2021 0624   HCT 35.4 (L) 10/15/2021 1443   PLT 189 11/09/2021 0624   PLT 221 10/15/2021 1443   MCV 101.5 (H) 11/09/2021 0624   MCV 98 (H) 10/15/2021 1443   MCH 32.8 11/09/2021 0624   MCHC 32.3 11/09/2021 0624   RDW 13.2 11/09/2021 0624   RDW 12.3 10/15/2021 1443   LYMPHSABS 1.1 02-11-22 1149   LYMPHSABS 1.5 10/15/2021 1443   MONOABS 1.3 (H) 02-11-22 1149   EOSABS 0.1 02-11-22 1149   EOSABS 0.0 10/15/2021 1443   BASOSABS 0.1 02-11-22  1149   BASOSABS 0.1 10/15/2021 1443    BMET    Component Value Date/Time   NA 136 11/09/2021 0624   NA 138 10/15/2021 1443   K 3.7 11/09/2021 0624   CL 103 11/09/2021 0624   CO2 23 11/09/2021 0624   GLUCOSE 88 11/09/2021 0624   BUN 17 11/09/2021 0624   BUN 19 10/15/2021 1443   CREATININE 0.80 11/09/2021 0624   CREATININE 0.80 11/04/2012 1308   CALCIUM 8.5 (L) 11/09/2021 0624   GFRNONAA >60 11/09/2021 0624   GFRNONAA >89 11/04/2012 1308   GFRAA >60 02/03/2020 0734   GFRAA >89 11/04/2012 1308    COAGS: Lab Results  Component Value Date   INR 1.1 02-11-22   INR 1.0 03/31/2007     Non-Invasive Vascular Imaging:   CTA head and neck IMPRESSION: 1. Severe bilateral proximal ICA stenosis due to advanced atherosclerosis. On the right a measurable channel is not visible and there is downstream underfilling. 2. 60% narrowing at the right proximal subclavian artery. The brachiocephalic origin was not covered on the scan. 3. Advanced narrowing at the origin of the non dominant left vertebral artery. 4. Moderate left M2 and right M1 segment stenoses. 5. Aortic Atherosclerosis (ICD10-I70.0) and Emphysema (ICD10-J43.9).    ASSESSMENT/PLAN: This is a 66 y.o. male with high-grade bilateral carotid artery stenosis previous duplex in June 2021 demonstrated similar high-grade stenosis right greater than left.  CT consistent with significant calcium possibly the right ICA is actually occluded.  I have ordered carotid duplex to determine if the right carotid is open and to further evaluate the left side.  Further recommendations pending carotid duplex in the patient with possible global hypoperfusion given the high-grade stenoses bilaterally.  He is also significantly  deconditioned and may be a poor operative candidate and would require discussion of severe risk with patient and his sister prior to any invasive procedures.  Raynor Calcaterra C. Randie Heinz, MD [facility] Office: [phone] Pager: [phone]

## 2021-11-09 NOTE — Progress Notes (Addendum)
PROGRESS NOTE                                                                                                                                                                                                             Patient Demographics:    [name], is a 66 y.o. male, DOB - 1955-11-29, PW:[id number]  Outpatient Primary MD for the patient is Dettinger, Fransisca Kaufmann, MD    LOS - 1  Admit date - 11/12/2021    Chief Complaint  Patient presents with   Code Stroke       Brief Narrative (HPI from H&P)  66 year old male with past medical history of prior stroke HTN, HLD, CAD, COPD, obstructive sleep apnea, seizures, TBI, anxiety, depression, Presented with slurred speech, bilateral right greater than left weakness since yesterday evening.  According to the family he was last seen normal about 1 PM on 10/13/2021.   He presented to [facility] ER where he had worsening of his right-sided weakness with new right facial droop, he also had evidence of NSTEMI.  He was sent to [facility] for further cardiology and neurology work-up.   Subjective:    [name] today has, No headache, No chest pain, No abdominal pain - No Nausea, No new weakness tingling or numbness, no SOB.   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  - Patient lives with his sister and according to the sister he has some chronic right-sided weakness but no facial droop or dysarthria, thankfully MRI brain is nonacute, there is also question of seizure.  Currently he is on dual antiplatelet therapy and statin, EEG has been ordered to rule out seizure, neurology on board Case discussed with neurology.  Full stroke work-up along with evaluation by PT, OT and speech.  Aspiration precautions.  He is quite frail and deconditioned at  baseline and long-term prognosis does not appear to be good.  Still smoking heavily.  2. NSTEMI (non-ST elevated myocardial infarction) ([place]) - chest pain-free, troponin trend is flat, EKG has nonspecific changes mostly due to underlying LVH and probably uncontrolled hypertension, currently on heparin drip along with DAPT and statin.  Echo pending.  If EF and wall motion are  preserved likely we can discontinue heparin drip will defer this to cardiology.  Case discussed with cardiology on 11/09/2021.   3. Hyperlipidemia  - Resuming high-dose statins   4.  Smoking.  Counseled to quit.    5. Generalized anxiety disorder  - Continue home medication including risperidone   6. Macrocytic anemia  - H&H stable, check TSH and B12.  7. Memory loss or impairment  - Stable currently home medication does not indicate dementia meds  8. CAD (coronary artery disease)  - Continue aspirin, statins, echo pending kindly see #2 above  9. Seizure disorder ([place])  - ?  Seizure, EEG ordered.  Neurology to follow Case discussed with neurology  10. GERD  - Continue Protonix  11. COPD (chronic obstructive pulmonary disease) (HCC)  - Currently stable, no signs of exacerbation, As needed DuoNeb, oxygen supplement  12. History of cerebral aneurysm  - - Currently stable, CT & MRI of the head unremarkable.   13. Essential hypertension  - Current home medication does not reflect any BP meds, Monitoring BP closely.  Will place on Coreg as CVA has been ruled out.  14. Depression, recurrent ([place]) - Resume home medication including risperidone,  BuSpar and Paxi          Condition - Extremely Guarded  Family Communication  :  sister [phone] - 11/09/21 >> DNR  Code Status :  Full >> DNR  Consults  :  Neuro, Cards  PUD Prophylaxis : PPI   Procedures  :     TTE -   MRI - No acute intracranial process. No evidence of acute or subacute infarct.       Disposition Plan  :    Status is: Inpatient  DVT  Prophylaxis  :  Heparin gtt  TED hose Start: 10/20/2021 1405 SCDs Start: 11/04/2021 1405    Lab Results  Component Value Date   PLT 189 11/09/2021    Diet :  Diet Order             DIET DYS 3 Room service appropriate? Yes; Fluid consistency: Honey Thick  Diet effective now                    Inpatient Medications  Scheduled Meds:  aspirin  324 mg Oral Once   aspirin  325 mg Oral Daily   busPIRone  10 mg Oral TID   clopidogrel  75 mg Oral Daily   melatonin  9 mg Oral QHS   mirabegron ER  25 mg Oral Daily   pantoprazole  40 mg Oral Daily   PARoxetine  20 mg Oral Daily   pramipexole  1 mg Oral QHS   risperiDONE  2 mg Oral QHS   simvastatin  40 mg Oral QHS   sodium chloride flush  3 mL Intravenous Q12H   Continuous Infusions:  sodium chloride 75 mL/hr at 11/09/21 [id number]   heparin 850 Units/hr (11/09/21 0828)   PRN Meds:.acetaminophen **OR** acetaminophen, bisacodyl, hydrALAZINE, HYDROmorphone (DILAUDID) injection, ipratropium, levalbuterol, nitroGLYCERIN, ondansetron **OR** ondansetron (ZOFRAN) IV, oxyCODONE, senna-docusate, traZODone  Antibiotics  :    Anti-infectives (From admission, onward)    None        Time Spent in minutes  30   Lala Lund M.D on 11/09/2021 at 10:23 AM  To page go to [email or web address]   [facility] -  Office  [phone]  See all Orders from today for further details    Objective:   Vitals:   10/31/2021 1856  10/29/2021 2000 11/09/21 0422 11/09/21 0800  BP: 121/88 (!) 125/50 (!) 128/55 (!) 119/53  Pulse: 90 87 90 80  Resp: (!) 22 (!) 21 20 18   Temp: 98.1 F (36.7 C) 97.9 F (36.6 C) 98 F (36.7 C) (!) 97.5 F (36.4 C)  TempSrc: Oral Oral Oral Oral  SpO2: 96% 98% 91% 92%  Weight: 57.3 kg     Height: 5\' 7"  (1.702 m)       Wt Readings from Last 3 Encounters:  10/19/2021 57.3 kg  11/06/21 56.7 kg  10/15/21 56.2 kg     Intake/Output Summary (Last 24 hours) at 11/09/2021 1023 Last data filed at 11/09/2021 [id number] Gross  per 24 hour  Intake 409.83 ml  Output --  Net 409.83 ml     Physical Exam  Awake Alert, R facial droop, [name],PERRAL Supple Neck, No JVD,   Symmetrical Chest wall movement, Good air movement bilaterally, CTAB RRR,No Gallops,Rubs or new Murmurs,  +ve B.Sounds, Abd Soft, No tenderness,   No Cyanosis, Clubbing or edema     RN pressure injury documentation: Pressure Injury 02/03/20 Coccyx Medial redness, flaky (Active)  02/03/20 1227  Location: Coccyx  Location Orientation: Medial  Staging:   Wound Description (Comments): redness, flaky  Present on Admission: Yes  Dressing Type Foam - Lift dressing to assess site every shift 11/02/2021 1932     Pressure Injury 11/12/2021 Sacrum Mid Stage 2 -  Partial thickness loss of dermis presenting as a shallow open injury with a red, pink wound bed without slough. (Active)  11/12/2021 1845  Location: Sacrum  Location Orientation: Mid  Staging: Stage 2 -  Partial thickness loss of dermis presenting as a shallow open injury with a red, pink wound bed without slough.  Wound Description (Comments):   Present on Admission: Yes     Data Review:    CBC Recent Labs  Lab 11/04/2021 1149 11/12/2021 1157 10/21/2021 1445 11/09/21 0624  WBC 14.5*  --  14.3* 11.8*  HGB 12.6* 13.9 11.8* 10.9*  HCT 38.3* 41.0 36.1* 33.7*  PLT 209  --  199 189  MCV 101.6*  --  102.0* 101.5*  MCH 33.4  --  33.3 32.8  MCHC 32.9  --  32.7 32.3  RDW 13.5  --  13.5 13.2  LYMPHSABS 1.1  --   --   --   MONOABS 1.3*  --   --   --   EOSABS 0.1  --   --   --   BASOSABS 0.1  --   --   --     Electrolytes Recent Labs  Lab 11/02/2021 1149 10/24/2021 1157 11/09/21 0624  NA 136 136 136  K 4.2 4.2 3.7  CL 102 98 103  CO2 29  --  23  GLUCOSE 114* 112* 88  BUN 21 21 17   CREATININE 0.70 0.60* 0.80  CALCIUM 9.2  --  8.5*  AST 32  --  29  ALT 22  --  19  ALKPHOS 69  --  56  BILITOT 1.0  --  1.4*  ALBUMIN 3.9  --  2.9*  MG  --   --  2.0  INR 1.1  --   --   HGBA1C  --   --   6.2*  BNP  --   --  620.9*    ------------------------------------------------------------------------------------------------------------------ Recent Labs    10/17/2021 1148  CHOL 127  HDL 64  LDLCALC 56  TRIG 34  CHOLHDL 2.0    Lab  Results  Component Value Date   HGBA1C 6.2 (H) 11/09/2021    No results for input(s): TSH, T4TOTAL, T3FREE, THYROIDAB in the last 72 hours.  Invalid input(s): FREET3 ------------------------------------------------------------------------------------------------------------------ ID Labs Recent Labs  Lab 11/09/2021 1149 10/21/2021 1157 10/29/2021 1445 11/09/21 0624  WBC 14.5*  --  14.3* 11.8*  PLT 209  --  199 189  CREATININE 0.70 0.60*  --  0.80    Radiology Reports MR BRAIN WO CONTRAST  Result Date: 10/17/2021 CLINICAL DATA:  Slurred speech bilateral weakness EXAM: MRI HEAD WITHOUT CONTRAST TECHNIQUE: Multiplanar, multiecho pulse sequences of the brain and surrounding structures were obtained without intravenous contrast. COMPARISON:  02/03/2020, correlation is also made with CT head 10/24/2021 FINDINGS: Evaluation is somewhat limited by motion artifact. Brain: No restricted diffusion to suggest acute or subacute infarct. No acute hemorrhage, mass, mass effect, or midline shift. No hydrocephalus or extra-axial collection. Cerebral volume loss is advanced for age. Confluent T2 hyperintense signal in the periventricular white matter and pons, likely the sequela of moderate to severe chronic small vessel ischemic disease. Redemonstrated lacunar infarcts in the thalami and basal ganglia. Redemonstrated hemosiderin deposition in the left midbrain, likely sequela of prior hypertensive microhemorrhage. Vascular: Normal arterial flow voids. Skull and upper cervical spine: Normal marrow signal. Sinuses/Orbits: Chronic bilateral maxillary sinusitis. Mucosal thickening in the ethmoid air cells. No acute finding in the orbits. Other: Trace fluid in left mastoid  tip. IMPRESSION: No acute intracranial process. No evidence of acute or subacute infarct. Electronically Signed   By: Merilyn Baba M.D.   On: 10/14/2021 21:21   DG Chest Port 1 View  Result Date: 11/09/2021 CLINICAL DATA:  Shortness of breath. EXAM: PORTABLE CHEST 1 VIEW COMPARISON:  12/03/2019 FINDINGS: 0558 hours. Rotated film. The lungs are clear without focal pneumonia, edema, pneumothorax or pleural effusion. Interstitial markings are diffusely coarsened with chronic features. Cardiopericardial silhouette is at upper limits of normal for size. Bones are diffusely demineralized. Telemetry leads overlie the chest. IMPRESSION: Chronic interstitial coarsening. No acute cardiopulmonary findings. Electronically Signed   By: Misty Stanley M.D.   On: 11/09/2021 06:18   CT HEAD CODE STROKE WO CONTRAST  Result Date: 10/14/2021 CLINICAL DATA:  Code stroke. Stroke suspected. Right-sided weakness for 23 hours EXAM: CT HEAD WITHOUT CONTRAST TECHNIQUE: Contiguous axial images were obtained from the base of the skull through the vertex without intravenous contrast. RADIATION DOSE REDUCTION: This exam was performed according to the departmental dose-optimization program which includes automated exposure control, adjustment of the mA and/or kV according to patient size and/or use of iterative reconstruction technique. COMPARISON:  09/14/2021 FINDINGS: Brain: No evidence of acute infarction, hemorrhage, hydrocephalus, extra-axial collection or mass lesion/mass effect. Multiple chronic lacunar infarcts at the bilateral deep gray nuclei, especially the bilateral thalamus. 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 Premature chronic small vessel ischemia. Confluence clinic gliosis in the cerebral white matter. Vascular: No hyperdense vessel or unexpected calcification. Skull: Normal. Negative for fracture or focal lesion. Prior burr holes on the left. Sinuses/Orbits: Chronic bilateral maxillary  sinusitis with left more than right opacification and sclerotic wall thickening. ASPECTS [facility] Stroke Program Early CT Score) - Ganglionic level infarction (caudate, lentiform nuclei, internal capsule, insula, M1-M3 cortex): 7 - Supraganglionic infarction (M4-M6 cortex): 3 Total score (0-10 with 10 being normal): 10 IMPRESSION: 1. No acute finding. 2. Advanced chronic small vessel ischemia. Generalized cerebral atrophy. 3. Chronic sinusitis. Electronically Signed   By: Jorje Guild M.D.   On: 11/01/2021 12:04

## 2021-11-09 NOTE — Progress Notes (Signed)
  Echocardiogram 2D Echocardiogram has been performed.  [name] 11/09/2021, 12:29 PM

## 2021-11-09 NOTE — Significant Event (Signed)
Patient was noticed to be acutely in respiratory distress after having his dinner.  Likely aspirated food.  Chest x-ray was ordered stat which shows some pulmonary congestion.  On exam at bedside patient is in respiratory distress confused tachycardic with blood pressure systolic in the [id number] heart rate in the 150s.  Patient was saturating more than 90% on nonrebreather.  On exam chest has bilateral coarse breathing sounds.  I discussed with patient's sister who is healthcare power of attorney Ms. Locklear who at this time advised to change DNR status to full code and if needed to intubate.  I consulted pulmonary critical care patient is being moved to ICU will likely may need intubation.  Appreciate critical care consult.  Dr. Hal Hope

## 2021-11-09 NOTE — Consult Note (Signed)
NAME:  [name], MRN:  [id number], DOB:  [birth year], LOS: 1 ADMISSION DATE:  10/23/2021, CONSULTATION DATE:  11/09/21 REFERRING MD:  TRH CHIEF COMPLAINT:  Respiratory distress   History of Present Illness:  66 year old male active smoker with PMHx as noted below admitted for TIA secondary to bilateral ICA stenosis. Initially presened to [facility] with slurred speech and R>L sided weakness. Code stroke called. Also concerned for NSTEMI so transferred to [facility] Per bedside RN and Neurology note today he has returned to baseline and he was able to follow commands with residual dysarthria.  Overnight he had choked on dinner. Bedside RN suctioned and some food removed. He was originally on room air and desatted requiring NRB and in distress. Previously able to follow commands and mainly dysarthric but now confused and in respiratory distress. CXR with pulmonary edema. Primary team contacted family and family reversed code status to full code. 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 Transferred to [facility] 5/28 - Returned to baseline during day except for dysarthria. Started on dysphagia diet  Interim History / Subjective:  As above  Objective   Blood pressure (!) 119/56, pulse 83, temperature 98.1 F (36.7 C), temperature source Oral, resp. rate 20, height 5\' 7"  (1.702 m), weight 57.3 kg, SpO2 95 %.        Intake/Output Summary (Last 24 hours) at 11/09/2021 2125 Last data filed at 11/09/2021 1800 Gross per 24 hour  Intake 1413.99 ml  Output 1000 ml  Net 413.99 ml   Filed Weights   10/26/2021 1245 11/11/2021 1856  Weight: 56.7 kg 57.3 kg    Physical Exam: General: Critically ill-appearing, confused, respiratory distress HENT: [place], AT, OP  clear, MMM, on NRB Eyes: EOMI, no scleral icterus Respiratory: Bilateral rhonchi, no wheezing Cardiovascular: Tachycardic, RR, -M/R/G, no JVD GI: BS+, soft, nontender Extremities:-Edema,-tenderness Neuro: Confused, unable to follow commands, moves extremities x 4  Resolved Hospital Problem list     Assessment & Plan:  Acute hypoxemic respiratory failure secondary to aspiration, flash pulmonary edema Acute encephalopathy secondary to above in setting of critical illness Transferred to ICU for intubation Bedside bronch demonstrated findings consistent with pulmonary edema, no foreign body Full vent support  F/u ABG, CXR S/p lasix 20 mg Start unasyn VAP  Sedation related hypotension Peripheral levophed for SBP goal 130-160 per Neuro Wean vasopressors  TIA Bilateral carotid stenosis HTN SBP goal 130-160 Vascular consulted. F/u carotid duplex  NSTEMI CAD HLD Cardiology following DAPT, Heparin gtt Statin  OSA COPD On CPAP at home when stable  Hx TBI with associated seizure No AEDS  Anxiety Paxil  GOC Continue GOC. Family reversed DNR prior to transfer 5/28  Best Practice (right click and "Reselect all SmartList Selections" daily)   Diet/type: NPO DVT prophylaxis: systemic heparin GI prophylaxis: PPI Lines: N/A Foley:  N/A Code Status:  full code Family reversed DNR prior to transfer Last date of multidisciplinary goals of care discussion [5/28 with TRH]   Labs   CBC: Recent Labs  Lab 11/09/2021 1149 10/28/2021 1157 10/25/2021 1445 11/09/21 0624  WBC 14.5*  --  14.3* 11.8*  NEUTROABS 11.9*  --   --   --   HGB 12.6* 13.9 11.8* 10.9*  HCT 38.3* 41.0 36.1* 33.7*  MCV 101.6*  --  102.0* 101.5*  PLT 209  --  199 [id number]    Basic Metabolic Panel: Recent Labs  Lab 11/07/2021 1149 11/06/2021 1157 11/09/21 0624  NA 136 136 136  K 4.2 4.2 3.7  CL 102 98 103  CO2 29  --  23  GLUCOSE 114* 112* 88  BUN 21 21 17   CREATININE 0.70 0.60* 0.80  CALCIUM 9.2  --   8.5*  MG  --   --  2.0   GFR: Estimated Creatinine Clearance: 72.6 mL/min (by C-G formula based on SCr of 0.8 mg/dL). Recent Labs  Lab 10/23/2021 1149 10/24/2021 1445 11/09/21 0624  WBC 14.5* 14.3* 11.8*    Liver Function Tests: Recent Labs  Lab 10/16/2021 1149 11/09/21 0624  AST 32 29  ALT 22 19  ALKPHOS 69 56  BILITOT 1.0 1.4*  PROT 7.2 5.7*  ALBUMIN 3.9 2.9*   No results for input(s): LIPASE, AMYLASE in the last 168 hours. No results for input(s): AMMONIA in the last 168 hours.  ABG    Component Value Date/Time   PHART 7.443 01/04/2008 0320   PCO2ART 34.6 (L) 01/04/2008 0320   PO2ART 79.2 (L) 01/04/2008 0320   HCO3 23.3 01/04/2008 0320   TCO2 30 11/01/2021 1157   ACIDBASEDEF 0.3 01/04/2008 0320   O2SAT 96.1 01/04/2008 0320     Coagulation Profile: Recent Labs  Lab 10/31/2021 1149  INR 1.1    Cardiac Enzymes: No results for input(s): CKTOTAL, CKMB, CKMBINDEX, TROPONINI in the last 168 hours.  HbA1C: Hgb A1c MFr Bld  Date/Time Value Ref Range Status  11/09/2021 06:24 AM 6.2 (H) 4.8 - 5.6 % Final    Comment:    (NOTE) Pre diabetes:          5.7%-6.4%  Diabetes:              >6.4%  Glycemic control for   <7.0% adults with diabetes   02/03/2020 07:34 AM 6.1 (H) 4.8 - 5.6 % Final    Comment:    (NOTE) Pre diabetes:          5.7%-6.4%  Diabetes:              >6.4%  Glycemic control for   <7.0% adults with diabetes     CBG: No results for input(s): GLUCAP in the last 168 hours.  Review of Systems:   Unable to obtain due to critical illness  Past Medical History:  He,  has a past medical history of Anxiety, Carotid artery disease ([place]), Cerebral aneurysm, Closed head injury, COPD (chronic obstructive pulmonary disease) ([place]), Depression, GERD (gastroesophageal reflux disease), Hyperlipidemia, Hypertension, Seizures ([place]), and Sleep apnea.   Surgical History:  History reviewed. No pertinent surgical history.   Social History:   reports that he  has been smoking cigarettes. He has a 50.00 pack-year smoking history. He has never used smokeless tobacco. He reports current drug use. Drug: Marijuana. He reports that he does not drink alcohol.   Family History:  His family history includes Obesity in his sister.   Allergies Allergies  Allergen Reactions   Codeine Rash     Home Medications  Prior to Admission medications   Medication Sig Start Date End Date Taking? Authorizing Provider  albuterol (VENTOLIN HFA) 108 (90 Base) MCG/ACT inhaler 2 PUFFS EVERY 6 HOURS AS NEEDED FOR WHEEZING OR SHORTNESS OF BREATH Patient taking differently: Inhale 2 puffs into the lungs every 6 (six) hours as needed for wheezing or shortness of  breath. 11/20/19  Yes Dettinger, Fransisca Kaufmann, MD  aspirin 325 MG tablet Take 325 mg by mouth daily.   Yes [provider]  busPIRone (BUSPAR) 10 MG tablet TAKE ONE TABLET THREE TIMES DAILY Patient taking differently: Take 10 mg by mouth 3 (three) times daily. 08/26/21  Yes Dettinger, Fransisca Kaufmann, MD  ipratropium-albuterol (DUONEB) 0.5-2.5 (3) MG/3ML SOLN INHALE 3 MLS BY NEBULIZER 4 TIMES A DAY Patient taking differently: Take 3 mLs by nebulization every 6 (six) hours as needed (wheezing/shortness of breath). 09/22/21  Yes Dettinger, Fransisca Kaufmann, MD  mupirocin ointment (BACTROBAN) 2 % PLACE 1 APPLICATION INTO THE NOSE 2 TIMES A DAY Patient taking differently: Apply 1 application. topically 2 (two) times daily. PLACE 1 APPLICATION INTO THE NOSE 2 TIMES A DAY 03/13/20  Yes Dettinger, Fransisca Kaufmann, MD  MYRBETRIQ 25 MG TB24 tablet TAKE ONE TABLET ONCE DAILY Patient taking differently: Take 25 mg by mouth daily. 10/27/21  Yes Dettinger, Fransisca Kaufmann, MD  niacin (NIASPAN) 1000 MG CR tablet TAKE 1 TABLET BY MOUTH AT BEDTIME. Patient taking differently: Take 1,000 mg by mouth at bedtime. 08/26/21  Yes Dettinger, Fransisca Kaufmann, MD  Nystatin (GERHARDT'S BUTT CREAM) CREA Apply 1 application topically daily. Equal parts nystatin and hydrocortisone and  zinc oxide 03/15/20  Yes Dettinger, Fransisca Kaufmann, MD  omeprazole (PRILOSEC) 40 MG capsule TAKE (1) CAPSULE DAILY Patient taking differently: Take 40 mg by mouth daily. 08/26/21  Yes Dettinger, Fransisca Kaufmann, MD  PARoxetine (PAXIL) 20 MG tablet TAKE ONE TABLET ONCE DAILY Patient taking differently: Take 20 mg by mouth daily. 08/26/21  Yes Dettinger, Fransisca Kaufmann, MD  Potassium 99 MG TABS Take 1 tablet by mouth daily.   Yes [provider]  pramipexole (MIRAPEX) 0.5 MG tablet Take 2 tablets (1 mg total) by mouth at bedtime. 09/19/20  Yes Dettinger, Fransisca Kaufmann, MD  risperiDONE (RISPERDAL) 2 MG tablet Take 1 tablet (2 mg total) by mouth at bedtime. 09/19/20  Yes Dettinger, Fransisca Kaufmann, MD  simvastatin (ZOCOR) 20 MG tablet TAKE ONE TABLET AT BEDTIME Patient taking differently: Take 20 mg by mouth daily at 6 PM. 08/26/21  Yes Dettinger, Fransisca Kaufmann, MD  thiamine 100 MG tablet Take 1 tablet (100 mg total) by mouth daily. 12/02/19  Yes Roxan Hockey, MD     Critical care time: 60 min    The patient is critically ill with multiple organ systems failure and requires high complexity decision making for assessment and support, frequent evaluation and titration of therapies, application of advanced monitoring technologies and extensive interpretation of multiple databases.    Rodman Pickle, M.D. [facility] Pulmonary/Critical Care Medicine 11/09/2021 9:25 PM   Please see Amion for pager number to reach on-call Pulmonary and Critical Care Team.

## 2021-11-09 NOTE — Progress Notes (Signed)
Pt arrived from 5W30 with the following belongings:  1 watch 1 pair of jean pants 1 t-shirt 1 pair of shoes 1 black wallet chain attached to jeans 1 pink denture cup with upper and lower dentures

## 2021-11-09 NOTE — Progress Notes (Signed)
STROKE TEAM PROGRESS NOTE   SUBJECTIVE (INTERVAL HISTORY) No family is at the bedside.  Overall his condition is stable. He still has severe dysarthria but he stated that is his baseline. He felt he is pretty much back to his baseline. MRI neg, however, CTA head and neck showed severe stenosis b/l ICA bulb, right more than left. His BP 110s-130s most of the time, recommend to avoid low BP. He still smoking, education provided.    OBJECTIVE Temp:  [97.5 F (36.4 C)-98.4 F (36.9 C)] 98.4 F (36.9 C) (05/28 1230) Pulse Rate:  [35-94] 86 (05/28 1230) Cardiac Rhythm: Normal sinus rhythm (05/28 0700) Resp:  [18-35] 20 (05/28 1230) BP: (118-141)/(48-88) 141/57 (05/28 1230) SpO2:  [80 %-100 %] 93 % (05/28 1230) Weight:  [57.3 kg] 57.3 kg (05/27 1856)  No results for input(s): GLUCAP in the last 168 hours. Recent Labs  Lab 10/13/2021 1149 11/05/2021 1157 11/09/21 0624  NA 136 136 136  K 4.2 4.2 3.7  CL 102 98 103  CO2 29  --  23  GLUCOSE 114* 112* 88  BUN 21 21 17   CREATININE 0.70 0.60* 0.80  CALCIUM 9.2  --  8.5*  MG  --   --  2.0   Recent Labs  Lab 10/30/2021 1149 11/09/21 0624  AST 32 29  ALT 22 19  ALKPHOS 69 56  BILITOT 1.0 1.4*  PROT 7.2 5.7*  ALBUMIN 3.9 2.9*   Recent Labs  Lab 11/12/2021 1149 10/29/2021 1157 10/28/2021 1445 11/09/21 0624  WBC 14.5*  --  14.3* 11.8*  NEUTROABS 11.9*  --   --   --   HGB 12.6* 13.9 11.8* 10.9*  HCT 38.3* 41.0 36.1* 33.7*  MCV 101.6*  --  102.0* 101.5*  PLT 209  --  199 189   No results for input(s): CKTOTAL, CKMB, CKMBINDEX, TROPONINI in the last 168 hours. Recent Labs    11/03/2021 1149  LABPROT 14.2  INR 1.1   Recent Labs    10/18/2021 1608  COLORURINE YELLOW  LABSPEC 1.025  PHURINE 5.0  GLUCOSEU NEGATIVE  HGBUR SMALL*  BILIRUBINUR NEGATIVE  KETONESUR 20*  PROTEINUR 30*  NITRITE NEGATIVE  LEUKOCYTESUR NEGATIVE       Component Value Date/Time   CHOL 127 10/27/2021 1148   CHOL 125 10/15/2021 1443   TRIG 34 10/15/2021  1148   TRIG 101 10/03/2013 1654   HDL 64 10/26/2021 1148   HDL 63 10/15/2021 1443   HDL 64 10/03/2013 1654   CHOLHDL 2.0 11/04/2021 1148   VLDL 7 11/03/2021 1148   LDLCALC 56 10/14/2021 1148   LDLCALC 52 10/15/2021 1443   LDLCALC 45 10/03/2013 1654   Lab Results  Component Value Date   HGBA1C 6.2 (H) 11/09/2021      Component Value Date/Time   LABOPIA NONE DETECTED 10/15/2021 1608   COCAINSCRNUR NONE DETECTED 10/26/2021 1608   LABBENZ NONE DETECTED 10/15/2021 1608   AMPHETMU NONE DETECTED 10/26/2021 1608   THCU NONE DETECTED 10/28/2021 1608   LABBARB NONE DETECTED 10/16/2021 1608    Recent Labs  Lab 10/23/2021 1149  ETH <10    I have personally reviewed the radiological images below and agree with the radiology interpretations.  CT ANGIO HEAD NECK W WO CM  Result Date: 11/09/2021 CLINICAL DATA:  Stroke workup EXAM: CT ANGIOGRAPHY HEAD AND NECK TECHNIQUE: Multidetector CT imaging of the head and neck was performed using the standard protocol during bolus administration of intravenous contrast. Multiplanar CT image reconstructions and MIPs  were obtained to evaluate the vascular anatomy. Carotid stenosis measurements (when applicable) are obtained utilizing NASCET criteria, using the distal internal carotid diameter as the denominator. RADIATION DOSE REDUCTION: This exam was performed according to the departmental dose-optimization program which includes automated exposure control, adjustment of the mA and/or kV according to patient size and/or use of iterative reconstruction technique. CONTRAST:  122mL OMNIPAQUE IOHEXOL 350 MG/ML SOLN COMPARISON:  Stroke MRI from yesterday FINDINGS: CT HEAD FINDINGS Brain: No evidence of acute infarction, hemorrhage, hydrocephalus, extra-axial collection or mass lesion/mass effect. Chronic small vessel ischemia in the deep white matter with chronic lacunar/perforator infarcts at the left deep gray nuclei and right caudate head. Generalized cerebral  volume loss. Vascular: No hyperdense vessel or unexpected calcification. Skull: Remote left-sided burr holes.  No acute or aggressive finding Sinuses: Extensive bilateral sinus opacification at the level of the maxillary and asymmetric left ethmoid sinuses. Orbits: No acute finding Review of the MIP images confirms the above findings CTA NECK FINDINGS Aortic arch: Minimal coverage, the brachiocephalic artery origin is not seen. Atherosclerosis. Right carotid system: Diffuse calcified plaque with severe bulky plaque at the bifurcation completely obscuring a patent lumen. The downstream right ICA is under filled compared to the left. Left carotid system: Calcified plaque primarily at the common carotid origin and bifurcation. A mixed density plaque at the left ICA origin has a web-like component causing over 70% stenosis. The left ECA origin is occluded with downstream reconstitution. Vertebral arteries: Calcified plaque involving both proximal subclavian arteries. Proximal right subclavian stenosis measures 60%. Calcified plaque at both V1 segments with high-grade narrowing on the left. The right vertebral artery is dominant. No dissection or beading Skeleton: Generalized cervical spine degeneration which is advanced. Other neck: No acute finding Upper chest: Severe emphysema at the apices. Review of the MIP images confirms the above findings CTA HEAD FINDINGS Anterior circulation: Extensive calcified plaque along the bilateral carotid siphons to the degree that calcified plaque over estimates the diffuse narrowed appearance. Mild to moderate bilateral ICA stenosis is possible. No major branch occlusion, beading, or aneurysm. Bilateral atheromatous narrowing of MCA branches with moderate left M2 and right M1 stenosis see seen on coronal thick MIPS. Narrow left A1 segment which is likely both acquired and congenital. Posterior circulation: The vertebral and basilar arteries are smoothly contoured and diffusely patent.  No branch occlusion, beading, or aneurysm. Venous sinuses: Unremarkable Anatomic variants: None significant Review of the MIP images confirms the above findings IMPRESSION: 1. Severe bilateral proximal ICA stenosis due to advanced atherosclerosis. On the right a measurable channel is not visible and there is downstream underfilling. 2. 60% narrowing at the right proximal subclavian artery. The brachiocephalic origin was not covered on the scan. 3. Advanced narrowing at the origin of the non dominant left vertebral artery. 4. Moderate left M2 and right M1 segment stenoses. 5. Aortic Atherosclerosis (ICD10-I70.0) and Emphysema (ICD10-J43.9). Electronically Signed   By: Jorje Guild M.D.   On: 11/09/2021 11:47   MR BRAIN WO CONTRAST  Result Date: 11/06/2021 CLINICAL DATA:  Slurred speech bilateral weakness EXAM: MRI HEAD WITHOUT CONTRAST TECHNIQUE: Multiplanar, multiecho pulse sequences of the brain and surrounding structures were obtained without intravenous contrast. COMPARISON:  02/03/2020, correlation is also made with CT head 11/02/2021 FINDINGS: Evaluation is somewhat limited by motion artifact. Brain: No restricted diffusion to suggest acute or subacute infarct. No acute hemorrhage, mass, mass effect, or midline shift. No hydrocephalus or extra-axial collection. Cerebral volume loss is advanced for age. Confluent T2 hyperintense  signal in the periventricular white matter and pons, likely the sequela of moderate to severe chronic small vessel ischemic disease. Redemonstrated lacunar infarcts in the thalami and basal ganglia. Redemonstrated hemosiderin deposition in the left midbrain, likely sequela of prior hypertensive microhemorrhage. Vascular: Normal arterial flow voids. Skull and upper cervical spine: Normal marrow signal. Sinuses/Orbits: Chronic bilateral maxillary sinusitis. Mucosal thickening in the ethmoid air cells. No acute finding in the orbits. Other: Trace fluid in left mastoid tip.  IMPRESSION: No acute intracranial process. No evidence of acute or subacute infarct. Electronically Signed   By: Merilyn Baba M.D.   On: 11/05/2021 21:21   DG Chest Port 1 View  Result Date: 11/09/2021 CLINICAL DATA:  Shortness of breath. EXAM: PORTABLE CHEST 1 VIEW COMPARISON:  12/03/2019 FINDINGS: 0558 hours. Rotated film. The lungs are clear without focal pneumonia, edema, pneumothorax or pleural effusion. Interstitial markings are diffusely coarsened with chronic features. Cardiopericardial silhouette is at upper limits of normal for size. Bones are diffusely demineralized. Telemetry leads overlie the chest. IMPRESSION: Chronic interstitial coarsening. No acute cardiopulmonary findings. Electronically Signed   By: Misty Stanley M.D.   On: 11/09/2021 06:18   CT HEAD CODE STROKE WO CONTRAST  Result Date: 10/17/2021 CLINICAL DATA:  Code stroke. Stroke suspected. Right-sided weakness for 23 hours EXAM: CT HEAD WITHOUT CONTRAST TECHNIQUE: Contiguous axial images were obtained from the base of the skull through the vertex without intravenous contrast. RADIATION DOSE REDUCTION: This exam was performed according to the departmental dose-optimization program which includes automated exposure control, adjustment of the mA and/or kV according to patient size and/or use of iterative reconstruction technique. COMPARISON:  09/14/2021 FINDINGS: Brain: No evidence of acute infarction, hemorrhage, hydrocephalus, extra-axial collection or mass lesion/mass effect. Multiple chronic lacunar infarcts at the bilateral deep gray nuclei, especially the bilateral thalamus. 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 Premature chronic small vessel ischemia. Confluence clinic gliosis in the cerebral white matter. Vascular: No hyperdense vessel or unexpected calcification. Skull: Normal. Negative for fracture or focal lesion. Prior burr holes on the left. Sinuses/Orbits: Chronic bilateral maxillary sinusitis  with left more than right opacification and sclerotic wall thickening. ASPECTS [facility] Stroke Program Early CT Score) - Ganglionic level infarction (caudate, lentiform nuclei, internal capsule, insula, M1-M3 cortex): 7 - Supraganglionic infarction (M4-M6 cortex): 3 Total score (0-10 with 10 being normal): 10 IMPRESSION: 1. No acute finding. 2. Advanced chronic small vessel ischemia. Generalized cerebral atrophy. 3. Chronic sinusitis. Electronically Signed   By: Jorje Guild M.D.   On: 10/18/2021 12:04     PHYSICAL EXAM  Temp:  [97.5 F (36.4 C)-98.4 F (36.9 C)] 98.4 F (36.9 C) (05/28 1230) Pulse Rate:  [35-94] 86 (05/28 1230) Resp:  [18-35] 20 (05/28 1230) BP: (118-141)/(48-88) 141/57 (05/28 1230) SpO2:  [80 %-100 %] 93 % (05/28 1230) Weight:  [57.3 kg] 57.3 kg (05/27 1856)  General - Well nourished, well developed, in no apparent distress.  Ophthalmologic - fundi not visualized due to noncooperation.  Cardiovascular - Regular rhythm and rate.  Neuro - awake, alert, eyes open, orientated to age, place, but not to time. No aphasia, paucity of speech but severe dysarthria, following all simple commands. Able to name and repeat with dysarthric voice. No gaze palsy, tracking bilaterally, visual field full. Chronic left facial droop. Tongue midline. Bilateral UEs 5/5, no drift. Bilaterally LEs 5/5, no drift. Sensation symmetrical bilaterally, b/l FTN ataxic L>R, gait not tested.    ASSESSMENT/PLAN Mr. MONDELL KOLK is a 66  y.o. male with history of TBI with chronic dysarthria, seizure, hypertension, hyperlipidemia, CAD, OSA, carotid stenosis bilaterally admitted for worsening dysarthria, bilateral leg weakness. No tPA given due to outside window.    TIA likely due to bilateral ICA stenosis in the setting of soft BP CT no acute abnormality  MRI no acute infarct CT head and neck severe bilateral proximal ICA stenosis, right more than left, and decreased downstream flow on the  right.  Bilateral ICA siphon moderate to severe stenosis.  Moderate left M2 and right M1 segment stenosis.  60% right proximal subclavian artery stenosis.  Left VA origin stenosis. Carotid Doppler pending 2D Echo EF 60 to 65% EEG pending LDL 56 HgbA1c 6.2 Heparin IV for VTE prophylaxis aspirin 325 mg daily prior to admission, now on heparin IV for non-STEMI. Ongoing aggressive stroke risk factor management Therapy recommendations: SNF Disposition: Pending  History of TIA/seizure Remote seizure from TBI 01/2020 admitted for worsening dysarthria.  EEG negative.  CT/MRI/MRA negative.  However, carotid Doppler showed bilateral ICA more than 70% stenosis, right more than left.  EF 55 to 60%.  Discharged on aspirin 325.  Bilateral carotid stenosis 01/2020 carotid Doppler showed bilateral ICA > 70% stenosis, right more than left CT head and neck severe bilateral proximal ICA stenosis, right more than left, and decreased downstream flow on the right.  Carotid Doppler pending Vascular surgery Dr. Donzetta Matters on board  Hypertension Stable on the soft side Avoid low BP given bilateral extracranial carotid stenosis and intracranial stenosis Long term BP goal 1 30-1 60 before carotid revascularization  Hyperlipidemia Home meds: Zocor 20 LDL 56, goal < 70 Now on Zocor 20 given LDL at goal Continue statin at discharge  Other Stroke Risk Factors Advanced age Obstructive sleep apnea, on CPAP at home Coronary artery disease  Other Active Problems History of TBI with residual of severe dysarthria  Hospital day # 1    Rosalin Hawking, MD PhD Stroke Neurology 11/09/2021 12:52 PM    To contact Stroke Continuity provider, please refer to [email or web address] After hours, contact General Neurology

## 2021-11-09 NOTE — Evaluation (Signed)
Physical Therapy Evaluation Patient Details Name: [name] MRN: [id number] DOB: [birth year] Today's Date: 11/09/2021  History of Present Illness  66 year old male Presented 10/25/2021 with slurred speech, bilateral right greater than left weakness. CT head and MRI brain negative; elevated troponins with NSTEMI.  PMH prior stroke HTN, HLD, CAD, COPD, obstructive sleep apnea, seizures, TBI, anxiety, depression,  Clinical Impression   Pt admitted secondary to problem above with deficits below. PTA patient was living with sister and reports he was independent with his walking with no device.  Pt currently requires mod assist for bed mobility and transfers and will require +2 for gait due to lines/tubes and unsteadiness. If patient progresses quickly and sister can provide 24/7 supervision/assist, he could potentially go home. However, currently he reports sister is "in and out" during the day.  Anticipate patient will benefit from PT to address problems listed below.Will continue to follow acutely to maximize functional mobility independence and safety.          Recommendations for follow up therapy are one component of a multi-disciplinary discharge planning process, led by the attending physician.  Recommendations may be updated based on patient status, additional functional criteria and insurance authorization.  Follow Up Recommendations Skilled nursing-short term rehab (<3 hours/day)    Assistance Recommended at Discharge Frequent or constant Supervision/Assistance  Patient can return home with the following  Two people to help with walking and/or transfers;Assistance with cooking/housework;Direct supervision/assist for medications management;Direct supervision/assist for financial management;Assist for transportation;Help with stairs or ramp for entrance    Equipment Recommendations None recommended by PT  Recommendations for Other Services  OT consult;Speech consult    Functional  Status Assessment Patient has had a recent decline in their functional status and demonstrates the ability to make significant improvements in function in a reasonable and predictable amount of time.     Precautions / Restrictions Precautions Precautions: Fall      Mobility  Bed Mobility Overal bed mobility: Needs Assistance Bed Mobility: Supine to Sit, Sit to Supine     Supine to sit: Mod assist, HOB elevated Sit to supine: Min guard   General bed mobility comments: pt able to get legs over eOB, attempted to raise torso with use of rail and required mod assist to come to sit; return to supine with guarding assist for safety    Transfers Overall transfer level: Needs assistance Equipment used: None Transfers: Sit to/from Stand Sit to Stand: Mod assist, Min assist           General transfer comment: initial sit to stand with posterior lean and mod assist to recover balance; second attempt with min assist with less posterior lean    Ambulation/Gait             Pre-gait activities: side step to rt with mod assist due to posterior lean General Gait Details: unable to safely complete with +1 assist, posterior lean and lines/tubes  Stairs            Wheelchair Mobility    Modified Rankin (Stroke Patients Only) Modified Rankin (Stroke Patients Only) Pre-Morbid Rankin Score: Slight disability Modified Rankin: Moderately severe disability     Balance Overall balance assessment: Needs assistance Sitting-balance support: No upper extremity supported, Feet supported Sitting balance-Leahy Scale: Poor Sitting balance - Comments: some ataxia noted in sitting   Standing balance support: No upper extremity supported Standing balance-Leahy Scale: Poor Standing balance comment: posterior lean  Pertinent Vitals/Pain Pain Assessment Pain Assessment: No/denies pain    Home Living Family/patient expects to be discharged to::  Private residence Living Arrangements: Other relatives (sister) Available Help at Discharge: Family;Available PRN/intermittently Type of Home: Mobile home Home Access: Ramped entrance       Home Layout: One level Home Equipment: Conservation officer, nature (2 wheels);Cane - single point;Shower seat;Other (comment) (lift chair)      Prior Function Prior Level of Function : Independent/Modified Independent               ADLs Comments: assist with cooking, cleaning; sister does grocery shopping     Hand Dominance   Dominant Hand: Left    Extremity/Trunk Assessment   Upper Extremity Assessment Upper Extremity Assessment: Defer to OT evaluation    Lower Extremity Assessment Lower Extremity Assessment: RLE deficits/detail;LLE deficits/detail RLE Deficits / Details: knee extension 3+, ankle DF (unable; pt supinates), 1st toe extension 4/5 RLE Coordination: decreased gross motor (jerky/ataxic movements) LLE Deficits / Details: knee extension 4/5, ankle DF 5/5 LLE Coordination: decreased gross motor (ataxic movements)    Cervical / Trunk Assessment Cervical / Trunk Assessment: Normal  Communication   Communication: Expressive difficulties;HOH (slurred)  Cognition Arousal/Alertness: Awake/alert Behavior During Therapy: WFL for tasks assessed/performed Overall Cognitive Status: No family/caregiver present to determine baseline cognitive functioning                                 General Comments: agrees he is not walking as well as PTA, but states he is safe to go home (decr safety awareness and awareness of deficits)        General Comments      Exercises     Assessment/Plan    PT Assessment Patient needs continued PT services  PT Problem List Decreased strength;Decreased balance;Decreased mobility;Decreased coordination;Decreased cognition;Decreased knowledge of use of DME;Decreased safety awareness       PT Treatment Interventions DME instruction;Gait  training;Stair training;Functional mobility training;Therapeutic activities;Therapeutic exercise;Balance training;Neuromuscular re-education;Cognitive remediation;Patient/family education    PT Goals (Current goals can be found in the Care Plan section)  Acute Rehab PT Goals Patient Stated Goal: go home soon PT Goal Formulation: With patient Time For Goal Achievement: 11/23/21 Potential to Achieve Goals: Good    Frequency Min 3X/week     Co-evaluation               AM-PAC PT "6 Clicks" Mobility  Outcome Measure Help needed turning from your back to your side while in a flat bed without using bedrails?: A Little Help needed moving from lying on your back to sitting on the side of a flat bed without using bedrails?: A Little Help needed moving to and from a bed to a chair (including a wheelchair)?: A Lot Help needed standing up from a chair using your arms (e.g., wheelchair or bedside chair)?: A Lot Help needed to walk in hospital room?: Total Help needed climbing 3-5 steps with a railing? : Total 6 Click Score: 12    End of Session Equipment Utilized During Treatment: Gait belt Activity Tolerance: Patient tolerated treatment well Patient left: in bed;with call bell/phone within reach;with bed alarm set Nurse Communication: Mobility status PT Visit Diagnosis: Unsteadiness on feet (R26.81);Muscle weakness (generalized) (M62.81);Difficulty in walking, not elsewhere classified (R26.2)    Time: [id number] PT Time Calculation (min) (ACUTE ONLY): 21 min   Charges:   PT Evaluation $PT Eval Moderate Complexity: 1 Mod  Arby Barrette, PT Acute Rehabilitation Services  Pager [phone] Office [phone]   Rexanne Mano 11/09/2021, 9:49 AM

## 2021-11-09 NOTE — TOC Initial Note (Signed)
Transition of Care [facility]) - Initial/Assessment Note    Patient Details  Name: [name] MRN: [id number] Date of Birth: 20-Dec-1955  [facility] CM/SW Contact:    Ina Homes, [place] Phone Number: 11/09/2021, 12:33 PM  Clinical Narrative:                  SW met with pt at bedside. Pt confirmed lives with sister and roommate in mobile home with ramped entrance. Pt confirmed has r/w, SPC, s/c and lift chair. Pt has hx with [facility]. SW discussed PT/OT recs for SNF. Pt adamantly refused, SW inquired if pt would be agreeable to [facility]. Pt refused again. Pt reports his sister doesn't work she's just "in and out" and she will assist at d/c. Pt reports sister or roommate (did not recall name) will likely transport. SW requested to call sister to confirm home plan, pt stated "what you need to call her for?" And declined to allow SW to speak with sister.    Expected Discharge Plan: Home/Self Care Barriers to Discharge: Continued Medical Work up   Patient Goals and CMS Choice Patient states their goals for this hospitalization and ongoing recovery are:: return home CMS Medicare.gov Compare Post Acute Care list provided to:: Patient Choice offered to / list presented to : Patient  Expected Discharge Plan and Services Expected Discharge Plan: Home/Self Care     Post Acute Care Choice: [place] Living arrangements for the past 2 months: Mobile Home                                      Prior Living Arrangements/Services Living arrangements for the past 2 months: Mobile Home Lives with:: Siblings, Roommate Patient language and need for interpreter reviewed:: Yes Do you feel safe going back to the place where you live?: Yes      Need for Family Participation in Patient Care: No (Comment)     Criminal Activity/Legal Involvement Pertinent to Current Situation/Hospitalization: No - Comment as needed  Activities of Daily Living Home Assistive  Devices/Equipment: None ADL Screening (condition at time of admission) Patient's cognitive ability adequate to safely complete daily activities?: Yes Is the patient deaf or have difficulty hearing?: No Does the patient have difficulty seeing, even when wearing glasses/contacts?: No Does the patient have difficulty concentrating, remembering, or making decisions?: Yes Patient able to express need for assistance with ADLs?: Yes Does the patient have difficulty dressing or bathing?: No Independently performs ADLs?: No Communication: Needs assistance Is this a change from baseline?: Change from baseline, expected to last <3 days Dressing (OT): Needs assistance Is this a change from baseline?: Change from baseline, expected to last <3days Grooming: Needs assistance Is this a change from baseline?: Change from baseline, expected to last <3 days Feeding: Needs assistance Is this a change from baseline?: Change from baseline, expected to last <3 days Bathing: Needs assistance Is this a change from baseline?: Change from baseline, expected to last <3 days Toileting: Needs assistance Is this a change from baseline?: Change from baseline, expected to last <3 days In/Out Bed: Needs assistance Is this a change from baseline?: Change from baseline, expected to last <3 days Walks in Home: Needs assistance Is this a change from baseline?: Change from baseline, expected to last <3 days Does the patient have difficulty walking or climbing stairs?: Yes Weakness of Legs: Both Weakness of Arms/Hands: Both  Permission Sought/Granted   Permission granted to share information with : No              Emotional Assessment Appearance:: Appears older than stated age Attitude/Demeanor/Rapport: Guarded Affect (typically observed): Blunt Orientation: : Oriented to Self, Oriented to Place, Oriented to  Time, Oriented to Situation      Admission diagnosis:  CVA (cerebral vascular accident) [facility])  [I63.9] NSTEMI (non-ST elevated myocardial infarction) ([name]) [I21.4] Acute ischemic stroke [facility]) [I63.9] Patient Active Problem List   Diagnosis Date Noted   Acute ischemic stroke ([place]) 10/25/2021   NSTEMI (non-ST elevated myocardial infarction) ([place]) 10/15/2021    Class: Acute   Right sided weakness 10/15/2021   Ataxia 12/03/2019   Macrocytic anemia 12/03/2019   Mild tetrahydrocannabinol (THC) abuse 08/09/2019   DDD (degenerative disc disease), cervical 08/24/2017   Arthritis 07/29/2017   Memory loss or impairment 07/08/2016   Seizure disorder ([place]) 11/04/2012   RLS (restless legs syndrome) 11/04/2012   CAD (coronary artery disease) 11/04/2012   Hyperlipidemia 06/05/2008   Generalized anxiety disorder 06/05/2008   History of alcohol abuse 06/05/2008   Cigarette smoker 06/05/2008   Depression, recurrent ([place]) 06/05/2008   OBSTRUCTIVE SLEEP APNEA 06/05/2008   Essential hypertension 06/05/2008   History of cerebral aneurysm 06/05/2008   COPD (chronic obstructive pulmonary disease) ([place]) 06/05/2008   GERD 06/05/2008   PCP:  Dettinger, Fransisca Kaufmann, MD Pharmacy:   [facility], [address] [place] [address] Phone: [phone] Fax: [phone]     Social Determinants of Health (SDOH) Interventions    Readmission Risk Interventions    12/04/2019    4:53 PM  Readmission Risk Prevention Plan  Transportation Screening Complete  Medication Review (RN Care Manager) Complete  PCP or Specialist appointment within 3-5 days of discharge Not Complete  PCP/Specialist Appt Not Complete comments Patient discharging to SNF for rehab  [place] or Home Care Consult Complete  SW Recovery Care/Counseling Consult Complete  Palliative Care Screening Not Applicable  Skilled Nursing Facility Complete

## 2021-11-09 NOTE — Significant Event (Signed)
Rapid Response Event Note   Reason for Call :  Called originally at 2019 for desaturation(80s) and tachypcardia(140s) s/p aspiration event. RRT to room at 2024 to find pt SpO2-97% on RA, HR-110. Pt alert and oriented, in no distress(aside from coughing). Recommended notifying MD for NPO order and PCXR.   Called back at 2039 for another episode of desaturation-80s, HR-160s. Pt was placed on NRB prior to my arrival.   Initial Focused Assessment:  Pt lying in bed with eyes open, in respiratory distress. He is alert and oriented, denies chest pain but is SOB. Breathing is congested. Pt is able to cough but it is non-productive.  Lungs rhonchus t/o. Skin warm and diaphoretic.   HR-150, BP-144/88, RR-36, SpO2-94% on NRB.   Pt NTS suctioned. Thick tan secretions and small tan chunks suctioned out with no change in respiratory distress. Pt's SpO2 then begin to drop to 80s on NRB and mental status declined.      Interventions:  1st episode: NPO, PCXR 2nd episode: NTS/deep oral suctioning Lasix 20mg   DNR>FC  PCCM consulted: Tx to ICU for intubation.  Plan of Care:  Tx to 2M11.   Event Summary:   MD Notified: Dr. 12-24-1986 notified and came to bedside, PCCM consulted, Dr. Toniann Fail to bedside.  Call Time:1839 Arrival [facility] All End Time:2210  [id number], RN

## 2021-11-09 NOTE — Consult Note (Signed)
Cardiology Consultation:   Patient ID: [name] MRN: [id number]; DOB: 1956-03-11  Admit date: 10/17/2021 Date of Consult: 11/09/2021  PCP:  Dettinger, Fransisca Kaufmann, MD   [facility] Providers Cardiologist:  New   Patient Profile:   [name] is a 66 y.o. 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  Carotid Doppler 11/2019: Bilateral ICA stenosis greater than 70% Limited echo August 2021 with LV function of 55 to 60%.  No regional wall motion abnormality.  History of Present Illness:   Mr. Leavins went to [facility] for slurred speech and progressively worsening right-sided weakness. Has chronic dysarthria which has worsened from baseline.  Hard to understand patient's speech.  Unable to provide proper history.  He denies chest pain, shortness of breath, palpitation, syncope or melena.  No TNK given due to window.  Transfer to [facility] for evaluation.  Started on heparin for elevated troponin.  CT of head without acute finding.  Showing chronic sinusitis. MRI of brain without acute finding. Chest x-ray without acute finding Pending CT angio of head and neck Pending echo   BNP 620 Hs-troponin 530>>561>>557 Scr normal HGb 11.8>>10.9 UDS clear    Past Medical History:  Diagnosis Date   Anxiety    Carotid artery disease (HCC)    Cerebral aneurysm    Closed head injury    COPD (chronic obstructive pulmonary disease) (HCC)    Depression    GERD (gastroesophageal reflux disease)    Hyperlipidemia    Hypertension    Seizures ([place])    Sleep apnea     History reviewed. No pertinent surgical history.    Inpatient Medications: Scheduled Meds:  aspirin  324 mg Oral Once   aspirin  325 mg Oral Daily   busPIRone  10 mg Oral TID    clopidogrel  75 mg Oral Daily   melatonin  9 mg Oral QHS   mirabegron ER  25 mg Oral Daily   pantoprazole  40 mg Oral Daily   PARoxetine  20 mg Oral Daily   pramipexole  1 mg Oral QHS   risperiDONE  2 mg Oral QHS   simvastatin  40 mg Oral QHS   sodium chloride flush  3 mL Intravenous Q12H   Continuous Infusions:  sodium chloride 75 mL/hr at 11/09/21 0623   heparin 850 Units/hr (11/09/21 0828)   PRN Meds: acetaminophen **OR** acetaminophen, bisacodyl, hydrALAZINE, HYDROmorphone (DILAUDID) injection, ipratropium, levalbuterol, nitroGLYCERIN, ondansetron **OR** ondansetron (ZOFRAN) IV, oxyCODONE, senna-docusate, traZODone  Allergies:    Allergies  Allergen Reactions   Codeine Rash    Social History:   Social History   Socioeconomic History   Marital status: Single    Spouse name: Not on file   Number of children: 1   Years of education: Not on file   Highest education level: Not on file  Occupational History   Occupation: disabled  Tobacco Use   Smoking status: Every Day    Packs/day: 1.00    Years: 50.00    Pack years: 50.00    Types: Cigarettes   Smokeless tobacco: Never  Vaping Use   Vaping Use: Never used  Substance and Sexual Activity   Alcohol use: No   Drug use: Yes    Types: Marijuana   Sexual activity: Not Currently  Other  Topics Concern   Not on file  Social History Narrative   He lives with his sister. He has one son that he doesn't get along with very well.   Patient had a stroke that left him with speech difficulties and high fall risk   Social Determinants of Health   Financial Resource Strain: Low Risk    Difficulty of Paying Living Expenses: Not hard at all  Food Insecurity: No Food Insecurity   Worried About Charity fundraiser in the Last Year: Never true   Ran Out of Food in the Last Year: Never true  Transportation Needs: No Transportation Needs   Lack of Transportation (Medical): No   Lack of Transportation (Non-Medical): No  Physical  Activity: Inactive   Days of Exercise per Week: 0 days   Minutes of Exercise per Session: 0 min  Stress: No Stress Concern Present   Feeling of Stress : Not at all  Social Connections: Socially Isolated   Frequency of Communication with Friends and Family: Never   Frequency of Social Gatherings with Friends and Family: Twice a week   Attends Religious Services: Never   Marine scientist or Organizations: No   Attends Music therapist: Never   Marital Status: Never married  Human resources officer Violence: Not At Risk   Fear of Current or Ex-Partner: No   Emotionally Abused: No   Physically Abused: No   Sexually Abused: No    Family History:    Family History  Problem Relation Age of Onset   Obesity Sister      ROS:  Please see the history of present illness.  All other ROS reviewed and negative.     Physical Exam/Data:   Vitals:   11/04/2021 1856 10/20/2021 2000 11/09/21 0422 11/09/21 0800  BP: 121/88 (!) 125/50 (!) 128/55 (!) 119/53  Pulse: 90 87 90 80  Resp: (!) 22 (!) 21 20 18   Temp: 98.1 F (36.7 C) 97.9 F (36.6 C) 98 F (36.7 C) (!) 97.5 F (36.4 C)  TempSrc: Oral Oral Oral Oral  SpO2: 96% 98% 91% 92%  Weight: 57.3 kg     Height: 5\' 7"  (1.702 m)       Intake/Output Summary (Last 24 hours) at 11/09/2021 0928 Last data filed at 11/09/2021 [id number] Gross per 24 hour  Intake 409.83 ml  Output --  Net 409.83 ml      10/17/2021    6:56 PM 10/14/2021   12:45 PM 11/06/2021    9:07 AM  Last 3 Weights  Weight (lbs) 126 lb 5.2 oz 125 lb 125 lb  Weight (kg) 57.3 kg 56.7 kg 56.7 kg     Body mass index is 19.79 kg/m.  General:  ill frail elderly male in no acute distress HEENT: normal Neck: no JVD Vascular: No carotid bruits; Distal pulses 2+ bilaterally Cardiac:  normal S1, S2; RRR; no murmur  Lungs:  clear to auscultation bilaterally, no wheezing, rhonchi or rales  Abd: soft, nontender, no hepatomegaly  Ext: no edema Musculoskeletal:  No deformities,  BUE and BLE strength normal and equal Skin: warm and dry  Neuro:R sided weakness, difficult to understand speech  Psych:  Normal affect   EKG:  The EKG was personally reviewed and demonstrates: Sinus rhythm, LVH with repolarization abnormality, ST changes in inferior leads which seem similar to prior Telemetry:  Telemetry was personally reviewed and demonstrates:  Sinus rhythm  Relevant CV Studies:  Limited Echo 01/2020 IMPRESSIONS  1. Left ventricular ejection fraction, by estimation, is 55 to 60%. The  left ventricle has normal function. Left ventricular diastolic parameters  were normal.   2. Right ventricular systolic function is normal. The right ventricular  size is normal. Tricuspid regurgitation signal is inadequate for assessing  PA pressure.   3. The mitral valve is normal in structure. Trivial mitral valve  regurgitation.   4. The aortic valve was not well visualized. Aortic valve regurgitation  is not visualized. No aortic stenosis is present.   5. The inferior vena cava is normal in size with greater than 50%  respiratory variability, suggesting right atrial pressure of 3 mmHg.   Laboratory Data:  High Sensitivity Troponin:   Recent Labs  Lab 11/09/2021 1245 11/03/2021 1445 11/05/2021 1554  TROPONINIHS 530* 561* 557*     Chemistry Recent Labs  Lab 10/19/2021 1149 10/30/2021 1157 11/09/21 0624  NA 136 136 136  K 4.2 4.2 3.7  CL 102 98 103  CO2 29  --  23  GLUCOSE 114* 112* 88  BUN 21 21 17   CREATININE 0.70 0.60* 0.80  CALCIUM 9.2  --  8.5*  MG  --   --  2.0  GFRNONAA >60  --  >60  ANIONGAP 5  --  10    Recent Labs  Lab 11/07/2021 1149 11/09/21 0624  PROT 7.2 5.7*  ALBUMIN 3.9 2.9*  AST 32 29  ALT 22 19  ALKPHOS 69 56  BILITOT 1.0 1.4*   Lipids  Recent Labs  Lab 10/13/2021 1148  CHOL 127  TRIG 34  HDL 64  LDLCALC 56  CHOLHDL 2.0    Hematology Recent Labs  Lab 11/09/2021 1149 11/07/2021 1157 10/15/2021 1445 11/09/21 0624  WBC 14.5*  --  14.3*  11.8*  RBC 3.77*  --  3.54* 3.32*  HGB 12.6* 13.9 11.8* 10.9*  HCT 38.3* 41.0 36.1* 33.7*  MCV 101.6*  --  102.0* 101.5*  MCH 33.4  --  33.3 32.8  MCHC 32.9  --  32.7 32.3  RDW 13.5  --  13.5 13.2  PLT 209  --  199 189   Thyroid No results for input(s): TSH, FREET4 in the last 168 hours.  BNP Recent Labs  Lab 11/09/21 0624  BNP 620.9*    DDimer No results for input(s): DDIMER in the last 168 hours.   Radiology/Studies:  MR BRAIN WO CONTRAST  Result Date: 11/11/2021 CLINICAL DATA:  Slurred speech bilateral weakness EXAM: MRI HEAD WITHOUT CONTRAST TECHNIQUE: Multiplanar, multiecho pulse sequences of the brain and surrounding structures were obtained without intravenous contrast. COMPARISON:  02/03/2020, correlation is also made with CT head 11/07/2021 FINDINGS: Evaluation is somewhat limited by motion artifact. Brain: No restricted diffusion to suggest acute or subacute infarct. No acute hemorrhage, mass, mass effect, or midline shift. No hydrocephalus or extra-axial collection. Cerebral volume loss is advanced for age. Confluent T2 hyperintense signal in the periventricular white matter and pons, likely the sequela of moderate to severe chronic small vessel ischemic disease. Redemonstrated lacunar infarcts in the thalami and basal ganglia. Redemonstrated hemosiderin deposition in the left midbrain, likely sequela of prior hypertensive microhemorrhage. Vascular: Normal arterial flow voids. Skull and upper cervical spine: Normal marrow signal. Sinuses/Orbits: Chronic bilateral maxillary sinusitis. Mucosal thickening in the ethmoid air cells. No acute finding in the orbits. Other: Trace fluid in left mastoid tip. IMPRESSION: No acute intracranial process. No evidence of acute or subacute infarct. Electronically Signed   By: Francetta Found.D.  On: 10/18/2021 21:21   DG Chest Port 1 View  Result Date: 11/09/2021 CLINICAL DATA:  Shortness of breath. EXAM: PORTABLE CHEST 1 VIEW COMPARISON:   12/03/2019 FINDINGS: 0558 hours. Rotated film. The lungs are clear without focal pneumonia, edema, pneumothorax or pleural effusion. Interstitial markings are diffusely coarsened with chronic features. Cardiopericardial silhouette is at upper limits of normal for size. Bones are diffusely demineralized. Telemetry leads overlie the chest. IMPRESSION: Chronic interstitial coarsening. No acute cardiopulmonary findings. Electronically Signed   By: Misty Stanley M.D.   On: 11/09/2021 06:18   CT HEAD CODE STROKE WO CONTRAST  Result Date: 10/18/2021 CLINICAL DATA:  Code stroke. Stroke suspected. Right-sided weakness for 23 hours EXAM: CT HEAD WITHOUT CONTRAST TECHNIQUE: Contiguous axial images were obtained from the base of the skull through the vertex without intravenous contrast. RADIATION DOSE REDUCTION: This exam was performed according to the departmental dose-optimization program which includes automated exposure control, adjustment of the mA and/or kV according to patient size and/or use of iterative reconstruction technique. COMPARISON:  09/14/2021 FINDINGS: Brain: No evidence of acute infarction, hemorrhage, hydrocephalus, extra-axial collection or mass lesion/mass effect. Multiple chronic lacunar infarcts at the bilateral deep gray nuclei, especially the bilateral thalamus. 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 Premature chronic small vessel ischemia. Confluence clinic gliosis in the cerebral white matter. Vascular: No hyperdense vessel or unexpected calcification. Skull: Normal. Negative for fracture or focal lesion. Prior burr holes on the left. Sinuses/Orbits: Chronic bilateral maxillary sinusitis with left more than right opacification and sclerotic wall thickening. ASPECTS [facility] Stroke Program Early CT Score) - Ganglionic level infarction (caudate, lentiform nuclei, internal capsule, insula, M1-M3 cortex): 7 - Supraganglionic infarction (M4-M6 cortex): 3 Total score (0-10  with 10 being normal): 10 IMPRESSION: 1. No acute finding. 2. Advanced chronic small vessel ischemia. Generalized cerebral atrophy. 3. Chronic sinusitis. Electronically Signed   By: Jorje Guild M.D.   On: 11/03/2021 12:04     Assessment and Plan:   Elevated troponin  - Hs-troponin 530>>561>>557. No chest pain or dyspnea. This is in setting of stroke like symptoms. CT of head and MRI of brain without acute finding.  -Continue IV heparin.  Further recommendation regarding ischemic evaluation based on echocardiogram finding. -EKG with repolarization abnormality.  He has somewhat worsening ST upsloping in inferior leads -He is on aspirin and Plavix for stroke -Continue statin  2.  Carotid artery disease -Bilateral ICA stenosis by Doppler in 2021. -Pending CT angio of head and neck  3.  Strokelike symptoms -Followed by primary/neurology team  Dr. Harrell Gave to see.    Risk Assessment/Risk Scores:   TIMI Risk Score for Unstable Angina or Non-ST Elevation MI:   The patient's TIMI risk score is 3, which indicates a 13% risk of all cause mortality, new or recurrent myocardial infarction or need for urgent revascularization in the next 14 days.{   For questions or updates, please contact [place] Please consult [email or web address] for contact info under    Jarrett Soho, PA  11/09/2021 9:28 AM

## 2021-11-10 ENCOUNTER — Inpatient Hospital Stay (HOSPITAL_COMMUNITY): Payer: Medicare Other

## 2021-11-10 DIAGNOSIS — I639 Cerebral infarction, unspecified: Secondary | ICD-10-CM | POA: Diagnosis not present

## 2021-11-10 DIAGNOSIS — R4182 Altered mental status, unspecified: Secondary | ICD-10-CM | POA: Diagnosis not present

## 2021-11-10 DIAGNOSIS — I214 Non-ST elevation (NSTEMI) myocardial infarction: Secondary | ICD-10-CM | POA: Diagnosis not present

## 2021-11-10 DIAGNOSIS — I952 Hypotension due to drugs: Secondary | ICD-10-CM

## 2021-11-10 DIAGNOSIS — I6523 Occlusion and stenosis of bilateral carotid arteries: Secondary | ICD-10-CM | POA: Diagnosis not present

## 2021-11-10 DIAGNOSIS — G40909 Epilepsy, unspecified, not intractable, without status epilepticus: Secondary | ICD-10-CM | POA: Diagnosis not present

## 2021-11-10 DIAGNOSIS — I330 Acute and subacute infective endocarditis: Secondary | ICD-10-CM

## 2021-11-10 DIAGNOSIS — E782 Mixed hyperlipidemia: Secondary | ICD-10-CM | POA: Diagnosis not present

## 2021-11-10 LAB — CBC WITH DIFFERENTIAL/PLATELET
Abs Immature Granulocytes: 0.07 10*3/uL (ref 0.00–0.07)
Basophils Absolute: 0 10*3/uL (ref 0.0–0.1)
Basophils Relative: 0 %
Eosinophils Absolute: 0 10*3/uL (ref 0.0–0.5)
Eosinophils Relative: 0 %
HCT: 32.2 % — ABNORMAL LOW (ref 39.0–52.0)
Hemoglobin: 10.9 g/dL — ABNORMAL LOW (ref 13.0–17.0)
Immature Granulocytes: 0 %
Lymphocytes Relative: 3 %
Lymphs Abs: 0.6 10*3/uL — ABNORMAL LOW (ref 0.7–4.0)
MCH: 34.1 pg — ABNORMAL HIGH (ref 26.0–34.0)
MCHC: 33.9 g/dL (ref 30.0–36.0)
MCV: 100.6 fL — ABNORMAL HIGH (ref 80.0–100.0)
Monocytes Absolute: 2 10*3/uL — ABNORMAL HIGH (ref 0.1–1.0)
Monocytes Relative: 11 %
Neutro Abs: 14.9 10*3/uL — ABNORMAL HIGH (ref 1.7–7.7)
Neutrophils Relative %: 86 %
Platelets: 204 10*3/uL (ref 150–400)
RBC: 3.2 MIL/uL — ABNORMAL LOW (ref 4.22–5.81)
RDW: 12.9 % (ref 11.5–15.5)
WBC: 17.5 10*3/uL — ABNORMAL HIGH (ref 4.0–10.5)
nRBC: 0 % (ref 0.0–0.2)

## 2021-11-10 LAB — POCT I-STAT 7, (LYTES, BLD GAS, ICA,H+H)
Acid-Base Excess: 3 mmol/L — ABNORMAL HIGH (ref 0.0–2.0)
Bicarbonate: 28.8 mmol/L — ABNORMAL HIGH (ref 20.0–28.0)
Calcium, Ion: 1.17 mmol/L (ref 1.15–1.40)
HCT: 32 % — ABNORMAL LOW (ref 39.0–52.0)
Hemoglobin: 10.9 g/dL — ABNORMAL LOW (ref 13.0–17.0)
O2 Saturation: 100 %
Patient temperature: 98.1
Potassium: 4.3 mmol/L (ref 3.5–5.1)
Sodium: 136 mmol/L (ref 135–145)
TCO2: 30 mmol/L (ref 22–32)
pCO2 arterial: 50.6 mmHg — ABNORMAL HIGH (ref 32–48)
pH, Arterial: 7.362 (ref 7.35–7.45)
pO2, Arterial: 378 mmHg — ABNORMAL HIGH (ref 83–108)

## 2021-11-10 LAB — COMPREHENSIVE METABOLIC PANEL
ALT: 22 U/L (ref 0–44)
AST: 44 U/L — ABNORMAL HIGH (ref 15–41)
Albumin: 2.5 g/dL — ABNORMAL LOW (ref 3.5–5.0)
Alkaline Phosphatase: 62 U/L (ref 38–126)
Anion gap: 7 (ref 5–15)
BUN: 18 mg/dL (ref 8–23)
CO2: 23 mmol/L (ref 22–32)
Calcium: 7.8 mg/dL — ABNORMAL LOW (ref 8.9–10.3)
Chloride: 106 mmol/L (ref 98–111)
Creatinine, Ser: 0.97 mg/dL (ref 0.61–1.24)
GFR, Estimated: >60 mL/min (ref >60)
Glucose, Bld: 189 mg/dL — ABNORMAL HIGH (ref 70–99)
Potassium: 4 mmol/L (ref 3.5–5.1)
Sodium: 136 mmol/L (ref 135–145)
Total Bilirubin: 1 mg/dL (ref 0.3–1.2)
Total Protein: 5.4 g/dL — ABNORMAL LOW (ref 6.5–8.1)

## 2021-11-10 LAB — GLUCOSE, CAPILLARY
Glucose-Capillary: 144 mg/dL — ABNORMAL HIGH (ref 70–99)
Glucose-Capillary: 177 mg/dL — ABNORMAL HIGH (ref 70–99)
Glucose-Capillary: 127 mg/dL — ABNORMAL HIGH (ref 70–99)
Glucose-Capillary: 104 mg/dL — ABNORMAL HIGH (ref 70–99)
Glucose-Capillary: 118 mg/dL — ABNORMAL HIGH (ref 70–99)
Glucose-Capillary: 115 mg/dL — ABNORMAL HIGH (ref 70–99)

## 2021-11-10 LAB — BASIC METABOLIC PANEL
Anion gap: 6 (ref 5–15)
BUN: 17 mg/dL (ref 8–23)
CO2: 25 mmol/L (ref 22–32)
Calcium: 7.9 mg/dL — ABNORMAL LOW (ref 8.9–10.3)
Chloride: 106 mmol/L (ref 98–111)
Creatinine, Ser: 0.69 mg/dL (ref 0.61–1.24)
GFR, Estimated: >60 mL/min (ref >60)
Glucose, Bld: 127 mg/dL — ABNORMAL HIGH (ref 70–99)
Potassium: 3.8 mmol/L (ref 3.5–5.1)
Sodium: 137 mmol/L (ref 135–145)

## 2021-11-10 LAB — HEPARIN LEVEL (UNFRACTIONATED)
Heparin Unfractionated: 0.17 IU/mL — ABNORMAL LOW (ref 0.30–0.70)
Heparin Unfractionated: 0.27 IU/mL — ABNORMAL LOW (ref 0.30–0.70)
Heparin Unfractionated: 0.24 IU/mL — ABNORMAL LOW (ref 0.30–0.70)

## 2021-11-10 LAB — BRAIN NATRIURETIC PEPTIDE: B Natriuretic Peptide: 932.8 pg/mL — ABNORMAL HIGH (ref 0.0–100.0)

## 2021-11-10 LAB — LACTIC ACID, PLASMA: Lactic Acid, Venous: 1.1 mmol/L (ref 0.5–1.9)

## 2021-11-10 LAB — MAGNESIUM
Magnesium: 2.1 mg/dL (ref 1.7–2.4)
Magnesium: 1.9 mg/dL (ref 1.7–2.4)

## 2021-11-10 MED ORDER — SODIUM CHLORIDE 0.9 % IV BOLUS
1000.0000 mL | Freq: Once | INTRAVENOUS | Status: AC
  Administered 2021-11-10: 1000 mL via INTRAVENOUS

## 2021-11-10 MED ORDER — PAROXETINE HCL 20 MG PO TABS
20.0000 mg | ORAL_TABLET | Freq: Every day | ORAL | Status: DC
  Administered 2021-11-10 – 2021-11-14 (×5): 20 mg
  Filled 2021-11-10 (×5): qty 1

## 2021-11-10 MED ORDER — FENTANYL BOLUS VIA INFUSION
25.0000 ug | INTRAVENOUS | Status: DC | PRN
  Administered 2021-11-10: 100 ug via INTRAVENOUS
  Administered 2021-11-10: 50 ug via INTRAVENOUS
  Administered 2021-11-11 (×2): 100 ug via INTRAVENOUS

## 2021-11-10 MED ORDER — NOREPINEPHRINE 4 MG/250ML-% IV SOLN
0.0000 ug/min | INTRAVENOUS | Status: DC
  Administered 2021-11-10: 8 ug/min via INTRAVENOUS
  Administered 2021-11-10: 13 ug/min via INTRAVENOUS
  Administered 2021-11-10: 6 ug/min via INTRAVENOUS
  Administered 2021-11-11: 5 ug/min via INTRAVENOUS
  Administered 2021-11-11: 6 ug/min via INTRAVENOUS
  Filled 2021-11-10 (×7): qty 250

## 2021-11-10 MED ORDER — DEXMEDETOMIDINE HCL IN NACL 400 MCG/100ML IV SOLN
0.0000 ug/kg/h | INTRAVENOUS | Status: AC
  Administered 2021-11-10 – 2021-11-11 (×2): 0.8 ug/kg/h via INTRAVENOUS
  Administered 2021-11-11: 0.6 ug/kg/h via INTRAVENOUS
  Administered 2021-11-12: 0.9 ug/kg/h via INTRAVENOUS
  Administered 2021-11-12: 1 ug/kg/h via INTRAVENOUS
  Administered 2021-11-12: 0.4 ug/kg/h via INTRAVENOUS
  Administered 2021-11-13 (×2): 1 ug/kg/h via INTRAVENOUS
  Administered 2021-11-13: 0.9 ug/kg/h via INTRAVENOUS
  Filled 2021-11-10 (×8): qty 100

## 2021-11-10 MED ORDER — RISPERIDONE 0.5 MG PO TABS
2.0000 mg | ORAL_TABLET | Freq: Every day | ORAL | Status: DC
Start: 2021-11-10 — End: 2021-11-10

## 2021-11-10 MED ORDER — CLOPIDOGREL BISULFATE 75 MG PO TABS
75.0000 mg | ORAL_TABLET | Freq: Every day | ORAL | Status: DC
  Administered 2021-11-10 – 2021-11-14 (×5): 75 mg
  Filled 2021-11-10 (×5): qty 1

## 2021-11-10 MED ORDER — ACETAMINOPHEN 650 MG RE SUPP: 650.0000 mg | Freq: Four times a day (QID) | RECTAL | Status: DC | PRN

## 2021-11-10 MED ORDER — BUSPIRONE HCL 10 MG PO TABS
10.0000 mg | ORAL_TABLET | Freq: Three times a day (TID) | ORAL | Status: DC
  Administered 2021-11-10: 10 mg
  Filled 2021-11-10 (×3): qty 1

## 2021-11-10 MED ORDER — ATORVASTATIN CALCIUM 40 MG PO TABS
80.0000 mg | ORAL_TABLET | Freq: Every day | ORAL | Status: DC
  Administered 2021-11-10 – 2021-11-13 (×4): 80 mg
  Filled 2021-11-10 (×4): qty 2

## 2021-11-10 MED ORDER — FENTANYL 2500MCG IN NS 250ML (10MCG/ML) PREMIX INFUSION
0.0000 ug/h | INTRAVENOUS | Status: DC
  Administered 2021-11-10 – 2021-11-11 (×2): 25 ug/h via INTRAVENOUS
  Administered 2021-11-11: 225 ug/h via INTRAVENOUS
  Administered 2021-11-12: 25 ug/h via INTRAVENOUS
  Filled 2021-11-10 (×3): qty 250

## 2021-11-10 MED ORDER — ASPIRIN 81 MG PO CHEW
81.0000 mg | CHEWABLE_TABLET | Freq: Every day | ORAL | Status: DC
  Administered 2021-11-11 – 2021-11-14 (×4): 81 mg
  Filled 2021-11-10 (×4): qty 1

## 2021-11-10 MED ORDER — ONDANSETRON HCL 4 MG/2ML IJ SOLN: 4.0000 mg | Freq: Four times a day (QID) | INTRAMUSCULAR | Status: DC | PRN

## 2021-11-10 MED ORDER — PANTOPRAZOLE 2 MG/ML SUSPENSION
40.0000 mg | Freq: Every day | ORAL | Status: DC
Start: 2021-11-10 — End: 2021-11-14
  Administered 2021-11-10 – 2021-11-14 (×5): 40 mg
  Filled 2021-11-10 (×5): qty 20

## 2021-11-10 MED ORDER — SODIUM CHLORIDE 0.9 % IV BOLUS
500.0000 mL | Freq: Once | INTRAVENOUS | Status: AC
  Administered 2021-11-10: 500 mL via INTRAVENOUS

## 2021-11-10 MED ORDER — PRAMIPEXOLE DIHYDROCHLORIDE 1 MG PO TABS
1.0000 mg | ORAL_TABLET | Freq: Every day | ORAL | Status: DC
Start: 2021-11-10 — End: 2021-11-14
  Administered 2021-11-10 – 2021-11-13 (×4): 1 mg
  Filled 2021-11-10 (×5): qty 1

## 2021-11-10 MED ORDER — TRAZODONE HCL 50 MG PO TABS: 25.0000 mg | ORAL_TABLET | Freq: Every evening | ORAL | Status: DC | PRN

## 2021-11-10 MED ORDER — ACETAMINOPHEN 325 MG PO TABS
650.0000 mg | ORAL_TABLET | Freq: Four times a day (QID) | ORAL | Status: DC | PRN
  Administered 2021-11-11 – 2021-11-12 (×2): 650 mg
  Filled 2021-11-10 (×3): qty 2

## 2021-11-10 MED ORDER — ONDANSETRON HCL 4 MG PO TABS: 4.0000 mg | ORAL_TABLET | Freq: Four times a day (QID) | ORAL | Status: DC | PRN

## 2021-11-10 MED ORDER — ASPIRIN 325 MG PO TABS
325.0000 mg | ORAL_TABLET | Freq: Every day | ORAL | Status: DC
  Administered 2021-11-10: 325 mg
  Filled 2021-11-10: qty 1

## 2021-11-10 MED ORDER — DEXMEDETOMIDINE HCL IN NACL 400 MCG/100ML IV SOLN
0.4000 ug/kg/h | INTRAVENOUS | Status: DC
  Administered 2021-11-10: 0.4 ug/kg/h via INTRAVENOUS
  Filled 2021-11-10: qty 100

## 2021-11-10 MED ORDER — SENNOSIDES-DOCUSATE SODIUM 8.6-50 MG PO TABS: 1.0000 | ORAL_TABLET | Freq: Every evening | ORAL | Status: DC | PRN

## 2021-11-10 MED ORDER — MELATONIN 3 MG PO TABS
9.0000 mg | ORAL_TABLET | Freq: Every day | ORAL | Status: DC
  Administered 2021-11-10 – 2021-11-13 (×4): 9 mg
  Filled 2021-11-10 (×4): qty 3

## 2021-11-10 NOTE — Progress Notes (Signed)
NAME:  [name], MRN:  [id number], DOB:  18-Mar-1956, LOS: 2 ADMISSION DATE:  11/01/2021, CONSULTATION DATE:  11/09/21 REFERRING MD:  TR[facility] CHIEF COMPLAINT:  Respiratory distress   History of Present Illness:  66 year old male active smoker with PMHx as noted below admitted for TIA secondary to bilateral ICA stenosis. Initially presened to [facility] with slurred speech and R>L sided weakness. Code stroke called. Also concerned for NSTEMI so transferred to [facility]. Per bedside RN and Neurology note today he has returned to baseline and he was able to follow commands with residual dysarthria.  Overnight he had choked on dinner. Bedside RN suctioned and some food removed. He was originally on room air and desatted requiring NRB and in distress. Previously able to follow commands and mainly dysarthric but now confused and in respiratory distress. CXR with pulmonary edema. Primary team contacted family and family reversed code status to full code. PCCM consulted for transfer to ICU  Pertinent  Medical History  HTN, HLD, CAD, COPD, bilateral carotid stenosis, hx seizures from TBI, OSA  Significant Hospital Events: Including procedures, antibiotic start and stop dates in addition to other pertinent events   5/27 - Presented to [facility]. Transferred to [facility] 5/28 - Returned to baseline during day except for dysarthria. Started on dysphagia diet  Interim History / Subjective:   Patient mains critically ill intubated on mechanical life support on vasopressors  Objective   Blood pressure 100/67, pulse (!) 39, temperature 98.2 F (36.8 C), temperature source Axillary, resp. rate (!) 22, height 5\' 7"  (1.702 m), weight 53.3 kg, SpO2 100 %. CVP:  [4 mmHg] 4 mmHg  Vent Mode: PRVC FiO2 (%):  [50 %-100 %] 50 % Set Rate:  [16 bmp] 16 bmp Vt Set:  [530 mL] 530 mL PEEP:  [5 cmH20-10 cmH20] 5 cmH20 Plateau Pressure:  [17 cmH20-23 cmH20] 17 cmH20   Intake/Output Summary (Last 24 hours) at 11/10/2021 0731 Last  data filed at 11/10/2021 [id number] Gross per 24 hour  Intake 2592.47 ml  Output 1000 ml  Net 1592.47 ml   Filed Weights   10/18/2021 1856 11/09/21 2250 11/10/21 0456  Weight: 57.3 kg 53.3 kg 53.3 kg    Physical Exam: General: Critically ill, confused, responds to stimulus HENT: NCAT, mucous membranes moist, left internal jugular CVC placed Eyes: Opens eyes to stimulus Respiratory: Bilateral ventilated breath sounds Cardiovascular: Tachycardic, regular rhythm S1-S2 GI: Soft nontender nondistended Extremities: No significant edema Neuro: Sedated on mechanical support arousable to stimulation  Resolved Hospital Problem list     Assessment & Plan:   Acute hypoxemic respiratory failure secondary to aspiration, thought to choke on chicken Pulmonary edema Acute encephalopathy secondary to above in setting of critical illness Plan: Remains on full vent support Low tidal volume ventilation Wean PEEP and FiO2 as tolerated to maintain sats above 88%. Chest x-ray as needed Continue Unasyn VAP prophylaxis Head of bed elevated  Nosebleed following NT suctioning on heparin Plan: Nose remain packed with gauze looks like bleeding has stopped. Continue to observe  Septic shock SIRS response from aspiration and choking on chicken Plan: Continue norepinephrine infusion Maintain mean arterial pressure greater than 65 mmHg  TIA Bilateral carotid stenosis HTN Appreciate vascular surgery input SBP goal 130-160 Follow-up carotid duplex  NSTEMI CAD HLD Plan: Appreciate cardiology input Continue dual antiplatelet therapy, heparin infusion Statin  OSA COPD On CPAP at home Remains intubated at this time  Hx TBI with associated seizure Plan: Supportive care  Anxiety Plan: SSRI  [place] Plan: We will continue discussions with family.   Best Practice (right click and "Reselect all SmartList Selections" daily)   Diet/type: NPO DVT prophylaxis: systemic heparin GI prophylaxis:  PPI Lines: N/A Foley:  N/A Code Status:  full code Family reversed DNR prior to transfer Last date of multidisciplinary goals of care discussion [5/28 with [facility]]   Labs   CBC: Recent Labs  Lab 10/24/2021 1149 11/07/2021 1157 10/22/2021 1445 11/09/21 0624 11/10/21 0013 11/10/21 0133  WBC 14.5*  --  14.3* 11.8*  --  17.5*  NEUTROABS 11.9*  --   --   --   --  14.9*  HGB 12.6* 13.9 11.8* 10.9* 10.9* 10.9*  HCT 38.3* 41.0 36.1* 33.7* 32.0* 32.2*  MCV 101.6*  --  102.0* 101.5*  --  100.6*  PLT 209  --  199 189  --  0000000    Basic Metabolic Panel: Recent Labs  Lab 10/25/2021 1149 10/25/2021 1157 11/09/21 0624 11/10/21 0013 11/10/21 0133  NA 136 136 136 136 136  K 4.2 4.2 3.7 4.3 4.0  CL 102 98 103  --  106  CO2 29  --  23  --  23  GLUCOSE 114* 112* 88  --  189*  BUN 21 21 17   --  18  CREATININE 0.70 0.60* 0.80  --  0.97  CALCIUM 9.2  --  8.5*  --  7.8*  MG  --   --  2.0  --  2.1   GFR: Estimated Creatinine Clearance: 55.7 mL/min (by C-G formula based on SCr of 0.97 mg/dL). Recent Labs  Lab 10/16/2021 1149 10/27/2021 1445 11/09/21 0624 11/10/21 0133  WBC 14.5* 14.3* 11.8* 17.5*  LATICACIDVEN  --   --   --  1.1    Liver Function Tests: Recent Labs  Lab 10/21/2021 1149 11/09/21 0624 11/10/21 0133  AST 32 29 44*  ALT 22 19 22   ALKPHOS 69 56 62  BILITOT 1.0 1.4* 1.0  PROT 7.2 5.7* 5.4*  ALBUMIN 3.9 2.9* 2.5*   No results for input(s): LIPASE, AMYLASE in the last 168 hours. No results for input(s): AMMONIA in the last 168 hours.  ABG    Component Value Date/Time   PHART 7.362 11/10/2021 0013   PCO2ART 50.6 (H) 11/10/2021 0013   PO2ART 378 (H) 11/10/2021 0013   HCO3 28.8 (H) 11/10/2021 0013   TCO2 30 11/10/2021 0013   ACIDBASEDEF 0.3 01/04/2008 0320   O2SAT 100 11/10/2021 0013     Coagulation Profile: Recent Labs  Lab 11/07/2021 1149  INR 1.1    Cardiac Enzymes: No results for input(s): CKTOTAL, CKMB, CKMBINDEX, TROPONINI in the last 168 hours.  HbA1C: Hgb  A1c MFr Bld  Date/Time Value Ref Range Status  11/09/2021 06:24 AM 6.2 (H) 4.8 - 5.6 % Final    Comment:    (NOTE) Pre diabetes:          5.7%-6.4%  Diabetes:              >6.4%  Glycemic control for   <7.0% adults with diabetes   02/03/2020 07:34 AM 6.1 (H) 4.8 - 5.6 % Final    Comment:    (NOTE) Pre diabetes:          5.7%-6.4%  Diabetes:              >6.4%  Glycemic control for   <7.0% adults with diabetes     CBG: Recent Labs  Lab 11/09/21 2329 11/10/21 0324 11/10/21 [id number]  GLUCAP 205* 144* 177*    Review of Systems:   Unable to obtain due to critical illness  Past Medical History:  He,  has a past medical history of Anxiety, Carotid artery disease ([place]), Cerebral aneurysm, Closed head injury, COPD (chronic obstructive pulmonary disease) ([place]), Depression, GERD (gastroesophageal reflux disease), Hyperlipidemia, Hypertension, Seizures ([place]), and Sleep apnea.   Surgical History:  History reviewed. No pertinent surgical history.   Social History:   reports that he has been smoking cigarettes. He has a 50.00 pack-year smoking history. He has never used smokeless tobacco. He reports current drug use. Drug: Marijuana. He reports that he does not drink alcohol.   Family History:  His family history includes Obesity in his sister.   Allergies Allergies  Allergen Reactions   Codeine Rash     Home Medications  Prior to Admission medications   Medication Sig Start Date End Date Taking? Authorizing Provider  albuterol (VENTOLIN HFA) 108 (90 Base) MCG/ACT inhaler 2 PUFFS EVERY 6 HOURS AS NEEDED FOR WHEEZING OR SHORTNESS OF BREATH Patient taking differently: Inhale 2 puffs into the lungs every 6 (six) hours as needed for wheezing or shortness of breath. 11/20/19  Yes Dettinger, Fransisca Kaufmann, MD  aspirin 325 MG tablet Take 325 mg by mouth daily.   Yes [provider]  busPIRone (BUSPAR) 10 MG tablet TAKE ONE TABLET THREE TIMES DAILY Patient taking differently: Take  10 mg by mouth 3 (three) times daily. 08/26/21  Yes Dettinger, Fransisca Kaufmann, MD  ipratropium-albuterol (DUONEB) 0.5-2.5 (3) MG/3ML SOLN INHALE 3 MLS BY NEBULIZER 4 TIMES A DAY Patient taking differently: Take 3 mLs by nebulization every 6 (six) hours as needed (wheezing/shortness of breath). 09/22/21  Yes Dettinger, Fransisca Kaufmann, MD  mupirocin ointment (BACTROBAN) 2 % PLACE 1 APPLICATION INTO THE NOSE 2 TIMES A DAY Patient taking differently: Apply 1 application. topically 2 (two) times daily. PLACE 1 APPLICATION INTO THE NOSE 2 TIMES A DAY 03/13/20  Yes Dettinger, Fransisca Kaufmann, MD  MYRBETRIQ 25 MG TB24 tablet TAKE ONE TABLET ONCE DAILY Patient taking differently: Take 25 mg by mouth daily. 10/27/21  Yes Dettinger, Fransisca Kaufmann, MD  niacin (NIASPAN) 1000 MG CR tablet TAKE 1 TABLET BY MOUTH AT BEDTIME. Patient taking differently: Take 1,000 mg by mouth at bedtime. 08/26/21  Yes Dettinger, Fransisca Kaufmann, MD  Nystatin (GERHARDT'S BUTT CREAM) CREA Apply 1 application topically daily. Equal parts nystatin and hydrocortisone and zinc oxide 03/15/20  Yes Dettinger, Fransisca Kaufmann, MD  omeprazole (PRILOSEC) 40 MG capsule TAKE (1) CAPSULE DAILY Patient taking differently: Take 40 mg by mouth daily. 08/26/21  Yes Dettinger, Fransisca Kaufmann, MD  PARoxetine (PAXIL) 20 MG tablet TAKE ONE TABLET ONCE DAILY Patient taking differently: Take 20 mg by mouth daily. 08/26/21  Yes Dettinger, Fransisca Kaufmann, MD  Potassium 99 MG TABS Take 1 tablet by mouth daily.   Yes [provider]  pramipexole (MIRAPEX) 0.5 MG tablet Take 2 tablets (1 mg total) by mouth at bedtime. 09/19/20  Yes Dettinger, Fransisca Kaufmann, MD  risperiDONE (RISPERDAL) 2 MG tablet Take 1 tablet (2 mg total) by mouth at bedtime. 09/19/20  Yes Dettinger, Fransisca Kaufmann, MD  simvastatin (ZOCOR) 20 MG tablet TAKE ONE TABLET AT BEDTIME Patient taking differently: Take 20 mg by mouth daily at 6 PM. 08/26/21  Yes Dettinger, Fransisca Kaufmann, MD  thiamine 100 MG tablet Take 1 tablet (100 mg total) by mouth daily. 12/02/19   Yes Roxan Hockey, MD     This patient is critically  ill with multiple organ system failure; which, requires frequent high complexity decision making, assessment, support, evaluation, and titration of therapies. This was completed through the application of advanced monitoring technologies and extensive interpretation of multiple databases. During this encounter critical care time was devoted to patient care services described in this note for 36 minutes.  Garner Nash, DO [place] 11/10/2021 7:31 AM

## 2021-11-10 NOTE — Procedures (Signed)
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  EEG to evaluate for seizure.  Level of alertness: Awake, asleep  AEDs during EEG study: None  Technical aspects: This EEG study was done with scalp electrodes positioned according to the 10-20 International system of electrode placement. Electrical activity was acquired at a sampling rate of 500Hz  and reviewed with a high frequency filter of 70Hz  and a low frequency filter of 1Hz . EEG data were recorded continuously and digitally stored.   Description: The posterior dominant rhythm consists of 8 Hz activity of moderate voltage (25-35 uV) seen predominantly in posterior head regions, symmetric and reactive to eye opening and eye closing. Sleep was characterized by vertex waves, sleep spindles (12 to 14 Hz), maximal frontocentral region. EEG showed intermittent generalized and maximal left temporal region 3 to 6 Hz theta-delta slowing. Hyperventilation and photic stimulation were not performed.     ABNORMALITY - Intermittent slow, generalized and maximal left temporal region   IMPRESSION: This study is suggestive of cortical dysfunction arising from left temporal region as well as mild diffuse encephalopathy, nonspecific etiology.  No seizures or epileptiform discharges were seen throughout the recording.  [name] 

## 2021-11-10 NOTE — Procedures (Signed)
Central Venous Catheter Insertion Procedure Note  [name]  [id number]  11/26/1955  Date:11/10/21  Time:1:03 AM   Provider Performing:Rishikesh Khachatryan Mechele Collin   Procedure: Insertion of Non-tunneled Central Venous [phone]) with US guidance ([id number])   Indication(s) Medication administration  Consent Risks of the procedure as well as the alternatives and risks of each were explained to the patient and/or caregiver.  Consent for the procedure was obtained and is signed in the bedside chart  Anesthesia Topical only with 1% lidocaine   Timeout Verified patient identification, verified procedure, site/side was marked, verified correct patient position, special equipment/implants available, medications/allergies/relevant history reviewed, required imaging and test results available.  Sterile Technique Maximal sterile technique including full sterile barrier drape, hand hygiene, sterile gown, sterile gloves, mask, hair covering, sterile ultrasound probe cover (if used).  Procedure Description Area of catheter insertion was cleaned with chlorhexidine and draped in sterile fashion.  With real-time ultrasound guidance a central venous catheter was placed into the left internal jugular vein. Nonpulsatile blood flow and easy flushing noted in all ports.  The catheter was sutured in place and sterile dressing applied.     Complications/Tolerance None; patient tolerated the procedure well. Chest X-ray is ordered to verify placement for internal jugular or subclavian cannulation.   Chest x-ray is not ordered for femoral cannulation.  EBL Minimal  Specimen(s) None

## 2021-11-10 NOTE — Progress Notes (Signed)
eLink Physician-Brief Progress Note Patient Name: [name] DOB: [birth year] MRN: [id number]   Date of Service  11/10/2021  HPI/Events of Note  Neurology specifies goal SBP = 130-160. Nursing requests altering Norepinephrine IV infusion titration orders to reflect this goal SBP.  eICU Interventions  Plan: Norepinephrine IV infusion. Titrate to SBP = 130-160.     Intervention Category Major Interventions: Hypotension - evaluation and management  Rolly Magri Dennard Nip 11/10/2021, 10:59 PM

## 2021-11-10 NOTE — IPAL (Signed)
  Interdisciplinary Goals of Care Family Meeting   Date carried out: 11/10/2021  Location of the meeting: Phone conference  Member's involved: Physician, Bedside Registered Nurse, and Family Member or next of kin, Francoise Schaumann (sister). Ellin Mayhew RN  Durable Power of [employer] or acting medical decision maker: Immunologist (sister)    Discussion: We discussed goals of care for [facility] .  He is having worsening shock and requiring vasopressor support. We discussed placement for central line. She is distressed and saddened with his critical condition and states that he would not want aggressive care like this however she is hoping that he will improve. We discussed since he is intubated at this point that it would be reasonable to at least continue medical management overnight with vasopressor support. She does want a central line however does not want chest compressions or shocks in the event that his heart stops.  Code status: Limited Code or DNR with short term  Disposition: Continue current acute care  Time spent for the meeting: 20 min    Bentleigh Waren Mechele Collin, MD  11/10/2021, 12:15 AM

## 2021-11-10 NOTE — Progress Notes (Signed)
eLink Physician-Brief Progress Note Patient Name: [name] DOB: Aug 08, 1955 MRN: [id number]   Date of Service  11/10/2021  HPI/Events of Note  Nursing reports 8 beat run of NSVT.  Now in NSR with rate = 61. Sat = 97%, BP = 146/48.  eICU Interventions  Plan: BMP and Mg++ level STAT.     Intervention Category Major Interventions: Arrhythmia - evaluation and management  Kayonna Lawniczak Dennard Nip 11/10/2021, 10:33 PM

## 2021-11-10 NOTE — TOC CAGE-AID Note (Signed)
Transition of Care [facility]) - CAGE-AID Screening   Patient Details  Name: JASPER HANF MRN: [id number] Date of Birth: 09/13/1955  Transition of Care [facility]) CM/SW Contact:    Arelene Moroni C Tarpley-Carter, LCSWA Phone Number: 11/10/2021, 2:04 PM   Clinical Narrative: Pt is unable to participate in Cage Aid. Pt is on full vent.  CSW will assess at a better time.  Shequilla Goodgame Tarpley-Carter, MSW, LCSW-A Pronouns:  She/Her/Hers [facility] Health[facility]itions of Care Clinical Social Worker Direct Number:  [phone] [email or web address]  CAGE-AID Screening: Substance Abuse Screening unable to be completed due to: : Patient unable to participate

## 2021-11-10 NOTE — Progress Notes (Signed)
ANTICOAGULATION CONSULT NOTE - Follow Up Consult  Pharmacy Consult for heparin Indication: chest pain/ACS  Allergies  Allergen Reactions   Codeine Rash    Patient Measurements: Height: 5\' 7"  (170.2 cm) Weight: 53.3 kg (117 lb 8.1 oz) IBW/kg (Calculated) : 66.1 Heparin Dosing Weight: 57 kg  Vital Signs: Temp: 97.6 F (36.4 C) (05/29 1115) Temp Source: Oral (05/29 1115) BP: 97/58 (05/29 1200) Pulse Rate: 57 (05/29 1215)  Labs: Recent Labs    10/14/2021 1149 10/20/2021 1157 10/21/2021 1245 10/16/2021 1445 11/05/2021 1445 10/21/2021 1554 11/09/21 0624 11/09/21 1425 11/10/21 0013 11/10/21 0133 11/10/21 1158  HGB 12.6* 13.9  --  11.8*  --   --  10.9*  --  10.9* 10.9*  --   HCT 38.3* 41.0  --  36.1*  --   --  33.7*  --  32.0* 32.2*  --   PLT 209  --   --  199  --   --  189  --   --  204  --   APTT 24  --   --   --   --   --   --   --   --   --   --   LABPROT 14.2  --   --   --   --   --   --   --   --   --   --   INR 1.1  --   --   --   --   --   --   --   --   --   --   HEPARINUNFRC  --   --   --   --    < >  --  <0.10* <0.10*  --  0.17* 0.27*  CREATININE 0.70 0.60*  --   --   --   --  0.80  --   --  0.97  --   TROPONINIHS  --   --  530* 561*  --  557*  --   --   --   --   --    < > = values in this interval not displayed.    Estimated Creatinine Clearance: 55.7 mL/min (by C-G formula based on SCr of 0.97 mg/dL).   Medications:  Medications Prior to Admission  Medication Sig Dispense Refill Last Dose   albuterol (VENTOLIN HFA) 108 (90 Base) MCG/ACT inhaler 2 PUFFS EVERY 6 HOURS AS NEEDED FOR WHEEZING OR SHORTNESS OF BREATH (Patient taking differently: Inhale 2 puffs into the lungs every 6 (six) hours as needed for wheezing or shortness of breath.) 18 g 1 unknown   aspirin 325 MG tablet Take 325 mg by mouth daily.   10/25/2021   busPIRone (BUSPAR) 10 MG tablet TAKE ONE TABLET THREE TIMES DAILY (Patient taking differently: Take 10 mg by mouth 3 (three) times daily.) 90 tablet 1  11/04/2021   ipratropium-albuterol (DUONEB) 0.5-2.5 (3) MG/3ML SOLN INHALE 3 MLS BY NEBULIZER 4 TIMES A DAY (Patient taking differently: Take 3 mLs by nebulization every 6 (six) hours as needed (wheezing/shortness of breath).) 360 mL 0 unknown   mupirocin ointment (BACTROBAN) 2 % PLACE 1 APPLICATION INTO THE NOSE 2 TIMES A DAY (Patient taking differently: Apply 1 application. topically 2 (two) times daily. PLACE 1 APPLICATION INTO THE NOSE 2 TIMES A DAY) 22 g 5 Past Week   MYRBETRIQ 25 MG TB24 tablet TAKE ONE TABLET ONCE DAILY (Patient taking differently: Take 25 mg by mouth daily.) 30 tablet 5  10/14/2021   niacin (NIASPAN) 1000 MG CR tablet TAKE 1 TABLET BY MOUTH AT BEDTIME. (Patient taking differently: Take 1,000 mg by mouth at bedtime.) 90 tablet 0 11/03/2021   Nystatin (GERHARDT'S BUTT CREAM) CREA Apply 1 application topically daily. Equal parts nystatin and hydrocortisone and zinc oxide 1 each 2 Past Week   omeprazole (PRILOSEC) 40 MG capsule TAKE (1) CAPSULE DAILY (Patient taking differently: Take 40 mg by mouth daily.) 90 capsule 0 11/07/2021   PARoxetine (PAXIL) 20 MG tablet TAKE ONE TABLET ONCE DAILY (Patient taking differently: Take 20 mg by mouth daily.) 90 tablet 0 10/23/2021   Potassium 99 MG TABS Take 1 tablet by mouth daily.   11/07/2021   pramipexole (MIRAPEX) 0.5 MG tablet Take 2 tablets (1 mg total) by mouth at bedtime. 90 tablet 3 11/07/2021   risperiDONE (RISPERDAL) 2 MG tablet Take 1 tablet (2 mg total) by mouth at bedtime. 90 tablet 3 11/07/2021   simvastatin (ZOCOR) 20 MG tablet TAKE ONE TABLET AT BEDTIME (Patient taking differently: Take 20 mg by mouth daily at 6 PM.) 90 tablet 0 11/07/2021   thiamine 100 MG tablet Take 1 tablet (100 mg total) by mouth daily. 30 tablet 2 11/03/2021    Assessment: 66 year old male transferred from [facility] to [facility] for stroke and cardiac work up. History of recent CVA. Patient with negative CT and MRI for bleeding. Positive troponins to start IV heparin. Not on  anticoagulants prior to admission. He was taking aspirin 325 mg daily (was increased from 81 mg daily in August 2021 during admission for TIA w/ MRA head noting extensive intracranial atherosclerosis).  Heparin level 0.27  - below goal but increasing after rate increase overnight. Patient had bleeding from nose 2/2 to nostril suctioning overnight which is now resolved. CBC is stable.   Goal of Therapy:  Heparin level 0.3-0.5 units/ml Monitor platelets by anticoagulation protocol: Yes   Plan:  Increase heparin infusion to 1150 units/hr Check anti-Xa level in 6-8 hours and daily while on heparin Continue to monitor H&H and platelets F/u anticoag and antiplatelet plans  Sloan Leiter, PharmD, BCPS, BCCCP Clinical Pharmacist 11/10/2021 12:27 PM Please check AMION for all [place] numbers

## 2021-11-10 NOTE — Progress Notes (Signed)
ANTICOAGULATION CONSULT NOTE - Follow Up Consult  Pharmacy Consult for heparin Indication: chest pain/ACS  Allergies  Allergen Reactions   Codeine Rash    Patient Measurements: Height: 5\' 7"  (170.2 cm) Weight: 53.3 kg (117 lb 8.1 oz) IBW/kg (Calculated) : 66.1 Heparin Dosing Weight: 57 kg  Vital Signs: Temp: 99.6 F (37.6 C) (05/29 1956) Temp Source: Oral (05/29 1956) BP: 96/61 (05/29 1900) Pulse Rate: 63 (05/29 1900)  Labs: Recent Labs    11-19-21 1149 2021-11-19 1157 11/19/2021 1245 2021/11/19 1445 2021-11-19 1554 11/09/21 0624 11/09/21 1425 11/10/21 0013 11/10/21 0133 11/10/21 1158 11/10/21 1947  HGB 12.6* 13.9  --  11.8*  --  10.9*  --  10.9* 10.9*  --   --   HCT 38.3* 41.0  --  36.1*  --  33.7*  --  32.0* 32.2*  --   --   PLT 209  --   --  199  --  189  --   --  204  --   --   APTT 24  --   --   --   --   --   --   --   --   --   --   LABPROT 14.2  --   --   --   --   --   --   --   --   --   --   INR 1.1  --   --   --   --   --   --   --   --   --   --   HEPARINUNFRC  --   --   --   --   --  <0.10*   < >  --  0.17* 0.27* 0.24*  CREATININE 0.70 0.60*  --   --   --  0.80  --   --  0.97  --   --   TROPONINIHS  --   --  530* 561* 557*  --   --   --   --   --   --    < > = values in this interval not displayed.     Estimated Creatinine Clearance: 55.7 mL/min (by C-G formula based on SCr of 0.97 mg/dL).   Medications:  Medications Prior to Admission  Medication Sig Dispense Refill Last Dose   albuterol (VENTOLIN HFA) 108 (90 Base) MCG/ACT inhaler 2 PUFFS EVERY 6 HOURS AS NEEDED FOR WHEEZING OR SHORTNESS OF BREATH (Patient taking differently: Inhale 2 puffs into the lungs every 6 (six) hours as needed for wheezing or shortness of breath.) 18 g 1 unknown   aspirin 325 MG tablet Take 325 mg by mouth daily.   11/19/21   busPIRone (BUSPAR) 10 MG tablet TAKE ONE TABLET THREE TIMES DAILY (Patient taking differently: Take 10 mg by mouth 3 (three) times daily.) 90 tablet 1  11/19/21   ipratropium-albuterol (DUONEB) 0.5-2.5 (3) MG/3ML SOLN INHALE 3 MLS BY NEBULIZER 4 TIMES A DAY (Patient taking differently: Take 3 mLs by nebulization every 6 (six) hours as needed (wheezing/shortness of breath).) 360 mL 0 unknown   mupirocin ointment (BACTROBAN) 2 % PLACE 1 APPLICATION INTO THE NOSE 2 TIMES A DAY (Patient taking differently: Apply 1 application. topically 2 (two) times daily. PLACE 1 APPLICATION INTO THE NOSE 2 TIMES A DAY) 22 g 5 Past Week   MYRBETRIQ 25 MG TB24 tablet TAKE ONE TABLET ONCE DAILY (Patient taking differently: Take 25 mg by mouth daily.) 30 tablet  5 10/14/2021   niacin (NIASPAN) 1000 MG CR tablet TAKE 1 TABLET BY MOUTH AT BEDTIME. (Patient taking differently: Take 1,000 mg by mouth at bedtime.) 90 tablet 0 11/01/2021   Nystatin (GERHARDT'S BUTT CREAM) CREA Apply 1 application topically daily. Equal parts nystatin and hydrocortisone and zinc oxide 1 each 2 Past Week   omeprazole (PRILOSEC) 40 MG capsule TAKE (1) CAPSULE DAILY (Patient taking differently: Take 40 mg by mouth daily.) 90 capsule 0 10/16/2021   PARoxetine (PAXIL) 20 MG tablet TAKE ONE TABLET ONCE DAILY (Patient taking differently: Take 20 mg by mouth daily.) 90 tablet 0 10/26/2021   Potassium 99 MG TABS Take 1 tablet by mouth daily.   10/24/2021   pramipexole (MIRAPEX) 0.5 MG tablet Take 2 tablets (1 mg total) by mouth at bedtime. 90 tablet 3 11/07/2021   risperiDONE (RISPERDAL) 2 MG tablet Take 1 tablet (2 mg total) by mouth at bedtime. 90 tablet 3 11/07/2021   simvastatin (ZOCOR) 20 MG tablet TAKE ONE TABLET AT BEDTIME (Patient taking differently: Take 20 mg by mouth daily at 6 PM.) 90 tablet 0 11/07/2021   thiamine 100 MG tablet Take 1 tablet (100 mg total) by mouth daily. 30 tablet 2 10/28/2021    Assessment: 66 year old male transferred from [facility] to [facility] for stroke and cardiac work up. History of recent CVA. Patient with negative CT and MRI for bleeding. Positive troponins to start IV heparin. Not on  anticoagulants prior to admission. He was taking aspirin 325 mg daily (was increased from 81 mg daily in August 2021 during admission for TIA w/ MRA head noting extensive intracranial atherosclerosis).  Heparin level still subtherapeutic at 0.24 on heparin 1150 units/hr. Patient did have bleeding from nose 2/2 to nostril suctioning overnight (5/28-5/29). No issues with infusion or bleeding since. CBC is stable.   Goal of Therapy:  Heparin level 0.3-0.5 units/ml Monitor platelets by anticoagulation protocol: Yes   Plan:  Increase heparin infusion to 1200 units/hr Check anti-Xa level in 6-8 hours and daily while on heparin Continue to monitor H&H and platelets F/u anticoag and antiplatelet plans  Lissa Merlin, PharmD PGY1 Acute Care Pharmacy Resident  Phone: [phone] 11/10/2021  8:25 PM  Please check [email or web address] for unit-specific pharmacy phone numbers.

## 2021-11-10 NOTE — Progress Notes (Signed)
SLP Cancellation Note  Patient Details Name: [name] MRN: [id number] DOB: 05-Oct-1955   Cancelled treatment:       Reason Eval/Treat Not Completed: Patient not medically ready. Now intubated. Will sign off and await new orders.    Yurika Pereda, Riley Nearing 11/10/2021, 8:18 AM

## 2021-11-10 NOTE — Progress Notes (Addendum)
eLink Physician-Brief Progress Note Patient Name: ZAEDYN KOCOUREK DOB: [birth year] MRN: [id number]   Date of Service  11/10/2021  HPI/Events of Note  CXR reviewed after central line placement.   Left sided central line in place.  No pneumothorax.   eICU Interventions  OK to use central line.      Intervention Category Intermediate Interventions: Diagnostic test evaluation  Elsie Lincoln 11/10/2021, 1:38 AM  2:19 AM Notified that CVP is at 4 after 1L IVF bolus.   Levophed remains on levophed at 65mcg/min, neosynephrine at 159mcg/hr.   Pt is on PEEP 8, 50% FiO2.   BP 135/55, HR 68, RR 22, O2 sats 100%.   Plan> Give another 500cc IVF bolus.  Titrate FiO2 down to 40%.  Change the max dose of levophed now that pt has central line in place.  Code status changed to partial code   6:08 AM Notified of ventricular bigeminy. Pt off neosynephrine and on levophed at 30mcg/min with SBP in the 140s.  Electrolytes within normal limits.   Plan> Continue to monitor for now.  Titrate levophed as much as possible.

## 2021-11-10 NOTE — Progress Notes (Signed)
Brief cardiology note:  Reviewed overnight events. Patient had aspiration event, now intubated and on vasopressors.   I personally reviewed his echo from yesterday. The mitral valve leaflet appears abnormal in several views. Could consider TEE once more stable, though doubt he would be a candidate for open heart surgery if abnormality found on mitral valve. He is at elevated risk for TEE at this time, and if it would not change management, would wait until he is back to his baseline clinical status. His EF is preserved.  Given his decompensation and his comorbidities, I am concerned that his overall prognosis is likely to be poor. He is high risk for any vascular procedures at this time.  Would recommend continuing aspirin, heparin, and clopidogrel for now.   I will continue to follow the patient.  Jodelle Red, MD, PhD, [facility]  [facility]  [place]  [facility] at [facility] at [facility] [address], [address] [place], [place] [address] [phone]

## 2021-11-10 NOTE — Progress Notes (Signed)
PT Cancellation Note  Patient Details Name: [name] MRN: [id number] DOB: Jul 24, 1955   Cancelled Treatment:    Reason Eval/Treat Not Completed: Medical issues which prohibited therapy. Pt with decline in status and now intubated and sedated.   Angelina Ok [facility] 11/10/2021, 9:25 AM Skip Mayer PT Acute Rehabilitation Services Office [phone]

## 2021-11-10 NOTE — Progress Notes (Signed)
  Progress Note    11/10/2021 10:08 AM   Subjective: Events of overnight noted  Vitals:   11/10/21 0915 11/10/21 0930  BP:    Pulse: 63 70  Resp: 20 19  Temp:    SpO2: 100% 100%    Physical Exam: He is intubated and sedated on the ventilator without family at bedside  CBC    Component Value Date/Time   WBC 17.5 (H) 11/10/2021 0133   RBC 3.20 (L) 11/10/2021 0133   HGB 10.9 (L) 11/10/2021 0133   HGB 12.2 (L) 10/15/2021 1443   HCT 32.2 (L) 11/10/2021 0133   HCT 35.4 (L) 10/15/2021 1443   PLT 204 11/10/2021 0133   PLT 221 10/15/2021 1443   MCV 100.6 (H) 11/10/2021 0133   MCV 98 (H) 10/15/2021 1443   MCH 34.1 (H) 11/10/2021 0133   MCHC 33.9 11/10/2021 0133   RDW 12.9 11/10/2021 0133   RDW 12.3 10/15/2021 1443   LYMPHSABS 0.6 (L) 11/10/2021 0133   LYMPHSABS 1.5 10/15/2021 1443   MONOABS 2.0 (H) 11/10/2021 0133   EOSABS 0.0 11/10/2021 0133   EOSABS 0.0 10/15/2021 1443   BASOSABS 0.0 11/10/2021 0133   BASOSABS 0.1 10/15/2021 1443    BMET    Component Value Date/Time   NA 136 11/10/2021 0133   NA 138 10/15/2021 1443   K 4.0 11/10/2021 0133   CL 106 11/10/2021 0133   CO2 23 11/10/2021 0133   GLUCOSE 189 (H) 11/10/2021 0133   BUN 18 11/10/2021 0133   BUN 19 10/15/2021 1443   CREATININE 0.97 11/10/2021 0133   CREATININE 0.80 11/04/2012 1308   CALCIUM 7.8 (L) 11/10/2021 0133   GFRNONAA >60 11/10/2021 0133   GFRNONAA >89 11/04/2012 1308   GFRAA >60 02/03/2020 0734   GFRAA >89 11/04/2012 1308    INR    Component Value Date/Time   INR 1.1 10/29/2021 1149     Intake/Output Summary (Last 24 hours) at 11/10/2021 1008 Last data filed at 11/10/2021 0900 Gross per 24 hour  Intake 2805.04 ml  Output 1000 ml  Net 1805.04 ml     Assessment/plan:  66 y.o. male is here with concern for symptomatic carotid disease.  Duplex yesterday demonstrated patency bilaterally although severe calcification which is consistent with recent CT.  Unfortunately given the recent  events he is not a surgical candidate.  If he does recover from current aspiration event we would need to consider treatment of his carotid disease but this would be weeks to months in the future   Venisa Frampton C. Randie Heinz, MD [facility] Office: [phone] Pager: [phone]  11/10/2021 10:08 AM

## 2021-11-10 NOTE — Progress Notes (Addendum)
STROKE TEAM PROGRESS NOTE   SUBJECTIVE (INTERVAL HISTORY) No family is at the bedside. Pt had aspiration last night and was intubated and transferred to ICU. Currently he is on antibiotics, and ventilation. However, he is AAO x 3, neuro stable. TTE yesterday showed possible MV mass, probably needs TEE after stabilization. High risk for carotid revascularization procedure. VVS on board, will follow up on this issue later or as outpt.    OBJECTIVE Temp:  [96.5 F (35.8 C)-98.2 F (36.8 C)] 97.6 F (36.4 C) (05/29 1115) Pulse Rate:  [35-156] 60 (05/29 1415) Cardiac Rhythm: Normal sinus rhythm (05/29 0800) Resp:  [16-51] 25 (05/29 1415) BP: (73-141)/(49-76) 98/57 (05/29 1400) SpO2:  [81 %-100 %] 95 % (05/29 1415) Arterial Line BP: (106-155)/(43-59) 129/49 (05/29 1415) FiO2 (%):  [40 %-100 %] 40 % (05/29 1105) Weight:  [53.3 kg] 53.3 kg (05/29 0456)  Recent Labs  Lab 11/09/21 2329 11/10/21 0324 11/10/21 0717 11/10/21 1114  GLUCAP 205* 144* 177* 127*   Recent Labs  Lab 10/31/2021 1149 10/19/2021 1157 11/09/21 0624 11/10/21 0013 11/10/21 0133  NA 136 136 136 136 136  K 4.2 4.2 3.7 4.3 4.0  CL 102 98 103  --  106  CO2 29  --  23  --  23  GLUCOSE 114* 112* 88  --  189*  BUN 21 21 17   --  18  CREATININE 0.70 0.60* 0.80  --  0.97  CALCIUM 9.2  --  8.5*  --  7.8*  MG  --   --  2.0  --  2.1   Recent Labs  Lab 10/20/2021 1149 11/09/21 0624 11/10/21 0133  AST 32 29 44*  ALT 22 19 22   ALKPHOS 69 56 62  BILITOT 1.0 1.4* 1.0  PROT 7.2 5.7* 5.4*  ALBUMIN 3.9 2.9* 2.5*   Recent Labs  Lab 11/12/2021 1149 11/10/2021 1157 11/07/2021 1445 11/09/21 0624 11/10/21 0013 11/10/21 0133  WBC 14.5*  --  14.3* 11.8*  --  17.5*  NEUTROABS 11.9*  --   --   --   --  14.9*  HGB 12.6* 13.9 11.8* 10.9* 10.9* 10.9*  HCT 38.3* 41.0 36.1* 33.7* 32.0* 32.2*  MCV 101.6*  --  102.0* 101.5*  --  100.6*  PLT 209  --  199 189  --  204   No results for input(s): CKTOTAL, CKMB, CKMBINDEX, TROPONINI in the  last 168 hours. Recent Labs    11/11/2021 1149  LABPROT 14.2  INR 1.1   Recent Labs    10/21/2021 1608  COLORURINE YELLOW  LABSPEC 1.025  PHURINE 5.0  GLUCOSEU NEGATIVE  HGBUR SMALL*  BILIRUBINUR NEGATIVE  KETONESUR 20*  PROTEINUR 30*  NITRITE NEGATIVE  LEUKOCYTESUR NEGATIVE       Component Value Date/Time   CHOL 127 10/24/2021 1148   CHOL 125 10/15/2021 1443   TRIG 34 10/29/2021 1148   TRIG 101 10/03/2013 1654   HDL 64 11/05/2021 1148   HDL 63 10/15/2021 1443   HDL 64 10/03/2013 1654   CHOLHDL 2.0 11/04/2021 1148   VLDL 7 10/29/2021 1148   LDLCALC 56 10/13/2021 1148   LDLCALC 52 10/15/2021 1443   LDLCALC 45 10/03/2013 1654   Lab Results  Component Value Date   HGBA1C 6.2 (H) 11/09/2021      Component Value Date/Time   LABOPIA NONE DETECTED 10/16/2021 1608   COCAINSCRNUR NONE DETECTED 11/02/2021 1608   LABBENZ NONE DETECTED 10/19/2021 1608   AMPHETMU NONE DETECTED 11/01/2021 1608   THCU  NONE DETECTED 10/27/2021 1608   LABBARB NONE DETECTED 10/28/2021 1608    Recent Labs  Lab 10/20/2021 1149  ETH <10    I have personally reviewed the radiological images below and agree with the radiology interpretations.  CT ANGIO HEAD NECK W WO CM  Result Date: 11/09/2021 CLINICAL DATA:  Stroke workup EXAM: CT ANGIOGRAPHY HEAD AND NECK TECHNIQUE: Multidetector CT imaging of the head and neck was performed using the standard protocol during bolus administration of intravenous contrast. Multiplanar CT image reconstructions and MIPs were obtained to evaluate the vascular anatomy. Carotid stenosis measurements (when applicable) are obtained utilizing NASCET criteria, using the distal internal carotid diameter as the denominator. RADIATION DOSE REDUCTION: This exam was performed according to the departmental dose-optimization program which includes automated exposure control, adjustment of the mA and/or kV according to patient size and/or use of iterative reconstruction technique.  CONTRAST:  127mL OMNIPAQUE IOHEXOL 350 MG/ML SOLN COMPARISON:  Stroke MRI from yesterday FINDINGS: CT HEAD FINDINGS Brain: No evidence of acute infarction, hemorrhage, hydrocephalus, extra-axial collection or mass lesion/mass effect. Chronic small vessel ischemia in the deep white matter with chronic lacunar/perforator infarcts at the left deep gray nuclei and right caudate head. Generalized cerebral volume loss. Vascular: No hyperdense vessel or unexpected calcification. Skull: Remote left-sided burr holes.  No acute or aggressive finding Sinuses: Extensive bilateral sinus opacification at the level of the maxillary and asymmetric left ethmoid sinuses. Orbits: No acute finding Review of the MIP images confirms the above findings CTA NECK FINDINGS Aortic arch: Minimal coverage, the brachiocephalic artery origin is not seen. Atherosclerosis. Right carotid system: Diffuse calcified plaque with severe bulky plaque at the bifurcation completely obscuring a patent lumen. The downstream right ICA is under filled compared to the left. Left carotid system: Calcified plaque primarily at the common carotid origin and bifurcation. A mixed density plaque at the left ICA origin has a web-like component causing over 70% stenosis. The left ECA origin is occluded with downstream reconstitution. Vertebral arteries: Calcified plaque involving both proximal subclavian arteries. Proximal right subclavian stenosis measures 60%. Calcified plaque at both V1 segments with high-grade narrowing on the left. The right vertebral artery is dominant. No dissection or beading Skeleton: Generalized cervical spine degeneration which is advanced. Other neck: No acute finding Upper chest: Severe emphysema at the apices. Review of the MIP images confirms the above findings CTA HEAD FINDINGS Anterior circulation: Extensive calcified plaque along the bilateral carotid siphons to the degree that calcified plaque over estimates the diffuse narrowed  appearance. Mild to moderate bilateral ICA stenosis is possible. No major branch occlusion, beading, or aneurysm. Bilateral atheromatous narrowing of MCA branches with moderate left M2 and right M1 stenosis see seen on coronal thick MIPS. Narrow left A1 segment which is likely both acquired and congenital. Posterior circulation: The vertebral and basilar arteries are smoothly contoured and diffusely patent. No branch occlusion, beading, or aneurysm. Venous sinuses: Unremarkable Anatomic variants: None significant Review of the MIP images confirms the above findings IMPRESSION: 1. Severe bilateral proximal ICA stenosis due to advanced atherosclerosis. On the right a measurable channel is not visible and there is downstream underfilling. 2. 60% narrowing at the right proximal subclavian artery. The brachiocephalic origin was not covered on the scan. 3. Advanced narrowing at the origin of the non dominant left vertebral artery. 4. Moderate left M2 and right M1 segment stenoses. 5. Aortic Atherosclerosis (ICD10-I70.0) and Emphysema (ICD10-J43.9). Electronically Signed   By: Jorje Guild M.D.   On: 11/09/2021 11:47  MR BRAIN WO CONTRAST  Result Date: 10/31/2021 CLINICAL DATA:  Slurred speech bilateral weakness EXAM: MRI HEAD WITHOUT CONTRAST TECHNIQUE: Multiplanar, multiecho pulse sequences of the brain and surrounding structures were obtained without intravenous contrast. COMPARISON:  02/03/2020, correlation is also made with CT head 11/07/2021 FINDINGS: Evaluation is somewhat limited by motion artifact. Brain: No restricted diffusion to suggest acute or subacute infarct. No acute hemorrhage, mass, mass effect, or midline shift. No hydrocephalus or extra-axial collection. Cerebral volume loss is advanced for age. Confluent T2 hyperintense signal in the periventricular white matter and pons, likely the sequela of moderate to severe chronic small vessel ischemic disease. Redemonstrated lacunar infarcts in the  thalami and basal ganglia. Redemonstrated hemosiderin deposition in the left midbrain, likely sequela of prior hypertensive microhemorrhage. Vascular: Normal arterial flow voids. Skull and upper cervical spine: Normal marrow signal. Sinuses/Orbits: Chronic bilateral maxillary sinusitis. Mucosal thickening in the ethmoid air cells. No acute finding in the orbits. Other: Trace fluid in left mastoid tip. IMPRESSION: No acute intracranial process. No evidence of acute or subacute infarct. Electronically Signed   By: Wiliam KeAlison  Vasan M.D.   On: 11/09/2021 21:21   DG CHEST PORT 1 VIEW  Result Date: 11/10/2021 CLINICAL DATA:  Central line placement. EXAM: PORTABLE CHEST 1 VIEW COMPARISON:  Chest x-ray 11/09/2021. FINDINGS: Endotracheal tube tip is 5.4 cm above the carina. Enteric tube extends below the level of the diaphragm. Left sided central venous catheter tip projects over the SVC. The cardiomediastinal silhouette is stable and within normal limits. There are hazy patchy opacities in the left mid and lower lung, unchanged. There is central pulmonary vascular congestion, unchanged. There is no pleural effusion or pneumothorax visualized. IMPRESSION: 1. New left-sided central venous catheter tip projects over the SVC. 2. Stable left mid and lower lung airspace disease with central pulmonary vascular congestion. Electronically Signed   By: Darliss CheneyAmy  Guttmann M.D.   On: 11/10/2021 01:25   DG CHEST PORT 1 VIEW  Result Date: 11/09/2021 CLINICAL DATA:  Intubation, NG tube placement. EXAM: PORTABLE CHEST 1 VIEW COMPARISON:  11/09/2021. FINDINGS: The heart size and mediastinal contours are stable. The pulmonary vasculature is distended. Atherosclerotic calcification of the aorta is noted. Interstitial prominence and patchy perihilar airspace disease is noted bilaterally, increased from the prior exam. No effusion or pneumothorax. The endotracheal tube terminates 5.2 cm above the carina. There is gaseous distention of the  stomach with an enteric tube terminating in the distal stomach. No acute osseous abnormality. IMPRESSION: 1. Mildly distended pulmonary vasculature. 2. Interstitial prominence bilaterally with patchy perihilar airspace disease, increased from the prior exam, possible edema or infiltrate. 3. Support apparatus as detailed above. Electronically Signed   By: Thornell SartoriusLaura  Taylor M.D.   On: 11/09/2021 22:31   DG CHEST PORT 1 VIEW  Result Date: 11/09/2021 CLINICAL DATA:  Respiratory distress. EXAM: PORTABLE CHEST 1 VIEW COMPARISON:  11/09/2021. FINDINGS: The heart size and mediastinal contours are stable. The pulmonary vasculature is mildly distended. Increased interstitial and perihilar opacities are noted bilaterally. No effusion or pneumothorax. No acute osseous abnormality. IMPRESSION: 1. Mildly distended pulmonary vasculature. 2. Increased interstitial and perihilar airspace opacities suggesting edema, less likely infiltrate. Electronically Signed   By: Thornell SartoriusLaura  Taylor M.D.   On: 11/09/2021 20:51   DG Chest Port 1 View  Result Date: 11/09/2021 CLINICAL DATA:  Shortness of breath. EXAM: PORTABLE CHEST 1 VIEW COMPARISON:  12/03/2019 FINDINGS: 0558 hours. Rotated film. The lungs are clear without focal pneumonia, edema, pneumothorax or pleural effusion. Interstitial markings  are diffusely coarsened with chronic features. Cardiopericardial silhouette is at upper limits of normal for size. Bones are diffusely demineralized. Telemetry leads overlie the chest. IMPRESSION: Chronic interstitial coarsening. No acute cardiopulmonary findings. Electronically Signed   By: Misty Stanley M.D.   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  EEG to evaluate for seizure. Level of alertness: Awake, asleep AEDs during EEG study: None Technical aspects: This EEG study was done with scalp electrodes positioned according to the 10-20 International system of electrode placement. Electrical activity was acquired at a sampling rate of 500Hz  and reviewed with a high frequency filter of 70Hz  and a low frequency filter of 1Hz . EEG data were recorded continuously and digitally stored. Description: The posterior dominant rhythm consists of 8 Hz activity of moderate voltage (25-35 uV) seen predominantly in posterior head regions, symmetric and reactive to eye opening and eye closing. Sleep was characterized by vertex waves, sleep spindles (12 to 14 Hz), maximal frontocentral region. EEG showed intermittent generalized and maximal left temporal region 3 to 6 Hz theta-delta slowing. Hyperventilation and photic stimulation were not performed.   ABNORMALITY - Intermittent slow, generalized and maximal left temporal region IMPRESSION: This study is suggestive of cortical dysfunction arising from left temporal region as well as mild diffuse encephalopathy, nonspecific etiology.  No seizures or epileptiform discharges were seen throughout the recording. Lora Havens   ECHOCARDIOGRAM COMPLETE  Result Date: 11/09/2021    ECHOCARDIOGRAM REPORT   Patient Name:   [name] Date of Exam: 11/09/2021 Medical Rec #:  [id number]           Height:       67.0 in Accession #:    [id number]          Weight:       126.3 lb Date of Birth:  1955-07-04           BSA:          1.664 m Patient Age:    81 years            BP:           119/53 mmHg Patient Gender: M                   HR:           79 bpm. Exam Location:  Inpatient Procedure: 2D Echo, Cardiac Doppler and Color Doppler Indications:    Stroke  History:        Patient has prior history of Echocardiogram examinations, most                 recent 02/03/2020. CAD, TIA and  COPD; Risk Factors:Current                 Smoker, Hypertension and Dyslipidemia.  Sonographer:    Clayton Lefort RDCS (AE) Referring Phys: [id number] Margaree Mackintosh [place]  1. Left ventricular ejection fraction, by estimation, is 60 to 65%. The left ventricle has normal function. The left ventricle has no regional wall motion abnormalities. Left ventricular diastolic parameters were normal.  2. Right ventricular systolic function is normal. The right ventricular size is normal.  3. Only in  parasternal long axis views ? mass on atrial surface of posterior leaflet may be shadowing artifact but in setting of TIA consider f/u TEE if clinically indicated . The mitral valve is abnormal. Trivial mitral valve regurgitation. No evidence  of mitral stenosis.  4. The aortic valve is tricuspid. There is mild calcification of the aortic valve. There is mild thickening of the aortic valve. Aortic valve regurgitation is not visualized. Aortic valve sclerosis is present, with no evidence of aortic valve stenosis.  5. The inferior vena cava is dilated in size with >50% respiratory variability, suggesting right atrial pressure of 8 mmHg. FINDINGS  Left Ventricle: Left ventricular ejection fraction, by estimation, is 60 to 65%. The left ventricle has normal function. The left ventricle has no regional wall motion abnormalities. The left ventricular internal cavity size was normal in size. There is  no left ventricular hypertrophy. Left ventricular diastolic parameters were normal. Right Ventricle: The right ventricular size is normal. No increase in right ventricular wall thickness. Right ventricular systolic function is normal. Left Atrium: Left atrial size was normal in size. Right Atrium: Right atrial size was normal in size. Pericardium: There is no evidence of pericardial effusion. Mitral Valve: Only in parasternal long axis views ? mass on atrial surface of posterior leaflet may be shadowing artifact but in setting of TIA consider  f/u TEE if clinically indicated. The mitral valve is abnormal. There is mild thickening of the mitral  valve leaflet(s). Trivial mitral valve regurgitation. No evidence of mitral valve stenosis. Tricuspid Valve: The tricuspid valve is normal in structure. Tricuspid valve regurgitation is not demonstrated. No evidence of tricuspid stenosis. Aortic Valve: The aortic valve is tricuspid. There is mild calcification of the aortic valve. There is mild thickening of the aortic valve. Aortic valve regurgitation is not visualized. Aortic valve sclerosis is present, with no evidence of aortic valve stenosis. Aortic valve mean gradient measures 2.0 mmHg. Aortic valve peak gradient measures 4.2 mmHg. Aortic valve area, by VTI measures 3.33 cm. Pulmonic Valve: The pulmonic valve was normal in structure. Pulmonic valve regurgitation is not visualized. No evidence of pulmonic stenosis. Aorta: The aortic root is normal in size and structure. Venous: The inferior vena cava is dilated in size with greater than 50% respiratory variability, suggesting right atrial pressure of 8 mmHg. IAS/Shunts: No atrial level shunt detected by color flow Doppler.  LEFT VENTRICLE PLAX 2D LVIDd:         5.10 cm   Diastology LVIDs:         3.80 cm   LV e' medial:    7.83 cm/s LV PW:         0.90 cm   LV E/e' medial:  12.5 LV IVS:        1.20 cm   LV e' lateral:   8.16 cm/s LVOT diam:     2.20 cm   LV E/e' lateral: 12.0 LV SV:         62 LV SV Index:   37 LVOT Area:     3.80 cm  RIGHT VENTRICLE             IVC RV Basal diam:  2.90 cm     IVC diam: 2.60 cm RV S prime:     12.50 cm/s TAPSE (M-mode): 2.2 cm LEFT ATRIUM             Index        RIGHT ATRIUM  Index LA diam:        3.20 cm 1.92 cm/m   RA Area:     18.40 cm LA Vol (A2C):   49.8 ml 29.94 ml/m  RA Volume:   43.60 ml  26.21 ml/m LA Vol (A4C):   52.7 ml 31.68 ml/m LA Biplane Vol: 54.1 ml 32.52 ml/m  AORTIC VALVE AV Area (Vmax):    3.54 cm AV Area (Vmean):   3.37 cm AV Area (VTI):      3.33 cm AV Vmax:           102.00 cm/s AV Vmean:          65.600 cm/s AV VTI:            0.186 m AV Peak Grad:      4.2 mmHg AV Mean Grad:      2.0 mmHg LVOT Vmax:         94.90 cm/s LVOT Vmean:        58.200 cm/s LVOT VTI:          0.163 m LVOT/AV VTI ratio: 0.88  AORTA Ao Root diam: 3.20 cm Ao Asc diam:  2.70 cm MITRAL VALVE MV Area (PHT): 5.62 cm    SHUNTS MV Decel Time: 135 msec    Systemic VTI:  0.16 m MV E velocity: 98.00 cm/s  Systemic Diam: 2.20 cm MV A velocity: 56.10 cm/s MV E/A ratio:  1.75 Jenkins Rouge MD Electronically signed by Jenkins Rouge MD Signature Date/Time: 11/09/2021/2:11:08 PM    Final    CT HEAD CODE STROKE WO CONTRAST  Result Date: 10/18/2021 CLINICAL DATA:  Code stroke. Stroke suspected. Right-sided weakness for 23 hours EXAM: CT HEAD WITHOUT CONTRAST TECHNIQUE: Contiguous axial images were obtained from the base of the skull through the vertex without intravenous contrast. RADIATION DOSE REDUCTION: This exam was performed according to the departmental dose-optimization program which includes automated exposure control, adjustment of the mA and/or kV according to patient size and/or use of iterative reconstruction technique. COMPARISON:  09/14/2021 FINDINGS: Brain: No evidence of acute infarction, hemorrhage, hydrocephalus, extra-axial collection or mass lesion/mass effect. Multiple chronic lacunar infarcts at the bilateral deep gray nuclei, especially the bilateral thalamus. 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 Premature chronic small vessel ischemia. Confluence clinic gliosis in the cerebral white matter. Vascular: No hyperdense vessel or unexpected calcification. Skull: Normal. Negative for fracture or focal lesion. Prior burr holes on the left. Sinuses/Orbits: Chronic bilateral maxillary sinusitis with left more than right opacification and sclerotic wall thickening. ASPECTS [facility] Stroke Program Early CT Score) - Ganglionic level infarction  (caudate, lentiform nuclei, internal capsule, insula, M1-M3 cortex): 7 - Supraganglionic infarction (M4-M6 cortex): 3 Total score (0-10 with 10 being normal): 10 IMPRESSION: 1. No acute finding. 2. Advanced chronic small vessel ischemia. Generalized cerebral atrophy. 3. Chronic sinusitis. Electronically Signed   By: Jorje Guild M.D.   On: 10/30/2021 12:04   VAS US CAROTID  Result Date: 11/10/2021 Carotid Arterial Duplex Study Patient Name:  [name]  Date of Exam:   11/09/2021 Medical Rec #: [id number]            Accession #:    [id number] Date of Birth: [birth year]            Patient Gender: M Patient Age:   88 years Exam Location:  [facility] Procedure:      VAS US CAROTID Referring Phys: Servando Snare --------------------------------------------------------------------------------  Indications:  CVA, Speech disturbance and Weakness. NSTEMI. Risk Factors:      Hypertension, hyperlipidemia, coronary artery disease, prior                    CVA and TBI. Other Factors:     CTA of neck done 11/09/21 indicated "Severe bilateral proximal                    ICA stenosis due to advanced atherosclerosis. On the right a                    measurable channel is not visible and there is downstream                    underfilling." Limitations        Today's exam was limited due to heavy calcification and the                    resulting shadowing and coughing and shaking of legs. Comparison Study:  Prior carotid duplex done at [facility] 11/14/19 indicated                    R:80-99% ICA stenosis, highest end of range and L:60-79% ICA                    stenosis, highest end of range Performing Technologist: Sharion Dove RVS  Examination Guidelines: A complete evaluation includes B-mode imaging, spectral Doppler, color Doppler, and power Doppler as needed of all accessible portions of each vessel. Bilateral testing is considered an integral part of a complete examination. Limited examinations for  reoccurring indications may be performed as noted.  Right Carotid Findings: +----------+--------+--------+--------+----------------------+---------+           PSV cm/sEDV cm/sStenosisPlaque Description    Comments  +----------+--------+--------+--------+----------------------+---------+ CCA Prox  50      9               calcific              Shadowing +----------+--------+--------+--------+----------------------+---------+ CCA Distal24      6               calcific              Shadowing +----------+--------+--------+--------+----------------------+---------+ ICA Prox  453     74              calcific and irregularShadowing +----------+--------+--------+--------+----------------------+---------+ ECA       95      11                                              +----------+--------+--------+--------+----------------------+---------+ +----------+--------+-------+--------+-------------------+           PSV cm/sEDV cmsDescribeArm Pressure (mmHG) +----------+--------+-------+--------+-------------------+ Subclavian110                                        +----------+--------+-------+--------+-------------------+ +---------+--------+---+--------+--+ VertebralPSV cm/s214EDV cm/s49 +---------+--------+---+--------+--+  Left Carotid Findings: +----------+-------+--------+--------+--------------------+--------------------+           PSV    EDV cm/sStenosisPlaque Description  Comments                       cm/s                                                            +----------+-------+--------+--------+--------------------+--------------------+  CCA Prox  135    27              heterogenous                             +----------+-------+--------+--------+--------------------+--------------------+ CCA Distal126    21              heterogenous                              +----------+-------+--------+--------+--------------------+--------------------+ ICA Prox  581    132     80-99%  calcific and                                                              irregular                                +----------+-------+--------+--------+--------------------+--------------------+ ICA Mid   196    47                                                       +----------+-------+--------+--------+--------------------+--------------------+ ICA Distal81     20                                                       +----------+-------+--------+--------+--------------------+--------------------+ ECA       263    35                                  mid to distal                                                             portion              +----------+-------+--------+--------+--------------------+--------------------+ +----------+--------+--------+--------+-------------------+           PSV cm/sEDV cm/sDescribeArm Pressure (mmHG) +----------+--------+--------+--------+-------------------+ [id number]                                          +----------+--------+--------+--------+-------------------+ +---------+--------+--+--------+-+ VertebralPSV cm/s21EDV cm/s8 +---------+--------+--+--------+-+   Summary: Right Carotid: Near occlusion of the proximal right ICA. Left Carotid: Velocities in the left ICA are consistent with a 80-99% stenosis. Vertebrals:  Bilateral vertebral arteries demonstrate antegrade flow. Subclavians: Normal flow hemodynamics were seen in bilateral subclavian              arteries. *See table(s) above for measurements and observations.  Electronically signed by Servando Snare MD on 11/10/2021 at 10:56:26 AM.  Final      PHYSICAL EXAM  Temp:  [96.5 F (35.8 C)-98.2 F (36.8 C)] 97.6 F (36.4 C) (05/29 1115) Pulse Rate:  [35-156] 60 (05/29 1415) Resp:  [16-51] 25 (05/29 1415) BP: (73-141)/(49-76) 98/57  (05/29 1400) SpO2:  [81 %-100 %] 95 % (05/29 1415) Arterial Line BP: (106-155)/(43-59) 129/49 (05/29 1415) FiO2 (%):  [40 %-100 %] 40 % (05/29 1105) Weight:  [53.3 kg] 53.3 kg (05/29 0456)  General - Well nourished, well developed, intubated on sedation.  Ophthalmologic - fundi not visualized due to noncooperation.  Cardiovascular - Regular rate and rhythm.  Neuro - intubated on Precedex and fentanyl, eyes closed but easily open with voice, able to follow all simple commands. Eyes in mid position, blinking to visual threat, bilaterally tracking, PERRL. Corneal reflex present , gag and cough present. Breathing over the vent.  Facial symmetry not able to test due to ET tube.  Able to perform tongue protrusion. Moving all extremities symmetrically. Sensation symmetrical with nodding head for answers, coordination grossly intact b/l FTN and gait not tested.    ASSESSMENT/PLAN [name] is a 66 y.o. male with history of TBI with chronic dysarthria, seizure, hypertension, hyperlipidemia, CAD, OSA, carotid stenosis bilaterally admitted for worsening dysarthria, bilateral leg weakness. No tPA given due to outside window.    TIA likely due to bilateral ICA stenosis in the setting of soft BP CT no acute abnormality  MRI no acute infarct CT head and neck severe bilateral proximal ICA stenosis, right more than left, and decreased downstream flow on the right.  Bilateral ICA siphon moderate to severe stenosis.  Moderate left M2 and right M1 segment stenosis.  60% right proximal subclavian artery stenosis.  Left VA origin stenosis. Carotid Doppler Right near occlusion and left ICA 80-99% stenosis 2D Echo EF 60 to 65%, ? MV mass on the atrial surface EEG no seizure, cortical dysfunction arising from left temporal region as well as mild diffuse encephalopathy LDL 56 HgbA1c 6.2 Heparin IV for VTE prophylaxis aspirin 325 mg daily prior to admission, now on ASA 81 and plavix 75 as well as  heparin IV for non-STEMI. 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  EEG negative.  CT/MRI/MRA negative.  However, carotid Doppler showed bilateral ICA more than 70% stenosis, right more than left.  EF 55 to 60%.  Discharged on aspirin 325.  Bilateral carotid stenosis 01/2020 carotid Doppler showed bilateral ICA > 70% stenosis, right more than left CT head and neck severe bilateral proximal ICA stenosis, right more than left, and decreased downstream flow on the right.  Carotid Doppler right ICA near occlusion, left ICA 80 to 99% stenosis Vascular surgery Dr. Donzetta Matters on board Given current comorbidities, not a good candidate for carotid revascularization at this time  ? Mitral valve mass/vegetation Non-STEMI TTE showed ? MV mass/vegetation on the atrial surface Cardiology on board TEE recommended once stabilized High risk for cardiovascular procedure at the time On ASA, Plavix and heparin IV  Hypotension History of hypertension Low BP after intubation On Levophed drip Off Neo Avoid low BP given bilateral extracranial carotid stenosis and intracranial stenosis BP goal 130-1 60 before carotid revascularization  Hyperlipidemia Home meds: Zocor 20 LDL 56, goal < 70 Now on Zocor 20 given LDL at goal  Continue statin at discharge  Other Stroke Risk Factors Advanced age Obstructive sleep apnea, on CPAP at home Coronary artery disease  Other Active Problems History of TBI with residual of severe dysarthria  Hospital day # 2  This patient is critically ill due to respiratory failure, bilateral carotid high-grade stenosis, TIA, mitral valve vegetation and at significant risk of neurological worsening, death  form stroke, heart failure, sepsis. This patient's care requires constant monitoring of vital signs, hemodynamics, respiratory and cardiac monitoring, review of multiple databases, neurological assessment, discussion with family, other specialists and medical decision making of high complexity. I spent 40 minutes of neurocritical care time in the care of this patient.  Rosalin Hawking, MD PhD Stroke Neurology 11/10/2021 2:28 PM    To contact Stroke Continuity provider, please refer to [email or web address] After hours, contact General Neurology

## 2021-11-10 NOTE — Procedures (Signed)
Arterial Catheter Insertion Procedure Note  [name]  [id number]  [birth year]  Date:11/10/21  Time:12:04 AM    Provider Performing: Epifanio Lesches A    Procedure: Insertion of Arterial Line (29798) without US guidance  Indication(s) Blood pressure monitoring and/or need for frequent ABGs  Consent Risks of the procedure as well as the alternatives and risks of each were explained to the patient and/or caregiver.  Consent for the procedure was obtained and is signed in the bedside chart  Anesthesia None   Time Out Verified patient identification, verified procedure, site/side was marked, verified correct patient position, special equipment/implants available, medications/allergies/relevant history reviewed, required imaging and test results available.   Sterile Technique Maximal sterile technique including full sterile barrier drape, hand hygiene, sterile gown, sterile gloves, mask, hair covering, sterile ultrasound probe cover (if used).   Procedure Description Area of catheter insertion was cleaned with chlorhexidine and draped in sterile fashion. Without real-time ultrasound guidance an arterial catheter was placed into the left radial artery.  Appropriate arterial tracings confirmed on monitor.     Complications/Tolerance None; patient tolerated the procedure well.   EBL Minimal   Specimen(s) None

## 2021-11-10 NOTE — Progress Notes (Signed)
EEG complete - results pending 

## 2021-11-10 NOTE — Progress Notes (Signed)
   11/09/21 2015  Assess: MEWS Score  ECG Heart Rate (!) 154  Resp (!) 41  Level of Consciousness Alert  SpO2 (!) 81 %  O2 Device Room Air  Patient Activity (if Appropriate) In bed (was eating his dinner)  Assess: MEWS Score  MEWS Temp 0  MEWS Systolic 0  MEWS Pulse 3  MEWS RR 3  MEWS LOC 0  MEWS Score 6  MEWS Score Color Red  Assess: if the MEWS score is Yellow or Red  Were vital signs taken at a resting state? No  Focused Assessment Change from prior assessment (see assessment flowsheet)  Early Detection of Sepsis Score *See Row Information* Low  MEWS guidelines implemented *See Row Information* Yes  Treat  MEWS Interventions Consulted Respiratory Therapy;Escalated (See documentation below)  Pain Scale 0-10  Take Vital Signs  Increase Vital Sign Frequency  Red: Q 1hr X 4 then Q 4hr X 4, if remains red, continue Q 4hrs  Escalate  MEWS: Escalate Red: discuss with charge nurse/RN and provider, consider discussing with RRT  Notify: Charge Nurse/RN  Name of Charge Nurse/RN Notified [email or web address]  Date Charge Nurse/RN Notified 11/09/21  Time Charge Nurse/RN Notified 2016  Notify: Provider  Provider Name/Title DR. Arvilla Market  Date Provider Notified 11/09/21  Time Provider Notified 2018  Method of Notification Page  Notification Reason Change in status  Provider response See new orders  Date of Provider Response 11/09/21  Document  Patient Outcome Stabilized after interventions  Progress note created (see row info) Yes

## 2021-11-10 NOTE — Progress Notes (Addendum)
OT Cancellation Note  Patient Details Name: [name] MRN: [id number] DOB: 03-14-1956   Cancelled Treatment:    Reason Eval/Treat Not Completed: Medical issues which prohibited therapy (Intubated. Will hold therapy eval and return as pt medically stable.)  [place], OTR/L Acute Rehab Office: [phone] 11/10/2021, 1:45 PM

## 2021-11-10 NOTE — Progress Notes (Signed)
ANTICOAGULATION CONSULT NOTE - Follow Up Consult  Pharmacy Consult for heparin Indication: chest pain/ACS  Allergies  Allergen Reactions   Codeine Rash    Patient Measurements: Height: 5\' 7"  (170.2 cm) Weight: 53.3 kg (117 lb 8.1 oz) IBW/kg (Calculated) : 66.1 Heparin Dosing Weight: 57 kg  Vital Signs: Temp: 96.5 F (35.8 C) (05/28 2300) Temp Source: Axillary (05/28 2300) BP: 99/68 (05/29 0200) Pulse Rate: 71 (05/29 0200)  Labs: Recent Labs    11/05/2021 1149 11/09/2021 1157 11/04/2021 1245 11/01/2021 1445 10/19/2021 1554 11/09/21 0624 11/09/21 1425 11/10/21 0013 11/10/21 0133  HGB 12.6* 13.9  --  11.8*  --  10.9*  --  10.9* 10.9*  HCT 38.3* 41.0  --  36.1*  --  33.7*  --  32.0* 32.2*  PLT 209  --   --  199  --  189  --   --  204  APTT 24  --   --   --   --   --   --   --   --   LABPROT 14.2  --   --   --   --   --   --   --   --   INR 1.1  --   --   --   --   --   --   --   --   HEPARINUNFRC  --   --   --   --   --  <0.10* <0.10*  --  0.17*  CREATININE 0.70 0.60*  --   --   --  0.80  --   --  0.97  TROPONINIHS  --   --  530* 561* 557*  --   --   --   --      Estimated Creatinine Clearance: 55.7 mL/min (by C-G formula based on SCr of 0.97 mg/dL).   Medications:  Medications Prior to Admission  Medication Sig Dispense Refill Last Dose   albuterol (VENTOLIN HFA) 108 (90 Base) MCG/ACT inhaler 2 PUFFS EVERY 6 HOURS AS NEEDED FOR WHEEZING OR SHORTNESS OF BREATH (Patient taking differently: Inhale 2 puffs into the lungs every 6 (six) hours as needed for wheezing or shortness of breath.) 18 g 1 unknown   aspirin 325 MG tablet Take 325 mg by mouth daily.   10/27/2021   busPIRone (BUSPAR) 10 MG tablet TAKE ONE TABLET THREE TIMES DAILY (Patient taking differently: Take 10 mg by mouth 3 (three) times daily.) 90 tablet 1 10/24/2021   ipratropium-albuterol (DUONEB) 0.5-2.5 (3) MG/3ML SOLN INHALE 3 MLS BY NEBULIZER 4 TIMES A DAY (Patient taking differently: Take 3 mLs by nebulization  every 6 (six) hours as needed (wheezing/shortness of breath).) 360 mL 0 unknown   mupirocin ointment (BACTROBAN) 2 % PLACE 1 APPLICATION INTO THE NOSE 2 TIMES A DAY (Patient taking differently: Apply 1 application. topically 2 (two) times daily. PLACE 1 APPLICATION INTO THE NOSE 2 TIMES A DAY) 22 g 5 Past Week   MYRBETRIQ 25 MG TB24 tablet TAKE ONE TABLET ONCE DAILY (Patient taking differently: Take 25 mg by mouth daily.) 30 tablet 5 11/05/2021   niacin (NIASPAN) 1000 MG CR tablet TAKE 1 TABLET BY MOUTH AT BEDTIME. (Patient taking differently: Take 1,000 mg by mouth at bedtime.) 90 tablet 0 10/30/2021   Nystatin (GERHARDT'S BUTT CREAM) CREA Apply 1 application topically daily. Equal parts nystatin and hydrocortisone and zinc oxide 1 each 2 Past Week   omeprazole (PRILOSEC) 40 MG capsule TAKE (1) CAPSULE DAILY (Patient  taking differently: Take 40 mg by mouth daily.) 90 capsule 0 11/12/2021   PARoxetine (PAXIL) 20 MG tablet TAKE ONE TABLET ONCE DAILY (Patient taking differently: Take 20 mg by mouth daily.) 90 tablet 0 10/13/2021   Potassium 99 MG TABS Take 1 tablet by mouth daily.   11/02/2021   pramipexole (MIRAPEX) 0.5 MG tablet Take 2 tablets (1 mg total) by mouth at bedtime. 90 tablet 3 11/07/2021   risperiDONE (RISPERDAL) 2 MG tablet Take 1 tablet (2 mg total) by mouth at bedtime. 90 tablet 3 11/07/2021   simvastatin (ZOCOR) 20 MG tablet TAKE ONE TABLET AT BEDTIME (Patient taking differently: Take 20 mg by mouth daily at 6 PM.) 90 tablet 0 11/07/2021   thiamine 100 MG tablet Take 1 tablet (100 mg total) by mouth daily. 30 tablet 2 10/20/2021    Assessment: 66 year old male transferred from [facility] to [facility] for stroke and cardiac work up. History of recent CVA. Patient with negative CT and MRI for bleeding. Positive troponins to start IV heparin. Not on anticoagulants prior to admission. He was taking aspirin 325 mg daily (was increased from 81 mg daily in August 2021 during admission for TIA w/ MRA head noting  extensive intracranial atherosclerosis).  Heparin level 0.17, patient acutely decompensated last evening and had to be transferred to the ICU. RN reports heparin was paused briefly during transition from the floor to the ICU. Also reports bleeding from nose 2/2 to nostril suctioning earlier, which is now controlled with gauze packing. CBC stable   Goal of Therapy:  Heparin level 0.3-0.5 units/ml Monitor platelets by anticoagulation protocol: Yes   Plan:  Increase heparin infusion to 1100 units/hr Check anti-Xa level in 6-8 hours and daily while on heparin Continue to monitor H&H and platelets F/u anticoag and antiplatelet plans  Georga Bora, PharmD Clinical Pharmacist 11/10/2021 2:37 AM Please check AMION for all [place] numbers

## 2021-11-11 ENCOUNTER — Inpatient Hospital Stay (HOSPITAL_COMMUNITY): Payer: Medicare Other

## 2021-11-11 DIAGNOSIS — I214 Non-ST elevation (NSTEMI) myocardial infarction: Secondary | ICD-10-CM | POA: Diagnosis not present

## 2021-11-11 DIAGNOSIS — R778 Other specified abnormalities of plasma proteins: Secondary | ICD-10-CM | POA: Diagnosis not present

## 2021-11-11 DIAGNOSIS — G40909 Epilepsy, unspecified, not intractable, without status epilepticus: Secondary | ICD-10-CM | POA: Diagnosis not present

## 2021-11-11 DIAGNOSIS — I330 Acute and subacute infective endocarditis: Secondary | ICD-10-CM | POA: Diagnosis not present

## 2021-11-11 DIAGNOSIS — I639 Cerebral infarction, unspecified: Secondary | ICD-10-CM | POA: Diagnosis not present

## 2021-11-11 DIAGNOSIS — J690 Pneumonitis due to inhalation of food and vomit: Secondary | ICD-10-CM | POA: Diagnosis not present

## 2021-11-11 DIAGNOSIS — E43 Unspecified severe protein-calorie malnutrition: Secondary | ICD-10-CM | POA: Diagnosis present

## 2021-11-11 DIAGNOSIS — G459 Transient cerebral ischemic attack, unspecified: Secondary | ICD-10-CM | POA: Diagnosis not present

## 2021-11-11 LAB — CBC WITH DIFFERENTIAL/PLATELET
Abs Immature Granulocytes: 0.05 10*3/uL (ref 0.00–0.07)
Basophils Absolute: 0.1 10*3/uL (ref 0.0–0.1)
Basophils Relative: 1 %
Eosinophils Absolute: 0.1 10*3/uL (ref 0.0–0.5)
Eosinophils Relative: 1 %
HCT: 31.5 % — ABNORMAL LOW (ref 39.0–52.0)
Hemoglobin: 10.6 g/dL — ABNORMAL LOW (ref 13.0–17.0)
Immature Granulocytes: 0 %
Lymphocytes Relative: 13 %
Lymphs Abs: 1.7 10*3/uL (ref 0.7–4.0)
MCH: 34.2 pg — ABNORMAL HIGH (ref 26.0–34.0)
MCHC: 33.7 g/dL (ref 30.0–36.0)
MCV: 101.6 fL — ABNORMAL HIGH (ref 80.0–100.0)
Monocytes Absolute: 1.4 10*3/uL — ABNORMAL HIGH (ref 0.1–1.0)
Monocytes Relative: 11 %
Neutro Abs: 9.4 10*3/uL — ABNORMAL HIGH (ref 1.7–7.7)
Neutrophils Relative %: 74 %
Platelets: 171 10*3/uL (ref 150–400)
RBC: 3.1 MIL/uL — ABNORMAL LOW (ref 4.22–5.81)
RDW: 13 % (ref 11.5–15.5)
WBC: 12.7 10*3/uL — ABNORMAL HIGH (ref 4.0–10.5)
nRBC: 0 % (ref 0.0–0.2)

## 2021-11-11 LAB — COMPREHENSIVE METABOLIC PANEL
ALT: 22 U/L (ref 0–44)
AST: 39 U/L (ref 15–41)
Albumin: 2.3 g/dL — ABNORMAL LOW (ref 3.5–5.0)
Alkaline Phosphatase: 62 U/L (ref 38–126)
Anion gap: 7 (ref 5–15)
BUN: 17 mg/dL (ref 8–23)
CO2: 24 mmol/L (ref 22–32)
Calcium: 7.9 mg/dL — ABNORMAL LOW (ref 8.9–10.3)
Chloride: 103 mmol/L (ref 98–111)
Creatinine, Ser: 0.7 mg/dL (ref 0.61–1.24)
GFR, Estimated: >60 mL/min (ref >60)
Glucose, Bld: 128 mg/dL — ABNORMAL HIGH (ref 70–99)
Potassium: 3.7 mmol/L (ref 3.5–5.1)
Sodium: 134 mmol/L — ABNORMAL LOW (ref 135–145)
Total Bilirubin: 0.8 mg/dL (ref 0.3–1.2)
Total Protein: 5.2 g/dL — ABNORMAL LOW (ref 6.5–8.1)

## 2021-11-11 LAB — CULTURE, RESPIRATORY W GRAM STAIN

## 2021-11-11 LAB — GLUCOSE, CAPILLARY
Glucose-Capillary: 117 mg/dL — ABNORMAL HIGH (ref 70–99)
Glucose-Capillary: 140 mg/dL — ABNORMAL HIGH (ref 70–99)
Glucose-Capillary: 89 mg/dL (ref 70–99)
Glucose-Capillary: 91 mg/dL (ref 70–99)
Glucose-Capillary: 96 mg/dL (ref 70–99)
Glucose-Capillary: 130 mg/dL — ABNORMAL HIGH (ref 70–99)

## 2021-11-11 LAB — CBG MONITORING, ED: Glucose-Capillary: 102 mg/dL — ABNORMAL HIGH (ref 70–99)

## 2021-11-11 LAB — BRAIN NATRIURETIC PEPTIDE: B Natriuretic Peptide: 834.2 pg/mL — ABNORMAL HIGH (ref 0.0–100.0)

## 2021-11-11 LAB — PHOSPHORUS: Phosphorus: 2.9 mg/dL (ref 2.5–4.6)

## 2021-11-11 LAB — HEPARIN LEVEL (UNFRACTIONATED): Heparin Unfractionated: 0.26 IU/mL — ABNORMAL LOW (ref 0.30–0.70)

## 2021-11-11 LAB — MAGNESIUM: Magnesium: 1.9 mg/dL (ref 1.7–2.4)

## 2021-11-11 MED ORDER — MAGNESIUM SULFATE IN D5W 1-5 GM/100ML-% IV SOLN
1.0000 g | Freq: Once | INTRAVENOUS | Status: AC
  Administered 2021-11-11: 1 g via INTRAVENOUS
  Filled 2021-11-11: qty 100

## 2021-11-11 MED ORDER — LACTATED RINGERS IV BOLUS
1000.0000 mL | Freq: Once | INTRAVENOUS | Status: AC
Start: 2021-11-11 — End: 2021-11-11
  Administered 2021-11-11: 1000 mL via INTRAVENOUS

## 2021-11-11 MED ORDER — POTASSIUM CHLORIDE 20 MEQ PO PACK
40.0000 meq | PACK | Freq: Once | ORAL | Status: AC
Start: 2021-11-11 — End: 2021-11-11
  Administered 2021-11-11: 40 meq
  Filled 2021-11-11: qty 2

## 2021-11-11 MED ORDER — VITAL AF 1.2 CAL PO LIQD
1000.0000 mL | ORAL | Status: DC
  Administered 2021-11-11 – 2021-11-14 (×4): 1000 mL

## 2021-11-11 MED ORDER — HEPARIN SODIUM (PORCINE) 5000 UNIT/ML IJ SOLN
5000.0000 [IU] | Freq: Three times a day (TID) | INTRAMUSCULAR | Status: DC
  Administered 2021-11-11 – 2021-11-14 (×9): 5000 [IU] via SUBCUTANEOUS
  Filled 2021-11-11 (×9): qty 1

## 2021-11-11 MED ORDER — ADULT MULTIVITAMIN W/MINERALS CH
1.0000 | ORAL_TABLET | Freq: Every day | ORAL | Status: DC
  Administered 2021-11-11 – 2021-11-14 (×4): 1
  Filled 2021-11-11 (×4): qty 1

## 2021-11-11 NOTE — Progress Notes (Signed)
eLink Physician-Brief Progress Note Patient Name: [name] DOB: August 02, 1955 MRN: [id number]   Date of Service  11/11/2021  HPI/Events of Note  Multiple issues: 1. Urinary retention, 2. Hypokalemia - K+ = 3.7  and 3. Hypomagnesemia - Mg++ = 1.9. Creatinine = 0.70.  eICU Interventions  Plan: I/O Cath PRN. Replace K+ and Mg++.     Intervention Category Major Interventions: Electrolyte abnormality - evaluation and management;Other:  [name] 11/11/2021, 5:49 AM

## 2021-11-11 NOTE — Progress Notes (Signed)
OT Cancellation Note  Patient Details Name: [name] MRN: [id number] DOB: 1956-05-02   Cancelled Treatment:    Reason Eval/Treat Not Completed: Medical issues which prohibited therapy (Intubated and decreased arousal. Will return as schedule allows.)  Arisa Congleton M Latana Colin Tab Rylee MSOT, OTR/L Acute Rehab Office: [phone] 11/11/2021, 12:50 PM

## 2021-11-11 NOTE — Progress Notes (Signed)
Tried to reach patient's sister to give an update at both numbers without answer.  Darcella Gasman Yishai Rehfeld, PA-C

## 2021-11-11 NOTE — Progress Notes (Signed)
Initial Nutrition Assessment  DOCUMENTATION CODES:   Severe malnutrition in context of chronic illness  INTERVENTION:   Initiate tube feeding via OG tube: Vital AF 1.2 at 20 ml/h, increase by 10 ml every 8 hours to goal rate of 60 ml/h (1440 ml per day).  Provides 1728 kcal, 108 gm protein, 1168 ml free water daily.  MVI with minerals daily via tube.  Monitor magnesium, potassium, and phosphorus BID for at least 3 days, MD to replete as needed, as pt is at risk for refeeding syndrome given severe malnutrition.  NUTRITION DIAGNOSIS:   Severe Malnutrition related to chronic illness (COPD) as evidenced by severe muscle depletion, severe fat depletion.  GOAL:   Patient will meet greater than or equal to 90% of their needs  MONITOR:   Vent status, TF tolerance, Skin, Labs  REASON FOR ASSESSMENT:   Consult Enteral/tube feeding initiation and management  ASSESSMENT:   66 yo male admitted with TIA, bilateral ICA stenosis. PMH includes active smoker, HTN, HLD, CAD, COPD, bilateral carotid stenosis, seizures r/t TBI, OSA.  Patient transferred to [facility] on 5/28. Diet was advanced to dysphagia 3 with honey thick liquids. He choked on his dinner 5/28 and developed respiratory distress requiring intubation.  TTE on 5/28 showed possible MV mass/vegetation. He needs a TEE after stabilization.   Discussed patient in ICU rounds and with RN today. Plans to keep intubated today. Okay to begin TF, RD to order.  OG tube in place with tip in the distal stomach per chest x-ray 5/28. Patient is at risk for refeeding syndrome given severe malnutrition. Will start TF at a low rate and advance slowly to goal rate as well as monitoring refeeding labs.   Patient is currently intubated on ventilator support MV: 8.3 L/min Temp (24hrs), Avg:99.8 F (37.7 C), Min:98.2 F (36.8 C), Max:101 F (38.3 C)   Labs reviewed. Na 134 CBG: 117-140-89  Medications reviewed and include Colace, Novolog,  Miralax, mag sulfate, Precedex, fentanyl, Levophed.  Weight history reviewed. No significant weight changes noted. Patient meets criteria for severe malnutrition, given severe depletion of muscle and subcutaneous fat mass.  NUTRITION - FOCUSED PHYSICAL EXAM:  Flowsheet Row Most Recent Value  Orbital Region Severe depletion  Upper Arm Region Mild depletion  Thoracic and Lumbar Region Severe depletion  Buccal Region Moderate depletion  Temple Region Severe depletion  Clavicle Bone Region Severe depletion  Clavicle and Acromion Bone Region Severe depletion  Scapular Bone Region Severe depletion  Dorsal Hand No depletion  Patellar Region Severe depletion  Anterior Thigh Region Severe depletion  Posterior Calf Region Severe depletion  Edema (RD Assessment) None  Hair Reviewed  Eyes Unable to assess  Mouth Unable to assess  Skin Reviewed  Nails Reviewed       Diet Order:   Diet Order             Diet NPO time specified  Diet effective now                   EDUCATION NEEDS:   Not appropriate for education at this time  Skin:  Skin Assessment: Skin Integrity Issues: Skin Integrity Issues:: Stage II Stage II: sacrum  Last BM:  5/28  Height:   Ht Readings from Last 1 Encounters:  10/23/2021 5\' 7"  (1.702 m)    Weight:   Wt Readings from Last 1 Encounters:  11/11/21 55.1 kg     BMI:  Body mass index is 19.03 kg/m.  Estimated Nutritional Needs:  Kcal:  1650-1850  Protein:  80-100 gm  Fluid:  1.7-1.9 L    Lucas Mallow RD, LDN, CNSC Please refer to Amion for contact information.

## 2021-11-11 NOTE — Progress Notes (Signed)
PT Cancellation Note  Patient Details Name: [name] MRN: [id number] DOB: 12-24-1955   Cancelled Treatment:    Reason Eval/Treat Not Completed: Medical issues which prohibited therapy. Pt remains intubated and not currently following commands. Will continue to monitor for appropriateness.    Shary Decamp [facility] 11/11/2021, 9:57 AM [place] Office [phone]

## 2021-11-11 NOTE — Progress Notes (Addendum)
NAME:  [name], MRN:  [id number], DOB:  September 16, 1955, LOS: 3 ADMISSION DATE:  10/22/2021, CONSULTATION DATE:  11/09/21 REFERRING MD:  TRH CHIEF COMPLAINT:  Respiratory distress   History of Present Illness:  66 year old male active smoker with PMHx as noted below admitted for TIA secondary to bilateral ICA stenosis. Initially presened to [facility] with slurred speech and R>L sided weakness. Code stroke called. Also concerned for NSTEMI so transferred to [facility]. Per bedside RN and Neurology note today he has returned to baseline and he was able to follow commands with residual dysarthria.  Overnight he had choked on dinner. Bedside RN suctioned and some food removed. He was originally on room air and desatted requiring NRB and in distress. Previously able to follow commands and mainly dysarthric but now confused and in respiratory distress. CXR with pulmonary edema. Primary team contacted family and family reversed code status to full code. 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 Transferred to [facility] 5/28 - Returned to baseline during day except for dysarthria. Started on dysphagia diet  Interim History / Subjective:   Patient mains critically ill intubated on mechanical life support on vasopressors  Objective   Blood pressure (!) 83/55, pulse (!) 52, temperature (!) 101 F (38.3 C), temperature source Oral, resp. rate 16, height 5\' 7"  (1.702 m), weight 55.1 kg, SpO2 95 %.    Vent Mode: PRVC FiO2 (%):  [40 %] 40 % Set Rate:  [16 bmp] 16 bmp Vt Set:  [530 mL] 530 mL PEEP:  [5 cmH20] 5 cmH20 Plateau Pressure:  [16 cmH20-20 cmH20] 18 cmH20   Intake/Output Summary (Last 24 hours) at 11/11/2021 [id number] Last data filed at 11/11/2021 0800 Gross  per 24 hour  Intake 2050.6 ml  Output 1000 ml  Net 1050.6 ml    Filed Weights   11/09/21 2250 11/10/21 0456 11/11/21 0500  Weight: 53.3 kg 53.3 kg 55.1 kg    General:  very thin, elderly M, intubated and sedated HEENT: MM pink/moist, ETT in place, pupils equal, sclera anicteric Neuro: initially examined on fentanyl, RASS -5, on precedex overnight CV: s1s2 rrr, no m/r/g PULM:  mechanical vent sounds bilaterally with decreased air entry bilateral bases GI: soft, bsx4 active  Extremities: warm/dry, no edema  Skin: no rashes or lesions   Labs reviewed WBC 12 BNP  834  Resolved Hospital Problem list    Nosebleed following NT suctioning on heparin Assessment & Plan:   Acute hypoxemic respiratory failure secondary to aspiration, thought to choke on chicken Pulmonary edema Acute encephalopathy secondary to above in setting of critical illness Apparently more agitated yesterday evening and last night, on high dose Fentanyl/precedex and very sedated this morning -stopping all sedation this AM, see if will tolerate SBT --Maintain full vent support with SAT/SBT as tolerated -titrate Vent setting to maintain SpO2 greater than or equal to 90%. -HOB elevated 30 degrees. -Plateau pressures less than 30 cm H20.  -Follow chest x-ray, ABG prn.   -Bronchial hygiene and RT/bronchodilator protocol. -respiratory culture pending -continue Unasyn   Septic shock SIRS response from aspiration and choking on chicken -Continue norepinephrine infusion, goal SBP 130-160 per neuro -discussed with cardiology, appearance of MV mass would be atypical for emboli, low suspicion for endocarditis.  Will follow blood cultures.  If were to result positive then consider empiric treatment and further discussion of risk/benefit of TEE  TIA likely secondary to Bilateral carotid stenosis HTN Appreciate vascular surgery input -SBP goal 130-160 -seen by vascular surgery, thought likely not a good candidate for  vascular procedure at this time  -continue Asa and Plavix   NSTEMI CAD HLD MV vegetation vs mass Plan: -Appreciate cardiology input -TEE once stabilized  -Continue dual antiplatelet therapy, heparin infusion stopped after discussion with neuro and cards -Statin  OSA COPD On CPAP at home -Remains intubated at this time  Hx TBI with associated seizure EEG This study is suggestive of cortical dysfunction arising from left temporal region as well as mild diffuse encephalopathy, nonspecific etiology.  No seizures or epileptiform discharges -Supportive care -seizure hx remote, not on anti-epileptics  Anxiety Plan: -SSRI     Best Practice (right click and "Reselect all SmartList Selections" daily)   Diet/type: NPO DVT prophylaxis: systemic heparin GI prophylaxis: PPI Lines: N/A Foley:  N/A Code Status:  full code Family reversed DNR prior to transfer Last date of multidisciplinary goals of care discussion [5/28 with [facility]]  Will attempt to update family today  Labs   CBC: Recent Labs  Lab 10/15/2021 1149 10/14/2021 1157 10/28/2021 1445 11/09/21 0624 11/10/21 0013 11/10/21 0133 11/11/21 0254  WBC 14.5*  --  14.3* 11.8*  --  17.5* 12.7*  NEUTROABS 11.9*  --   --   --   --  14.9* 9.4*  HGB 12.6*   < > 11.8* 10.9* 10.9* 10.9* 10.6*  HCT 38.3*   < > 36.1* 33.7* 32.0* 32.2* 31.5*  MCV 101.6*  --  102.0* 101.5*  --  100.6* 101.6*  PLT 209  --  199 189  --  204 171   < > = values in this interval not displayed.     Basic Metabolic Panel: Recent Labs  Lab 11/01/2021 1149 10/13/2021 1157 11/09/21 0624 11/10/21 0013 11/10/21 0133 11/10/21 2248 11/11/21 0254  NA 136 136 136 136 136 137 134*  K 4.2 4.2 3.7 4.3 4.0 3.8 3.7  CL 102 98 103  --  106 106 103  CO2 29  --  23  --  23 25 24   GLUCOSE 114* 112* 88  --  189* 127* 128*  BUN 21 21 17   --  18 17 17   CREATININE 0.70 0.60* 0.80  --  0.97 0.69 0.70  CALCIUM 9.2  --  8.5*  --  7.8* 7.9* 7.9*  MG  --   --  2.0  --  2.1  1.9 1.9    GFR: Estimated Creatinine Clearance: 69.8 mL/min (by C-G formula based on SCr of 0.7 mg/dL). Recent Labs  Lab 10/30/2021 1445 11/09/21 0624 11/10/21 0133 11/11/21 0254  WBC 14.3* 11.8* 17.5* 12.7*  LATICACIDVEN  --   --  1.1  --      Liver Function Tests: Recent Labs  Lab 11/07/2021 1149 11/09/21 0624 11/10/21 0133 11/11/21 0254  AST 32 29 44* 39  ALT 22 19 22 22   ALKPHOS 69 56 62 62  BILITOT 1.0 1.4* 1.0 0.8  PROT 7.2 5.7* 5.4* 5.2*  ALBUMIN 3.9 2.9* 2.5* 2.3*    No results for input(s): LIPASE, AMYLASE in the last 168 hours. No results for input(s): AMMONIA in the last 168 hours.  ABG    Component Value Date/Time   PHART 7.362 11/10/2021 0013   PCO2ART 50.6 (H) 11/10/2021 0013   PO2ART  378 (H) 11/10/2021 0013   HCO3 28.8 (H) 11/10/2021 0013   TCO2 30 11/10/2021 0013   ACIDBASEDEF 0.3 01/04/2008 0320   O2SAT 100 11/10/2021 0013      Coagulation Profile: Recent Labs  Lab 11/09/2021 1149  INR 1.1     Cardiac Enzymes: No results for input(s): CKTOTAL, CKMB, CKMBINDEX, TROPONINI in the last 168 hours.  HbA1C: Hgb A1c MFr Bld  Date/Time Value Ref Range Status  11/09/2021 06:24 AM 6.2 (H) 4.8 - 5.6 % Final    Comment:    (NOTE) Pre diabetes:          5.7%-6.4%  Diabetes:              >6.4%  Glycemic control for   <7.0% adults with diabetes   02/03/2020 07:34 AM 6.1 (H) 4.8 - 5.6 % Final    Comment:    (NOTE) Pre diabetes:          5.7%-6.4%  Diabetes:              >6.4%  Glycemic control for   <7.0% adults with diabetes     CBG: Recent Labs  Lab 11/10/21 1520 11/10/21 1946 11/10/21 2326 11/11/21 0308 11/11/21 0732  GLUCAP 104* 118* 115* 117* 140*     Review of Systems:   Unable to obtain due to critical illness  Past Medical History:  He,  has a past medical history of Anxiety, Carotid artery disease ([place]), Cerebral aneurysm, Closed head injury, COPD (chronic obstructive pulmonary disease) ([place]), Depression, GERD  (gastroesophageal reflux disease), Hyperlipidemia, Hypertension, Seizures ([place]), and Sleep apnea.   Surgical History:  History reviewed. No pertinent surgical history.   Social History:   reports that he has been smoking cigarettes. He has a 50.00 pack-year smoking history. He has never used smokeless tobacco. He reports current drug use. Drug: Marijuana. He reports that he does not drink alcohol.   Family History:  His family history includes Obesity in his sister.   Allergies Allergies  Allergen Reactions   Codeine Rash     Home Medications  Prior to Admission medications   Medication Sig Start Date End Date Taking? Authorizing Provider  albuterol (VENTOLIN HFA) 108 (90 Base) MCG/ACT inhaler 2 PUFFS EVERY 6 HOURS AS NEEDED FOR WHEEZING OR SHORTNESS OF BREATH Patient taking differently: Inhale 2 puffs into the lungs every 6 (six) hours as needed for wheezing or shortness of breath. 11/20/19  Yes Dettinger, Fransisca Kaufmann, MD  aspirin 325 MG tablet Take 325 mg by mouth daily.   Yes [provider]  busPIRone (BUSPAR) 10 MG tablet TAKE ONE TABLET THREE TIMES DAILY Patient taking differently: Take 10 mg by mouth 3 (three) times daily. 08/26/21  Yes Dettinger, Fransisca Kaufmann, MD  ipratropium-albuterol (DUONEB) 0.5-2.5 (3) MG/3ML SOLN INHALE 3 MLS BY NEBULIZER 4 TIMES A DAY Patient taking differently: Take 3 mLs by nebulization every 6 (six) hours as needed (wheezing/shortness of breath). 09/22/21  Yes Dettinger, Fransisca Kaufmann, MD  mupirocin ointment (BACTROBAN) 2 % PLACE 1 APPLICATION INTO THE NOSE 2 TIMES A DAY Patient taking differently: Apply 1 application. topically 2 (two) times daily. PLACE 1 APPLICATION INTO THE NOSE 2 TIMES A DAY 03/13/20  Yes Dettinger, Fransisca Kaufmann, MD  MYRBETRIQ 25 MG TB24 tablet TAKE ONE TABLET ONCE DAILY Patient taking differently: Take 25 mg by mouth daily. 10/27/21  Yes Dettinger, Fransisca Kaufmann, MD  niacin (NIASPAN) 1000 MG CR tablet TAKE 1 TABLET BY MOUTH AT BEDTIME. Patient  taking differently:  Take 1,000 mg by mouth at bedtime. 08/26/21  Yes Dettinger, Fransisca Kaufmann, MD  Nystatin (GERHARDT'S BUTT CREAM) CREA Apply 1 application topically daily. Equal parts nystatin and hydrocortisone and zinc oxide 03/15/20  Yes Dettinger, Fransisca Kaufmann, MD  omeprazole (PRILOSEC) 40 MG capsule TAKE (1) CAPSULE DAILY Patient taking differently: Take 40 mg by mouth daily. 08/26/21  Yes Dettinger, Fransisca Kaufmann, MD  PARoxetine (PAXIL) 20 MG tablet TAKE ONE TABLET ONCE DAILY Patient taking differently: Take 20 mg by mouth daily. 08/26/21  Yes Dettinger, Fransisca Kaufmann, MD  Potassium 99 MG TABS Take 1 tablet by mouth daily.   Yes [provider]  pramipexole (MIRAPEX) 0.5 MG tablet Take 2 tablets (1 mg total) by mouth at bedtime. 09/19/20  Yes Dettinger, Fransisca Kaufmann, MD  risperiDONE (RISPERDAL) 2 MG tablet Take 1 tablet (2 mg total) by mouth at bedtime. 09/19/20  Yes Dettinger, Fransisca Kaufmann, MD  simvastatin (ZOCOR) 20 MG tablet TAKE ONE TABLET AT BEDTIME Patient taking differently: Take 20 mg by mouth daily at 6 PM. 08/26/21  Yes Dettinger, Fransisca Kaufmann, MD  thiamine 100 MG tablet Take 1 tablet (100 mg total) by mouth daily. 12/02/19  Yes Roxan Hockey, MD     CRITICAL CARE Performed by: Otilio Carpen Nataliah Hatlestad   Total critical care time: 40 minutes  Critical care time was exclusive of separately billable procedures and treating other patients.  Critical care was necessary to treat or prevent imminent or life-threatening deterioration.  Critical care was time spent personally by me on the following activities: development of treatment plan with patient and/or surrogate as well as nursing, discussions with consultants, evaluation of patient's response to treatment, examination of patient, obtaining history from patient or surrogate, ordering and performing treatments and interventions, ordering and review of laboratory studies, ordering and review of radiographic studies, pulse oximetry and re-evaluation of patient's  condition.  Otilio Carpen Sharbel Sahagun, PA-C [facility] See Amion for pager If no response to pager , please call [phone] until 7pm After 7:00 pm call Elink  [phone]?[place]

## 2021-11-11 NOTE — Progress Notes (Signed)
STROKE TEAM PROGRESS NOTE   SUBJECTIVE (INTERVAL HISTORY) No family is at the bedside. Pt still on vent. Per RN, pt was agitated earlier today and has to be sedated. On my exam, he was not open eyes or moving extremities.    OBJECTIVE Temp:  [98.2 F (36.8 C)-101 F (38.3 C)] 99.8 F (37.7 C) (05/30 1116) Pulse Rate:  [49-138] 76 (05/30 1200) Cardiac Rhythm: Normal sinus rhythm (05/30 1200) Resp:  [10-28] 10 (05/30 1200) BP: (77-107)/(52-66) 84/55 (05/30 1200) SpO2:  [88 %-100 %] 92 % (05/30 1200) Arterial Line BP: (115-158)/(39-55) 148/46 (05/30 1200) FiO2 (%):  [40 %] 40 % (05/30 1200) Weight:  [55.1 kg] 55.1 kg (05/30 0500)  Recent Labs  Lab 11/10/21 1946 11/10/21 2326 11/11/21 0308 11/11/21 0732 11/11/21 1115  GLUCAP 118* 115* 117* 140* 89   Recent Labs  Lab 10/22/2021 1149 10/27/2021 1157 11/09/21 0624 11/10/21 0013 11/10/21 0133 11/10/21 2248 11/11/21 0254  NA 136 136 136 136 136 137 134*  K 4.2 4.2 3.7 4.3 4.0 3.8 3.7  CL 102 98 103  --  106 106 103  CO2 29  --  23  --  23 25 24   GLUCOSE 114* 112* 88  --  189* 127* 128*  BUN 21 21 17   --  18 17 17   CREATININE 0.70 0.60* 0.80  --  0.97 0.69 0.70  CALCIUM 9.2  --  8.5*  --  7.8* 7.9* 7.9*  MG  --   --  2.0  --  2.1 1.9 1.9   Recent Labs  Lab 10/22/2021 1149 11/09/21 0624 11/10/21 0133 11/11/21 0254  AST 32 29 44* 39  ALT 22 19 22 22   ALKPHOS 69 56 62 62  BILITOT 1.0 1.4* 1.0 0.8  PROT 7.2 5.7* 5.4* 5.2*  ALBUMIN 3.9 2.9* 2.5* 2.3*   Recent Labs  Lab 10/26/2021 1149 10/24/2021 1157 11/02/2021 1445 11/09/21 0624 11/10/21 0013 11/10/21 0133 11/11/21 0254  WBC 14.5*  --  14.3* 11.8*  --  17.5* 12.7*  NEUTROABS 11.9*  --   --   --   --  14.9* 9.4*  HGB 12.6*   < > 11.8* 10.9* 10.9* 10.9* 10.6*  HCT 38.3*   < > 36.1* 33.7* 32.0* 32.2* 31.5*  MCV 101.6*  --  102.0* 101.5*  --  100.6* 101.6*  PLT 209  --  199 189  --  204 171   < > = values in this interval not displayed.   No results for input(s):  CKTOTAL, CKMB, CKMBINDEX, TROPONINI in the last 168 hours. No results for input(s): LABPROT, INR in the last 72 hours.  Recent Labs    10/18/2021 1608  COLORURINE YELLOW  LABSPEC 1.025  PHURINE 5.0  GLUCOSEU NEGATIVE  HGBUR SMALL*  BILIRUBINUR NEGATIVE  KETONESUR 20*  PROTEINUR 30*  NITRITE NEGATIVE  LEUKOCYTESUR NEGATIVE       Component Value Date/Time   CHOL 127 10/21/2021 1148   CHOL 125 10/15/2021 1443   TRIG 34 10/21/2021 1148   TRIG 101 10/03/2013 1654   HDL 64 10/23/2021 1148   HDL 63 10/15/2021 1443   HDL 64 10/03/2013 1654   CHOLHDL 2.0 10/25/2021 1148   VLDL 7 10/27/2021 1148   LDLCALC 56 10/22/2021 1148   LDLCALC 52 10/15/2021 1443   LDLCALC 45 10/03/2013 1654   Lab Results  Component Value Date   HGBA1C 6.2 (H) 11/09/2021      Component Value Date/Time   LABOPIA NONE DETECTED 10/24/2021 1608  COCAINSCRNUR NONE DETECTED 11/03/2021 1608   LABBENZ NONE DETECTED 10/27/2021 1608   AMPHETMU NONE DETECTED 10/26/2021 1608   THCU NONE DETECTED 11/03/2021 1608   LABBARB NONE DETECTED 10/27/2021 1608    Recent Labs  Lab 10/22/2021 1149  ETH <10    I have personally reviewed the radiological images below and agree with the radiology interpretations.  CT ANGIO HEAD NECK W WO CM  Result Date: 11/09/2021 CLINICAL DATA:  Stroke workup EXAM: CT ANGIOGRAPHY HEAD AND NECK TECHNIQUE: Multidetector CT imaging of the head and neck was performed using the standard protocol during bolus administration of intravenous contrast. Multiplanar CT image reconstructions and MIPs were obtained to evaluate the vascular anatomy. Carotid stenosis measurements (when applicable) are obtained utilizing NASCET criteria, using the distal internal carotid diameter as the denominator. RADIATION DOSE REDUCTION: This exam was performed according to the departmental dose-optimization program which includes automated exposure control, adjustment of the mA and/or kV according to patient size and/or  use of iterative reconstruction technique. CONTRAST:  167mL OMNIPAQUE IOHEXOL 350 MG/ML SOLN COMPARISON:  Stroke MRI from yesterday FINDINGS: CT HEAD FINDINGS Brain: No evidence of acute infarction, hemorrhage, hydrocephalus, extra-axial collection or mass lesion/mass effect. Chronic small vessel ischemia in the deep white matter with chronic lacunar/perforator infarcts at the left deep gray nuclei and right caudate head. Generalized cerebral volume loss. Vascular: No hyperdense vessel or unexpected calcification. Skull: Remote left-sided burr holes.  No acute or aggressive finding Sinuses: Extensive bilateral sinus opacification at the level of the maxillary and asymmetric left ethmoid sinuses. Orbits: No acute finding Review of the MIP images confirms the above findings CTA NECK FINDINGS Aortic arch: Minimal coverage, the brachiocephalic artery origin is not seen. Atherosclerosis. Right carotid system: Diffuse calcified plaque with severe bulky plaque at the bifurcation completely obscuring a patent lumen. The downstream right ICA is under filled compared to the left. Left carotid system: Calcified plaque primarily at the common carotid origin and bifurcation. A mixed density plaque at the left ICA origin has a web-like component causing over 70% stenosis. The left ECA origin is occluded with downstream reconstitution. Vertebral arteries: Calcified plaque involving both proximal subclavian arteries. Proximal right subclavian stenosis measures 60%. Calcified plaque at both V1 segments with high-grade narrowing on the left. The right vertebral artery is dominant. No dissection or beading Skeleton: Generalized cervical spine degeneration which is advanced. Other neck: No acute finding Upper chest: Severe emphysema at the apices. Review of the MIP images confirms the above findings CTA HEAD FINDINGS Anterior circulation: Extensive calcified plaque along the bilateral carotid siphons to the degree that calcified plaque  over estimates the diffuse narrowed appearance. Mild to moderate bilateral ICA stenosis is possible. No major branch occlusion, beading, or aneurysm. Bilateral atheromatous narrowing of MCA branches with moderate left M2 and right M1 stenosis see seen on coronal thick MIPS. Narrow left A1 segment which is likely both acquired and congenital. Posterior circulation: The vertebral and basilar arteries are smoothly contoured and diffusely patent. No branch occlusion, beading, or aneurysm. Venous sinuses: Unremarkable Anatomic variants: None significant Review of the MIP images confirms the above findings IMPRESSION: 1. Severe bilateral proximal ICA stenosis due to advanced atherosclerosis. On the right a measurable channel is not visible and there is downstream underfilling. 2. 60% narrowing at the right proximal subclavian artery. The brachiocephalic origin was not covered on the scan. 3. Advanced narrowing at the origin of the non dominant left vertebral artery. 4. Moderate left M2 and right M1 segment stenoses.  5. Aortic Atherosclerosis (ICD10-I70.0) and Emphysema (ICD10-J43.9). Electronically Signed   By: Jorje Guild M.D.   On: 11/09/2021 11:47   MR BRAIN WO CONTRAST  Result Date: 11/04/2021 CLINICAL DATA:  Slurred speech bilateral weakness EXAM: MRI HEAD WITHOUT CONTRAST TECHNIQUE: Multiplanar, multiecho pulse sequences of the brain and surrounding structures were obtained without intravenous contrast. COMPARISON:  02/03/2020, correlation is also made with CT head 11/12/2021 FINDINGS: Evaluation is somewhat limited by motion artifact. Brain: No restricted diffusion to suggest acute or subacute infarct. No acute hemorrhage, mass, mass effect, or midline shift. No hydrocephalus or extra-axial collection. Cerebral volume loss is advanced for age. Confluent T2 hyperintense signal in the periventricular white matter and pons, likely the sequela of moderate to severe chronic small vessel ischemic disease.  Redemonstrated lacunar infarcts in the thalami and basal ganglia. Redemonstrated hemosiderin deposition in the left midbrain, likely sequela of prior hypertensive microhemorrhage. Vascular: Normal arterial flow voids. Skull and upper cervical spine: Normal marrow signal. Sinuses/Orbits: Chronic bilateral maxillary sinusitis. Mucosal thickening in the ethmoid air cells. No acute finding in the orbits. Other: Trace fluid in left mastoid tip. IMPRESSION: No acute intracranial process. No evidence of acute or subacute infarct. Electronically Signed   By: Merilyn Baba M.D.   On: 11/11/2021 21:21   DG CHEST PORT 1 VIEW  Result Date: 11/10/2021 CLINICAL DATA:  Central line placement. EXAM: PORTABLE CHEST 1 VIEW COMPARISON:  Chest x-ray 11/09/2021. FINDINGS: Endotracheal tube tip is 5.4 cm above the carina. Enteric tube extends below the level of the diaphragm. Left sided central venous catheter tip projects over the SVC. The cardiomediastinal silhouette is stable and within normal limits. There are hazy patchy opacities in the left mid and lower lung, unchanged. There is central pulmonary vascular congestion, unchanged. There is no pleural effusion or pneumothorax visualized. IMPRESSION: 1. New left-sided central venous catheter tip projects over the SVC. 2. Stable left mid and lower lung airspace disease with central pulmonary vascular congestion. Electronically Signed   By: Ronney Asters M.D.   On: 11/10/2021 01:25   DG CHEST PORT 1 VIEW  Result Date: 11/09/2021 CLINICAL DATA:  Intubation, NG tube placement. EXAM: PORTABLE CHEST 1 VIEW COMPARISON:  11/09/2021. FINDINGS: The heart size and mediastinal contours are stable. The pulmonary vasculature is distended. Atherosclerotic calcification of the aorta is noted. Interstitial prominence and patchy perihilar airspace disease is noted bilaterally, increased from the prior exam. No effusion or pneumothorax. The endotracheal tube terminates 5.2 cm above the carina.  There is gaseous distention of the stomach with an enteric tube terminating in the distal stomach. No acute osseous abnormality. IMPRESSION: 1. Mildly distended pulmonary vasculature. 2. Interstitial prominence bilaterally with patchy perihilar airspace disease, increased from the prior exam, possible edema or infiltrate. 3. Support apparatus as detailed above. Electronically Signed   By: Brett Fairy M.D.   On: 11/09/2021 22:31   DG CHEST PORT 1 VIEW  Result Date: 11/09/2021 CLINICAL DATA:  Respiratory distress. EXAM: PORTABLE CHEST 1 VIEW COMPARISON:  11/09/2021. FINDINGS: The heart size and mediastinal contours are stable. The pulmonary vasculature is mildly distended. Increased interstitial and perihilar opacities are noted bilaterally. No effusion or pneumothorax. No acute osseous abnormality. IMPRESSION: 1. Mildly distended pulmonary vasculature. 2. Increased interstitial and perihilar airspace opacities suggesting edema, less likely infiltrate. Electronically Signed   By: Brett Fairy M.D.   On: 11/09/2021 20:51   DG Chest Port 1 View  Result Date: 11/09/2021 CLINICAL DATA:  Shortness of breath. EXAM: PORTABLE CHEST 1  VIEW COMPARISON:  12/03/2019 FINDINGS: 0558 hours. Rotated film. The lungs are clear without focal pneumonia, edema, pneumothorax or pleural effusion. Interstitial markings are diffusely coarsened with chronic features. Cardiopericardial silhouette is at upper limits of normal for size. Bones are diffusely demineralized. Telemetry leads overlie the chest. IMPRESSION: Chronic interstitial coarsening. No acute cardiopulmonary findings. Electronically Signed   By: Misty Stanley M.D.   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  EEG to evaluate for seizure. Level of alertness: Awake, asleep AEDs during EEG study: None Technical aspects: This EEG study was done with scalp electrodes positioned according to the 10-20 International system of electrode placement. Electrical activity was acquired at a sampling rate of 500Hz  and reviewed with a high frequency filter of 70Hz  and a low frequency filter of 1Hz . EEG data were recorded continuously and digitally stored. Description: The posterior dominant rhythm consists of 8 Hz activity of moderate voltage (25-35 uV) seen predominantly in posterior head regions, symmetric and reactive to eye opening and eye closing. Sleep was characterized by vertex waves, sleep spindles (12 to 14 Hz), maximal frontocentral region. EEG showed intermittent generalized and maximal left temporal region 3 to 6 Hz theta-delta slowing. Hyperventilation and photic stimulation were not performed.   ABNORMALITY - Intermittent slow, generalized and maximal left temporal region IMPRESSION: This study is suggestive of cortical dysfunction arising from left temporal region as well as mild diffuse encephalopathy, nonspecific etiology.  No seizures or epileptiform discharges were seen throughout the recording. Lora Havens   ECHOCARDIOGRAM COMPLETE  Result Date: 11/09/2021    ECHOCARDIOGRAM REPORT   Patient Name:   [name] Date of Exam: 11/09/2021 Medical Rec #:  [id number]           Height:       67.0 in Accession #:    [id number]          Weight:       126.3 lb Date of Birth:  May 19, 1956           BSA:          1.664 m Patient Age:    29 years            BP:           119/53 mmHg Patient Gender: M                   HR:           79 bpm. Exam Location:  Inpatient Procedure: 2D Echo, Cardiac Doppler and Color Doppler Indications:    Stroke  History:        Patient has prior history of Echocardiogram examinations, most                  recent 02/03/2020. CAD, TIA and COPD; Risk Factors:Current                 Smoker, Hypertension and Dyslipidemia.  Sonographer:    Clayton Lefort RDCS (AE) Referring Phys: [id number] Margaree Mackintosh [place]  1. Left ventricular ejection fraction, by estimation, is 60 to 65%. The left ventricle has normal function. The left ventricle has no regional wall motion abnormalities. Left  ventricular diastolic parameters were normal.  2. Right ventricular systolic function is normal. The right ventricular size is normal.  3. Only in parasternal long axis views ? mass on atrial surface of posterior leaflet may be shadowing artifact but in setting of TIA consider f/u TEE if clinically indicated . The mitral valve is abnormal. Trivial mitral valve regurgitation. No evidence  of mitral stenosis.  4. The aortic valve is tricuspid. There is mild calcification of the aortic valve. There is mild thickening of the aortic valve. Aortic valve regurgitation is not visualized. Aortic valve sclerosis is present, with no evidence of aortic valve stenosis.  5. The inferior vena cava is dilated in size with >50% respiratory variability, suggesting right atrial pressure of 8 mmHg. FINDINGS  Left Ventricle: Left ventricular ejection fraction, by estimation, is 60 to 65%. The left ventricle has normal function. The left ventricle has no regional wall motion abnormalities. The left ventricular internal cavity size was normal in size. There is  no left ventricular hypertrophy. Left ventricular diastolic parameters were normal. Right Ventricle: The right ventricular size is normal. No increase in right ventricular wall thickness. Right ventricular systolic function is normal. Left Atrium: Left atrial size was normal in size. Right Atrium: Right atrial size was normal in size. Pericardium: There is no evidence of pericardial effusion. Mitral Valve: Only in parasternal long axis views ? mass on atrial surface of posterior leaflet may be shadowing  artifact but in setting of TIA consider f/u TEE if clinically indicated. The mitral valve is abnormal. There is mild thickening of the mitral  valve leaflet(s). Trivial mitral valve regurgitation. No evidence of mitral valve stenosis. Tricuspid Valve: The tricuspid valve is normal in structure. Tricuspid valve regurgitation is not demonstrated. No evidence of tricuspid stenosis. Aortic Valve: The aortic valve is tricuspid. There is mild calcification of the aortic valve. There is mild thickening of the aortic valve. Aortic valve regurgitation is not visualized. Aortic valve sclerosis is present, with no evidence of aortic valve stenosis. Aortic valve mean gradient measures 2.0 mmHg. Aortic valve peak gradient measures 4.2 mmHg. Aortic valve area, by VTI measures 3.33 cm. Pulmonic Valve: The pulmonic valve was normal in structure. Pulmonic valve regurgitation is not visualized. No evidence of pulmonic stenosis. Aorta: The aortic root is normal in size and structure. Venous: The inferior vena cava is dilated in size with greater than 50% respiratory variability, suggesting right atrial pressure of 8 mmHg. IAS/Shunts: No atrial level shunt detected by color flow Doppler.  LEFT VENTRICLE PLAX 2D LVIDd:         5.10 cm   Diastology LVIDs:         3.80 cm   LV e' medial:    7.83 cm/s LV PW:         0.90 cm   LV E/e' medial:  12.5 LV IVS:        1.20 cm   LV e' lateral:   8.16 cm/s LVOT diam:     2.20 cm   LV E/e' lateral: 12.0 LV SV:         62 LV SV Index:   37 LVOT Area:     3.80 cm  RIGHT VENTRICLE             IVC RV Basal diam:  2.90 cm     IVC diam: 2.60 cm RV S prime:     12.50 cm/s TAPSE (M-mode): 2.2 cm LEFT ATRIUM  Index        RIGHT ATRIUM           Index LA diam:        3.20 cm 1.92 cm/m   RA Area:     18.40 cm LA Vol (A2C):   49.8 ml 29.94 ml/m  RA Volume:   43.60 ml  26.21 ml/m LA Vol (A4C):   52.7 ml 31.68 ml/m LA Biplane Vol: 54.1 ml 32.52 ml/m  AORTIC VALVE AV Area (Vmax):    3.54 cm AV  Area (Vmean):   3.37 cm AV Area (VTI):     3.33 cm AV Vmax:           102.00 cm/s AV Vmean:          65.600 cm/s AV VTI:            0.186 m AV Peak Grad:      4.2 mmHg AV Mean Grad:      2.0 mmHg LVOT Vmax:         94.90 cm/s LVOT Vmean:        58.200 cm/s LVOT VTI:          0.163 m LVOT/AV VTI ratio: 0.88  AORTA Ao Root diam: 3.20 cm Ao Asc diam:  2.70 cm MITRAL VALVE MV Area (PHT): 5.62 cm    SHUNTS MV Decel Time: 135 msec    Systemic VTI:  0.16 m MV E velocity: 98.00 cm/s  Systemic Diam: 2.20 cm MV A velocity: 56.10 cm/s MV E/A ratio:  1.75 Jenkins Rouge MD Electronically signed by Jenkins Rouge MD Signature Date/Time: 11/09/2021/2:11:08 PM    Final    CT HEAD CODE STROKE WO CONTRAST  Result Date: 10/24/2021 CLINICAL DATA:  Code stroke. Stroke suspected. Right-sided weakness for 23 hours EXAM: CT HEAD WITHOUT CONTRAST TECHNIQUE: Contiguous axial images were obtained from the base of the skull through the vertex without intravenous contrast. RADIATION DOSE REDUCTION: This exam was performed according to the departmental dose-optimization program which includes automated exposure control, adjustment of the mA and/or kV according to patient size and/or use of iterative reconstruction technique. COMPARISON:  09/14/2021 FINDINGS: Brain: No evidence of acute infarction, hemorrhage, hydrocephalus, extra-axial collection or mass lesion/mass effect. Multiple chronic lacunar infarcts at the bilateral deep gray nuclei, especially the bilateral thalamus. 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 Premature chronic small vessel ischemia. Confluence clinic gliosis in the cerebral white matter. Vascular: No hyperdense vessel or unexpected calcification. Skull: Normal. Negative for fracture or focal lesion. Prior burr holes on the left. Sinuses/Orbits: Chronic bilateral maxillary sinusitis with left more than right opacification and sclerotic wall thickening. ASPECTS [facility] Stroke Program Early CT  Score) - Ganglionic level infarction (caudate, lentiform nuclei, internal capsule, insula, M1-M3 cortex): 7 - Supraganglionic infarction (M4-M6 cortex): 3 Total score (0-10 with 10 being normal): 10 IMPRESSION: 1. No acute finding. 2. Advanced chronic small vessel ischemia. Generalized cerebral atrophy. 3. Chronic sinusitis. Electronically Signed   By: Jorje Guild M.D.   On: 10/19/2021 12:04   VAS US CAROTID  Result Date: 11/10/2021 Carotid Arterial Duplex Study Patient Name:  DOMANICK GRANIERI  Date of Exam:   11/09/2021 Medical Rec #: [id number]            Accession #:    [id number] Date of Birth: 1955/11/07            Patient Gender: M Patient Age:   50 years Exam Location:  [facility] Procedure:  VAS US CAROTID Referring Phys: Servando Snare --------------------------------------------------------------------------------  Indications:       CVA, Speech disturbance and Weakness. NSTEMI. Risk Factors:      Hypertension, hyperlipidemia, coronary artery disease, prior                    CVA and TBI. Other Factors:     CTA of neck done 11/09/21 indicated "Severe bilateral proximal                    ICA stenosis due to advanced atherosclerosis. On the right a                    measurable channel is not visible and there is downstream                    underfilling." Limitations        Today's exam was limited due to heavy calcification and the                    resulting shadowing and coughing and shaking of legs. Comparison Study:  Prior carotid duplex [facility] 11/14/19 indicated                    R:80-99% ICA stenosis, highest end of range and L:60-79% ICA                    stenosis, highest end of range Performing Technologist: Sharion Dove RVS  Examination Guidelines: A complete evaluation includes B-mode imaging, spectral Doppler, color Doppler, and power Doppler as needed of all accessible portions of each vessel. Bilateral testing is considered an integral part of a complete  examination. Limited examinations for reoccurring indications may be performed as noted.  Right Carotid Findings: +----------+--------+--------+--------+----------------------+---------+           PSV cm/sEDV cm/sStenosisPlaque Description    Comments  +----------+--------+--------+--------+----------------------+---------+ CCA Prox  50      9               calcific              Shadowing +----------+--------+--------+--------+----------------------+---------+ CCA Distal24      6               calcific              Shadowing +----------+--------+--------+--------+----------------------+---------+ ICA Prox  453     74              calcific and irregularShadowing +----------+--------+--------+--------+----------------------+---------+ ECA       95      11                                              +----------+--------+--------+--------+----------------------+---------+ +----------+--------+-------+--------+-------------------+           PSV cm/sEDV cmsDescribeArm Pressure (mmHG) +----------+--------+-------+--------+-------------------+ Subclavian110                                        +----------+--------+-------+--------+-------------------+ +---------+--------+---+--------+--+ VertebralPSV cm/s214EDV cm/s49 +---------+--------+---+--------+--+  Left Carotid Findings: +----------+-------+--------+--------+--------------------+--------------------+           PSV    EDV cm/sStenosisPlaque Description  Comments  cm/s                                                            +----------+-------+--------+--------+--------------------+--------------------+ CCA Prox  135    27              heterogenous                             +----------+-------+--------+--------+--------------------+--------------------+ CCA Distal126    21              heterogenous                              +----------+-------+--------+--------+--------------------+--------------------+ ICA Prox  581    132     80-99%  calcific and                                                              irregular                                +----------+-------+--------+--------+--------------------+--------------------+ ICA Mid   196    47                                                       +----------+-------+--------+--------+--------------------+--------------------+ ICA Distal81     20                                                       +----------+-------+--------+--------+--------------------+--------------------+ ECA       263    35                                  mid to distal                                                             portion              +----------+-------+--------+--------+--------------------+--------------------+ +----------+--------+--------+--------+-------------------+           PSV cm/sEDV cm/sDescribeArm Pressure (mmHG) +----------+--------+--------+--------+-------------------+ [id number]                                          +----------+--------+--------+--------+-------------------+ +---------+--------+--+--------+-+ VertebralPSV cm/s21EDV cm/s8 +---------+--------+--+--------+-+   Summary: Right Carotid: Near occlusion of the proximal right ICA. Left Carotid:  Velocities in the left ICA are consistent with a 80-99% stenosis. Vertebrals:  Bilateral vertebral arteries demonstrate antegrade flow. Subclavians: Normal flow hemodynamics were seen in bilateral subclavian              arteries. *See table(s) above for measurements and observations.  Electronically signed by Servando Snare MD on 11/10/2021 at 10:56:26 AM.    Final      PHYSICAL EXAM  Temp:  [98.2 F (36.8 C)-101 F (38.3 C)] 99.8 F (37.7 C) (05/30 1116) Pulse Rate:  [49-138] 76 (05/30 1200) Resp:  [10-28] 10 (05/30 1200) BP: (77-107)/(52-66) 84/55 (05/30  1200) SpO2:  [88 %-100 %] 92 % (05/30 1200) Arterial Line BP: (115-158)/(39-55) 148/46 (05/30 1200) FiO2 (%):  [40 %] 40 % (05/30 1200) Weight:  [55.1 kg] 55.1 kg (05/30 0500)  General - Well nourished, well developed, intubated on sedation.  Ophthalmologic - fundi not visualized due to noncooperation.  Cardiovascular - Regular rate and rhythm.  Neuro - intubated on sedation, eyes closed, not open on voice, not able to follow simple commands. Eyes in mid position, not blinking to visual threat, no tracking, pupils 1.65mm, sluggish to light. No doll's eyes. Corneal reflex weakly present , gag and cough weakly present. Breathing over the vent.  Facial symmetry not able to test due to ET tube.  Not moving all extremities spontaneously, no movement BUEs on pain, only flickering movement BLEs on pain. Sensation, coordination and gait not tested.   ASSESSMENT/PLAN [name] is a 66 y.o. male with history of TBI with chronic dysarthria, seizure, hypertension, hyperlipidemia, CAD, OSA, carotid stenosis bilaterally admitted for worsening dysarthria, bilateral leg weakness. No tPA given due to outside window.    TIA likely due to bilateral ICA stenosis in the setting of soft BP CT no acute abnormality  MRI no acute infarct CT head and neck severe bilateral proximal ICA stenosis, right more than left, and decreased downstream flow on the right.  Bilateral ICA siphon moderate to severe stenosis.  Moderate left M2 and right M1 segment stenosis.  60% right proximal subclavian artery stenosis.  Left VA origin stenosis. Carotid Doppler Right near occlusion and left ICA 80-99% stenosis 2D Echo EF 60 to 65%, ? MV mass on the atrial surface EEG no seizure, cortical dysfunction arising from left temporal region as well as mild diffuse encephalopathy LDL 56 HgbA1c 6.2 Heparin IV for VTE prophylaxis aspirin 325 mg daily prior to admission, now on ASA 81 and plavix 75. 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  EEG negative.  CT/MRI/MRA negative.  However, carotid Doppler showed bilateral ICA more than 70% stenosis, right more than left.  EF 55 to 60%.  Discharged on aspirin 325.  Bilateral carotid stenosis 01/2020 carotid Doppler showed bilateral ICA > 70% stenosis, right more than left CT head and neck severe bilateral proximal ICA stenosis, right more than left, and decreased downstream flow on the right.  Carotid Doppler right ICA near occlusion, left ICA 80 to 99% stenosis Vascular surgery Dr. Donzetta Matters on board Given current comorbidities, not a good candidate for carotid revascularization at this time  ? Mitral valve mass/vegetation Non-STEMI TTE showed ? MV mass/vegetation on the atrial surface Cardiology on board TEE once stable is recommended by  cardiology  High risk for cardiovascular procedure at the time On ASA, Plavix  Hypotension History of hypertension Low BP after intubation On Levophed drip Off Neo Avoid low BP given bilateral extracranial carotid stenosis and intracranial stenosis BP goal 120-1 60 before carotid revascularization  Hyperlipidemia Home meds: Zocor 20 LDL 56, goal < 70 Now on Zocor 20 given LDL at goal Continue statin at discharge  Other Stroke Risk Factors Advanced age Obstructive sleep apnea, on CPAP at home Coronary artery disease  Other Active Problems History of TBI with residual of severe dysarthria  Hospital day # 3  This patient is critically ill due to respiratory failure, bilateral carotid high-grade stenosis, TIA, mitral valve vegetation and at significant risk of neurological worsening, death form stroke, heart failure, sepsis.  This patient's care requires constant monitoring of vital signs, hemodynamics, respiratory and cardiac monitoring, review of multiple databases, neurological assessment, discussion with family, other specialists and medical decision making of high complexity. I spent 40 minutes of neurocritical care time in the care of this patient. Discussed with Dr. Lamonte Sakai CCM.   Rosalin Hawking, MD PhD Stroke Neurology 11/11/2021 12:43 PM    To contact Stroke Continuity provider, please refer to [email or web address] After hours, contact General Neurology

## 2021-11-11 NOTE — Progress Notes (Signed)
ANTICOAGULATION CONSULT NOTE - Follow Up Consult  Pharmacy Consult for heparin Indication: chest pain/ACS  Allergies  Allergen Reactions   Codeine Rash    Patient Measurements: Height: 5\' 7"  (170.2 cm) Weight: 53.3 kg (117 lb 8.1 oz) IBW/kg (Calculated) : 66.1 Heparin Dosing Weight: 57 kg  Vital Signs: Temp: 99.9 F (37.7 C) (05/30 0333) Temp Source: Oral (05/30 0333) BP: 93/60 (05/30 0300) Pulse Rate: 57 (05/30 0300)  Labs: Recent Labs    10/18/2021 1149 11/07/2021 1157 10/18/2021 1245 11/05/2021 1445 10/18/2021 1554 11/09/21 0624 11/09/21 1425 11/10/21 0013 11/10/21 0133 11/10/21 1158 11/10/21 1947 11/10/21 2248 11/11/21 0254  HGB 12.6*   < >  --  11.8*  --  10.9*  --  10.9* 10.9*  --   --   --  10.6*  HCT 38.3*   < >  --  36.1*  --  33.7*  --  32.0* 32.2*  --   --   --  31.5*  PLT 209  --   --  199  --  189  --   --  204  --   --   --  171  APTT 24  --   --   --   --   --   --   --   --   --   --   --   --   LABPROT 14.2  --   --   --   --   --   --   --   --   --   --   --   --   INR 1.1  --   --   --   --   --   --   --   --   --   --   --   --   HEPARINUNFRC  --   --   --   --   --  <0.10*   < >  --  0.17* 0.27* 0.24*  --  0.26*  CREATININE 0.70   < >  --   --   --  0.80  --   --  0.97  --   --  0.69 0.70  TROPONINIHS  --   --  530* 561* 557*  --   --   --   --   --   --   --   --    < > = values in this interval not displayed.     Estimated Creatinine Clearance: 67.6 mL/min (by C-G formula based on SCr of 0.7 mg/dL).   Medications:  Medications Prior to Admission  Medication Sig Dispense Refill Last Dose   albuterol (VENTOLIN HFA) 108 (90 Base) MCG/ACT inhaler 2 PUFFS EVERY 6 HOURS AS NEEDED FOR WHEEZING OR SHORTNESS OF BREATH (Patient taking differently: Inhale 2 puffs into the lungs every 6 (six) hours as needed for wheezing or shortness of breath.) 18 g 1 unknown   aspirin 325 MG tablet Take 325 mg by mouth daily.   11/12/2021   busPIRone (BUSPAR) 10 MG  tablet TAKE ONE TABLET THREE TIMES DAILY (Patient taking differently: Take 10 mg by mouth 3 (three) times daily.) 90 tablet 1 10/31/2021   ipratropium-albuterol (DUONEB) 0.5-2.5 (3) MG/3ML SOLN INHALE 3 MLS BY NEBULIZER 4 TIMES A DAY (Patient taking differently: Take 3 mLs by nebulization every 6 (six) hours as needed (wheezing/shortness of breath).) 360 mL 0 unknown   mupirocin ointment (BACTROBAN) 2 % PLACE 1 APPLICATION INTO  THE NOSE 2 TIMES A DAY (Patient taking differently: Apply 1 application. topically 2 (two) times daily. PLACE 1 APPLICATION INTO THE NOSE 2 TIMES A DAY) 22 g 5 Past Week   MYRBETRIQ 25 MG TB24 tablet TAKE ONE TABLET ONCE DAILY (Patient taking differently: Take 25 mg by mouth daily.) 30 tablet 5 10/15/2021   niacin (NIASPAN) 1000 MG CR tablet TAKE 1 TABLET BY MOUTH AT BEDTIME. (Patient taking differently: Take 1,000 mg by mouth at bedtime.) 90 tablet 0 10/19/2021   Nystatin (GERHARDT'S BUTT CREAM) CREA Apply 1 application topically daily. Equal parts nystatin and hydrocortisone and zinc oxide 1 each 2 Past Week   omeprazole (PRILOSEC) 40 MG capsule TAKE (1) CAPSULE DAILY (Patient taking differently: Take 40 mg by mouth daily.) 90 capsule 0 11/06/2021   PARoxetine (PAXIL) 20 MG tablet TAKE ONE TABLET ONCE DAILY (Patient taking differently: Take 20 mg by mouth daily.) 90 tablet 0 11/02/2021   Potassium 99 MG TABS Take 1 tablet by mouth daily.   11/11/2021   pramipexole (MIRAPEX) 0.5 MG tablet Take 2 tablets (1 mg total) by mouth at bedtime. 90 tablet 3 11/07/2021   risperiDONE (RISPERDAL) 2 MG tablet Take 1 tablet (2 mg total) by mouth at bedtime. 90 tablet 3 11/07/2021   simvastatin (ZOCOR) 20 MG tablet TAKE ONE TABLET AT BEDTIME (Patient taking differently: Take 20 mg by mouth daily at 6 PM.) 90 tablet 0 11/07/2021   thiamine 100 MG tablet Take 1 tablet (100 mg total) by mouth daily. 30 tablet 2 10/23/2021    Assessment: 66 year old male transferred from [facility] to [facility] for stroke and cardiac  work up. History of recent CVA. Patient with negative CT and MRI for bleeding. Positive troponins to start IV heparin. Not on anticoagulants prior to admission. He was taking aspirin 325 mg daily (was increased from 81 mg daily in August 2021 during admission for TIA w/ MRA head noting extensive intracranial atherosclerosis).  Heparin level still subtherapeutic at 0.26 on heparin 1200 units/hr. Patient did have bleeding from nose 2/2 to nostril suctioning overnight (5/28-5/29). No issues with infusion or bleeding since. CBC is stable.   Goal of Therapy:  Heparin level 0.3-0.5 units/ml Monitor platelets by anticoagulation protocol: Yes   Plan:  Increase heparin infusion to 1300 units/hr Check anti-Xa level in 6-8 hours and daily while on heparin Continue to monitor H&H and platelets F/u anticoag and antiplatelet plans  Georga Bora, PharmD Clinical Pharmacist 11/11/2021 3:43 AM Please check AMION for all [place] numbers

## 2021-11-11 NOTE — Progress Notes (Signed)
Progress Note  Patient Name: [name] Date of Encounter: 11/11/2021  [facility] Cardiologist: Buford Dresser, MD   Subjective   Remains intubated and sedated. Continues to be hypotensive despite pressors.  Inpatient Medications    Scheduled Meds:  aspirin  81 mg Per Tube Daily   atorvastatin  80 mg Per Tube QHS   chlorhexidine gluconate (MEDLINE KIT)  15 mL Mouth Rinse BID   Chlorhexidine Gluconate Cloth  6 each Topical Q0600   clopidogrel  75 mg Per Tube Daily   docusate  100 mg Per Tube BID   insulin aspart  0-9 Units Subcutaneous Q4H   mouth rinse  15 mL Mouth Rinse 10 times per day   melatonin  9 mg Per Tube QHS   pantoprazole sodium  40 mg Per Tube Daily   PARoxetine  20 mg Per Tube Daily   polyethylene glycol  17 g Per Tube Daily   pramipexole  1 mg Per Tube QHS   sodium chloride flush  3 mL Intravenous Q12H   Continuous Infusions:  sodium chloride     sodium chloride     sodium chloride     ampicillin-sulbactam (UNASYN) IV Stopped (11/11/21 0559)   dexmedetomidine (PRECEDEX) IV infusion Stopped (11/11/21 0809)   fentaNYL infusion INTRAVENOUS Stopped (11/11/21 0859)   heparin 1,300 Units/hr (11/11/21 1000)   norepinephrine (LEVOPHED) Adult infusion 5 mcg/min (11/11/21 1007)   phenylephrine (NEO-SYNEPHRINE) Adult infusion Stopped (11/10/21 0538)   PRN Meds: Place/Maintain arterial line **AND** sodium chloride, acetaminophen **OR** acetaminophen, bisacodyl, fentaNYL, hydrALAZINE, ipratropium, levalbuterol, midazolam, midazolam, nitroGLYCERIN, ondansetron **OR** ondansetron (ZOFRAN) IV, senna-docusate, traZODone   Vital Signs    Vitals:   11/11/21 0915 11/11/21 0930 11/11/21 0945 11/11/21 1000  BP:    (!) 77/54  Pulse: (!) 50 (!) 49 (!) 52 (!) 51  Resp: $Remo'16 16 16 16  'PQQEt$ Temp:      TempSrc:      SpO2: 97% 98% 98% 98%  Weight:      Height:        Intake/Output Summary (Last 24 hours) at 11/11/2021 1027 Last data filed at 11/11/2021  1000 Gross per 24 hour  Intake 2026.75 ml  Output 1000 ml  Net 1026.75 ml      11/11/2021    5:00 AM 11/10/2021    4:56 AM 11/09/2021   10:50 PM  Last 3 Weights  Weight (lbs) 121 lb 7.6 oz 117 lb 8.1 oz 117 lb 8.1 oz  Weight (kg) 55.1 kg 53.3 kg 53.3 kg      Telemetry    SR - Personally Reviewed  ECG    No new since 5/27 - Personally Reviewed  Physical Exam   GEN: frail elderly gentleman, intubated/sedated NECK: No JVD CARDIAC: regular rhythm, normal S1 and S2, no rubs or gallops. No murmur. VASCULAR: +carotid bruit RESPIRATORY:  ETT/on ventilator ABDOMEN: Soft, non-tender, non-distended MUSCULOSKELETAL:  Moves all 4 limbs independently SKIN: Warm and dry, no edema NEUROLOGIC:  sedated PSYCHIATRIC:  sedated  Labs    High Sensitivity Troponin:   Recent Labs  Lab 10/20/2021 1245 11/03/2021 1445 11/01/2021 1554  TROPONINIHS 530* 561* 557*     Chemistry Recent Labs  Lab 11/09/21 0624 11/10/21 0013 11/10/21 0133 11/10/21 2248 11/11/21 0254  NA 136   < > 136 137 134*  K 3.7   < > 4.0 3.8 3.7  CL 103  --  106 106 103  CO2 23  --  $R'23 25 24  'yp$ GLUCOSE 88  --  189* 127* 128*  BUN 17  --  $R'18 17 17  'Yc$ CREATININE 0.80  --  0.97 0.69 0.70  CALCIUM 8.5*  --  7.8* 7.9* 7.9*  MG 2.0  --  2.1 1.9 1.9  PROT 5.7*  --  5.4*  --  5.2*  ALBUMIN 2.9*  --  2.5*  --  2.3*  AST 29  --  44*  --  39  ALT 19  --  22  --  22  ALKPHOS 56  --  62  --  62  BILITOT 1.4*  --  1.0  --  0.8  GFRNONAA >60  --  >60 >60 >60  ANIONGAP 10  --  $R'7 6 7   'tv$ < > = values in this interval not displayed.    Lipids  Recent Labs  Lab 11/02/2021 1148  CHOL 127  TRIG 34  HDL 64  LDLCALC 56  CHOLHDL 2.0    Hematology Recent Labs  Lab 11/09/21 0624 11/10/21 0013 11/10/21 0133 11/11/21 0254  WBC 11.8*  --  17.5* 12.7*  RBC 3.32*  --  3.20* 3.10*  HGB 10.9* 10.9* 10.9* 10.6*  HCT 33.7* 32.0* 32.2* 31.5*  MCV 101.5*  --  100.6* 101.6*  MCH 32.8  --  34.1* 34.2*  MCHC 32.3  --  33.9 33.7  RDW  13.2  --  12.9 13.0  PLT 189  --  204 171   Thyroid  Recent Labs  Lab 11/09/21 1100  TSH 0.812    BNP Recent Labs  Lab 11/09/21 0624 11/10/21 0133 11/11/21 0254  BNP 620.9* 932.8* 834.2*    DDimer No results for input(s): DDIMER in the last 168 hours.   Radiology    CT ANGIO HEAD NECK W WO CM  Result Date: 11/09/2021 CLINICAL DATA:  Stroke workup EXAM: CT ANGIOGRAPHY HEAD AND NECK TECHNIQUE: Multidetector CT imaging of the head and neck was performed using the standard protocol during bolus administration of intravenous contrast. Multiplanar CT image reconstructions and MIPs were obtained to evaluate the vascular anatomy. Carotid stenosis measurements (when applicable) are obtained utilizing NASCET criteria, using the distal internal carotid diameter as the denominator. RADIATION DOSE REDUCTION: This exam was performed according to the departmental dose-optimization program which includes automated exposure control, adjustment of the mA and/or kV according to patient size and/or use of iterative reconstruction technique. CONTRAST:  162mL OMNIPAQUE IOHEXOL 350 MG/ML SOLN COMPARISON:  Stroke MRI from yesterday FINDINGS: CT HEAD FINDINGS Brain: No evidence of acute infarction, hemorrhage, hydrocephalus, extra-axial collection or mass lesion/mass effect. Chronic small vessel ischemia in the deep white matter with chronic lacunar/perforator infarcts at the left deep gray nuclei and right caudate head. Generalized cerebral volume loss. Vascular: No hyperdense vessel or unexpected calcification. Skull: Remote left-sided burr holes.  No acute or aggressive finding Sinuses: Extensive bilateral sinus opacification at the level of the maxillary and asymmetric left ethmoid sinuses. Orbits: No acute finding Review of the MIP images confirms the above findings CTA NECK FINDINGS Aortic arch: Minimal coverage, the brachiocephalic artery origin is not seen. Atherosclerosis. Right carotid system: Diffuse  calcified plaque with severe bulky plaque at the bifurcation completely obscuring a patent lumen. The downstream right ICA is under filled compared to the left. Left carotid system: Calcified plaque primarily at the common carotid origin and bifurcation. A mixed density plaque at the left ICA origin has a web-like component causing over 70% stenosis. The left ECA origin is occluded with downstream reconstitution. Vertebral arteries: Calcified plaque  involving both proximal subclavian arteries. Proximal right subclavian stenosis measures 60%. Calcified plaque at both V1 segments with high-grade narrowing on the left. The right vertebral artery is dominant. No dissection or beading Skeleton: Generalized cervical spine degeneration which is advanced. Other neck: No acute finding Upper chest: Severe emphysema at the apices. Review of the MIP images confirms the above findings CTA HEAD FINDINGS Anterior circulation: Extensive calcified plaque along the bilateral carotid siphons to the degree that calcified plaque over estimates the diffuse narrowed appearance. Mild to moderate bilateral ICA stenosis is possible. No major branch occlusion, beading, or aneurysm. Bilateral atheromatous narrowing of MCA branches with moderate left M2 and right M1 stenosis see seen on coronal thick MIPS. Narrow left A1 segment which is likely both acquired and congenital. Posterior circulation: The vertebral and basilar arteries are smoothly contoured and diffusely patent. No branch occlusion, beading, or aneurysm. Venous sinuses: Unremarkable Anatomic variants: None significant Review of the MIP images confirms the above findings IMPRESSION: 1. Severe bilateral proximal ICA stenosis due to advanced atherosclerosis. On the right a measurable channel is not visible and there is downstream underfilling. 2. 60% narrowing at the right proximal subclavian artery. The brachiocephalic origin was not covered on the scan. 3. Advanced narrowing at the  origin of the non dominant left vertebral artery. 4. Moderate left M2 and right M1 segment stenoses. 5. Aortic Atherosclerosis (ICD10-I70.0) and Emphysema (ICD10-J43.9). Electronically Signed   By: Jorje Guild M.D.   On: 11/09/2021 11:47   DG CHEST PORT 1 VIEW  Result Date: 11/10/2021 CLINICAL DATA:  Central line placement. EXAM: PORTABLE CHEST 1 VIEW COMPARISON:  Chest x-ray 11/09/2021. FINDINGS: Endotracheal tube tip is 5.4 cm above the carina. Enteric tube extends below the level of the diaphragm. Left sided central venous catheter tip projects over the SVC. The cardiomediastinal silhouette is stable and within normal limits. There are hazy patchy opacities in the left mid and lower lung, unchanged. There is central pulmonary vascular congestion, unchanged. There is no pleural effusion or pneumothorax visualized. IMPRESSION: 1. New left-sided central venous catheter tip projects over the SVC. 2. Stable left mid and lower lung airspace disease with central pulmonary vascular congestion. Electronically Signed   By: Ronney Asters M.D.   On: 11/10/2021 01:25   DG CHEST PORT 1 VIEW  Result Date: 11/09/2021 CLINICAL DATA:  Intubation, NG tube placement. EXAM: PORTABLE CHEST 1 VIEW COMPARISON:  11/09/2021. FINDINGS: The heart size and mediastinal contours are stable. The pulmonary vasculature is distended. Atherosclerotic calcification of the aorta is noted. Interstitial prominence and patchy perihilar airspace disease is noted bilaterally, increased from the prior exam. No effusion or pneumothorax. The endotracheal tube terminates 5.2 cm above the carina. There is gaseous distention of the stomach with an enteric tube terminating in the distal stomach. No acute osseous abnormality. IMPRESSION: 1. Mildly distended pulmonary vasculature. 2. Interstitial prominence bilaterally with patchy perihilar airspace disease, increased from the prior exam, possible edema or infiltrate. 3. Support apparatus as detailed  above. Electronically Signed   By: Brett Fairy M.D.   On: 11/09/2021 22:31   DG CHEST PORT 1 VIEW  Result Date: 11/09/2021 CLINICAL DATA:  Respiratory distress. EXAM: PORTABLE CHEST 1 VIEW COMPARISON:  11/09/2021. FINDINGS: The heart size and mediastinal contours are stable. The pulmonary vasculature is mildly distended. Increased interstitial and perihilar opacities are noted bilaterally. No effusion or pneumothorax. No acute osseous abnormality. IMPRESSION: 1. Mildly distended pulmonary vasculature. 2. Increased interstitial and perihilar airspace opacities suggesting edema, less likely  infiltrate. Electronically Signed   By: Brett Fairy M.D.   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  EEG to evaluate for seizure. Level of alertness: Awake, asleep AEDs during EEG study: None Technical aspects: This EEG study was done with scalp electrodes positioned according to the 10-20 International system of electrode placement. Electrical activity was acquired at a sampling rate of $Remov'500Hz'qNVNpI$  and reviewed with a high frequency filter of $RemoveB'70Hz'mqrYLVhV$  and a low frequency filter of $RemoveB'1Hz'GYshsMIm$ . EEG data were recorded continuously and digitally stored. Description: The posterior dominant rhythm consists of 8 Hz activity of moderate voltage (25-35 uV) seen predominantly in posterior head regions, symmetric and reactive to eye opening and eye closing. Sleep was characterized by vertex waves, sleep spindles (12 to 14 Hz), maximal frontocentral region. EEG showed intermittent generalized and maximal left temporal region 3 to 6 Hz theta-delta slowing. Hyperventilation and photic stimulation were not  performed.   ABNORMALITY - Intermittent slow, generalized and maximal left temporal region IMPRESSION: This study is suggestive of cortical dysfunction arising from left temporal region as well as mild diffuse encephalopathy, nonspecific etiology.  No seizures or epileptiform discharges were seen throughout the recording. Lora Havens   ECHOCARDIOGRAM COMPLETE  Result Date: 11/09/2021    ECHOCARDIOGRAM REPORT   Patient Name:   [name] Date of Exam: 11/09/2021 Medical Rec #:  [id number]           Height:       67.0 in Accession #:    [id number]          Weight:       126.3 lb Date of Birth:  11-Dec-1955           BSA:          1.664 m Patient Age:    29 years            BP:           119/53 mmHg Patient Gender: M                   HR:           79 bpm. Exam Location:  Inpatient Procedure: 2D Echo, Cardiac Doppler and Color Doppler Indications:    Stroke  History:        Patient has prior history of Echocardiogram examinations, most                 recent 02/03/2020. CAD, TIA and COPD; Risk Factors:Current                 Smoker, Hypertension and Dyslipidemia.  Sonographer:    Clayton Lefort RDCS (AE) Referring Phys: [id number] Margaree Mackintosh Benton  1. Left ventricular ejection fraction, by estimation, is 60 to 65%. The left ventricle has normal function. The left ventricle has no regional wall motion abnormalities. Left ventricular diastolic parameters were normal.  2. Right ventricular systolic function is normal. The right ventricular size is normal.  3. Only in parasternal long axis views ? mass on atrial surface of posterior leaflet may be shadowing artifact but in setting of TIA consider f/u TEE if clinically indicated . The mitral valve is abnormal.  Trivial mitral valve regurgitation. No evidence  of mitral stenosis.  4. The aortic valve is tricuspid. There is mild calcification of the aortic valve. There is mild thickening of the aortic valve. Aortic valve regurgitation is not visualized. Aortic  valve sclerosis is present, with no evidence of aortic valve stenosis.  5. The inferior vena cava is dilated in size with >50% respiratory variability, suggesting right atrial pressure of 8 mmHg. FINDINGS  Left Ventricle: Left ventricular ejection fraction, by estimation, is 60 to 65%. The left ventricle has normal function. The left ventricle has no regional wall motion abnormalities. The left ventricular internal cavity size was normal in size. There is  no left ventricular hypertrophy. Left ventricular diastolic parameters were normal. Right Ventricle: The right ventricular size is normal. No increase in right ventricular wall thickness. Right ventricular systolic function is normal. Left Atrium: Left atrial size was normal in size. Right Atrium: Right atrial size was normal in size. Pericardium: There is no evidence of pericardial effusion. Mitral Valve: Only in parasternal long axis views ? mass on atrial surface of posterior leaflet may be shadowing artifact but in setting of TIA consider f/u TEE if clinically indicated. The mitral valve is abnormal. There is mild thickening of the mitral  valve leaflet(s). Trivial mitral valve regurgitation. No evidence of mitral valve stenosis. Tricuspid Valve: The tricuspid valve is normal in structure. Tricuspid valve regurgitation is not demonstrated. No evidence of tricuspid stenosis. Aortic Valve: The aortic valve is tricuspid. There is mild calcification of the aortic valve. There is mild thickening of the aortic valve. Aortic valve regurgitation is not visualized. Aortic valve sclerosis is present, with no evidence of aortic valve stenosis. Aortic valve mean gradient measures 2.0 mmHg. Aortic valve peak gradient measures 4.2 mmHg. Aortic valve area, by VTI measures 3.33 cm. Pulmonic Valve: The pulmonic valve was normal in structure. Pulmonic valve regurgitation is not visualized. No evidence of pulmonic stenosis. Aorta: The aortic root is normal in size and  structure. Venous: The inferior vena cava is dilated in size with greater than 50% respiratory variability, suggesting right atrial pressure of 8 mmHg. IAS/Shunts: No atrial level shunt detected by color flow Doppler.  LEFT VENTRICLE PLAX 2D LVIDd:         5.10 cm   Diastology LVIDs:         3.80 cm   LV e' medial:    7.83 cm/s LV PW:         0.90 cm   LV E/e' medial:  12.5 LV IVS:        1.20 cm   LV e' lateral:   8.16 cm/s LVOT diam:     2.20 cm   LV E/e' lateral: 12.0 LV SV:         62 LV SV Index:   37 LVOT Area:     3.80 cm  RIGHT VENTRICLE             IVC RV Basal diam:  2.90 cm     IVC diam: 2.60 cm RV S prime:     12.50 cm/s TAPSE (M-mode): 2.2 cm LEFT ATRIUM             Index        RIGHT ATRIUM           Index LA diam:        3.20 cm 1.92 cm/m   RA Area:     18.40 cm LA Vol (A2C):   49.8 ml  29.94 ml/m  RA Volume:   43.60 ml  26.21 ml/m LA Vol (A4C):   52.7 ml 31.68 ml/m LA Biplane Vol: 54.1 ml 32.52 ml/m  AORTIC VALVE AV Area (Vmax):    3.54 cm AV Area (Vmean):   3.37 cm AV Area (VTI):     3.33 cm AV Vmax:           102.00 cm/s AV Vmean:          65.600 cm/s AV VTI:            0.186 m AV Peak Grad:      4.2 mmHg AV Mean Grad:      2.0 mmHg LVOT Vmax:         94.90 cm/s LVOT Vmean:        58.200 cm/s LVOT VTI:          0.163 m LVOT/AV VTI ratio: 0.88  AORTA Ao Root diam: 3.20 cm Ao Asc diam:  2.70 cm MITRAL VALVE MV Area (PHT): 5.62 cm    SHUNTS MV Decel Time: 135 msec    Systemic VTI:  0.16 m MV E velocity: 98.00 cm/s  Systemic Diam: 2.20 cm MV A velocity: 56.10 cm/s MV E/A ratio:  1.75 Jenkins Rouge MD Electronically signed by Jenkins Rouge MD Signature Date/Time: 11/09/2021/2:11:08 PM    Final    VAS US CAROTID  Result Date: 11/10/2021 Carotid Arterial Duplex Study Patient Name:  [name]  Date of Exam:   11/09/2021 Medical Rec #: [id number]            Accession #:    [id number] Date of Birth: [birth year]            Patient Gender: M Patient Age:   20 years Exam Location:  [facility] Procedure:      VAS US CAROTID Referring Phys: Servando Snare --------------------------------------------------------------------------------  Indications:       CVA, Speech disturbance and Weakness. NSTEMI. Risk Factors:      Hypertension, hyperlipidemia, coronary artery disease, prior                    CVA and TBI. Other Factors:     CTA of neck done 11/09/21 indicated "Severe bilateral proximal                    ICA stenosis due to advanced atherosclerosis. On the right a                    measurable channel is not visible and there is downstream                    underfilling." Limitations        Today's exam was limited due to heavy calcification and the                    resulting shadowing and coughing and shaking of legs. Comparison Study:  Prior carotid duplex done at [facility] 11/14/19 indicated                    R:80-99% ICA stenosis, highest end of range and L:60-79% ICA                    stenosis, highest end of range Performing Technologist: Sharion Dove RVS  Examination Guidelines: A complete evaluation includes B-mode imaging, spectral Doppler, color Doppler, and power Doppler as needed of all  accessible portions of each vessel. Bilateral testing is considered an integral part of a complete examination. Limited examinations for reoccurring indications may be performed as noted.  Right Carotid Findings: +----------+--------+--------+--------+----------------------+---------+           PSV cm/sEDV cm/sStenosisPlaque Description    Comments  +----------+--------+--------+--------+----------------------+---------+ CCA Prox  50      9               calcific              Shadowing +----------+--------+--------+--------+----------------------+---------+ CCA Distal24      6               calcific              Shadowing +----------+--------+--------+--------+----------------------+---------+ ICA Prox  453     74              calcific and irregularShadowing  +----------+--------+--------+--------+----------------------+---------+ ECA       95      11                                              +----------+--------+--------+--------+----------------------+---------+ +----------+--------+-------+--------+-------------------+           PSV cm/sEDV cmsDescribeArm Pressure (mmHG) +----------+--------+-------+--------+-------------------+ Subclavian110                                        +----------+--------+-------+--------+-------------------+ +---------+--------+---+--------+--+ VertebralPSV cm/s214EDV cm/s49 +---------+--------+---+--------+--+  Left Carotid Findings: +----------+-------+--------+--------+--------------------+--------------------+           PSV    EDV cm/sStenosisPlaque Description  Comments                       cm/s                                                            +----------+-------+--------+--------+--------------------+--------------------+ CCA Prox  135    27              heterogenous                             +----------+-------+--------+--------+--------------------+--------------------+ CCA Distal126    21              heterogenous                             +----------+-------+--------+--------+--------------------+--------------------+ ICA Prox  581    132     80-99%  calcific and                                                              irregular                                +----------+-------+--------+--------+--------------------+--------------------+ ICA Mid  196    47                                                       +----------+-------+--------+--------+--------------------+--------------------+ ICA Distal81     20                                                       +----------+-------+--------+--------+--------------------+--------------------+ ECA       263    35                                  mid to distal                                                              portion              +----------+-------+--------+--------+--------------------+--------------------+ +----------+--------+--------+--------+-------------------+           PSV cm/sEDV cm/sDescribeArm Pressure (mmHG) +----------+--------+--------+--------+-------------------+ [id number]                                          +----------+--------+--------+--------+-------------------+ +---------+--------+--+--------+-+ VertebralPSV cm/s21EDV cm/s8 +---------+--------+--+--------+-+   Summary: Right Carotid: Near occlusion of the proximal right ICA. Left Carotid: Velocities in the left ICA are consistent with a 80-99% stenosis. Vertebrals:  Bilateral vertebral arteries demonstrate antegrade flow. Subclavians: Normal flow hemodynamics were seen in bilateral subclavian              arteries. *See table(s) above for measurements and observations.  Electronically signed by Servando Snare MD on 11/10/2021 at 10:56:26 AM.    Final     Cardiac Studies   Echo 11/09/21 1. Left ventricular ejection fraction, by estimation, is 60 to 65%. The  left ventricle has normal function. The left ventricle has no regional  wall motion abnormalities. Left ventricular diastolic parameters were  normal.   2. Right ventricular systolic function is normal. The right ventricular  size is normal.   3. Only in parasternal long axis views ? mass on atrial surface of  posterior leaflet may be shadowing artifact but in setting of TIA consider  f/u TEE if clinically indicated . The mitral valve is abnormal. Trivial  mitral valve regurgitation. No evidence   of mitral stenosis.   4. The aortic valve is tricuspid. There is mild calcification of the  aortic valve. There is mild thickening of the aortic valve. Aortic valve  regurgitation is not visualized. Aortic valve sclerosis is present, with  no evidence of aortic valve stenosis.   5. The inferior vena cava is  dilated in size with >50% respiratory  variability, suggesting right atrial pressure of 8 mmHg.   Patient Profile     66 y.o. 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  Assessment & Plan    Elevated troponin -difficult situation. His hsTn is elevated but flat, and he is asymptomatic -given his PAD, I expect he also has CAD. However, with his acute issues and lack of chest pain, as well as preserved EF on echo, recommend medical management at this time -he is on heparin drip, aspirin 81 mg, and clopidogrel currently -echo with EF 60-65%, no regional wall motion abnormalities -intensified statin this admission  Carotid artery disease, severe bilateral TIA -I personally reviewed his echo. The mitral valve leaflet appears abnormal in several views. Could consider TEE once more stable, though doubt he would be a candidate for open heart surgery if abnormality found on mitral valve. He is at elevated risk for TEE at this time, and if it would not change management, would wait until he is back to his baseline clinical status. -not a short term surgical candidate per vascular surgery, would need to recover and could be reconsidered weeks to months in the future  Aspiration event, acute respiratory failure Acute hypotension -now intubated and requiring pressors  Overall he would need to return to prior baseline before consideration of cardiac procedures. Also doubt he would be a surgical candidate, especially with unrevascularized severe bilateral carotid disease. Given this, no utility to TEE until if/when he can have carotid revascularization. Strongly suspect that carotid disease more of an issue than possible MV mass as no evidence of embolic stroke on imaging.  Overall I am concerned about his  prognosis given his multiple high risk medical issues. These are currently non-cardiac, so we do not have anything additional to offer at this point.   [facility] will sign off.  Please contact us with any further questions or concerns. Medication Recommendations:  continue aspirin 81 mg, clopidogrel 75 mg, atorvastatin 80 mg daily. From a cardiac standpoint, can stop heparin drip as he is now >48 hours out. Ok to continue if recommended from neuro standpoint Other recommendations (labs, testing, etc):  none Follow up as an outpatient:  Please contact us as patient nears discharge so that we can arrange for outpatient cardiology follow up.  For questions or updates, please contact [place] Please consult [email or web address] for contact info under     Signed, Buford Dresser, MD  11/11/2021, 10:27 AM

## 2021-11-12 ENCOUNTER — Ambulatory Visit: Payer: Medicare Other | Admitting: Family Medicine

## 2021-11-12 ENCOUNTER — Inpatient Hospital Stay (HOSPITAL_COMMUNITY): Payer: Medicare Other

## 2021-11-12 DIAGNOSIS — J14 Pneumonia due to Hemophilus influenzae: Secondary | ICD-10-CM | POA: Diagnosis not present

## 2021-11-12 DIAGNOSIS — I639 Cerebral infarction, unspecified: Secondary | ICD-10-CM | POA: Diagnosis not present

## 2021-11-12 DIAGNOSIS — I6523 Occlusion and stenosis of bilateral carotid arteries: Secondary | ICD-10-CM | POA: Diagnosis not present

## 2021-11-12 DIAGNOSIS — E782 Mixed hyperlipidemia: Secondary | ICD-10-CM | POA: Diagnosis not present

## 2021-11-12 DIAGNOSIS — J690 Pneumonitis due to inhalation of food and vomit: Secondary | ICD-10-CM | POA: Diagnosis not present

## 2021-11-12 DIAGNOSIS — G40909 Epilepsy, unspecified, not intractable, without status epilepticus: Secondary | ICD-10-CM | POA: Diagnosis not present

## 2021-11-12 LAB — CBC WITH DIFFERENTIAL/PLATELET
Abs Immature Granulocytes: 0.08 10*3/uL — ABNORMAL HIGH (ref 0.00–0.07)
Basophils Absolute: 0.1 10*3/uL (ref 0.0–0.1)
Basophils Relative: 0 %
Eosinophils Absolute: 0.1 10*3/uL (ref 0.0–0.5)
Eosinophils Relative: 1 %
HCT: 29.8 % — ABNORMAL LOW (ref 39.0–52.0)
Hemoglobin: 10 g/dL — ABNORMAL LOW (ref 13.0–17.0)
Immature Granulocytes: 1 %
Lymphocytes Relative: 10 %
Lymphs Abs: 1.3 10*3/uL (ref 0.7–4.0)
MCH: 33.9 pg (ref 26.0–34.0)
MCHC: 33.6 g/dL (ref 30.0–36.0)
MCV: 101 fL — ABNORMAL HIGH (ref 80.0–100.0)
Monocytes Absolute: 1.5 10*3/uL — ABNORMAL HIGH (ref 0.1–1.0)
Monocytes Relative: 11 %
Neutro Abs: 10.4 10*3/uL — ABNORMAL HIGH (ref 1.7–7.7)
Neutrophils Relative %: 77 %
Platelets: 160 10*3/uL (ref 150–400)
RBC: 2.95 MIL/uL — ABNORMAL LOW (ref 4.22–5.81)
RDW: 12.7 % (ref 11.5–15.5)
WBC: 13.4 10*3/uL — ABNORMAL HIGH (ref 4.0–10.5)
nRBC: 0 % (ref 0.0–0.2)

## 2021-11-12 LAB — COMPREHENSIVE METABOLIC PANEL
ALT: 24 U/L (ref 0–44)
AST: 46 U/L — ABNORMAL HIGH (ref 15–41)
Albumin: 2 g/dL — ABNORMAL LOW (ref 3.5–5.0)
Alkaline Phosphatase: 76 U/L (ref 38–126)
Anion gap: 3 — ABNORMAL LOW (ref 5–15)
BUN: 17 mg/dL (ref 8–23)
CO2: 27 mmol/L (ref 22–32)
Calcium: 7.8 mg/dL — ABNORMAL LOW (ref 8.9–10.3)
Chloride: 106 mmol/L (ref 98–111)
Creatinine, Ser: 0.7 mg/dL (ref 0.61–1.24)
GFR, Estimated: >60 mL/min (ref >60)
Glucose, Bld: 115 mg/dL — ABNORMAL HIGH (ref 70–99)
Potassium: 3.9 mmol/L (ref 3.5–5.1)
Sodium: 136 mmol/L (ref 135–145)
Total Bilirubin: 0.9 mg/dL (ref 0.3–1.2)
Total Protein: 4.8 g/dL — ABNORMAL LOW (ref 6.5–8.1)

## 2021-11-12 LAB — GLUCOSE, CAPILLARY
Glucose-Capillary: 117 mg/dL — ABNORMAL HIGH (ref 70–99)
Glucose-Capillary: 113 mg/dL — ABNORMAL HIGH (ref 70–99)
Glucose-Capillary: 167 mg/dL — ABNORMAL HIGH (ref 70–99)
Glucose-Capillary: 113 mg/dL — ABNORMAL HIGH (ref 70–99)
Glucose-Capillary: 177 mg/dL — ABNORMAL HIGH (ref 70–99)

## 2021-11-12 LAB — BRAIN NATRIURETIC PEPTIDE: B Natriuretic Peptide: 966.5 pg/mL — ABNORMAL HIGH (ref 0.0–100.0)

## 2021-11-12 LAB — PHOSPHORUS: Phosphorus: 2.4 mg/dL — ABNORMAL LOW (ref 2.5–4.6)

## 2021-11-12 LAB — MAGNESIUM: Magnesium: 2.1 mg/dL (ref 1.7–2.4)

## 2021-11-12 MED ORDER — POTASSIUM & SODIUM PHOSPHATES 280-160-250 MG PO PACK
1.0000 | PACK | Freq: Three times a day (TID) | ORAL | Status: AC
  Administered 2021-11-12 (×3): 1
  Filled 2021-11-12 (×3): qty 1

## 2021-11-12 MED ORDER — RISPERIDONE 2 MG PO TABS
2.0000 mg | ORAL_TABLET | Freq: Every day | ORAL | Status: DC
Start: 2021-11-12 — End: 2021-11-14
  Administered 2021-11-12 – 2021-11-13 (×2): 2 mg
  Filled 2021-11-12: qty 1
  Filled 2021-11-12 (×3): qty 4

## 2021-11-12 NOTE — Progress Notes (Signed)
OT Cancellation Note  Patient Details Name: [name] MRN: [id number] DOB: 08/11/1955   Cancelled Treatment:    Reason Eval/Treat Not Completed: Patient not medically ready. Pt with increased agitation with SBT requiring sedation. OT will follow back for treatment as able.  Milanie Rosenfield Elane Nihira Puello 11/12/2021, 9:44 AM

## 2021-11-12 NOTE — Progress Notes (Signed)
NAME:  [name], MRN:  [id number], DOB:  08-21-1955, LOS: 4 ADMISSION DATE:  24-Nov-2021, CONSULTATION DATE:  11/09/21 REFERRING MD:  [facility] CHIEF COMPLAINT:  Respiratory distress   History of Present Illness:  66 year old male active smoker with PMHx as noted below admitted for TIA secondary to bilateral ICA stenosis. Initially presened to [facility] with slurred speech and R>L sided weakness. Code stroke called. Also concerned for NSTEMI so transferred to [facility]. Per bedside RN and Neurology note today he has returned to baseline and he was able to follow commands with residual dysarthria.  Overnight he had choked on dinner. Bedside RN suctioned and some food removed. He was originally on room air and desatted requiring NRB and in distress. Previously able to follow commands and mainly dysarthric but now confused and in respiratory distress. CXR with pulmonary edema. Primary team contacted family and family reversed code status to full code. 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 Transferred to [facility] 5/28 - Returned to baseline during day except for dysarthria. Started on dysphagia diet, aspirated while eating with subsequent respiratory distress and required intubation 5/30 issues with agitation and sedation 5/31 Initially calmer this morning on just precedex, became agitated during SBT, respiratory culture growing H. Influenza, remains on Unasyn  Interim History / Subjective:   No overnight events, Fentanyl weaned off Febrile this AM, blood cultures pending, respiratory culture growing H. flu 430cc UOP, foley placed  Objective   Blood pressure 104/64, pulse 85, temperature (!) 102.1 F (38.9 C), temperature source Oral, resp. rate (!) 22,  height  (1.702 m), weight 57.3 kg, SpO2 100 %.    Vent Mode: PSV FiO2 (%):  [40 %-50 %] 40 % Set Rate:  [16 bmp] 16 bmp Vt Set:  [530 mL] 530 mL PEEP:  [5 cmH20] 5 cmH20 Pressure Support:  [10 cmH20] 10 cmH20 Plateau Pressure:  [17 cmH20-22 cmH20] 18 cmH20   Intake/Output Summary (Last 24 hours) at 11/12/2021 0747 Last data filed at 11/12/2021 0600 Gross per 24 hour  Intake 2654.58 ml  Output 610 ml  Net 2044.58 ml    Filed Weights   11/10/21 0456 11/11/21 0500 11/12/21 0500  Weight: 53.3 kg 55.1 kg 57.3 kg    General:  elderly M, critically ill appearing, ventilated and sedated HEENT: MM pink/moist, sclera anicteric, pupils equal Neuro: examined on precedex, initially resting comfortably and would open eyes to command, later became agitated, spontaneously moving all extremities CV: s1s2 tachycardic, regular, no m/r/g PULM:  mechanically ventilated with rhonchi in the bilateral bases, synchronous with vent, minimal vent setting with pressures at goal GI: soft, non-distended Extremities: warm/dry, no edema  Skin: no rashes or lesions    Labs reviewed CBC 13.4 (12.7 yesterday) Glu 115 CXR-worsening retrocardiac opacity  Resolved Hospital Problem list   Nosebleed following NT suctioning on heparin  Assessment & Plan:   Acute hypoxemic respiratory failure secondary to aspiration, H. Influenza Pneumonia and Pulmonary edema Acute encephalopathy secondary to above in setting of critical illness Improved mental status initially this morning on Precedex, then intermittently agitated requiring prn versed -tolerated PSV for a short time -respiratory culture growing H. Flu, febrile with worsening CXR -continue Precedex, attempt SBT as mental status allows --Maintain full vent  support with SAT/SBT as tolerated -titrate Vent setting to maintain SpO2 greater than or equal to 90%. -HOB elevated 30 degrees. -Plateau pressures less than 30 cm H20.  -Follow chest x-ray, ABG prn.    -Bronchial hygiene and RT/bronchodilator protocol. -continue Unasyn started 5/28   Septic shock from H. Flu pneumonia Levophed requirement today -Continue norepinephrine infusion, goal SBP 130-160 per neuro -discussed with cardiology, appearance of MV mass would be atypical for emboli, low suspicion for endocarditis. Will follow blood cultures.  If were to result positive then consider empiric treatment and further discussion of risk/benefit of TEE  TIA likely secondary to Bilateral carotid stenosis HTN Appreciate vascular surgery input -SBP goal 130-160 -seen by vascular surgery, thought likely not a good candidate for vascular procedure at this time  -continue Asa and Plavix   NSTEMI CAD HLD MV vegetation vs mass Plan: -Appreciate cardiology input -TEE once stabilized  -Continue dual antiplatelet therapy, heparin infusion stopped after discussion with neuro and cards -Statin  OSA COPD On CPAP at home -Remains intubated at this time  Hx TBI with associated seizure EEG This study is suggestive of cortical dysfunction arising from left temporal region as well as mild diffuse encephalopathy, nonspecific etiology.  No seizures or epileptiform discharges -Supportive care -seizure hx remote, not on anti-epileptics  Anxiety Plan: -SSRI  Protein calorie malnutrition POA -continue TF    Best Practice (right click and "Reselect all SmartList Selections" daily)   Diet/type: NPO DVT prophylaxis: systemic heparin GI prophylaxis: PPI Lines: N/A Foley:  N/A Code Status:  full code Family reversed DNR prior to transfer Last date of multidisciplinary goals of care discussion [5/28 with [facility]]  Will attempt to update family again today, unable to reach sister yesterday  Labs   CBC: Recent Labs  Lab 11/01/2021 1149 10/13/2021 1157 10/14/2021 1445 11/09/21 0624 11/10/21 0013 11/10/21 0133 11/11/21 0254 11/12/21 0356  WBC 14.5*  --  14.3* 11.8*  --  17.5* 12.7* 13.4*   NEUTROABS 11.9*  --   --   --   --  14.9* 9.4* 10.4*  HGB 12.6*   < > 11.8* 10.9* 10.9* 10.9* 10.6* 10.0*  HCT 38.3*   < > 36.1* 33.7* 32.0* 32.2* 31.5* 29.8*  MCV 101.6*  --  102.0* 101.5*  --  100.6* 101.6* 101.0*  PLT 209  --  199 189  --  204 171 160   < > = values in this interval not displayed.     Basic Metabolic Panel: Recent Labs  Lab 11/09/21 0624 11/10/21 0013 11/10/21 0133 11/10/21 2248 11/11/21 0254 11/11/21 1334 11/12/21 0356  NA 136 136 136 137 134*  --  136  K 3.7 4.3 4.0 3.8 3.7  --  3.9  CL 103  --  106 106 103  --  106  CO2 23  --  23 25 24   --  27  GLUCOSE 88  --  189* 127* 128*  --  115*  BUN 17  --  18 17 17   --  17  CREATININE 0.80  --  0.97 0.69 0.70  --  0.70  CALCIUM 8.5*  --  7.8* 7.9* 7.9*  --  7.8*  MG 2.0  --  2.1 1.9 1.9  --  2.1  PHOS  --   --   --   --   --  2.9 2.4*    GFR: Estimated Creatinine Clearance: 72.6 mL/min (by C-G formula based on SCr of 0.7 mg/dL). Recent Labs  Lab 11/09/21 0624 11/10/21 0133 11/11/21 0254 11/12/21 0356  WBC 11.8* 17.5* 12.7* 13.4*  LATICACIDVEN  --  1.1  --   --      Liver Function Tests: Recent Labs  Lab 11-27-21 1149 11/09/21 0624 11/10/21 0133 11/11/21 0254 11/12/21 0356  AST 32 29 44* 39 46*  ALT 22 19 22 22 24   ALKPHOS 69 56 62 62 76  BILITOT 1.0 1.4* 1.0 0.8 0.9  PROT 7.2 5.7* 5.4* 5.2* 4.8*  ALBUMIN 3.9 2.9* 2.5* 2.3* 2.0*    No results for input(s): LIPASE, AMYLASE in the last 168 hours. No results for input(s): AMMONIA in the last 168 hours.  ABG    Component Value Date/Time   PHART 7.362 11/10/2021 0013   PCO2ART 50.6 (H) 11/10/2021 0013   PO2ART 378 (H) 11/10/2021 0013   HCO3 28.8 (H) 11/10/2021 0013   TCO2 30 11/10/2021 0013   ACIDBASEDEF 0.3 01/04/2008 0320   O2SAT 100 11/10/2021 0013      Coagulation Profile: Recent Labs  Lab 11-27-2021 1149  INR 1.1     Cardiac Enzymes: No results for input(s): CKTOTAL, CKMB, CKMBINDEX, TROPONINI in the last 168  hours.  HbA1C: Hgb A1c MFr Bld  Date/Time Value Ref Range Status  11/09/2021 06:24 AM 6.2 (H) 4.8 - 5.6 % Final    Comment:    (NOTE) Pre diabetes:          5.7%-6.4%  Diabetes:              >6.4%  Glycemic control for   <7.0% adults with diabetes   02/03/2020 07:34 AM 6.1 (H) 4.8 - 5.6 % Final    Comment:    (NOTE) Pre diabetes:          5.7%-6.4%  Diabetes:              >6.4%  Glycemic control for   <7.0% adults with diabetes     CBG: Recent Labs  Lab 11/11/21 1523 11/11/21 1922 11/11/21 2326 11/12/21 0319 11/12/21 0718  GLUCAP 91 96 130* 117* 113*     Review of Systems:   Unable to obtain due to critical illness  Past Medical History:  He,  has a past medical history of Anxiety, Carotid artery disease (HCC), Cerebral aneurysm, Closed head injury, COPD (chronic obstructive pulmonary disease) (HCC), Depression, GERD (gastroesophageal reflux disease), Hyperlipidemia, Hypertension, Seizures (HCC), and Sleep apnea.   Surgical History:  History reviewed. No pertinent surgical history.   Social History:   reports that he has been smoking cigarettes. He has a 50.00 pack-year smoking history. He has never used smokeless tobacco. He reports current drug use. Drug: Marijuana. He reports that he does not drink alcohol.   Family History:  His family history includes Obesity in his sister.   Allergies Allergies  Allergen Reactions   Codeine Rash     Home Medications  Prior to Admission medications   Medication Sig Start Date End Date Taking? Authorizing Provider  albuterol (VENTOLIN HFA) 108 (90 Base) MCG/ACT inhaler 2 PUFFS EVERY 6 HOURS AS NEEDED FOR WHEEZING OR SHORTNESS OF BREATH Patient taking differently: Inhale 2 puffs into the lungs every 6 (six) hours as needed for wheezing or shortness of breath. 11/20/19  Yes Dettinger, 01/20/20, MD  aspirin 325 MG tablet Take 325 mg by mouth daily.   Yes [provider]  busPIRone (BUSPAR) 10 MG tablet TAKE  ONE TABLET THREE TIMES DAILY Patient taking differently: Take 10 mg by mouth 3 (  three) times daily. 08/26/21  Yes Dettinger, Elige RadonJoshua A, MD  ipratropium-albuterol (DUONEB) 0.5-2.5 (3) MG/3ML SOLN INHALE 3 MLS BY NEBULIZER 4 TIMES A DAY Patient taking differently: Take 3 mLs by nebulization every 6 (six) hours as needed (wheezing/shortness of breath). 09/22/21  Yes Dettinger, Elige RadonJoshua A, MD  mupirocin ointment (BACTROBAN) 2 % PLACE 1 APPLICATION INTO THE NOSE 2 TIMES A DAY Patient taking differently: Apply 1 application. topically 2 (two) times daily. PLACE 1 APPLICATION INTO THE NOSE 2 TIMES A DAY 03/13/20  Yes Dettinger, Elige RadonJoshua A, MD  MYRBETRIQ 25 MG TB24 tablet TAKE ONE TABLET ONCE DAILY Patient taking differently: Take 25 mg by mouth daily. 10/27/21  Yes Dettinger, Elige RadonJoshua A, MD  niacin (NIASPAN) 1000 MG CR tablet TAKE 1 TABLET BY MOUTH AT BEDTIME. Patient taking differently: Take 1,000 mg by mouth at bedtime. 08/26/21  Yes Dettinger, Elige RadonJoshua A, MD  Nystatin (GERHARDT'S BUTT CREAM) CREA Apply 1 application topically daily. Equal parts nystatin and hydrocortisone and zinc oxide 03/15/20  Yes Dettinger, Elige RadonJoshua A, MD  omeprazole (PRILOSEC) 40 MG capsule TAKE (1) CAPSULE DAILY Patient taking differently: Take 40 mg by mouth daily. 08/26/21  Yes Dettinger, Elige RadonJoshua A, MD  PARoxetine (PAXIL) 20 MG tablet TAKE ONE TABLET ONCE DAILY Patient taking differently: Take 20 mg by mouth daily. 08/26/21  Yes Dettinger, Elige RadonJoshua A, MD  Potassium 99 MG TABS Take 1 tablet by mouth daily.   Yes [provider]  pramipexole (MIRAPEX) 0.5 MG tablet Take 2 tablets (1 mg total) by mouth at bedtime. 09/19/20  Yes Dettinger, Elige RadonJoshua A, MD  risperiDONE (RISPERDAL) 2 MG tablet Take 1 tablet (2 mg total) by mouth at bedtime. 09/19/20  Yes Dettinger, Elige RadonJoshua A, MD  simvastatin (ZOCOR) 20 MG tablet TAKE ONE TABLET AT BEDTIME Patient taking differently: Take 20 mg by mouth daily at 6 PM. 08/26/21  Yes Dettinger, Elige RadonJoshua A, MD  thiamine 100  MG tablet Take 1 tablet (100 mg total) by mouth daily. 12/02/19  Yes Shon HaleEmokpae, Courage, MD     CRITICAL CARE Performed by: Darcella GasmanLaura R Mertice Uffelman   Total critical care time: 38 minutes  Critical care time was exclusive of separately billable procedures and treating other patients.  Critical care was necessary to treat or prevent imminent or life-threatening deterioration.  Critical care was time spent personally by me on the following activities: development of treatment plan with patient and/or surrogate as well as nursing, discussions with consultants, evaluation of patient's response to treatment, examination of patient, obtaining history from patient or surrogate, ordering and performing treatments and interventions, ordering and review of laboratory studies, ordering and review of radiographic studies, pulse oximetry and re-evaluation of patient's condition.  Darcella GasmanLaura R Peng Thorstenson, PA-C [facility] See Amion for pager If no response to pager , please call [phone] until 7pm After 7:00 pm call Elink  [phone]

## 2021-11-12 NOTE — Progress Notes (Signed)
STROKE TEAM PROGRESS NOTE   SUBJECTIVE (INTERVAL HISTORY) No family is at the bedside. Pt still on vent on sedation. Per RN, pt was wild early today, agitated and trying to get out of bed, moving all extremities. He was sedated with fentanyl and precedex. On my exam, pt not open eyes and not following commands. BP 100s better than yesterday, still on levophed.    OBJECTIVE Temp:  [98.1 F (36.7 C)-102.1 F (38.9 C)] 98.6 F (37 C) (05/31 1521) Pulse Rate:  [55-137] 70 (05/31 1600) Cardiac Rhythm: Normal sinus rhythm (05/31 1600) Resp:  [12-27] 22 (05/31 1600) BP: (86-152)/(46-73) 104/56 (05/31 1600) SpO2:  [69 %-100 %] 92 % (05/31 1600) Arterial Line BP: (91-163)/(41-84) 160/48 (05/31 1600) FiO2 (%):  [40 %-50 %] 40 % (05/31 1600) Weight:  [57.3 kg] 57.3 kg (05/31 0500)  Recent Labs  Lab 11/11/21 2326 11/12/21 0319 11/12/21 0718 11/12/21 1156 11/12/21 1520  GLUCAP 130* 117* 113* 167* 113*   Recent Labs  Lab 11/09/21 1914 11/10/21 0013 11/10/21 0133 11/10/21 2248 11/11/21 0254 11/11/21 1334 11/12/21 0356  NA 136 136 136 137 134*  --  136  K 3.7 4.3 4.0 3.8 3.7  --  3.9  CL 103  --  106 106 103  --  106  CO2 23  --  --  27  GLUCOSE 88  --  189* 127* 128*  --  115*  BUN 17  --  --  17  CREATININE 0.80  --  0.97 0.69 0.70  --  0.70  CALCIUM 8.5*  --  7.8* 7.9* 7.9*  --  7.8*  MG 2.0  --  2.1 1.9 1.9  --  2.1  PHOS  --   --   --   --   --  2.9 2.4*   Recent Labs  Lab December 02, 2021 1149 11/09/21 0624 11/10/21 0133 11/11/21 0254 11/12/21 0356  AST 32 29 44* 39 46*  ALT ALKPHOS 69 56 62 62 76  BILITOT 1.0 1.4* 1.0 0.8 0.9  PROT 7.2 5.7* 5.4* 5.2* 4.8*  ALBUMIN 3.9 2.9* 2.5* 2.3* 2.0*   Recent Labs  Lab 12-02-21 1149 02-Dec-2021 1157 December 02, 2021 1445 11/09/21 0624 11/10/21 0013 11/10/21 0133 11/11/21 0254 11/12/21 0356  WBC 14.5*  --  14.3* 11.8*  --  17.5* 12.7* 13.4*  NEUTROABS 11.9*  --   --   --   --  14.9* 9.4* 10.4*  HGB  12.6*   < > 11.8* 10.9* 10.9* 10.9* 10.6* 10.0*  HCT 38.3*   < > 36.1* 33.7* 32.0* 32.2* 31.5* 29.8*  MCV 101.6*  --  102.0* 101.5*  --  100.6* 101.6* 101.0*  PLT 209  --  199 189  --  204 171 160   < > = values in this interval not displayed.   No results for input(s): CKTOTAL, CKMB, CKMBINDEX, TROPONINI in the last 168 hours. No results for input(s): LABPROT, INR in the last 72 hours.  No results for input(s): COLORURINE, LABSPEC, PHURINE, GLUCOSEU, HGBUR, BILIRUBINUR, KETONESUR, PROTEINUR, UROBILINOGEN, NITRITE, LEUKOCYTESUR in the last 72 hours.  Invalid input(s): APPERANCEUR      Component Value Date/Time   CHOL 127 Dec 02, 2021 1148   CHOL 125 10/15/2021 1443   TRIG 34 12-02-21 1148   TRIG 101 10/03/2013 1654   HDL 64 02-Dec-2021 1148   HDL 63 10/15/2021 1443   HDL 64 10/03/2013 1654   CHOLHDL 2.0  11-10-21 1148   VLDL 7 11-10-21 1148   LDLCALC 56 11-10-2021 1148   LDLCALC 52 10/15/2021 1443   LDLCALC 45 10/03/2013 1654   Lab Results  Component Value Date   HGBA1C 6.2 (H) 11/09/2021      Component Value Date/Time   LABOPIA NONE DETECTED 11/10/21 1608   COCAINSCRNUR NONE DETECTED 2021-11-10 1608   LABBENZ NONE DETECTED 2021/11/10 1608   AMPHETMU NONE DETECTED 2021/11/10 1608   THCU NONE DETECTED 10-Nov-2021 1608   LABBARB NONE DETECTED 2021-11-10 1608    Recent Labs  Lab 2021-11-10 1149  ETH <10    I have personally reviewed the radiological images below and agree with the radiology interpretations.  CT ANGIO HEAD NECK W WO CM  Result Date: 11/09/2021 CLINICAL DATA:  Stroke workup EXAM: CT ANGIOGRAPHY HEAD AND NECK TECHNIQUE: Multidetector CT imaging of the head and neck was performed using the standard protocol during bolus administration of intravenous contrast. Multiplanar CT image reconstructions and MIPs were obtained to evaluate the vascular anatomy. Carotid stenosis measurements (when applicable) are obtained utilizing NASCET criteria, using the distal  internal carotid diameter as the denominator. RADIATION DOSE REDUCTION: This exam was performed according to the departmental dose-optimization program which includes automated exposure control, adjustment of the mA and/or kV according to patient size and/or use of iterative reconstruction technique. CONTRAST:  OMNIPAQUE IOHEXOL 350 MG/ML SOLN COMPARISON:  Stroke MRI from yesterday FINDINGS: CT HEAD FINDINGS Brain: No evidence of acute infarction, hemorrhage, hydrocephalus, extra-axial collection or mass lesion/mass effect. Chronic small vessel ischemia in the deep white matter with chronic lacunar/perforator infarcts at the left deep gray nuclei and right caudate head. Generalized cerebral volume loss. Vascular: No hyperdense vessel or unexpected calcification. Skull: Remote left-sided burr holes.  No acute or aggressive finding Sinuses: Extensive bilateral sinus opacification at the level of the maxillary and asymmetric left ethmoid sinuses. Orbits: No acute finding Review of the MIP images confirms the above findings CTA NECK FINDINGS Aortic arch: Minimal coverage, the brachiocephalic artery origin is not seen. Atherosclerosis. Right carotid system: Diffuse calcified plaque with severe bulky plaque at the bifurcation completely obscuring a patent lumen. The downstream right ICA is under filled compared to the left. Left carotid system: Calcified plaque primarily at the common carotid origin and bifurcation. A mixed density plaque at the left ICA origin has a web-like component causing over 70% stenosis. The left ECA origin is occluded with downstream reconstitution. Vertebral arteries: Calcified plaque involving both proximal subclavian arteries. Proximal right subclavian stenosis measures 60%. Calcified plaque at both V1 segments with high-grade narrowing on the left. The right vertebral artery is dominant. No dissection or beading Skeleton: Generalized cervical spine degeneration which is advanced. Other  neck: No acute finding Upper chest: Severe emphysema at the apices. Review of the MIP images confirms the above findings CTA HEAD FINDINGS Anterior circulation: Extensive calcified plaque along the bilateral carotid siphons to the degree that calcified plaque over estimates the diffuse narrowed appearance. Mild to moderate bilateral ICA stenosis is possible. No major branch occlusion, beading, or aneurysm. Bilateral atheromatous narrowing of MCA branches with moderate left M2 and right M1 stenosis see seen on coronal thick MIPS. Narrow left A1 segment which is likely both acquired and congenital. Posterior circulation: The vertebral and basilar arteries are smoothly contoured and diffusely patent. No branch occlusion, beading, or aneurysm. Venous sinuses: Unremarkable Anatomic variants: None significant Review of the MIP images confirms the above findings IMPRESSION: 1. Severe bilateral proximal ICA stenosis due  to advanced atherosclerosis. On the right a measurable channel is not visible and there is downstream underfilling. 2. 60% narrowing at the right proximal subclavian artery. The brachiocephalic origin was not covered on the scan. 3. Advanced narrowing at the origin of the non dominant left vertebral artery. 4. Moderate left M2 and right M1 segment stenoses. 5. Aortic Atherosclerosis (ICD10-I70.0) and Emphysema (ICD10-J43.9). Electronically Signed   By: Patrick Pea M.D.   On: 11/09/2021 11:47   MR BRAIN WO CONTRAST  Result Date: 11/22/2021 CLINICAL DATA:  Slurred speech bilateral weakness EXAM: MRI HEAD WITHOUT CONTRAST TECHNIQUE: Multiplanar, multiecho pulse sequences of the brain and surrounding structures were obtained without intravenous contrast. COMPARISON:  02/03/2020, correlation is also made with CT head 22-Nov-2021 FINDINGS: Evaluation is somewhat limited by motion artifact. Brain: No restricted diffusion to suggest acute or subacute infarct. No acute hemorrhage, mass, mass effect, or midline  shift. No hydrocephalus or extra-axial collection. Cerebral volume loss is advanced for age. Confluent T2 hyperintense signal in the periventricular white matter and pons, likely the sequela of moderate to severe chronic small vessel ischemic disease. Redemonstrated lacunar infarcts in the thalami and basal ganglia. Redemonstrated hemosiderin deposition in the left midbrain, likely sequela of prior hypertensive microhemorrhage. Vascular: Normal arterial flow voids. Skull and upper cervical spine: Normal marrow signal. Sinuses/Orbits: Chronic bilateral maxillary sinusitis. Mucosal thickening in the ethmoid air cells. No acute finding in the orbits. Other: Trace fluid in left mastoid tip. IMPRESSION: No acute intracranial process. No evidence of acute or subacute infarct. Electronically Signed   By: Wiliam Ke M.D.   On: 2021/11/22 21:21   DG CHEST PORT 1 VIEW  Result Date: 11/12/2021 CLINICAL DATA:  COPD and fever. EXAM: PORTABLE CHEST 1 VIEW COMPARISON:  11/10/2021 FINDINGS: Endotracheal tube terminates 4.3 cm above carina. Nasogastric tube extends beyond the inferior aspect of the film. Left internal jugular line tip at mid SVC. Mild cardiomegaly. Small bilateral pleural effusions. No pneumothorax. Moderate interstitial edema, increased. Left greater than right lower lung predominant airspace disease, increased. IMPRESSION: Overall worsened aeration, with progressive congestive heart failure and small bilateral pleural effusions. Concurrent progressive left-greater-than-right airspace disease. Cannot exclude pneumonia, especially in the left mid and lower lung. Electronically Signed   By: [name] M.D.   On: 11/12/2021 08:39   DG CHEST PORT 1 VIEW  Result Date: 11/10/2021 CLINICAL DATA:  Central line placement. EXAM: PORTABLE CHEST 1 VIEW COMPARISON:  Chest x-ray 11/09/2021. FINDINGS: Endotracheal tube tip is 5.4 cm above the carina. Enteric tube extends below the level of the diaphragm. Left sided  central venous catheter tip projects over the SVC. The cardiomediastinal silhouette is stable and within normal limits. There are hazy patchy opacities in the left mid and lower lung, unchanged. There is central pulmonary vascular congestion, unchanged. There is no pleural effusion or pneumothorax visualized. IMPRESSION: 1. New left-sided central venous catheter tip projects over the SVC. 2. Stable left mid and lower lung airspace disease with central pulmonary vascular congestion. Electronically Signed   By: [name] M.D.   On: 11/10/2021 01:25   DG CHEST PORT 1 VIEW  Result Date: 11/09/2021 CLINICAL DATA:  Intubation, NG tube placement. EXAM: PORTABLE CHEST 1 VIEW COMPARISON:  11/09/2021. FINDINGS: The heart size and mediastinal contours are stable. The pulmonary vasculature is distended. Atherosclerotic calcification of the aorta is noted. Interstitial prominence and patchy perihilar airspace disease is noted bilaterally, increased from the prior exam. No effusion or pneumothorax. The endotracheal tube terminates 5.2 cm above  the carina. There is gaseous distention of the stomach with an enteric tube terminating in the distal stomach. No acute osseous abnormality. IMPRESSION: 1. Mildly distended pulmonary vasculature. 2. Interstitial prominence bilaterally with patchy perihilar airspace disease, increased from the prior exam, possible edema or infiltrate. 3. Support apparatus as detailed above. Electronically Signed   By: [name] M.D.   On: 11/09/2021 22:31   DG CHEST PORT 1 VIEW  Result Date: 11/09/2021 CLINICAL DATA:  Respiratory distress. EXAM: PORTABLE CHEST 1 VIEW COMPARISON:  11/09/2021. FINDINGS: The heart size and mediastinal contours are stable. The pulmonary vasculature is mildly distended. Increased interstitial and perihilar opacities are noted bilaterally. No effusion or pneumothorax. No acute osseous abnormality. IMPRESSION: 1. Mildly distended pulmonary vasculature. 2. Increased  interstitial and perihilar airspace opacities suggesting edema, less likely infiltrate. Electronically Signed   By: [name] M.D.   On: 11/09/2021 20:51   DG Chest Port 1 View  Result Date: 11/09/2021 CLINICAL DATA:  Shortness of breath. EXAM: PORTABLE CHEST 1 VIEW COMPARISON:  12/03/2019 FINDINGS: 0558 hours. Rotated film. The lungs are clear without focal pneumonia, edema, pneumothorax or pleural effusion. Interstitial markings are diffusely coarsened with chronic features. Cardiopericardial silhouette is at upper limits of normal for size. Bones are diffusely demineralized. Telemetry leads overlie the chest. IMPRESSION: Chronic interstitial coarsening. No acute cardiopulmonary findings. Electronically Signed   By: [name] M.D.   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  EEG to evaluate for seizure. Level of alertness: Awake, asleep AEDs during EEG study: None Technical aspects: This EEG study was done with scalp electrodes positioned according to the 10-20 International system of electrode placement. Electrical activity was acquired at a sampling rate of  and reviewed with a high frequency filter of  and a low frequency filter of . EEG data were recorded continuously and digitally stored. Description: The posterior dominant rhythm consists of 8 Hz activity of moderate voltage (25-35 uV) seen predominantly in posterior head regions, symmetric and reactive to eye opening and eye closing. Sleep was characterized by vertex waves, sleep spindles (12 to 14 Hz), maximal frontocentral region. EEG showed intermittent generalized and maximal left temporal  region 3 to 6 Hz theta-delta slowing. Hyperventilation and photic stimulation were not performed.   ABNORMALITY - Intermittent slow, generalized and maximal left temporal region IMPRESSION: This study is suggestive of cortical dysfunction arising from left temporal region as well as mild diffuse encephalopathy, nonspecific etiology.  No seizures or epileptiform discharges were seen throughout the recording. [name]   ECHOCARDIOGRAM COMPLETE  Result Date: 11/09/2021    ECHOCARDIOGRAM REPORT   Patient Name:   TEDRICK PORT Date of Exam: 11/09/2021 Medical Rec #:  [id number]           Height:       67.0 in Accession #:    [id number]          Weight:       126.3 lb Date of Birth:  [birth year]           BSA:          1.664 m Patient Age:    7 years  BP:           119/53 mmHg Patient Gender: M                   HR:           79 bpm. Exam Location:  Inpatient Procedure: 2D Echo, Cardiac Doppler and Color Doppler Indications:    Stroke  History:        Patient has prior history of Echocardiogram examinations, most                 recent 02/03/2020. CAD, TIA and COPD; Risk Factors:Current                 Smoker, Hypertension and Dyslipidemia.  Sonographer:    [name] RDCS (AE) Referring Phys: [id number] [facility] IMPRESSIONS  1. Left ventricular ejection fraction, by estimation, is 60 to 65%. The left ventricle has normal function. The left ventricle has no regional wall motion abnormalities. Left ventricular diastolic parameters were normal.  2. Right ventricular systolic function is normal. The right ventricular size is normal.  3. Only in parasternal long axis views ? mass on atrial surface of posterior leaflet may be shadowing artifact but in setting of TIA consider f/u TEE if clinically indicated . The mitral valve is abnormal. Trivial mitral valve regurgitation. No evidence  of mitral stenosis.  4. The aortic valve is tricuspid. There is mild calcification of the aortic valve. There is  mild thickening of the aortic valve. Aortic valve regurgitation is not visualized. Aortic valve sclerosis is present, with no evidence of aortic valve stenosis.  5. The inferior vena cava is dilated in size with >50% respiratory variability, suggesting right atrial pressure of 8 mmHg. FINDINGS  Left Ventricle: Left ventricular ejection fraction, by estimation, is 60 to 65%. The left ventricle has normal function. The left ventricle has no regional wall motion abnormalities. The left ventricular internal cavity size was normal in size. There is  no left ventricular hypertrophy. Left ventricular diastolic parameters were normal. Right Ventricle: The right ventricular size is normal. No increase in right ventricular wall thickness. Right ventricular systolic function is normal. Left Atrium: Left atrial size was normal in size. Right Atrium: Right atrial size was normal in size. Pericardium: There is no evidence of pericardial effusion. Mitral Valve: Only in parasternal long axis views ? mass on atrial surface of posterior leaflet may be shadowing artifact but in setting of TIA consider f/u TEE if clinically indicated. The mitral valve is abnormal. There is mild thickening of the mitral  valve leaflet(s). Trivial mitral valve regurgitation. No evidence of mitral valve stenosis. Tricuspid Valve: The tricuspid valve is normal in structure. Tricuspid valve regurgitation is not demonstrated. No evidence of tricuspid stenosis. Aortic Valve: The aortic valve is tricuspid. There is mild calcification of the aortic valve. There is mild thickening of the aortic valve. Aortic valve regurgitation is not visualized. Aortic valve sclerosis is present, with no evidence of aortic valve stenosis. Aortic valve mean gradient measures 2.0 mmHg. Aortic valve peak gradient measures 4.2 mmHg. Aortic valve area, by VTI measures 3.33 cm. Pulmonic Valve: The pulmonic valve was normal in structure. Pulmonic valve regurgitation is not  visualized. No evidence of pulmonic stenosis. Aorta: The aortic root is normal in size and structure. Venous: The inferior vena cava is dilated in size with greater than 50% respiratory variability, suggesting right atrial pressure of 8 mmHg. IAS/Shunts: No atrial level shunt detected by color flow Doppler.  LEFT VENTRICLE PLAX 2D LVIDd:         5.10 cm   Diastology LVIDs:         3.80 cm   LV e' medial:    7.83 cm/s LV PW:         0.90 cm   LV E/e' medial:  12.5 LV IVS:        1.20 cm   LV e' lateral:   8.16 cm/s LVOT diam:     2.20 cm   LV E/e' lateral: 12.0 LV SV:         62 LV SV Index:   37 LVOT Area:     3.80 cm  RIGHT VENTRICLE             IVC RV Basal diam:  2.90 cm     IVC diam: 2.60 cm RV S prime:     12.50 cm/s TAPSE (M-mode): 2.2 cm LEFT ATRIUM             Index        RIGHT ATRIUM           Index LA diam:        3.20 cm 1.92 cm/m   RA Area:     18.40 cm LA Vol (A2C):   49.8 ml 29.94 ml/m  RA Volume:   43.60 ml  26.21 ml/m LA Vol (A4C):   52.7 ml 31.68 ml/m LA Biplane Vol: 54.1 ml 32.52 ml/m  AORTIC VALVE AV Area (Vmax):    3.54 cm AV Area (Vmean):   3.37 cm AV Area (VTI):     3.33 cm AV Vmax:           102.00 cm/s AV Vmean:          65.600 cm/s AV VTI:            0.186 m AV Peak Grad:      4.2 mmHg AV Mean Grad:      2.0 mmHg LVOT Vmax:         94.90 cm/s LVOT Vmean:        58.200 cm/s LVOT VTI:          0.163 m LVOT/AV VTI ratio: 0.88  AORTA Ao Root diam: 3.20 cm Ao Asc diam:  2.70 cm MITRAL VALVE MV Area (PHT): 5.62 cm    SHUNTS MV Decel Time: 135 msec    Systemic VTI:  0.16 m MV E velocity: 98.00 cm/s  Systemic Diam: 2.20 cm MV A velocity: 56.10 cm/s MV E/A ratio:  1.75 Patrick Haws MD Electronically signed by Patrick Haws MD Signature Date/Time: 11/09/2021/2:11:08 PM    Final    CT HEAD CODE STROKE WO CONTRAST  Result Date: November 14, 2021 CLINICAL DATA:  Code stroke. Stroke suspected. Right-sided weakness for 23 hours EXAM: CT HEAD WITHOUT CONTRAST TECHNIQUE: Contiguous axial images were  obtained from the base of the skull through the vertex without intravenous contrast. RADIATION DOSE REDUCTION: This exam was performed according to the departmental dose-optimization program which includes automated exposure control, adjustment of the mA and/or kV according to patient size and/or use of iterative reconstruction technique. COMPARISON:  09/14/2021 FINDINGS: Brain: No evidence of acute infarction, hemorrhage, hydrocephalus, extra-axial collection or mass lesion/mass effect. Multiple chronic lacunar infarcts at the bilateral deep gray nuclei, especially the bilateral thalamus. 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 Premature chronic small vessel ischemia. Confluence clinic gliosis in the cerebral white matter. Vascular: No hyperdense vessel or unexpected  calcification. Skull: Normal. Negative for fracture or focal lesion. Prior burr holes on the left. Sinuses/Orbits: Chronic bilateral maxillary sinusitis with left more than right opacification and sclerotic wall thickening. ASPECTS [facility]([place] Stroke Program Early CT Score) - Ganglionic level infarction (caudate, lentiform nuclei, internal capsule, insula, M1-M3 cortex): 7 - Supraganglionic infarction (M4-M6 cortex): 3 Total score (0-10 with 10 being normal): 10 IMPRESSION: 1. No acute finding. 2. Advanced chronic small vessel ischemia. Generalized cerebral atrophy. 3. Chronic sinusitis. Electronically Signed   By: [name] M.D.   On: 11/10/2021 12:04   VAS US CAROTID  Result Date: 11/10/2021 Carotid Arterial Duplex Study Patient Name:  [name]  Date of Exam:   11/09/2021 Medical Rec #: [id number]            Accession #:    [id number] Date of Birth: 02/23/1956            Patient Gender: M Patient Age:   [age] years [facility] Procedure:      VAS US CAROTID Referring Phys: Patrick LivingsBRANDON CAIN --------------------------------------------------------------------------------  Indications:        CVA, Speech disturbance and Weakness. NSTEMI. Risk Factors:      Hypertension, hyperlipidemia, coronary artery disease, prior                    CVA and TBI. Other Factors:     CTA of neck done 11/09/21 indicated "Severe bilateral proximal                    ICA stenosis due to advanced atherosclerosis. On the right a                    measurable channel is not visible and there is downstream                    underfilling." Limitations        Today's exam was limited due to heavy calcification and the                    resulting shadowing and coughing and shaking of legs. Comparison Study:  [facility] carotid duplex [facility] 11/14/19 indicated                    R:80-99% ICA stenosis, highest end of range and L:60-79% ICA                    stenosis, highest end of range Performing Technologist: Sherren Kernsandace Kanady RVS  Examination Guidelines: A complete evaluation includes B-mode imaging, spectral Doppler, color Doppler, and power Doppler as needed of all accessible portions of each vessel. Bilateral testing is considered an integral part of a complete examination. Limited examinations for reoccurring indications may be performed as noted.  Right Carotid Findings: +----------+--------+--------+--------+----------------------+---------+           PSV cm/sEDV cm/sStenosisPlaque Description    Comments  +----------+--------+--------+--------+----------------------+---------+ CCA Prox  50      9               calcific              Shadowing +----------+--------+--------+--------+----------------------+---------+ CCA Distal24      6               calcific              Shadowing +----------+--------+--------+--------+----------------------+---------+ ICA Prox  453  74              calcific and irregularShadowing +----------+--------+--------+--------+----------------------+---------+ ECA       95      11                                               +----------+--------+--------+--------+----------------------+---------+ +----------+--------+-------+--------+-------------------+           PSV cm/sEDV cmsDescribeArm Pressure (mmHG) +----------+--------+-------+--------+-------------------+ Subclavian110                                        +----------+--------+-------+--------+-------------------+ +---------+--------+---+--------+--+ VertebralPSV cm/s214EDV cm/s49 +---------+--------+---+--------+--+  Left Carotid Findings: +----------+-------+--------+--------+--------------------+--------------------+           PSV    EDV cm/sStenosisPlaque Description  Comments                       cm/s                                                            +----------+-------+--------+--------+--------------------+--------------------+ CCA Prox  135    27              heterogenous                             +----------+-------+--------+--------+--------------------+--------------------+ CCA Distal126    21              heterogenous                             +----------+-------+--------+--------+--------------------+--------------------+ ICA Prox  581    132     80-99%  calcific and                                                              irregular                                +----------+-------+--------+--------+--------------------+--------------------+ ICA Mid   196    47                                                       +----------+-------+--------+--------+--------------------+--------------------+ ICA Distal81     20                                                       +----------+-------+--------+--------+--------------------+--------------------+ ECA       263    35  mid to distal                                                             portion               +----------+-------+--------+--------+--------------------+--------------------+ +----------+--------+--------+--------+-------------------+           PSV cm/sEDV cm/sDescribeArm Pressure (mmHG) +----------+--------+--------+--------+-------------------+ [id number]                                          +----------+--------+--------+--------+-------------------+ +---------+--------+--+--------+-+ VertebralPSV cm/s21EDV cm/s8 +---------+--------+--+--------+-+   Summary: Right Carotid: Near occlusion of the proximal right ICA. Left Carotid: Velocities in the left ICA are consistent with a 80-99% stenosis. Vertebrals:  Bilateral vertebral arteries demonstrate antegrade flow. Subclavians: Normal flow hemodynamics were seen in bilateral subclavian              arteries. *See table(s) above for measurements and observations.  Electronically signed by Patrick Livings MD on 11/10/2021 at 10:56:26 AM.    Final      PHYSICAL EXAM  Temp:  [98.1 F (36.7 C)-102.1 F (38.9 C)] 98.6 F (37 C) (05/31 1521) Pulse Rate:  [55-137] 70 (05/31 1600) Resp:  [12-27] 22 (05/31 1600) BP: (86-152)/(46-73) 104/56 (05/31 1600) SpO2:  [69 %-100 %] 92 % (05/31 1600) Arterial Line BP: (91-163)/(41-84) 160/48 (05/31 1600) FiO2 (%):  [40 %-50 %] 40 % (05/31 1600) Weight:  [57.3 kg] 57.3 kg (05/31 0500)  General - Well nourished, well developed, intubated on precedex.  Ophthalmologic - fundi not visualized due to noncooperation.  Cardiovascular - Regular rate and rhythm.  Neuro - intubated on precedex, eyes closed, not open on voice, not able to follow simple commands. Eyes in mid position, not blinking to visual threat, no tracking, pupils 1.72mm, sluggish to light. No doll's eyes. Corneal reflex weakly present left stronger than right, gag and cough strongly present. Breathing over the vent.  Facial symmetry not able to test due to ET tube.  Mild movement BLEs on pain, no movement of BUEs even on pain.  Sensation, coordination and gait not tested.   ASSESSMENT/PLAN [name] is a 66 y.o. male with history of TBI with chronic dysarthria, seizure, hypertension, hyperlipidemia, CAD, OSA, carotid stenosis bilaterally admitted for worsening dysarthria, bilateral leg weakness. No tPA given due to outside window.    TIA likely due to bilateral ICA stenosis in the setting of soft BP CT no acute abnormality  MRI no acute infarct CT head and neck severe bilateral proximal ICA stenosis, right more than left, and decreased downstream flow on the right.  Bilateral ICA siphon moderate to severe stenosis.  Moderate left M2 and right M1 segment stenosis.  60% right proximal subclavian artery stenosis.  Left VA origin stenosis. Carotid Doppler Right near occlusion and left ICA 80-99% stenosis 2D Echo EF 60 to 65%, ? MV mass on the atrial surface EEG no seizure, cortical dysfunction arising from left temporal region as well as mild diffuse encephalopathy LDL 56 HgbA1c 6.2 Heparin IV for VTE prophylaxis aspirin 325 mg daily prior to admission, now on ASA 81 and plavix 75. 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  EEG negative.  CT/MRI/MRA negative.  However, carotid Doppler showed bilateral ICA more than 70% stenosis, right more than left.  EF 55 to 60%.  Discharged on aspirin 325.  Bilateral carotid stenosis 01/2020 carotid Doppler showed bilateral ICA > 70% stenosis, right more than left CT head and neck severe bilateral proximal ICA stenosis, right more than left, and decreased downstream flow on the right.  Carotid Doppler right ICA near occlusion, left ICA 80 to 99% stenosis Vascular surgery Dr.  Randie Heinz on board Given current comorbidities, VVS felt pt not a good candidate for carotid revascularization at this time  ? Mitral valve mass/vegetation Non-STEMI TTE showed ? MV mass/vegetation on the atrial surface Cardiology on board TEE once stable is recommended by cardiology  High risk for cardiovascular procedure at the time On ASA, Plavix  Hypotension History of hypertension Low BP after intubation On Levophed drip Off Neo Avoid low BP given bilateral extracranial carotid stenosis and intracranial stenosis BP goal 120-1 60 before carotid revascularization  Hyperlipidemia Home meds: Zocor 20 LDL 56, goal < 70 Now on Zocor 20 given LDL at goal Continue statin at discharge  Other Stroke Risk Factors Advanced age Obstructive sleep apnea, on CPAP at home Coronary artery disease  Other Active Problems History of TBI with residual of severe dysarthria  Hospital day # 4   Marvel Plan, MD PhD Stroke Neurology 11/12/2021 5:15 PM    To contact Stroke Continuity provider, please refer to [email or web address]. After hours, contact General Neurology

## 2021-11-12 NOTE — Progress Notes (Signed)
PT Cancellation Note  Patient Details Name: [name] MRN: [id number] DOB: September 17, 1955   Cancelled Treatment:    Reason Eval/Treat Not Completed: (P) Medical issues which prohibited therapy Pt with increased agitation with SBT requiring sedation. PT will follow back for treatment as able.  Aarik Blank B. Beverely Risen PT, DPT Acute Rehabilitation Services Please use secure chat or  Call Office [phone]    Elon Alas [facility] 11/12/2021, 8:57 AM

## 2021-11-12 NOTE — Plan of Care (Signed)
  Problem: Education: Goal: Knowledge of General Education information will improve Description: Including pain rating scale, medication(s)/side effects and non-pharmacologic comfort measures Outcome: Progressing   Problem: Health Behavior/Discharge Planning: Goal: Ability to manage health-related needs will improve Outcome: Progressing   Problem: Clinical Measurements: Goal: Ability to maintain clinical measurements within normal limits will improve Outcome: Progressing Goal: Will remain free from infection Outcome: Progressing Goal: Diagnostic test results will improve Outcome: Progressing Goal: Respiratory complications will improve Outcome: Progressing Goal: Cardiovascular complication will be avoided Outcome: Progressing   Problem: Activity: Goal: Risk for activity intolerance will decrease Outcome: Progressing   Problem: Nutrition: Goal: Adequate nutrition will be maintained Outcome: Progressing   Problem: Coping: Goal: Level of anxiety will decrease Outcome: Progressing   Problem: Elimination: Goal: Will not experience complications related to bowel motility Outcome: Progressing Goal: Will not experience complications related to urinary retention Outcome: Progressing   Problem: Pain Managment: Goal: General experience of comfort will improve Outcome: Progressing   Problem: Safety: Goal: Ability to remain free from injury will improve Outcome: Progressing   Problem: Skin Integrity: Goal: Risk for impaired skin integrity will decrease Outcome: Progressing   Problem: Education: Goal: Knowledge of disease or condition will improve Outcome: Progressing Goal: Knowledge of secondary prevention will improve (SELECT ALL) Outcome: Progressing Goal: Knowledge of patient specific risk factors will improve (INDIVIDUALIZE FOR PATIENT) Outcome: Progressing Goal: Individualized Educational Video(s) Outcome: Progressing   Problem: Coping: Goal: Will verbalize  positive feelings about self Outcome: Progressing Goal: Will identify appropriate support needs Outcome: Progressing   Problem: Health Behavior/Discharge Planning: Goal: Ability to manage health-related needs will improve Outcome: Progressing   Problem: Self-Care: Goal: Ability to participate in self-care as condition permits will improve Outcome: Progressing Goal: Verbalization of feelings and concerns over difficulty with self-care will improve Outcome: Progressing Goal: Ability to communicate needs accurately will improve Outcome: Progressing   Problem: Nutrition: Goal: Risk of aspiration will decrease Outcome: Progressing Goal: Dietary intake will improve Outcome: Progressing   Problem: Intracerebral Hemorrhage Tissue Perfusion: Goal: Complications of Intracerebral Hemorrhage will be minimized Outcome: Progressing   Problem: Ischemic Stroke/TIA Tissue Perfusion: Goal: Complications of ischemic stroke/TIA will be minimized Outcome: Progressing   Problem: Safety: Goal: Non-violent Restraint(s) Outcome: Progressing   

## 2021-11-13 DIAGNOSIS — J690 Pneumonitis due to inhalation of food and vomit: Secondary | ICD-10-CM | POA: Diagnosis not present

## 2021-11-13 DIAGNOSIS — I214 Non-ST elevation (NSTEMI) myocardial infarction: Secondary | ICD-10-CM | POA: Diagnosis not present

## 2021-11-13 DIAGNOSIS — E782 Mixed hyperlipidemia: Secondary | ICD-10-CM | POA: Diagnosis not present

## 2021-11-13 DIAGNOSIS — I639 Cerebral infarction, unspecified: Secondary | ICD-10-CM | POA: Diagnosis not present

## 2021-11-13 LAB — COMPREHENSIVE METABOLIC PANEL
ALT: 24 U/L (ref 0–44)
AST: 35 U/L (ref 15–41)
Albumin: 1.8 g/dL — ABNORMAL LOW (ref 3.5–5.0)
Alkaline Phosphatase: 90 U/L (ref 38–126)
Anion gap: 6 (ref 5–15)
BUN: 18 mg/dL (ref 8–23)
CO2: 29 mmol/L (ref 22–32)
Calcium: 7.9 mg/dL — ABNORMAL LOW (ref 8.9–10.3)
Chloride: 104 mmol/L (ref 98–111)
Creatinine, Ser: 0.65 mg/dL (ref 0.61–1.24)
GFR, Estimated: >60 mL/min (ref >60)
Glucose, Bld: 154 mg/dL — ABNORMAL HIGH (ref 70–99)
Potassium: 4 mmol/L (ref 3.5–5.1)
Sodium: 139 mmol/L (ref 135–145)
Total Bilirubin: 0.5 mg/dL (ref 0.3–1.2)
Total Protein: 4.8 g/dL — ABNORMAL LOW (ref 6.5–8.1)

## 2021-11-13 LAB — MAGNESIUM: Magnesium: 2.1 mg/dL (ref 1.7–2.4)

## 2021-11-13 LAB — GLUCOSE, CAPILLARY
Glucose-Capillary: 134 mg/dL — ABNORMAL HIGH (ref 70–99)
Glucose-Capillary: 135 mg/dL — ABNORMAL HIGH (ref 70–99)
Glucose-Capillary: 138 mg/dL — ABNORMAL HIGH (ref 70–99)
Glucose-Capillary: 141 mg/dL — ABNORMAL HIGH (ref 70–99)
Glucose-Capillary: 145 mg/dL — ABNORMAL HIGH (ref 70–99)
Glucose-Capillary: 160 mg/dL — ABNORMAL HIGH (ref 70–99)

## 2021-11-13 LAB — BRAIN NATRIURETIC PEPTIDE: B Natriuretic Peptide: 1303.2 pg/mL — ABNORMAL HIGH (ref 0.0–100.0)

## 2021-11-13 LAB — CBC WITH DIFFERENTIAL/PLATELET
Abs Immature Granulocytes: 0.06 10*3/uL (ref 0.00–0.07)
Basophils Absolute: 0.1 10*3/uL (ref 0.0–0.1)
Basophils Relative: 0 %
Eosinophils Absolute: 0.1 10*3/uL (ref 0.0–0.5)
Eosinophils Relative: 1 %
HCT: 28.6 % — ABNORMAL LOW (ref 39.0–52.0)
Hemoglobin: 9.5 g/dL — ABNORMAL LOW (ref 13.0–17.0)
Immature Granulocytes: 1 %
Lymphocytes Relative: 10 %
Lymphs Abs: 1.1 10*3/uL (ref 0.7–4.0)
MCH: 33.5 pg (ref 26.0–34.0)
MCHC: 33.2 g/dL (ref 30.0–36.0)
MCV: 100.7 fL — ABNORMAL HIGH (ref 80.0–100.0)
Monocytes Absolute: 1.2 10*3/uL — ABNORMAL HIGH (ref 0.1–1.0)
Monocytes Relative: 10 %
Neutro Abs: 9 10*3/uL — ABNORMAL HIGH (ref 1.7–7.7)
Neutrophils Relative %: 78 %
Platelets: 162 10*3/uL (ref 150–400)
RBC: 2.84 MIL/uL — ABNORMAL LOW (ref 4.22–5.81)
RDW: 12.6 % (ref 11.5–15.5)
WBC: 11.5 10*3/uL — ABNORMAL HIGH (ref 4.0–10.5)
nRBC: 0 % (ref 0.0–0.2)

## 2021-11-13 MED ORDER — FENTANYL CITRATE (PF) 100 MCG/2ML IJ SOLN
25.0000 ug | INTRAMUSCULAR | Status: DC | PRN
  Administered 2021-11-13: 50 ug via INTRAVENOUS
  Administered 2021-11-14: 100 ug via INTRAVENOUS
  Filled 2021-11-13 (×2): qty 2

## 2021-11-13 MED ORDER — BETHANECHOL CHLORIDE 10 MG PO TABS
10.0000 mg | ORAL_TABLET | Freq: Three times a day (TID) | ORAL | Status: DC
  Administered 2021-11-13 – 2021-11-14 (×4): 10 mg
  Filled 2021-11-13 (×3): qty 1

## 2021-11-13 MED ORDER — BUSPIRONE HCL 10 MG PO TABS
10.0000 mg | ORAL_TABLET | Freq: Three times a day (TID) | ORAL | Status: DC
  Administered 2021-11-13 – 2021-11-14 (×5): 10 mg
  Filled 2021-11-13 (×6): qty 1

## 2021-11-13 MED ORDER — BETHANECHOL CHLORIDE 10 MG PO TABS: 10.0000 mg | ORAL_TABLET | Freq: Three times a day (TID) | ORAL | Status: DC

## 2021-11-13 MED ORDER — DEXMEDETOMIDINE HCL IN NACL 400 MCG/100ML IV SOLN
0.0000 ug/kg/h | INTRAVENOUS | Status: DC
  Administered 2021-11-13 – 2021-11-14 (×2): 0.9 ug/kg/h via INTRAVENOUS
  Administered 2021-11-14 (×2): 1 ug/kg/h via INTRAVENOUS
  Administered 2021-11-14: 1.1 ug/kg/h via INTRAVENOUS
  Administered 2021-11-15 (×4): 1.2 ug/kg/h via INTRAVENOUS
  Administered 2021-11-16: 0.6 ug/kg/h via INTRAVENOUS
  Administered 2021-11-16: 1.2 ug/kg/h via INTRAVENOUS
  Filled 2021-11-13 (×11): qty 100

## 2021-11-13 NOTE — Progress Notes (Signed)
eLink Physician-Brief Progress Note Patient Name: [name] DOB: Jul 22, 1955 MRN: [id number]   Date of Service  11/13/2021  HPI/Events of Note  Pt at risk for self extubation. RN asks for order for bilateral wrist restraints  eICU Interventions  Order renewed      Intervention Category Major Interventions: Respiratory failure - evaluation and management  Oretha Milch 11/13/2021, 10:22 PM

## 2021-11-13 NOTE — Plan of Care (Addendum)
  Interdisciplinary Goals of Care Family Meeting   Date carried out:: 11/13/2021  Location of the meeting: Phone conference  Member's involved: Family Member or next of kin and Other: PA  Durable Power of Attorney or acting medical decision maker: Sister Immunologist   Discussion: We discussed goals of care for [facility] .    Spoke with patient's sister Francoise Schaumann, discussed current status and prognosis, he was intubated 5/28, making some progress, but likely not optimized for extubation today.  He was formerly DNR, this was reversed at admission.   Discussed that if he requires re-intubation there is a good possibility he may need a trach.  Also discussed the option of one-way extubation and comfort care.  Pt's sister responded to that she wanted to be  notified before extubation, but did not want to discuss further.  Recommended she come to the hospital tomorrow if possible, stated that she would be there in the morning.    Code status: Mechanical ventilation  Disposition: Continue current acute care   Time spent for the meeting: 10 minutes  Darcella Gasman Bobie Kistler 11/13/2021, 11:40 AM

## 2021-11-13 NOTE — Progress Notes (Signed)
PT Cancellation Note  Patient Details Name: [name] MRN: [id number] DOB: 1955-08-28   Cancelled Treatment:    Reason Eval/Treat Not Completed: Patient not medically ready. Will continue to monitor for readiness.    Angelina Ok [facility] 11/13/2021, 4:13 PM Skip Mayer PT Acute [employer] [phone]

## 2021-11-13 NOTE — Progress Notes (Addendum)
NAME:  [name], MRN:  [id number], DOB:  1955-07-01, LOS: 5 ADMISSION DATE:  10/23/2021, CONSULTATION DATE:  11/09/21 REFERRING MD:  TRH CHIEF COMPLAINT:  Respiratory distress   History of Present Illness:  66 year old male active smoker with PMHx as noted below admitted for TIA secondary to bilateral ICA stenosis. Initially presened to [facility] with slurred speech and R>L sided weakness. Code stroke called. Also concerned for NSTEMI so transferred to [facility]. Per bedside RN and Neurology note today he has returned to baseline and he was able to follow commands with residual dysarthria.  Overnight he had choked on dinner. Bedside RN suctioned and some food removed. He was originally on room air and desatted requiring NRB and in distress. Previously able to follow commands and mainly dysarthric but now confused and in respiratory distress. CXR with pulmonary edema. Primary team contacted family and family reversed code status to full code. PCCM consulted for transfer to ICU  Pertinent  Medical History  HTN, HLD, CAD, COPD, bilateral carotid stenosis, hx seizures from TBI, OSA  Significant Hospital Events: Including procedures, antibiotic start and stop dates in addition to other pertinent events   5/27 - Presented to [facility]. Transferred to [facility] 5/28 - Returned to baseline during day except for dysarthria. Started on dysphagia diet, aspirated while eating with subsequent respiratory distress and required intubation 5/30 issues with agitation and sedation 5/31 Initially calmer this morning on just precedex, became agitated during SBT, respiratory culture growing H. Influenza, remains on Unasyn 6/1 Weaning on PSV, not following commands on Precedex, less agitated than yesterday   Interim History / Subjective:   Seen on Precedex 0.9 mcg/kg, calm though not following commands T 100.4 WBC down-trending 425cc UOP yesterday, +7L since admission  Objective   Blood pressure 100/67, pulse 67,  temperature (!) 100.4 F (38 C), temperature source Oral, resp. rate 19, height 5\' 7"  (1.702 m), weight 60.1 kg, SpO2 98 %.    Vent Mode: PRVC FiO2 (%):  [40 %] 40 % Set Rate:  [16 bmp] 16 bmp Vt Set:  [530 mL] 530 mL PEEP:  [5 cmH20] 5 cmH20 Pressure Support:  [5 cmH20] 5 cmH20 Plateau Pressure:  [16 cmH20-20 cmH20] 16 cmH20   Intake/Output Summary (Last 24 hours) at 11/13/2021 [id number] Last data filed at 11/13/2021 0600 Gross per 24 hour  Intake 2140.05 ml  Output 425 ml  Net 1715.05 ml    Filed Weights   11/11/21 0500 11/12/21 0500 11/13/21 0358  Weight: 55.1 kg 57.3 kg 60.1 kg    General:  critically ill-appearing elderly, thin M, intubated and sedated HEENT: MM pink/moist, sclera anicteric, pupils equal, ETT in place Neuro: examined on Precedex, opens eyes to voice,  CV: s1s2 rrr, no m/r/g PULM:  mechanically ventilated, on PSV 10/5, mild rhonchi bilateral bases GI: soft, non-distended  Extremities: warm/dry, no edema  Skin: no rashes or lesions   Labs reviewed CBC  Glu 115 CXR-worsening retrocardiac opacity BNP up-trending 1303  Resolved Hospital Problem list   Nosebleed following NT suctioning on heparin  Assessment & Plan:   Acute hypoxemic respiratory failure secondary to aspiration, H. Influenza Pneumonia and Pulmonary edema Acute encephalopathy secondary to above in setting of critical illness Improved mental status initially this morning on Precedex, then intermittently agitated requiring prn versed -tolerated PSV for a short time -respiratory culture growing H. Flu, febrile with worsening CXR -continue Precedex, attempt SBT as mental status allows --Maintain full vent support with SAT/SBT as tolerated -titrate  Vent setting to maintain SpO2 greater than or equal to 90%. -HOB elevated 30 degrees. -Plateau pressures less than 30 cm H20.  -Follow chest x-ray, ABG prn.   -Bronchial hygiene and RT/bronchodilator protocol. -continue Unasyn started  5/28   Septic shock from H. Flu pneumonia Stable on Levophed 57mcg with SBP goal 130-160 -continue Unasyn for H. flu -Continue norepinephrine infusion -discussed with cardiology, appearance of MV mass would be atypical for emboli, low suspicion for endocarditis. Will follow blood cultures.  If were to result positive then consider empiric treatment and further discussion of risk/benefit of TEE  TIA likely secondary to Bilateral carotid stenosis HTN Appreciate vascular surgery input -SBP goal 130-160 -seen by vascular surgery, thought likely not a good candidate for vascular procedure at this time  -continue Asa and Plavix   NSTEMI CAD HLD MV vegetation vs mass Plan: -Appreciate cardiology input -no TEE while critically ill, discussed with cardiology, low suspicion endocarditis -blood cultures negative thus far, follow to completion -Continue dual antiplatelet therapy, heparin infusion stopped after discussion with neuro and cards -Statin  OSA COPD On CPAP at home -Remains intubated at this time  Hx TBI with associated seizure EEG This study is suggestive of cortical dysfunction arising from left temporal region as well as mild diffuse encephalopathy, nonspecific etiology.  No seizures or epileptiform discharges -Supportive care -seizure hx remote, not on anti-epileptics -resume buspar  Anxiety Plan: -SSRI  Protein calorie malnutrition POA -continue TF    Best Practice (right click and "Reselect all SmartList Selections" daily)   Diet/type: NPO DVT prophylaxis: systemic heparin GI prophylaxis: PPI Lines: Central line Foley:  Yes, and it is still needed Code Status:  full code Family reversed DNR prior to transfer Last date of multidisciplinary goals of care discussion [5/28 with TRH]  Will attempt to update family again today, unable to reach sister this week  Labs   CBC: Recent Labs  Lab 11/07/2021 1149 10/29/2021 1157 11/09/21 0624 11/10/21 0013  11/10/21 0133 11/11/21 0254 11/12/21 0356 11/13/21 0400  WBC 14.5*   < > 11.8*  --  17.5* 12.7* 13.4* 11.5*  NEUTROABS 11.9*  --   --   --  14.9* 9.4* 10.4* 9.0*  HGB 12.6*   < > 10.9* 10.9* 10.9* 10.6* 10.0* 9.5*  HCT 38.3*   < > 33.7* 32.0* 32.2* 31.5* 29.8* 28.6*  MCV 101.6*   < > 101.5*  --  100.6* 101.6* 101.0* 100.7*  PLT 209   < > 189  --  204 171 160 162   < > = values in this interval not displayed.     Basic Metabolic Panel: Recent Labs  Lab 11/10/21 0133 11/10/21 2248 11/11/21 0254 11/11/21 1334 11/12/21 0356 11/13/21 0400  NA 136 137 134*  --  136 139  K 4.0 3.8 3.7  --  3.9 4.0  CL 106 106 103  --  106 104  CO2 23 25 24   --  27 29  GLUCOSE 189* 127* 128*  --  115* 154*  BUN 18 17 17   --  17 18  CREATININE 0.97 0.69 0.70  --  0.70 0.65  CALCIUM 7.8* 7.9* 7.9*  --  7.8* 7.9*  MG 2.1 1.9 1.9  --  2.1 2.1  PHOS  --   --   --  2.9 2.4*  --     GFR: Estimated Creatinine Clearance: 76.2 mL/min (by C-G formula based on SCr of 0.65 mg/dL). Recent Labs  Lab 11/10/21 0133 11/11/21 0254  11/12/21 0356 11/13/21 0400  WBC 17.5* 12.7* 13.4* 11.5*  LATICACIDVEN 1.1  --   --   --      Liver Function Tests: Recent Labs  Lab 11/09/21 0624 11/10/21 0133 11/11/21 0254 11/12/21 0356 11/13/21 0400  AST 29 44* 39 46* 35  ALT 19 22 22 24 24   ALKPHOS 56 62 62 76 90  BILITOT 1.4* 1.0 0.8 0.9 0.5  PROT 5.7* 5.4* 5.2* 4.8* 4.8*  ALBUMIN 2.9* 2.5* 2.3* 2.0* 1.8*    No results for input(s): LIPASE, AMYLASE in the last 168 hours. No results for input(s): AMMONIA in the last 168 hours.  ABG    Component Value Date/Time   PHART 7.362 11/10/2021 0013   PCO2ART 50.6 (H) 11/10/2021 0013   PO2ART 378 (H) 11/10/2021 0013   HCO3 28.8 (H) 11/10/2021 0013   TCO2 30 11/10/2021 0013   ACIDBASEDEF 0.3 01/04/2008 0320   O2SAT 100 11/10/2021 0013      Coagulation Profile: Recent Labs  Lab 10/25/2021 1149  INR 1.1     Cardiac Enzymes: No results for input(s):  CKTOTAL, CKMB, CKMBINDEX, TROPONINI in the last 168 hours.  HbA1C: Hgb A1c MFr Bld  Date/Time Value Ref Range Status  11/09/2021 06:24 AM 6.2 (H) 4.8 - 5.6 % Final    Comment:    (NOTE) Pre diabetes:          5.7%-6.4%  Diabetes:              >6.4%  Glycemic control for   <7.0% adults with diabetes   02/03/2020 07:34 AM 6.1 (H) 4.8 - 5.6 % Final    Comment:    (NOTE) Pre diabetes:          5.7%-6.4%  Diabetes:              >6.4%  Glycemic control for   <7.0% adults with diabetes     CBG: Recent Labs  Lab 11/12/21 1520 11/12/21 1935 11/12/21 2339 11/13/21 0338 11/13/21 0741  GLUCAP 113* 177* 160* 135* 141*     Review of Systems:   Unable to obtain due to critical illness  Past Medical History:  He,  has a past medical history of Anxiety, Carotid artery disease ([place]), Cerebral aneurysm, Closed head injury, COPD (chronic obstructive pulmonary disease) ([place]), Depression, GERD (gastroesophageal reflux disease), Hyperlipidemia, Hypertension, Seizures ([place]), and Sleep apnea.   Surgical History:  History reviewed. No pertinent surgical history.   Social History:   reports that he has been smoking cigarettes. He has a 50.00 pack-year smoking history. He has never used smokeless tobacco. He reports current drug use. Drug: Marijuana. He reports that he does not drink alcohol.   Family History:  His family history includes Obesity in his sister.   Allergies Allergies  Allergen Reactions   Codeine Rash     Home Medications  Prior to Admission medications   Medication Sig Start Date End Date Taking? Authorizing Provider  albuterol (VENTOLIN HFA) 108 (90 Base) MCG/ACT inhaler 2 PUFFS EVERY 6 HOURS AS NEEDED FOR WHEEZING OR SHORTNESS OF BREATH Patient taking differently: Inhale 2 puffs into the lungs every 6 (six) hours as needed for wheezing or shortness of breath. 11/20/19  Yes Dettinger, Fransisca Kaufmann, MD  aspirin 325 MG tablet Take 325 mg by mouth daily.   Yes [provider]  busPIRone (BUSPAR) 10 MG tablet TAKE ONE TABLET THREE TIMES DAILY Patient taking differently: Take 10 mg by mouth 3 (three) times daily. 08/26/21  Yes Dettinger, Fransisca Kaufmann, MD  ipratropium-albuterol (DUONEB) 0.5-2.5 (3) MG/3ML SOLN INHALE 3 MLS BY NEBULIZER 4 TIMES A DAY Patient taking differently: Take 3 mLs by nebulization every 6 (six) hours as needed (wheezing/shortness of breath). 09/22/21  Yes Dettinger, Fransisca Kaufmann, MD  mupirocin ointment (BACTROBAN) 2 % PLACE 1 APPLICATION INTO THE NOSE 2 TIMES A DAY Patient taking differently: Apply 1 application. topically 2 (two) times daily. PLACE 1 APPLICATION INTO THE NOSE 2 TIMES A DAY 03/13/20  Yes Dettinger, Fransisca Kaufmann, MD  MYRBETRIQ 25 MG TB24 tablet TAKE ONE TABLET ONCE DAILY Patient taking differently: Take 25 mg by mouth daily. 10/27/21  Yes Dettinger, Fransisca Kaufmann, MD  niacin (NIASPAN) 1000 MG CR tablet TAKE 1 TABLET BY MOUTH AT BEDTIME. Patient taking differently: Take 1,000 mg by mouth at bedtime. 08/26/21  Yes Dettinger, Fransisca Kaufmann, MD  Nystatin (GERHARDT'S BUTT CREAM) CREA Apply 1 application topically daily. Equal parts nystatin and hydrocortisone and zinc oxide 03/15/20  Yes Dettinger, Fransisca Kaufmann, MD  omeprazole (PRILOSEC) 40 MG capsule TAKE (1) CAPSULE DAILY Patient taking differently: Take 40 mg by mouth daily. 08/26/21  Yes Dettinger, Fransisca Kaufmann, MD  PARoxetine (PAXIL) 20 MG tablet TAKE ONE TABLET ONCE DAILY Patient taking differently: Take 20 mg by mouth daily. 08/26/21  Yes Dettinger, Fransisca Kaufmann, MD  Potassium 99 MG TABS Take 1 tablet by mouth daily.   Yes [provider]  pramipexole (MIRAPEX) 0.5 MG tablet Take 2 tablets (1 mg total) by mouth at bedtime. 09/19/20  Yes Dettinger, Fransisca Kaufmann, MD  risperiDONE (RISPERDAL) 2 MG tablet Take 1 tablet (2 mg total) by mouth at bedtime. 09/19/20  Yes Dettinger, Fransisca Kaufmann, MD  simvastatin (ZOCOR) 20 MG tablet TAKE ONE TABLET AT BEDTIME Patient taking differently: Take 20 mg by mouth daily at 6 PM.  08/26/21  Yes Dettinger, Fransisca Kaufmann, MD  thiamine 100 MG tablet Take 1 tablet (100 mg total) by mouth daily. 12/02/19  Yes Roxan Hockey, MD     CRITICAL CARE Performed by: Otilio Carpen Chyan Carnero   Total critical care time: 35 minutes  Critical care time was exclusive of separately billable procedures and treating other patients.  Critical care was necessary to treat or prevent imminent or life-threatening deterioration.  Critical care was time spent personally by me on the following activities: development of treatment plan with patient and/or surrogate as well as nursing, discussions with consultants, evaluation of patient's response to treatment, examination of patient, obtaining history from patient or surrogate, ordering and performing treatments and interventions, ordering and review of laboratory studies, ordering and review of radiographic studies, pulse oximetry and re-evaluation of patient's condition.  Otilio Carpen Laloni Rowton, PA-C [facility] See Amion for pager If no response to pager , please call [phone] until 7pm After 7:00 pm call Elink  [phone]?[place]

## 2021-11-13 NOTE — Progress Notes (Signed)
OT Cancellation Note  Patient Details Name: [name] MRN: [id number] DOB: June 10, 1956   Cancelled Treatment:    Reason Eval/Treat Not Completed: Patient not medically ready. Pt starting to wean from PSV, very restless and diaphoretic. RN requesting OT hold today, will follow up as pt is medically able to tolerate therapy.   Kindred Reidinger Elane Genni Buske 11/13/2021, 12:27 PM

## 2021-11-13 NOTE — Progress Notes (Signed)
STROKE TEAM PROGRESS NOTE   SUBJECTIVE (INTERVAL HISTORY) No family is at the bedside. Pt still on vent on precedex. Per RN, pt was able to show thumb up and squeeze hand, however, needed versed intermittently for coughing and agitation over vent. Plan for extubation tomorrow after discussed with sister.    OBJECTIVE Temp:  [98.3 F (36.8 C)-100.4 F (38 C)] 98.6 F (37 C) (06/01 1510) Pulse Rate:  [58-82] 59 (06/01 1637) Cardiac Rhythm: Normal sinus rhythm (05/31 2000) Resp:  [12-24] 16 (06/01 1200) BP: (94-154)/(49-67) 154/49 (06/01 1637) SpO2:  [83 %-100 %] 95 % (06/01 1000) Arterial Line BP: (111-167)/(44-74) 157/51 (06/01 1000) FiO2 (%):  [40 %] 40 % (06/01 1637) Weight:  [60.1 kg] 60.1 kg (06/01 0358)  Recent Labs  Lab 11/12/21 2339 11/13/21 0338 11/13/21 0741 11/13/21 1111 11/13/21 1509  GLUCAP 160* 135* 141* 134* 138*   Recent Labs  Lab 11/10/21 0133 11/10/21 2248 11/11/21 0254 11/11/21 1334 11/12/21 0356 11/13/21 0400  NA 136 137 134*  --  136 139  K 4.0 3.8 3.7  --  3.9 4.0  CL 106 106 103  --  106 104  CO2 23 25 24   --  27 29  GLUCOSE 189* 127* 128*  --  115* 154*  BUN 18 17 17   --  17 18  CREATININE 0.97 0.69 0.70  --  0.70 0.65  CALCIUM 7.8* 7.9* 7.9*  --  7.8* 7.9*  MG 2.1 1.9 1.9  --  2.1 2.1  PHOS  --   --   --  2.9 2.4*  --    Recent Labs  Lab 11/09/21 0624 11/10/21 0133 11/11/21 0254 11/12/21 0356 11/13/21 0400  AST 29 44* 39 46* 35  ALT 19 22 22 24 24   ALKPHOS 56 62 62 76 90  BILITOT 1.4* 1.0 0.8 0.9 0.5  PROT 5.7* 5.4* 5.2* 4.8* 4.8*  ALBUMIN 2.9* 2.5* 2.3* 2.0* 1.8*   Recent Labs  Lab 11/11/2021 1149 10/15/2021 1157 11/09/21 0624 11/10/21 0013 11/10/21 0133 11/11/21 0254 11/12/21 0356 11/13/21 0400  WBC 14.5*   < > 11.8*  --  17.5* 12.7* 13.4* 11.5*  NEUTROABS 11.9*  --   --   --  14.9* 9.4* 10.4* 9.0*  HGB 12.6*   < > 10.9* 10.9* 10.9* 10.6* 10.0* 9.5*  HCT 38.3*   < > 33.7* 32.0* 32.2* 31.5* 29.8* 28.6*  MCV 101.6*   < >  101.5*  --  100.6* 101.6* 101.0* 100.7*  PLT 209   < > 189  --  204 171 160 162   < > = values in this interval not displayed.   No results for input(s): CKTOTAL, CKMB, CKMBINDEX, TROPONINI in the last 168 hours. No results for input(s): LABPROT, INR in the last 72 hours.  No results for input(s): COLORURINE, LABSPEC, [place], GLUCOSEU, HGBUR, BILIRUBINUR, KETONESUR, PROTEINUR, UROBILINOGEN, NITRITE, LEUKOCYTESUR in the last 72 hours.  Invalid input(s): APPERANCEUR      Component Value Date/Time   CHOL 127 10/29/2021 1148   CHOL 125 10/15/2021 1443   TRIG 34 10/20/2021 1148   TRIG 101 10/03/2013 1654   HDL 64 10/23/2021 1148   HDL 63 10/15/2021 1443   HDL 64 10/03/2013 1654   CHOLHDL 2.0 10/17/2021 1148   VLDL 7 10/22/2021 1148   LDLCALC 56 11/01/2021 1148   LDLCALC 52 10/15/2021 1443   LDLCALC 45 10/03/2013 1654   Lab Results  Component Value Date   HGBA1C 6.2 (H) 11/09/2021  Component Value Date/Time   LABOPIA NONE DETECTED 11/09/2021 1608   COCAINSCRNUR NONE DETECTED 10/26/2021 1608   LABBENZ NONE DETECTED 11/03/2021 1608   AMPHETMU NONE DETECTED 10/31/2021 1608   THCU NONE DETECTED 10/22/2021 1608   LABBARB NONE DETECTED 11/07/2021 1608    Recent Labs  Lab 10/27/2021 1149  ETH <10    I have personally reviewed the radiological images below and agree with the radiology interpretations.  CT ANGIO HEAD NECK W WO CM  Result Date: 11/09/2021 CLINICAL DATA:  Stroke workup EXAM: CT ANGIOGRAPHY HEAD AND NECK TECHNIQUE: Multidetector CT imaging of the head and neck was performed using the standard protocol during bolus administration of intravenous contrast. Multiplanar CT image reconstructions and MIPs were obtained to evaluate the vascular anatomy. Carotid stenosis measurements (when applicable) are obtained utilizing NASCET criteria, using the distal internal carotid diameter as the denominator. RADIATION DOSE REDUCTION: This exam was performed according to the  departmental dose-optimization program which includes automated exposure control, adjustment of the mA and/or kV according to patient size and/or use of iterative reconstruction technique. CONTRAST:  151mL OMNIPAQUE IOHEXOL 350 MG/ML SOLN COMPARISON:  Stroke MRI from yesterday FINDINGS: CT HEAD FINDINGS Brain: No evidence of acute infarction, hemorrhage, hydrocephalus, extra-axial collection or mass lesion/mass effect. Chronic small vessel ischemia in the deep white matter with chronic lacunar/perforator infarcts at the left deep gray nuclei and right caudate head. Generalized cerebral volume loss. Vascular: No hyperdense vessel or unexpected calcification. Skull: Remote left-sided burr holes.  No acute or aggressive finding Sinuses: Extensive bilateral sinus opacification at the level of the maxillary and asymmetric left ethmoid sinuses. Orbits: No acute finding Review of the MIP images confirms the above findings CTA NECK FINDINGS Aortic arch: Minimal coverage, the brachiocephalic artery origin is not seen. Atherosclerosis. Right carotid system: Diffuse calcified plaque with severe bulky plaque at the bifurcation completely obscuring a patent lumen. The downstream right ICA is under filled compared to the left. Left carotid system: Calcified plaque primarily at the common carotid origin and bifurcation. A mixed density plaque at the left ICA origin has a web-like component causing over 70% stenosis. The left ECA origin is occluded with downstream reconstitution. Vertebral arteries: Calcified plaque involving both proximal subclavian arteries. Proximal right subclavian stenosis measures 60%. Calcified plaque at both V1 segments with high-grade narrowing on the left. The right vertebral artery is dominant. No dissection or beading Skeleton: Generalized cervical spine degeneration which is advanced. Other neck: No acute finding Upper chest: Severe emphysema at the apices. Review of the MIP images confirms the above  findings CTA HEAD FINDINGS Anterior circulation: Extensive calcified plaque along the bilateral carotid siphons to the degree that calcified plaque over estimates the diffuse narrowed appearance. Mild to moderate bilateral ICA stenosis is possible. No major branch occlusion, beading, or aneurysm. Bilateral atheromatous narrowing of MCA branches with moderate left M2 and right M1 stenosis see seen on coronal thick MIPS. Narrow left A1 segment which is likely both acquired and congenital. Posterior circulation: The vertebral and basilar arteries are smoothly contoured and diffusely patent. No branch occlusion, beading, or aneurysm. Venous sinuses: Unremarkable Anatomic variants: None significant Review of the MIP images confirms the above findings IMPRESSION: 1. Severe bilateral proximal ICA stenosis due to advanced atherosclerosis. On the right a measurable channel is not visible and there is downstream underfilling. 2. 60% narrowing at the right proximal subclavian artery. The brachiocephalic origin was not covered on the scan. 3. Advanced narrowing at the origin of the non dominant  left vertebral artery. 4. Moderate left M2 and right M1 segment stenoses. 5. Aortic Atherosclerosis (ICD10-I70.0) and Emphysema (ICD10-J43.9). Electronically Signed   By: Jorje Guild M.D.   On: 11/09/2021 11:47   MR BRAIN WO CONTRAST  Result Date: 11/07/2021 CLINICAL DATA:  Slurred speech bilateral weakness EXAM: MRI HEAD WITHOUT CONTRAST TECHNIQUE: Multiplanar, multiecho pulse sequences of the brain and surrounding structures were obtained without intravenous contrast. COMPARISON:  02/03/2020, correlation is also made with CT head 11/02/2021 FINDINGS: Evaluation is somewhat limited by motion artifact. Brain: No restricted diffusion to suggest acute or subacute infarct. No acute hemorrhage, mass, mass effect, or midline shift. No hydrocephalus or extra-axial collection. Cerebral volume loss is advanced for age. Confluent T2  hyperintense signal in the periventricular white matter and pons, likely the sequela of moderate to severe chronic small vessel ischemic disease. Redemonstrated lacunar infarcts in the thalami and basal ganglia. Redemonstrated hemosiderin deposition in the left midbrain, likely sequela of prior hypertensive microhemorrhage. Vascular: Normal arterial flow voids. Skull and upper cervical spine: Normal marrow signal. Sinuses/Orbits: Chronic bilateral maxillary sinusitis. Mucosal thickening in the ethmoid air cells. No acute finding in the orbits. Other: Trace fluid in left mastoid tip. IMPRESSION: No acute intracranial process. No evidence of acute or subacute infarct. Electronically Signed   By: Merilyn Baba M.D.   On: 10/27/2021 21:21   DG CHEST PORT 1 VIEW  Result Date: 11/12/2021 CLINICAL DATA:  COPD and fever. EXAM: PORTABLE CHEST 1 VIEW COMPARISON:  11/10/2021 FINDINGS: Endotracheal tube terminates 4.3 cm above carina. Nasogastric tube extends beyond the inferior aspect of the film. Left internal jugular line tip at mid SVC. Mild cardiomegaly. Small bilateral pleural effusions. No pneumothorax. Moderate interstitial edema, increased. Left greater than right lower lung predominant airspace disease, increased. IMPRESSION: Overall worsened aeration, with progressive congestive heart failure and small bilateral pleural effusions. Concurrent progressive left-greater-than-right airspace disease. Cannot exclude pneumonia, especially in the left mid and lower lung. Electronically Signed   By: Abigail Miyamoto M.D.   On: 11/12/2021 08:39   DG CHEST PORT 1 VIEW  Result Date: 11/10/2021 CLINICAL DATA:  Central line placement. EXAM: PORTABLE CHEST 1 VIEW COMPARISON:  Chest x-ray 11/09/2021. FINDINGS: Endotracheal tube tip is 5.4 cm above the carina. Enteric tube extends below the level of the diaphragm. Left sided central venous catheter tip projects over the SVC. The cardiomediastinal silhouette is stable and within  normal limits. There are hazy patchy opacities in the left mid and lower lung, unchanged. There is central pulmonary vascular congestion, unchanged. There is no pleural effusion or pneumothorax visualized. IMPRESSION: 1. New left-sided central venous catheter tip projects over the SVC. 2. Stable left mid and lower lung airspace disease with central pulmonary vascular congestion. Electronically Signed   By: Ronney Asters M.D.   On: 11/10/2021 01:25   DG CHEST PORT 1 VIEW  Result Date: 11/09/2021 CLINICAL DATA:  Intubation, NG tube placement. EXAM: PORTABLE CHEST 1 VIEW COMPARISON:  11/09/2021. FINDINGS: The heart size and mediastinal contours are stable. The pulmonary vasculature is distended. Atherosclerotic calcification of the aorta is noted. Interstitial prominence and patchy perihilar airspace disease is noted bilaterally, increased from the prior exam. No effusion or pneumothorax. The endotracheal tube terminates 5.2 cm above the carina. There is gaseous distention of the stomach with an enteric tube terminating in the distal stomach. No acute osseous abnormality. IMPRESSION: 1. Mildly distended pulmonary vasculature. 2. Interstitial prominence bilaterally with patchy perihilar airspace disease, increased from the prior exam, possible edema or  infiltrate. 3. Support apparatus as detailed above. Electronically Signed   By: Brett Fairy M.D.   On: 11/09/2021 22:31   DG CHEST PORT 1 VIEW  Result Date: 11/09/2021 CLINICAL DATA:  Respiratory distress. EXAM: PORTABLE CHEST 1 VIEW COMPARISON:  11/09/2021. FINDINGS: The heart size and mediastinal contours are stable. The pulmonary vasculature is mildly distended. Increased interstitial and perihilar opacities are noted bilaterally. No effusion or pneumothorax. No acute osseous abnormality. IMPRESSION: 1. Mildly distended pulmonary vasculature. 2. Increased interstitial and perihilar airspace opacities suggesting edema, less likely infiltrate. Electronically  Signed   By: Brett Fairy M.D.   On: 11/09/2021 20:51   DG Chest Port 1 View  Result Date: 11/09/2021 CLINICAL DATA:  Shortness of breath. EXAM: PORTABLE CHEST 1 VIEW COMPARISON:  12/03/2019 FINDINGS: 0558 hours. Rotated film. The lungs are clear without focal pneumonia, edema, pneumothorax or pleural effusion. Interstitial markings are diffusely coarsened with chronic features. Cardiopericardial silhouette is at upper limits of normal for size. Bones are diffusely demineralized. Telemetry leads overlie the chest. IMPRESSION: Chronic interstitial coarsening. No acute cardiopulmonary findings. Electronically Signed   By: Misty Stanley M.D.   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  EEG to evaluate for seizure. Level of alertness: Awake, asleep AEDs during EEG study: None Technical aspects: This EEG study was done with scalp electrodes positioned according to the 10-20 International system of electrode placement. Electrical activity was acquired at a sampling rate of 500Hz  and reviewed with a high frequency filter of 70Hz  and a low frequency filter of 1Hz . EEG data were recorded continuously and digitally stored. Description: The posterior dominant rhythm consists of 8 Hz activity of moderate voltage (25-35 uV) seen predominantly in posterior head regions, symmetric and reactive to eye opening and eye closing. Sleep was characterized by vertex waves, sleep spindles (12 to 14 Hz), maximal frontocentral region. EEG showed intermittent generalized and maximal left temporal region 3 to 6 Hz theta-delta slowing. Hyperventilation and photic stimulation were not performed.    ABNORMALITY - Intermittent slow, generalized and maximal left temporal region IMPRESSION: This study is suggestive of cortical dysfunction arising from left temporal region as well as mild diffuse encephalopathy, nonspecific etiology.  No seizures or epileptiform discharges were seen throughout the recording. Lora Havens   ECHOCARDIOGRAM COMPLETE  Result Date: 11/09/2021    ECHOCARDIOGRAM REPORT   Patient Name:   LARENZ SEAMSTER Date of Exam: 11/09/2021 Medical Rec #:  [id number]           Height:       67.0 in Accession #:    [id number]          Weight:       126.3 lb Date of Birth:  Oct 31, 1955           BSA:          1.664 m Patient Age:    25 years            BP:           119/53 mmHg Patient Gender: M                   HR:  79 bpm. Exam Location:  Inpatient Procedure: 2D Echo, Cardiac Doppler and Color Doppler Indications:    Stroke  History:        Patient has prior history of Echocardiogram examinations, most                 recent 02/03/2020. CAD, TIA and COPD; Risk Factors:Current                 Smoker, Hypertension and Dyslipidemia.  Sonographer:    Clayton Lefort RDCS (AE) Referring Phys: [id number] Margaree Mackintosh [place]  1. Left ventricular ejection fraction, by estimation, is 60 to 65%. The left ventricle has normal function. The left ventricle has no regional wall motion abnormalities. Left ventricular diastolic parameters were normal.  2. Right ventricular systolic function is normal. The right ventricular size is normal.  3. Only in parasternal long axis views ? mass on atrial surface of posterior leaflet may be shadowing artifact but in setting of TIA consider f/u TEE if clinically indicated . The mitral valve is abnormal. Trivial mitral valve regurgitation. No evidence  of mitral stenosis.  4. The aortic valve is tricuspid. There is mild calcification of the aortic valve. There is mild thickening of the aortic valve. Aortic valve regurgitation is not visualized. Aortic valve  sclerosis is present, with no evidence of aortic valve stenosis.  5. The inferior vena cava is dilated in size with >50% respiratory variability, suggesting right atrial pressure of 8 mmHg. FINDINGS  Left Ventricle: Left ventricular ejection fraction, by estimation, is 60 to 65%. The left ventricle has normal function. The left ventricle has no regional wall motion abnormalities. The left ventricular internal cavity size was normal in size. There is  no left ventricular hypertrophy. Left ventricular diastolic parameters were normal. Right Ventricle: The right ventricular size is normal. No increase in right ventricular wall thickness. Right ventricular systolic function is normal. Left Atrium: Left atrial size was normal in size. Right Atrium: Right atrial size was normal in size. Pericardium: There is no evidence of pericardial effusion. Mitral Valve: Only in parasternal long axis views ? mass on atrial surface of posterior leaflet may be shadowing artifact but in setting of TIA consider f/u TEE if clinically indicated. The mitral valve is abnormal. There is mild thickening of the mitral  valve leaflet(s). Trivial mitral valve regurgitation. No evidence of mitral valve stenosis. Tricuspid Valve: The tricuspid valve is normal in structure. Tricuspid valve regurgitation is not demonstrated. No evidence of tricuspid stenosis. Aortic Valve: The aortic valve is tricuspid. There is mild calcification of the aortic valve. There is mild thickening of the aortic valve. Aortic valve regurgitation is not visualized. Aortic valve sclerosis is present, with no evidence of aortic valve stenosis. Aortic valve mean gradient measures 2.0 mmHg. Aortic valve peak gradient measures 4.2 mmHg. Aortic valve area, by VTI measures 3.33 cm. Pulmonic Valve: The pulmonic valve was normal in structure. Pulmonic valve regurgitation is not visualized. No evidence of pulmonic stenosis. Aorta: The aortic root is normal in size and structure.  Venous: The inferior vena cava is dilated in size with greater than 50% respiratory variability, suggesting right atrial pressure of 8 mmHg. IAS/Shunts: No atrial level shunt detected by color flow Doppler.  LEFT VENTRICLE PLAX 2D LVIDd:         5.10 cm   Diastology LVIDs:         3.80 cm   LV e' medial:    7.83 cm/s LV PW:  0.90 cm   LV E/e' medial:  12.5 LV IVS:        1.20 cm   LV e' lateral:   8.16 cm/s LVOT diam:     2.20 cm   LV E/e' lateral: 12.0 LV SV:         62 LV SV Index:   37 LVOT Area:     3.80 cm  RIGHT VENTRICLE             IVC RV Basal diam:  2.90 cm     IVC diam: 2.60 cm RV S prime:     12.50 cm/s TAPSE (M-mode): 2.2 cm LEFT ATRIUM             Index        RIGHT ATRIUM           Index LA diam:        3.20 cm 1.92 cm/m   RA Area:     18.40 cm LA Vol (A2C):   49.8 ml 29.94 ml/m  RA Volume:   43.60 ml  26.21 ml/m LA Vol (A4C):   52.7 ml 31.68 ml/m LA Biplane Vol: 54.1 ml 32.52 ml/m  AORTIC VALVE AV Area (Vmax):    3.54 cm AV Area (Vmean):   3.37 cm AV Area (VTI):     3.33 cm AV Vmax:           102.00 cm/s AV Vmean:          65.600 cm/s AV VTI:            0.186 m AV Peak Grad:      4.2 mmHg AV Mean Grad:      2.0 mmHg LVOT Vmax:         94.90 cm/s LVOT Vmean:        58.200 cm/s LVOT VTI:          0.163 m LVOT/AV VTI ratio: 0.88  AORTA Ao Root diam: 3.20 cm Ao Asc diam:  2.70 cm MITRAL VALVE MV Area (PHT): 5.62 cm    SHUNTS MV Decel Time: 135 msec    Systemic VTI:  0.16 m MV E velocity: 98.00 cm/s  Systemic Diam: 2.20 cm MV A velocity: 56.10 cm/s MV E/A ratio:  1.75 Jenkins Rouge MD Electronically signed by Jenkins Rouge MD Signature Date/Time: 11/09/2021/2:11:08 PM    Final    CT HEAD CODE STROKE WO CONTRAST  Result Date: 10/27/2021 CLINICAL DATA:  Code stroke. Stroke suspected. Right-sided weakness for 23 hours EXAM: CT HEAD WITHOUT CONTRAST TECHNIQUE: Contiguous axial images were obtained from the base of the skull through the vertex without intravenous contrast. RADIATION DOSE  REDUCTION: This exam was performed according to the departmental dose-optimization program which includes automated exposure control, adjustment of the mA and/or kV according to patient size and/or use of iterative reconstruction technique. COMPARISON:  09/14/2021 FINDINGS: Brain: No evidence of acute infarction, hemorrhage, hydrocephalus, extra-axial collection or mass lesion/mass effect. Multiple chronic lacunar infarcts at the bilateral deep gray nuclei, especially the bilateral thalamus. 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 Premature chronic small vessel ischemia. Confluence clinic gliosis in the cerebral white matter. Vascular: No hyperdense vessel or unexpected calcification. Skull: Normal. Negative for fracture or focal lesion. Prior burr holes on the left. Sinuses/Orbits: Chronic bilateral maxillary sinusitis with left more than right opacification and sclerotic wall thickening. ASPECTS [facility] Stroke Program Early CT Score) - Ganglionic level infarction (caudate, lentiform nuclei, internal capsule, insula, M1-M3 cortex):  7 - Supraganglionic infarction (M4-M6 cortex): 3 Total score (0-10 with 10 being normal): 10 IMPRESSION: 1. No acute finding. 2. Advanced chronic small vessel ischemia. Generalized cerebral atrophy. 3. Chronic sinusitis. Electronically Signed   By: Jorje Guild M.D.   On: 11/07/2021 12:04   VAS US CAROTID  Result Date: 11/10/2021 Carotid Arterial Duplex Study Patient Name:  PHILIP TISBY  Date of Exam:   11/09/2021 Medical Rec #: [id number]            Accession #:    [id number] Date of Birth: [birth year]            Patient Gender: M Patient Age:   21 years Exam Location:  [facility] Procedure:      VAS US CAROTID Referring Phys: Servando Snare --------------------------------------------------------------------------------  Indications:       CVA, Speech disturbance and Weakness. NSTEMI. Risk Factors:      Hypertension, hyperlipidemia, coronary  artery disease, prior                    CVA and TBI. Other Factors:     CTA of neck done 11/09/21 indicated "Severe bilateral proximal                    ICA stenosis due to advanced atherosclerosis. On the right a                    measurable channel is not visible and there is downstream                    underfilling." Limitations        Today's exam was limited due to heavy calcification and the                    resulting shadowing and coughing and shaking of legs. Comparison Study:  Prior carotid duplex done at [facility] 11/14/19 indicated                    R:80-99% ICA stenosis, highest end of range and L:60-79% ICA                    stenosis, highest end of range Performing Technologist: Sharion Dove RVS  Examination Guidelines: A complete evaluation includes B-mode imaging, spectral Doppler, color Doppler, and power Doppler as needed of all accessible portions of each vessel. Bilateral testing is considered an integral part of a complete examination. Limited examinations for reoccurring indications may be performed as noted.  Right Carotid Findings: +----------+--------+--------+--------+----------------------+---------+           PSV cm/sEDV cm/sStenosisPlaque Description    Comments  +----------+--------+--------+--------+----------------------+---------+ CCA Prox  50      9               calcific              Shadowing +----------+--------+--------+--------+----------------------+---------+ CCA Distal24      6               calcific              Shadowing +----------+--------+--------+--------+----------------------+---------+ ICA Prox  453     74              calcific and irregularShadowing +----------+--------+--------+--------+----------------------+---------+ ECA       95      11                                              +----------+--------+--------+--------+----------------------+---------+ +----------+--------+-------+--------+-------------------+  PSV cm/sEDV cmsDescribeArm Pressure (mmHG) +----------+--------+-------+--------+-------------------+ Subclavian110                                        +----------+--------+-------+--------+-------------------+ +---------+--------+---+--------+--+ VertebralPSV cm/s214EDV cm/s49 +---------+--------+---+--------+--+  Left Carotid Findings: +----------+-------+--------+--------+--------------------+--------------------+           PSV    EDV cm/sStenosisPlaque Description  Comments                       cm/s                                                            +----------+-------+--------+--------+--------------------+--------------------+ CCA Prox  135    27              heterogenous                             +----------+-------+--------+--------+--------------------+--------------------+ CCA Distal126    21              heterogenous                             +----------+-------+--------+--------+--------------------+--------------------+ ICA Prox  581    132     80-99%  calcific and                                                              irregular                                +----------+-------+--------+--------+--------------------+--------------------+ ICA Mid   196    47                                                       +----------+-------+--------+--------+--------------------+--------------------+ ICA Distal81     20                                                       +----------+-------+--------+--------+--------------------+--------------------+ ECA       263    35                                  mid to distal  portion              +----------+-------+--------+--------+--------------------+--------------------+ +----------+--------+--------+--------+-------------------+           PSV cm/sEDV cm/sDescribeArm Pressure (mmHG)  +----------+--------+--------+--------+-------------------+ [id number]                                          +----------+--------+--------+--------+-------------------+ +---------+--------+--+--------+-+ VertebralPSV cm/s21EDV cm/s8 +---------+--------+--+--------+-+   Summary: Right Carotid: Near occlusion of the proximal right ICA. Left Carotid: Velocities in the left ICA are consistent with a 80-99% stenosis. Vertebrals:  Bilateral vertebral arteries demonstrate antegrade flow. Subclavians: Normal flow hemodynamics were seen in bilateral subclavian              arteries. *See table(s) above for measurements and observations.  Electronically signed by Servando Snare MD on 11/10/2021 at 10:56:26 AM.    Final      PHYSICAL EXAM  Temp:  [98.3 F (36.8 C)-100.4 F (38 C)] 98.6 F (37 C) (06/01 1510) Pulse Rate:  [58-82] 59 (06/01 1637) Resp:  [12-24] 16 (06/01 1200) BP: (94-154)/(49-67) 154/49 (06/01 1637) SpO2:  [83 %-100 %] 95 % (06/01 1000) Arterial Line BP: (111-167)/(44-74) 157/51 (06/01 1000) FiO2 (%):  [40 %] 40 % (06/01 1637) Weight:  [60.1 kg] 60.1 kg (06/01 0358)  General - Well nourished, well developed, intubated on precedex.  Ophthalmologic - fundi not visualized due to noncooperation.  Cardiovascular - Regular rate and rhythm.  Neuro - intubated on precedex, eyes closed, not open on voice, not able to follow simple commands. Eyes in mid position, not blinking to visual threat, no tracking, pupils 1.51mm, sluggish to light. No doll's eyes. Corneal reflex weakly present left stronger than right, gag and cough strongly present. Breathing over the vent.  Facial symmetry not able to test due to ET tube.  Mild movement BLEs on pain, no movement of BUEs even on pain. Sensation, coordination and gait not tested.   ASSESSMENT/PLAN Mr. ANDREAZ KALMBACH is a 66 y.o. male with history of TBI with chronic dysarthria, seizure, hypertension, hyperlipidemia, CAD, OSA, carotid  stenosis bilaterally admitted for worsening dysarthria, bilateral leg weakness. No tPA given due to outside window.    TIA likely due to bilateral ICA stenosis in the setting of soft BP CT no acute abnormality  MRI no acute infarct CT head and neck severe bilateral proximal ICA stenosis, right more than left, and decreased downstream flow on the right.  Bilateral ICA siphon moderate to severe stenosis.  Moderate left M2 and right M1 segment stenosis.  60% right proximal subclavian artery stenosis.  Left VA origin stenosis. Carotid Doppler Right near occlusion and left ICA 80-99% stenosis 2D Echo EF 60 to 65%, ? MV mass on the atrial surface EEG no seizure, cortical dysfunction arising from left temporal region as well as mild diffuse encephalopathy LDL 56 HgbA1c 6.2 Heparin IV for VTE prophylaxis aspirin 325 mg daily prior to admission, now on ASA 81 and plavix 75. 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  EEG negative.  CT/MRI/MRA negative.  However, carotid Doppler showed bilateral ICA more than 70% stenosis, right more than left.  EF 55 to 60%.  Discharged on aspirin 325.  Bilateral carotid stenosis 01/2020 carotid Doppler showed bilateral ICA > 70% stenosis, right more than left CT head and neck severe bilateral proximal ICA stenosis, right more than left, and decreased downstream flow on the right.  Carotid Doppler right ICA near occlusion, left ICA 80 to 99% stenosis Vascular surgery Dr. Donzetta Matters on board Given current comorbidities, VVS felt pt not a good candidate for carotid revascularization at this time  ? Mitral valve mass/vegetation Non-STEMI TTE showed ? MV mass/vegetation on the atrial  surface Cardiology on board TEE once stable is recommended by cardiology  High risk for cardiovascular procedure at the time On ASA, Plavix  Hypotension History of hypertension BP stable over A line now On Levophed drip Off Neo Avoid low BP given bilateral extracranial carotid stenosis and intracranial stenosis BP goal 120-1 60 before carotid revascularization  Hyperlipidemia Home meds: Zocor 20 LDL 56, goal < 70 Now on Zocor 20 given LDL at goal Continue statin at discharge  Other Stroke Risk Factors Advanced age Obstructive sleep apnea, on CPAP at home Coronary artery disease  Other Active Problems History of TBI with residual of severe dysarthria  Hospital day # 5   Rosalin Hawking, MD PhD Stroke Neurology 11/13/2021 5:44 PM    To contact Stroke Continuity provider, please refer to [email or web address] After hours, contact General Neurology

## 2021-11-13 NOTE — Plan of Care (Signed)
  Problem: Education: Goal: Knowledge of General Education information will improve Description: Including pain rating scale, medication(s)/side effects and non-pharmacologic comfort measures Outcome: Progressing   Problem: Health Behavior/Discharge Planning: Goal: Ability to manage health-related needs will improve Outcome: Progressing   Problem: Clinical Measurements: Goal: Ability to maintain clinical measurements within normal limits will improve Outcome: Progressing Goal: Will remain free from infection Outcome: Progressing Goal: Diagnostic test results will improve Outcome: Progressing Goal: Respiratory complications will improve Outcome: Progressing Goal: Cardiovascular complication will be avoided Outcome: Progressing   Problem: Activity: Goal: Risk for activity intolerance will decrease Outcome: Progressing   Problem: Nutrition: Goal: Adequate nutrition will be maintained Outcome: Progressing   Problem: Coping: Goal: Level of anxiety will decrease Outcome: Progressing   Problem: Elimination: Goal: Will not experience complications related to bowel motility Outcome: Progressing Goal: Will not experience complications related to urinary retention Outcome: Progressing   Problem: Pain Managment: Goal: General experience of comfort will improve Outcome: Progressing   Problem: Safety: Goal: Ability to remain free from injury will improve Outcome: Progressing   Problem: Skin Integrity: Goal: Risk for impaired skin integrity will decrease Outcome: Progressing   Problem: Education: Goal: Knowledge of disease or condition will improve Outcome: Progressing Goal: Knowledge of secondary prevention will improve (SELECT ALL) Outcome: Progressing Goal: Knowledge of patient specific risk factors will improve (INDIVIDUALIZE FOR PATIENT) Outcome: Progressing Goal: Individualized Educational Video(s) Outcome: Progressing   Problem: Coping: Goal: Will verbalize  positive feelings about self Outcome: Progressing Goal: Will identify appropriate support needs Outcome: Progressing   Problem: Health Behavior/Discharge Planning: Goal: Ability to manage health-related needs will improve Outcome: Progressing   Problem: Self-Care: Goal: Ability to participate in self-care as condition permits will improve Outcome: Progressing Goal: Verbalization of feelings and concerns over difficulty with self-care will improve Outcome: Progressing Goal: Ability to communicate needs accurately will improve Outcome: Progressing   Problem: Nutrition: Goal: Risk of aspiration will decrease Outcome: Progressing Goal: Dietary intake will improve Outcome: Progressing   Problem: Intracerebral Hemorrhage Tissue Perfusion: Goal: Complications of Intracerebral Hemorrhage will be minimized Outcome: Progressing   Problem: Ischemic Stroke/TIA Tissue Perfusion: Goal: Complications of ischemic stroke/TIA will be minimized Outcome: Progressing   Problem: Safety: Goal: Non-violent Restraint(s) Outcome: Progressing

## 2021-11-13 DEATH — deceased

## 2021-11-14 ENCOUNTER — Inpatient Hospital Stay (HOSPITAL_COMMUNITY): Payer: Medicare Other

## 2021-11-14 DIAGNOSIS — I639 Cerebral infarction, unspecified: Secondary | ICD-10-CM | POA: Diagnosis not present

## 2021-11-14 DIAGNOSIS — I6523 Occlusion and stenosis of bilateral carotid arteries: Secondary | ICD-10-CM | POA: Diagnosis not present

## 2021-11-14 DIAGNOSIS — E43 Unspecified severe protein-calorie malnutrition: Secondary | ICD-10-CM | POA: Diagnosis not present

## 2021-11-14 DIAGNOSIS — J690 Pneumonitis due to inhalation of food and vomit: Secondary | ICD-10-CM | POA: Diagnosis not present

## 2021-11-14 DIAGNOSIS — I214 Non-ST elevation (NSTEMI) myocardial infarction: Secondary | ICD-10-CM | POA: Diagnosis not present

## 2021-11-14 LAB — CBC WITH DIFFERENTIAL/PLATELET
Abs Immature Granulocytes: 0.04 10*3/uL (ref 0.00–0.07)
Basophils Absolute: 0 10*3/uL (ref 0.0–0.1)
Basophils Relative: 0 %
Eosinophils Absolute: 0.2 10*3/uL (ref 0.0–0.5)
Eosinophils Relative: 2 %
HCT: 28.9 % — ABNORMAL LOW (ref 39.0–52.0)
Hemoglobin: 9.5 g/dL — ABNORMAL LOW (ref 13.0–17.0)
Immature Granulocytes: 0 %
Lymphocytes Relative: 13 %
Lymphs Abs: 1.2 10*3/uL (ref 0.7–4.0)
MCH: 33.2 pg (ref 26.0–34.0)
MCHC: 32.9 g/dL (ref 30.0–36.0)
MCV: 101 fL — ABNORMAL HIGH (ref 80.0–100.0)
Monocytes Absolute: 0.9 10*3/uL (ref 0.1–1.0)
Monocytes Relative: 10 %
Neutro Abs: 6.7 10*3/uL (ref 1.7–7.7)
Neutrophils Relative %: 75 %
Platelets: 163 10*3/uL (ref 150–400)
RBC: 2.86 MIL/uL — ABNORMAL LOW (ref 4.22–5.81)
RDW: 12.7 % (ref 11.5–15.5)
WBC: 9.2 10*3/uL (ref 4.0–10.5)
nRBC: 0 % (ref 0.0–0.2)

## 2021-11-14 LAB — COMPREHENSIVE METABOLIC PANEL
ALT: 26 U/L (ref 0–44)
AST: 39 U/L (ref 15–41)
Albumin: 1.7 g/dL — ABNORMAL LOW (ref 3.5–5.0)
Alkaline Phosphatase: 91 U/L (ref 38–126)
Anion gap: 5 (ref 5–15)
BUN: 17 mg/dL (ref 8–23)
CO2: 29 mmol/L (ref 22–32)
Calcium: 7.6 mg/dL — ABNORMAL LOW (ref 8.9–10.3)
Chloride: 104 mmol/L (ref 98–111)
Creatinine, Ser: 0.56 mg/dL — ABNORMAL LOW (ref 0.61–1.24)
GFR, Estimated: >60 mL/min (ref >60)
Glucose, Bld: 163 mg/dL — ABNORMAL HIGH (ref 70–99)
Potassium: 4 mmol/L (ref 3.5–5.1)
Sodium: 138 mmol/L (ref 135–145)
Total Bilirubin: 0.3 mg/dL (ref 0.3–1.2)
Total Protein: 4.6 g/dL — ABNORMAL LOW (ref 6.5–8.1)

## 2021-11-14 LAB — GLUCOSE, CAPILLARY
Glucose-Capillary: 131 mg/dL — ABNORMAL HIGH (ref 70–99)
Glucose-Capillary: 135 mg/dL — ABNORMAL HIGH (ref 70–99)
Glucose-Capillary: 141 mg/dL — ABNORMAL HIGH (ref 70–99)
Glucose-Capillary: 146 mg/dL — ABNORMAL HIGH (ref 70–99)
Glucose-Capillary: 148 mg/dL — ABNORMAL HIGH (ref 70–99)

## 2021-11-14 LAB — MAGNESIUM
Magnesium: 2 mg/dL (ref 1.7–2.4)
Magnesium: 1.9 mg/dL (ref 1.7–2.4)

## 2021-11-14 LAB — PHOSPHORUS: Phosphorus: 2.8 mg/dL (ref 2.5–4.6)

## 2021-11-14 MED ORDER — GLYCOPYRROLATE 0.2 MG/ML IJ SOLN
0.2000 mg | INTRAMUSCULAR | Status: DC | PRN
  Administered 2021-11-14 – 2021-11-16 (×8): 0.2 mg via INTRAVENOUS
  Filled 2021-11-14 (×8): qty 1

## 2021-11-14 MED ORDER — VITAL HIGH PROTEIN PO LIQD: 1000.0000 mL | ORAL | Status: DC

## 2021-11-14 MED ORDER — DEXTROSE 5 % IV SOLN: INTRAVENOUS | Status: DC

## 2021-11-14 MED ORDER — SODIUM CHLORIDE 0.9% FLUSH
10.0000 mL | Freq: Two times a day (BID) | INTRAVENOUS | Status: DC
  Administered 2021-11-14 (×2): 10 mL

## 2021-11-14 MED ORDER — DOCUSATE SODIUM 50 MG/5ML PO LIQD: 100.0000 mg | Freq: Two times a day (BID) | ORAL | Status: DC

## 2021-11-14 MED ORDER — FENTANYL CITRATE (PF) 100 MCG/2ML IJ SOLN
INTRAMUSCULAR | Status: AC
  Administered 2021-11-14: 50 ug via INTRAVENOUS
  Filled 2021-11-14: qty 2

## 2021-11-14 MED ORDER — DIPHENHYDRAMINE HCL 50 MG/ML IJ SOLN
25.0000 mg | INTRAMUSCULAR | Status: DC | PRN
  Administered 2021-11-14: 25 mg via INTRAVENOUS
  Filled 2021-11-14: qty 1

## 2021-11-14 MED ORDER — HALOPERIDOL LACTATE 5 MG/ML IJ SOLN
5.0000 mg | Freq: Four times a day (QID) | INTRAMUSCULAR | Status: DC | PRN
  Administered 2021-11-14 – 2021-11-15 (×2): 5 mg via INTRAVENOUS
  Filled 2021-11-14: qty 1

## 2021-11-14 MED ORDER — FENTANYL CITRATE (PF) 100 MCG/2ML IJ SOLN
25.0000 ug | INTRAMUSCULAR | Status: DC | PRN
  Administered 2021-11-14 – 2021-11-15 (×3): 100 ug via INTRAVENOUS
  Filled 2021-11-14 (×3): qty 2

## 2021-11-14 MED ORDER — HALOPERIDOL LACTATE 5 MG/ML IJ SOLN
INTRAMUSCULAR | Status: AC
  Filled 2021-11-14: qty 1

## 2021-11-14 MED ORDER — OSMOLITE 1.5 CAL PO LIQD
1000.0000 mL | ORAL | Status: DC
  Administered 2021-11-14: 1000 mL
  Filled 2021-11-14: qty 1000

## 2021-11-14 MED ORDER — VITAL AF 1.2 CAL PO LIQD: 1000.0000 mL | ORAL | Status: DC

## 2021-11-14 MED ORDER — LORAZEPAM 2 MG/ML IJ SOLN
2.0000 mg | INTRAMUSCULAR | Status: DC | PRN
  Administered 2021-11-16: 2 mg via INTRAVENOUS
  Filled 2021-11-14: qty 1

## 2021-11-14 MED ORDER — MORPHINE SULFATE (PF) 2 MG/ML IV SOLN
2.0000 mg | INTRAVENOUS | Status: DC | PRN
  Administered 2021-11-15: 2 mg via INTRAVENOUS
  Filled 2021-11-14: qty 1

## 2021-11-14 MED ORDER — ACETAMINOPHEN 650 MG RE SUPP: 650.0000 mg | Freq: Four times a day (QID) | RECTAL | Status: DC | PRN

## 2021-11-14 MED ORDER — GLYCOPYRROLATE 1 MG PO TABS: 1.0000 mg | ORAL_TABLET | ORAL | Status: DC | PRN

## 2021-11-14 MED ORDER — PROSOURCE TF PO LIQD: 45.0000 mL | Freq: Two times a day (BID) | ORAL | Status: DC

## 2021-11-14 MED ORDER — MORPHINE BOLUS VIA INFUSION
5.0000 mg | INTRAVENOUS | Status: DC | PRN
  Administered 2021-11-15 – 2021-11-16 (×7): 5 mg via INTRAVENOUS

## 2021-11-14 MED ORDER — SODIUM CHLORIDE 0.9% FLUSH: 10.0000 mL | INTRAVENOUS | Status: DC | PRN

## 2021-11-14 MED ORDER — GLYCOPYRROLATE 0.2 MG/ML IJ SOLN: 0.2000 mg | INTRAMUSCULAR | Status: DC | PRN

## 2021-11-14 MED ORDER — POLYVINYL ALCOHOL 1.4 % OP SOLN
1.0000 [drp] | Freq: Four times a day (QID) | OPHTHALMIC | Status: DC | PRN
Start: 2021-11-14 — End: 2021-11-16

## 2021-11-14 MED ORDER — POLYETHYLENE GLYCOL 3350 17 G PO PACK: 17.0000 g | PACK | Freq: Every day | ORAL | Status: DC

## 2021-11-14 MED ORDER — FENTANYL CITRATE (PF) 100 MCG/2ML IJ SOLN: 25.0000 ug | INTRAMUSCULAR | Status: DC | PRN

## 2021-11-14 MED ORDER — MORPHINE 100MG IN NS 100ML (1MG/ML) PREMIX INFUSION
0.0000 mg/h | INTRAVENOUS | Status: DC
  Administered 2021-11-15: 10 mg/h via INTRAVENOUS
  Administered 2021-11-15: 15 mg/h via INTRAVENOUS
  Administered 2021-11-15: 1 mg/h via INTRAVENOUS
  Administered 2021-11-15 – 2021-11-16 (×2): 15 mg/h via INTRAVENOUS
  Administered 2021-11-16: 17 mg/h via INTRAVENOUS
  Filled 2021-11-14 (×7): qty 100

## 2021-11-14 MED ORDER — ACETAMINOPHEN 325 MG PO TABS: 650.0000 mg | ORAL_TABLET | Freq: Four times a day (QID) | ORAL | Status: DC | PRN

## 2021-11-14 NOTE — Progress Notes (Signed)
STROKE TEAM PROGRESS NOTE   SUBJECTIVE (INTERVAL HISTORY) RN is at the bedside. Pt was extubated this am, now tolerating well. Still on precedex and very lethargic, but able to follow simple commands. Still very dysarthric with secretions in the throat, difficult to clear.    OBJECTIVE Temp:  [98.2 F (36.8 C)-99.1 F (37.3 C)] 99.1 F (37.3 C) (06/02 1130) Pulse Rate:  [50-139] 72 (06/02 1445) Cardiac Rhythm: Normal sinus rhythm (06/02 1200) Resp:  [14-38] 21 (06/02 1445) BP: (75-157)/(47-135) 75/57 (06/02 1400) SpO2:  [75 %-100 %] 95 % (06/02 1445) Arterial Line BP: (93-191)/(37-74) 99/38 (06/02 1445) FiO2 (%):  [40 %] 40 % (06/02 0744) Weight:  [61.3 kg] 61.3 kg (06/02 0500)  Recent Labs  Lab 11/13/21 1958 11/13/21 2342 11/14/21 0348 11/14/21 0743 11/14/21 1129  GLUCAP 145* 146* 141* 131* 148*   Recent Labs  Lab 11/10/21 2248 11/11/21 0254 11/11/21 1334 11/12/21 0356 11/13/21 0400 11/14/21 0508  NA 137 134*  --  136 139 138  K 3.8 3.7  --  3.9 4.0 4.0  CL 106 103  --  106 104 104  CO2 25 24  --  27 29 29   GLUCOSE 127* 128*  --  115* 154* 163*  BUN 17 17  --  17 18 17   CREATININE 0.69 0.70  --  0.70 0.65 0.56*  CALCIUM 7.9* 7.9*  --  7.8* 7.9* 7.6*  MG 1.9 1.9  --  2.1 2.1 2.0  PHOS  --   --  2.9 2.4*  --   --    Recent Labs  Lab 11/10/21 0133 11/11/21 0254 11/12/21 0356 11/13/21 0400 11/14/21 0508  AST 44* 39 46* 35 39  ALT 22 22 24 24 26   ALKPHOS 62 62 76 90 91  BILITOT 1.0 0.8 0.9 0.5 0.3  PROT 5.4* 5.2* 4.8* 4.8* 4.6*  ALBUMIN 2.5* 2.3* 2.0* 1.8* 1.7*   Recent Labs  Lab 11/10/21 0133 11/11/21 0254 11/12/21 0356 11/13/21 0400 11/14/21 0508  WBC 17.5* 12.7* 13.4* 11.5* 9.2  NEUTROABS 14.9* 9.4* 10.4* 9.0* 6.7  HGB 10.9* 10.6* 10.0* 9.5* 9.5*  HCT 32.2* 31.5* 29.8* 28.6* 28.9*  MCV 100.6* 101.6* 101.0* 100.7* 101.0*  PLT 204 171 160 162 163   No results for input(s): CKTOTAL, CKMB, CKMBINDEX, TROPONINI in the last 168 hours. No results  for input(s): LABPROT, INR in the last 72 hours.  No results for input(s): COLORURINE, LABSPEC, [place], GLUCOSEU, HGBUR, BILIRUBINUR, KETONESUR, PROTEINUR, UROBILINOGEN, NITRITE, LEUKOCYTESUR in the last 72 hours.  Invalid input(s): APPERANCEUR      Component Value Date/Time   CHOL 127 11/09/2021 1148   CHOL 125 10/15/2021 1443   TRIG 34 11/06/2021 1148   TRIG 101 10/03/2013 1654   HDL 64 11/02/2021 1148   HDL 63 10/15/2021 1443   HDL 64 10/03/2013 1654   CHOLHDL 2.0 11/12/2021 1148   VLDL 7 10/30/2021 1148   LDLCALC 56 11/02/2021 1148   LDLCALC 52 10/15/2021 1443   LDLCALC 45 10/03/2013 1654   Lab Results  Component Value Date   HGBA1C 6.2 (H) 11/09/2021      Component Value Date/Time   LABOPIA NONE DETECTED 10/17/2021 1608   COCAINSCRNUR NONE DETECTED 11/12/2021 1608   LABBENZ NONE DETECTED 10/19/2021 1608   AMPHETMU NONE DETECTED 11/09/2021 1608   THCU NONE DETECTED 10/20/2021 1608   LABBARB NONE DETECTED 10/13/2021 1608    Recent Labs  Lab 10/25/2021 1149  ETH <10    I have personally reviewed the  radiological images below and agree with the radiology interpretations.  CT ANGIO HEAD NECK W WO CM  Result Date: 11/09/2021 CLINICAL DATA:  Stroke workup EXAM: CT ANGIOGRAPHY HEAD AND NECK TECHNIQUE: Multidetector CT imaging of the head and neck was performed using the standard protocol during bolus administration of intravenous contrast. Multiplanar CT image reconstructions and MIPs were obtained to evaluate the vascular anatomy. Carotid stenosis measurements (when applicable) are obtained utilizing NASCET criteria, using the distal internal carotid diameter as the denominator. RADIATION DOSE REDUCTION: This exam was performed according to the departmental dose-optimization program which includes automated exposure control, adjustment of the mA and/or kV according to patient size and/or use of iterative reconstruction technique. CONTRAST:  159mL OMNIPAQUE IOHEXOL 350 MG/ML  SOLN COMPARISON:  Stroke MRI from yesterday FINDINGS: CT HEAD FINDINGS Brain: No evidence of acute infarction, hemorrhage, hydrocephalus, extra-axial collection or mass lesion/mass effect. Chronic small vessel ischemia in the deep white matter with chronic lacunar/perforator infarcts at the left deep gray nuclei and right caudate head. Generalized cerebral volume loss. Vascular: No hyperdense vessel or unexpected calcification. Skull: Remote left-sided burr holes.  No acute or aggressive finding Sinuses: Extensive bilateral sinus opacification at the level of the maxillary and asymmetric left ethmoid sinuses. Orbits: No acute finding Review of the MIP images confirms the above findings CTA NECK FINDINGS Aortic arch: Minimal coverage, the brachiocephalic artery origin is not seen. Atherosclerosis. Right carotid system: Diffuse calcified plaque with severe bulky plaque at the bifurcation completely obscuring a patent lumen. The downstream right ICA is under filled compared to the left. Left carotid system: Calcified plaque primarily at the common carotid origin and bifurcation. A mixed density plaque at the left ICA origin has a web-like component causing over 70% stenosis. The left ECA origin is occluded with downstream reconstitution. Vertebral arteries: Calcified plaque involving both proximal subclavian arteries. Proximal right subclavian stenosis measures 60%. Calcified plaque at both V1 segments with high-grade narrowing on the left. The right vertebral artery is dominant. No dissection or beading Skeleton: Generalized cervical spine degeneration which is advanced. Other neck: No acute finding Upper chest: Severe emphysema at the apices. Review of the MIP images confirms the above findings CTA HEAD FINDINGS Anterior circulation: Extensive calcified plaque along the bilateral carotid siphons to the degree that calcified plaque over estimates the diffuse narrowed appearance. Mild to moderate bilateral ICA stenosis  is possible. No major branch occlusion, beading, or aneurysm. Bilateral atheromatous narrowing of MCA branches with moderate left M2 and right M1 stenosis see seen on coronal thick MIPS. Narrow left A1 segment which is likely both acquired and congenital. Posterior circulation: The vertebral and basilar arteries are smoothly contoured and diffusely patent. No branch occlusion, beading, or aneurysm. Venous sinuses: Unremarkable Anatomic variants: None significant Review of the MIP images confirms the above findings IMPRESSION: 1. Severe bilateral proximal ICA stenosis due to advanced atherosclerosis. On the right a measurable channel is not visible and there is downstream underfilling. 2. 60% narrowing at the right proximal subclavian artery. The brachiocephalic origin was not covered on the scan. 3. Advanced narrowing at the origin of the non dominant left vertebral artery. 4. Moderate left M2 and right M1 segment stenoses. 5. Aortic Atherosclerosis (ICD10-I70.0) and Emphysema (ICD10-J43.9). Electronically Signed   By: Jorje Guild M.D.   On: 11/09/2021 11:47   MR BRAIN WO CONTRAST  Result Date: 10/22/2021 CLINICAL DATA:  Slurred speech bilateral weakness EXAM: MRI HEAD WITHOUT CONTRAST TECHNIQUE: Multiplanar, multiecho pulse sequences of the brain and surrounding  structures were obtained without intravenous contrast. COMPARISON:  02/03/2020, correlation is also made with CT head 10/30/2021 FINDINGS: Evaluation is somewhat limited by motion artifact. Brain: No restricted diffusion to suggest acute or subacute infarct. No acute hemorrhage, mass, mass effect, or midline shift. No hydrocephalus or extra-axial collection. Cerebral volume loss is advanced for age. Confluent T2 hyperintense signal in the periventricular white matter and pons, likely the sequela of moderate to severe chronic small vessel ischemic disease. Redemonstrated lacunar infarcts in the thalami and basal ganglia. Redemonstrated hemosiderin  deposition in the left midbrain, likely sequela of prior hypertensive microhemorrhage. Vascular: Normal arterial flow voids. Skull and upper cervical spine: Normal marrow signal. Sinuses/Orbits: Chronic bilateral maxillary sinusitis. Mucosal thickening in the ethmoid air cells. No acute finding in the orbits. Other: Trace fluid in left mastoid tip. IMPRESSION: No acute intracranial process. No evidence of acute or subacute infarct. Electronically Signed   By: Merilyn Baba M.D.   On: 10/16/2021 21:21   DG CHEST PORT 1 VIEW  Result Date: 11/12/2021 CLINICAL DATA:  COPD and fever. EXAM: PORTABLE CHEST 1 VIEW COMPARISON:  11/10/2021 FINDINGS: Endotracheal tube terminates 4.3 cm above carina. Nasogastric tube extends beyond the inferior aspect of the film. Left internal jugular line tip at mid SVC. Mild cardiomegaly. Small bilateral pleural effusions. No pneumothorax. Moderate interstitial edema, increased. Left greater than right lower lung predominant airspace disease, increased. IMPRESSION: Overall worsened aeration, with progressive congestive heart failure and small bilateral pleural effusions. Concurrent progressive left-greater-than-right airspace disease. Cannot exclude pneumonia, especially in the left mid and lower lung. Electronically Signed   By: Abigail Miyamoto M.D.   On: 11/12/2021 08:39   DG CHEST PORT 1 VIEW  Result Date: 11/10/2021 CLINICAL DATA:  Central line placement. EXAM: PORTABLE CHEST 1 VIEW COMPARISON:  Chest x-ray 11/09/2021. FINDINGS: Endotracheal tube tip is 5.4 cm above the carina. Enteric tube extends below the level of the diaphragm. Left sided central venous catheter tip projects over the SVC. The cardiomediastinal silhouette is stable and within normal limits. There are hazy patchy opacities in the left mid and lower lung, unchanged. There is central pulmonary vascular congestion, unchanged. There is no pleural effusion or pneumothorax visualized. IMPRESSION: 1. New left-sided  central venous catheter tip projects over the SVC. 2. Stable left mid and lower lung airspace disease with central pulmonary vascular congestion. Electronically Signed   By: Ronney Asters M.D.   On: 11/10/2021 01:25   DG CHEST PORT 1 VIEW  Result Date: 11/09/2021 CLINICAL DATA:  Intubation, NG tube placement. EXAM: PORTABLE CHEST 1 VIEW COMPARISON:  11/09/2021. FINDINGS: The heart size and mediastinal contours are stable. The pulmonary vasculature is distended. Atherosclerotic calcification of the aorta is noted. Interstitial prominence and patchy perihilar airspace disease is noted bilaterally, increased from the prior exam. No effusion or pneumothorax. The endotracheal tube terminates 5.2 cm above the carina. There is gaseous distention of the stomach with an enteric tube terminating in the distal stomach. No acute osseous abnormality. IMPRESSION: 1. Mildly distended pulmonary vasculature. 2. Interstitial prominence bilaterally with patchy perihilar airspace disease, increased from the prior exam, possible edema or infiltrate. 3. Support apparatus as detailed above. Electronically Signed   By: Brett Fairy M.D.   On: 11/09/2021 22:31   DG CHEST PORT 1 VIEW  Result Date: 11/09/2021 CLINICAL DATA:  Respiratory distress. EXAM: PORTABLE CHEST 1 VIEW COMPARISON:  11/09/2021. FINDINGS: The heart size and mediastinal contours are stable. The pulmonary vasculature is mildly distended. Increased interstitial and perihilar opacities  are noted bilaterally. No effusion or pneumothorax. No acute osseous abnormality. IMPRESSION: 1. Mildly distended pulmonary vasculature. 2. Increased interstitial and perihilar airspace opacities suggesting edema, less likely infiltrate. Electronically Signed   By: Brett Fairy M.D.   On: 11/09/2021 20:51   DG Chest Port 1 View  Result Date: 11/09/2021 CLINICAL DATA:  Shortness of breath. EXAM: PORTABLE CHEST 1 VIEW COMPARISON:  12/03/2019 FINDINGS: 0558 hours. Rotated film. The  lungs are clear without focal pneumonia, edema, pneumothorax or pleural effusion. Interstitial markings are diffusely coarsened with chronic features. Cardiopericardial silhouette is at upper limits of normal for size. Bones are diffusely demineralized. Telemetry leads overlie the chest. IMPRESSION: Chronic interstitial coarsening. No acute cardiopulmonary findings. Electronically Signed   By: Misty Stanley M.D.   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  EEG to evaluate for seizure. Level of alertness: Awake, asleep AEDs during EEG study: None Technical aspects: This EEG study was done with scalp electrodes positioned according to the 10-20 International system of electrode placement. Electrical activity was acquired at a sampling rate of 500Hz  and reviewed with a high frequency filter of 70Hz  and a low frequency filter of 1Hz . EEG data were recorded continuously and digitally stored. Description: The posterior dominant rhythm consists of 8 Hz activity of moderate voltage (25-35 uV) seen predominantly in posterior head regions, symmetric and reactive to eye opening and eye closing. Sleep was characterized by vertex waves, sleep spindles (12 to 14 Hz), maximal frontocentral region. EEG showed intermittent generalized and maximal left temporal region 3 to 6 Hz theta-delta slowing. Hyperventilation and photic stimulation were not performed.   ABNORMALITY - Intermittent slow, generalized and maximal left temporal region IMPRESSION: This study is suggestive of cortical dysfunction arising from left temporal region as well as mild diffuse encephalopathy, nonspecific etiology.  No  seizures or epileptiform discharges were seen throughout the recording. Lora Havens   ECHOCARDIOGRAM COMPLETE  Result Date: 11/09/2021    ECHOCARDIOGRAM REPORT   Patient Name:   [name] Date of Exam: 11/09/2021 Medical Rec #:  [id number]           Height:       67.0 in Accession #:    [id number]          Weight:       126.3 lb Date of Birth:  August 11, 1955           BSA:          1.664 m Patient Age:    37 years            BP:           119/53 mmHg Patient Gender: M                   HR:           79 bpm. Exam Location:  Inpatient Procedure: 2D Echo, Cardiac Doppler and Color Doppler Indications:    Stroke  History:        Patient has prior history of Echocardiogram examinations, most                 recent 02/03/2020. CAD, TIA and COPD; Risk Factors:Current  Smoker, Hypertension and Dyslipidemia.  Sonographer:    Ross Ludwig RDCS (AE) Referring Phys: [id number] [facility] IMPRESSIONS  1. Left ventricular ejection fraction, by estimation, is 60 to 65%. The left ventricle has normal function. The left ventricle has no regional wall motion abnormalities. Left ventricular diastolic parameters were normal.  2. Right ventricular systolic function is normal. The right ventricular size is normal.  3. Only in parasternal long axis views ? mass on atrial surface of posterior leaflet may be shadowing artifact but in setting of TIA consider f/u TEE if clinically indicated . The mitral valve is abnormal. Trivial mitral valve regurgitation. No evidence  of mitral stenosis.  4. The aortic valve is tricuspid. There is mild calcification of the aortic valve. There is mild thickening of the aortic valve. Aortic valve regurgitation is not visualized. Aortic valve sclerosis is present, with no evidence of aortic valve stenosis.  5. The inferior vena cava is dilated in size with >50% respiratory variability, suggesting right atrial pressure of 8 mmHg. FINDINGS  Left Ventricle: Left ventricular ejection  fraction, by estimation, is 60 to 65%. The left ventricle has normal function. The left ventricle has no regional wall motion abnormalities. The left ventricular internal cavity size was normal in size. There is  no left ventricular hypertrophy. Left ventricular diastolic parameters were normal. Right Ventricle: The right ventricular size is normal. No increase in right ventricular wall thickness. Right ventricular systolic function is normal. Left Atrium: Left atrial size was normal in size. Right Atrium: Right atrial size was normal in size. Pericardium: There is no evidence of pericardial effusion. Mitral Valve: Only in parasternal long axis views ? mass on atrial surface of posterior leaflet may be shadowing artifact but in setting of TIA consider f/u TEE if clinically indicated. The mitral valve is abnormal. There is mild thickening of the mitral  valve leaflet(s). Trivial mitral valve regurgitation. No evidence of mitral valve stenosis. Tricuspid Valve: The tricuspid valve is normal in structure. Tricuspid valve regurgitation is not demonstrated. No evidence of tricuspid stenosis. Aortic Valve: The aortic valve is tricuspid. There is mild calcification of the aortic valve. There is mild thickening of the aortic valve. Aortic valve regurgitation is not visualized. Aortic valve sclerosis is present, with no evidence of aortic valve stenosis. Aortic valve mean gradient measures 2.0 mmHg. Aortic valve peak gradient measures 4.2 mmHg. Aortic valve area, by VTI measures 3.33 cm. Pulmonic Valve: The pulmonic valve was normal in structure. Pulmonic valve regurgitation is not visualized. No evidence of pulmonic stenosis. Aorta: The aortic root is normal in size and structure. Venous: The inferior vena cava is dilated in size with greater than 50% respiratory variability, suggesting right atrial pressure of 8 mmHg. IAS/Shunts: No atrial level shunt detected by color flow Doppler.  LEFT VENTRICLE PLAX 2D LVIDd:          5.10 cm   Diastology LVIDs:         3.80 cm   LV e' medial:    7.83 cm/s LV PW:         0.90 cm   LV E/e' medial:  12.5 LV IVS:        1.20 cm   LV e' lateral:   8.16 cm/s LVOT diam:     2.20 cm   LV E/e' lateral: 12.0 LV SV:         62 LV SV Index:   37 LVOT Area:     3.80 cm  RIGHT VENTRICLE  IVC RV Basal diam:  2.90 cm     IVC diam: 2.60 cm RV S prime:     12.50 cm/s TAPSE (M-mode): 2.2 cm LEFT ATRIUM             Index        RIGHT ATRIUM           Index LA diam:        3.20 cm 1.92 cm/m   RA Area:     18.40 cm LA Vol (A2C):   49.8 ml 29.94 ml/m  RA Volume:   43.60 ml  26.21 ml/m LA Vol (A4C):   52.7 ml 31.68 ml/m LA Biplane Vol: 54.1 ml 32.52 ml/m  AORTIC VALVE AV Area (Vmax):    3.54 cm AV Area (Vmean):   3.37 cm AV Area (VTI):     3.33 cm AV Vmax:           102.00 cm/s AV Vmean:          65.600 cm/s AV VTI:            0.186 m AV Peak Grad:      4.2 mmHg AV Mean Grad:      2.0 mmHg LVOT Vmax:         94.90 cm/s LVOT Vmean:        58.200 cm/s LVOT VTI:          0.163 m LVOT/AV VTI ratio: 0.88  AORTA Ao Root diam: 3.20 cm Ao Asc diam:  2.70 cm MITRAL VALVE MV Area (PHT): 5.62 cm    SHUNTS MV Decel Time: 135 msec    Systemic VTI:  0.16 m MV E velocity: 98.00 cm/s  Systemic Diam: 2.20 cm MV A velocity: 56.10 cm/s MV E/A ratio:  1.75 Jenkins Rouge MD Electronically signed by Jenkins Rouge MD Signature Date/Time: 11/09/2021/2:11:08 PM    Final    CT HEAD CODE STROKE WO CONTRAST  Result Date: 11/10/2021 CLINICAL DATA:  Code stroke. Stroke suspected. Right-sided weakness for 23 hours EXAM: CT HEAD WITHOUT CONTRAST TECHNIQUE: Contiguous axial images were obtained from the base of the skull through the vertex without intravenous contrast. RADIATION DOSE REDUCTION: This exam was performed according to the departmental dose-optimization program which includes automated exposure control, adjustment of the mA and/or kV according to patient size and/or use of iterative reconstruction technique.  COMPARISON:  09/14/2021 FINDINGS: Brain: No evidence of acute infarction, hemorrhage, hydrocephalus, extra-axial collection or mass lesion/mass effect. Multiple chronic lacunar infarcts at the bilateral deep gray nuclei, especially the bilateral thalamus. 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 Premature chronic small vessel ischemia. Confluence clinic gliosis in the cerebral white matter. Vascular: No hyperdense vessel or unexpected calcification. Skull: Normal. Negative for fracture or focal lesion. Prior burr holes on the left. Sinuses/Orbits: Chronic bilateral maxillary sinusitis with left more than right opacification and sclerotic wall thickening. ASPECTS [facility] Stroke Program Early CT Score) - Ganglionic level infarction (caudate, lentiform nuclei, internal capsule, insula, M1-M3 cortex): 7 - Supraganglionic infarction (M4-M6 cortex): 3 Total score (0-10 with 10 being normal): 10 IMPRESSION: 1. No acute finding. 2. Advanced chronic small vessel ischemia. Generalized cerebral atrophy. 3. Chronic sinusitis. Electronically Signed   By: Jorje Guild M.D.   On: 10/26/2021 12:04   VAS US CAROTID  Result Date: 11/10/2021 Carotid Arterial Duplex Study Patient Name:  [name]  Date of Exam:   11/09/2021 Medical Rec #: [id number]  Accession #:    [id number] Date of Birth: February 16, 1956            Patient Gender: M Patient Age:   69 years Exam Location:  [facility] Procedure:      VAS US CAROTID Referring Phys: Servando Snare --------------------------------------------------------------------------------  Indications:       CVA, Speech disturbance and Weakness. NSTEMI. Risk Factors:      Hypertension, hyperlipidemia, coronary artery disease, prior                    CVA and TBI. Other Factors:     CTA of neck done 11/09/21 indicated "Severe bilateral proximal                    ICA stenosis due to advanced atherosclerosis. On the right a                     measurable channel is not visible and there is downstream                    underfilling." Limitations        Today's exam was limited due to heavy calcification and the                    resulting shadowing and coughing and shaking of legs. Comparison Study:  Prior carotid duplex done at [facility] 11/14/19 indicated                    R:80-99% ICA stenosis, highest end of range and L:60-79% ICA                    stenosis, highest end of range Performing Technologist: Sharion Dove RVS  Examination Guidelines: A complete evaluation includes B-mode imaging, spectral Doppler, color Doppler, and power Doppler as needed of all accessible portions of each vessel. Bilateral testing is considered an integral part of a complete examination. Limited examinations for reoccurring indications may be performed as noted.  Right Carotid Findings: +----------+--------+--------+--------+----------------------+---------+           PSV cm/sEDV cm/sStenosisPlaque Description    Comments  +----------+--------+--------+--------+----------------------+---------+ CCA Prox  50      9               calcific              Shadowing +----------+--------+--------+--------+----------------------+---------+ CCA Distal24      6               calcific              Shadowing +----------+--------+--------+--------+----------------------+---------+ ICA Prox  453     74              calcific and irregularShadowing +----------+--------+--------+--------+----------------------+---------+ ECA       95      11                                              +----------+--------+--------+--------+----------------------+---------+ +----------+--------+-------+--------+-------------------+           PSV cm/sEDV cmsDescribeArm Pressure (mmHG) +----------+--------+-------+--------+-------------------+ Subclavian110                                        +----------+--------+-------+--------+-------------------+  +---------+--------+---+--------+--+  VertebralPSV cm/s214EDV cm/s49 +---------+--------+---+--------+--+  Left Carotid Findings: +----------+-------+--------+--------+--------------------+--------------------+           PSV    EDV cm/sStenosisPlaque Description  Comments                       cm/s                                                            +----------+-------+--------+--------+--------------------+--------------------+ CCA Prox  135    27              heterogenous                             +----------+-------+--------+--------+--------------------+--------------------+ CCA Distal126    21              heterogenous                             +----------+-------+--------+--------+--------------------+--------------------+ ICA Prox  581    132     80-99%  calcific and                                                              irregular                                +----------+-------+--------+--------+--------------------+--------------------+ ICA Mid   196    47                                                       +----------+-------+--------+--------+--------------------+--------------------+ ICA Distal81     20                                                       +----------+-------+--------+--------+--------------------+--------------------+ ECA       263    35                                  mid to distal                                                             portion              +----------+-------+--------+--------+--------------------+--------------------+ +----------+--------+--------+--------+-------------------+           PSV cm/sEDV cm/sDescribeArm Pressure (mmHG) +----------+--------+--------+--------+-------------------+ [id number]                                          +----------+--------+--------+--------+-------------------+ +---------+--------+--+--------+-+  VertebralPSV  cm/s21EDV cm/s8 +---------+--------+--+--------+-+   Summary: Right Carotid: Near occlusion of the proximal right ICA. Left Carotid: Velocities in the left ICA are consistent with a 80-99% stenosis. Vertebrals:  Bilateral vertebral arteries demonstrate antegrade flow. Subclavians: Normal flow hemodynamics were seen in bilateral subclavian              arteries. *See table(s) above for measurements and observations.  Electronically signed by Servando Snare MD on 11/10/2021 at 10:56:26 AM.    Final      PHYSICAL EXAM  Temp:  [98.2 F (36.8 C)-99.1 F (37.3 C)] 99.1 F (37.3 C) (06/02 1130) Pulse Rate:  [50-139] 72 (06/02 1445) Resp:  [14-38] 21 (06/02 1445) BP: (75-157)/(47-135) 75/57 (06/02 1400) SpO2:  [75 %-100 %] 95 % (06/02 1445) Arterial Line BP: (93-191)/(37-74) 99/38 (06/02 1445) FiO2 (%):  [40 %] 40 % (06/02 0744) Weight:  [61.3 kg] 61.3 kg (06/02 0500)  General - Well nourished, well developed, lethargic  Ophthalmologic - fundi not visualized due to noncooperation.  Cardiovascular - Regular rate and rhythm.  Neuro - lethargic on precedex, eyes open on voice, able to follow simple commands but psychomotor slowing. Eyes in mid position, blinking to visual threat bilaterally,  mild left facial droop, tongue protrusion midline. BUE 3-/5 with drift but symmetrical, BLEs 3-/5 proximal and distal, however, pt lack of effort on exam given lethargy. Sensation, coordination not cooperative and gait not tested.   ASSESSMENT/PLAN [name] is a 66 y.o. male with history of TBI with chronic dysarthria, seizure, hypertension, hyperlipidemia, CAD, OSA, carotid stenosis bilaterally admitted for worsening dysarthria, bilateral leg weakness. No tPA given due to outside window.    TIA likely due to bilateral ICA stenosis in the setting of soft BP CT no acute abnormality  MRI no acute infarct CT head and neck severe bilateral proximal ICA stenosis, right more than left, and  decreased downstream flow on the right.  Bilateral ICA siphon moderate to severe stenosis.  Moderate left M2 and right M1 segment stenosis.  60% right proximal subclavian artery stenosis.  Left VA origin stenosis. Carotid Doppler Right near occlusion and left ICA 80-99% stenosis 2D Echo EF 60 to 65%, ? MV mass on the atrial surface Consider TEE once stable with plan for aggressive care  EEG no seizure, cortical dysfunction arising from left temporal region as well as mild diffuse encephalopathy LDL 56 HgbA1c 6.2 Heparin IV for VTE prophylaxis aspirin 325 mg daily prior to admission, now on ASA 81 and plavix 75. 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  EEG negative.  CT/MRI/MRA negative.  However, carotid Doppler showed bilateral ICA more than 70% stenosis, right more than left.  EF 55 to 60%.  Discharged on aspirin 325.  Bilateral carotid stenosis 01/2020 carotid Doppler showed bilateral ICA > 70% stenosis, right more than left CT head and neck severe bilateral proximal ICA stenosis, right more than left, and decreased downstream flow on the right.  Carotid Doppler right ICA near occlusion, left ICA 80 to 99% stenosis Vascular surgery Dr. Donzetta Matters on board Given current comorbidities, Dr. Donzetta Matters felt pt not a good candidate for carotid revascularization at this time Recommend outpt follow up with Dr. Donzetta Matters  ? Mitral valve mass/vegetation Non-STEMI TTE showed ? MV  mass/vegetation on the atrial surface Cardiology on board TEE once stable is recommended by cardiology  High risk for cardiovascular procedure at the time On ASA, Plavix  Hypotension History of hypertension BP stable over A line for now On  Levophed drip Off Neo Avoid low BP given bilateral extracranial carotid stenosis and intracranial stenosis BP goal 120-160 before carotid revascularization  Hyperlipidemia Home meds: Zocor 20 LDL 56, goal < 70 Now on Zocor 20 given LDL at goal Continue statin at discharge  Other Stroke Risk Factors Advanced age Obstructive sleep apnea, on CPAP at home Coronary artery disease  Other Active Problems History of TBI with residual of severe dysarthria  Hospital day # 6  Neurology will follow peripherally. Please call us back once pt more stabilized and plan for further work up like TEE and vascular surgery follow up. Pt will follow up with Dr. Leonie Man at stroke clinic at [facility] in about 4 weeks after discharge.    Rosalin Hawking, MD PhD Stroke Neurology 11/14/2021 3:07 PM    To contact Stroke Continuity provider, please refer to [email or web address] After hours, contact General Neurology

## 2021-11-14 NOTE — Progress Notes (Signed)
   11/14/21 1145  Clinical Encounter Type  Visited With Patient and family together  Visit Type Initial;Spiritual support  Referral From Nurse  Consult/Referral To Chaplain  Spiritual Encounters  Spiritual Needs Prayer;Emotional   Chaplain responded to page for support of family whose loved one was being extubated. When Chaplain arrived, family members (sister, son, and grandson) were present in the room and procedure had been completed by medical staff. Chaplain provided emotional and spiritual support, which included prayer.   Jon Gills, Resident Chaplain [phone]

## 2021-11-14 NOTE — Progress Notes (Signed)
Pt bilateral wrist restraints discontinued at this time. Pt is extubated and able to follow commands.

## 2021-11-14 NOTE — Progress Notes (Signed)
SpO2 goal of 88% and above per Dr. Thora Lance. RT will continue to monitor and be available as needed.

## 2021-11-14 NOTE — TOC Progression Note (Signed)
Transition of Care [facility]) - Progression Note    Patient Details  Name: [name] MRN: [id number] Date of Birth: 1955/10/04  [facility]) CM/SW Contact  Beckie Busing, RN Phone Number:[phone]  11/14/2021, 8:56 AM  Clinical Narrative:    TOC continues to follow. Patient remains critically ill. On vent with precedex, fentanyl, levophed. NO current needs, TOC following.    Expected Discharge Plan: Home/Self Care Barriers to Discharge: Continued Medical Work up  Expected Discharge Plan and Services Expected Discharge Plan: Home/Self Care     Post Acute Care Choice: Skilled Nursing Facility Living arrangements for the past 2 months: Mobile Home                                       Social Determinants of Health (SDOH) Interventions    Readmission Risk Interventions    12/04/2019    4:53 PM  Readmission Risk Prevention Plan  Transportation Screening Complete  Medication Review (RN Care Manager) Complete  PCP or Specialist appointment within 3-5 days of discharge Not Complete  PCP/Specialist Appt Not Complete comments Patient discharging to SNF for rehab  HRI or Home Care Consult Complete  SW Recovery Care/Counseling Consult Complete  Palliative Care Screening Not Applicable  Skilled Nursing Facility Complete

## 2021-11-14 NOTE — Procedures (Signed)
Cortrak  Tube Type:  Cortrak - 43 inches Tube Location:  Left nare Initial Placement:  Stomach Secured by: Bridle Technique Used to Measure Tube Placement:  Marking at nare/corner of mouth Cortrak Secured At:  70 cm  Cortrak Tube Team Note:  Consult received to place a Cortrak feeding tube.   X-ray is required, abdominal x-ray has been ordered by the Cortrak team. Please confirm tube placement before using the Cortrak tube.   If the tube becomes dislodged please keep the tube and contact the Cortrak team at [email or web address] ([email or web address]) for replacement.  If after hours and replacement cannot be delayed, place a NG tube and confirm placement with an abdominal x-ray.    Josilynn Losh MS, RD, LDN Please refer to AMION for RD and/or RD on-call/weekend/after hours pager   

## 2021-11-14 NOTE — Progress Notes (Signed)
NAME:  [name], MRN:  [id number], DOB:  [birth year], LOS: 6 ADMISSION DATE:  11/05/2021, CONSULTATION DATE:  11/09/21 REFERRING MD:  TRH CHIEF COMPLAINT:  Respiratory distress   History of Present Illness:  66 year old male active smoker with PMHx as noted below admitted for TIA secondary to bilateral ICA stenosis. Initially presened to [facility] with slurred speech and R>L sided weakness. Code stroke called. Also concerned for NSTEMI so transferred to [facility] Per bedside RN and Neurology note today he has returned to baseline and he was able to follow commands with residual dysarthria.  Overnight he had choked on dinner. Bedside RN suctioned and some food removed. He was originally on room air and desatted requiring NRB and in distress. Previously able to follow commands and mainly dysarthric but now confused and in respiratory distress. CXR with pulmonary edema. Primary team contacted family and family reversed code status to full code. PCCM consulted for transfer to ICU  Pertinent  Medical History  HTN, HLD, CAD, COPD, bilateral carotid stenosis, hx seizures from TBI, OSA  Significant Hospital Events: Including procedures, antibiotic start and stop dates in addition to other pertinent events   5/27 - Presented to [facility]. Transferred to [facility] 5/28 - Returned to baseline during day except for dysarthria. Started on dysphagia diet, aspirated while eating with subsequent respiratory distress and required intubation 5/30 issues with agitation and sedation 5/31 Initially calmer this morning on just precedex, became agitated during SBT, respiratory culture growing H. Influenza, remains on Unasyn 6/1 Weaning on PSV, not following commands on Precedex, less agitated than yesterday   Interim History / Subjective:   0.40, PEEP 5 Precedex 0.8, norepinephrine 2 WBC normal 9.2 I/O+ 10.2 L total  Objective   Blood pressure (!) 88/58, pulse (!) 57, temperature 99.1 F (37.3 C), temperature source  Oral, resp. rate 16, height 5\' 7"  (1.702 m), weight 61.3 kg, SpO2 97 %.    Vent Mode: PSV;CPAP FiO2 (%):  [40 %] 40 % Set Rate:  [16 bmp] 16 bmp Vt Set:  [530 mL] 530 mL PEEP:  [5 cmH20] 5 cmH20 Pressure Support:  [8 cmH20] 8 cmH20 Plateau Pressure:  [9 cmH20-18 cmH20] 13 cmH20   Intake/Output Summary (Last 24 hours) at 11/14/2021 1017 Last data filed at 11/14/2021 1000 Gross per 24 hour  Intake 3401.66 ml  Output 600 ml  Net 2801.66 ml   Filed Weights   11/12/21 0500 11/13/21 0358 11/14/21 0500  Weight: 57.3 kg 60.1 kg 61.3 kg    General: Thin chronically and acutely ill-appearing man, ventilated HEENT: ET tube in good position, pupils equal and react Neuro: Cannot open eyes, does not wake to voice, vigorous stim on current Precedex CV: Distant, no murmur PULM: Overall clear, some basilar rhonchi present GI: Nondistended, positive bowel sounds Extremities: No edema Skin: No rash   Resolved Hospital Problem list   Nosebleed following NT suctioning on heparin  Assessment & Plan:   Acute hypoxemic respiratory failure secondary to aspiration, H. Influenza Pneumonia and Pulmonary edema Acute encephalopathy secondary to above in setting of critical illness Waxing and waning mental status, currently quite sedated -Continue PSV.  Pushing for possible extubation on 6/2.  Discussed goals with the patient's sister at bedside.  Based on his wishes in the past I have supported a one-way extubation, no plans to reintubate if he were to decompensate.  She agrees with this.  If he fails then we will push for comfort care. -Wean Precedex as able -VAP  prevention orders -Bronchial hygiene -Unasyn day 6 of 7 on 6/2  Septic shock from H. 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  Following blood cultures which  are negative so far.  If they were to result positive then would consider endocarditis and push for possible sooner TEE  TIA likely secondary to Bilateral carotid stenosis HTN Appreciate vascular surgery input -Continue SBP goal 130-160 -seen by vascular surgery, thought likely not a good candidate for vascular procedure at this time  -Continue Asa and Plavix   NSTEMI CAD HLD MV vegetation vs mass Plan: -Appreciate cardiology input -no plan for TEE while critically ill, low suspicion endocarditis -Blood cultures negative so far as above -Dual antiplatelet therapy, heparin infusion completed -Statin as ordered  OSA COPD On CPAP at home -Remains intubated at this time  Hx TBI with associated seizure EEG This study is suggestive of cortical dysfunction arising from left temporal region as well as mild diffuse encephalopathy, nonspecific etiology.  No seizures or epileptiform discharges -Supportive care -seizure hx remote, not on any AED at this time -Continue BuSpar, risperidone, trazodone  Anxiety Plan: -SSRI  Protein calorie malnutrition POA -Supporting with tube feeding    Best Practice (right click and "Reselect all SmartList Selections" daily)   Diet/type: NPO DVT prophylaxis: systemic heparin GI prophylaxis: PPI Lines: Central line Foley:  Yes, and it is still needed Code Status:  full code Family reversed DNR prior to transfer Last date of multidisciplinary goals of care discussion [5/28 with TRH]  Goals of care and direction of care discussed with the patient's sister at bedside this morning 6/2.  Based on his prior wishes, prior DNR status, debilitated state (she cares for him at home and he has in-home nursing care) I have recommended that we push for successful extubation but that if he fails then we deferred reintubation and transition him to comfort based care.  She understands and agrees.  Labs   CBC: Recent Labs  Lab 11/10/21 0133 11/11/21 0254  11/12/21 0356 11/13/21 0400 11/14/21 0508  WBC 17.5* 12.7* 13.4* 11.5* 9.2  NEUTROABS 14.9* 9.4* 10.4* 9.0* PENDING  HGB 10.9* 10.6* 10.0* 9.5* 9.5*  HCT 32.2* 31.5* 29.8* 28.6* 28.9*  MCV 100.6* 101.6* 101.0* 100.7* 101.0*  PLT 204 171 160 162 [id number]    Basic Metabolic Panel: Recent Labs  Lab 11/10/21 2248 11/11/21 0254 11/11/21 1334 11/12/21 0356 11/13/21 0400 11/14/21 0508  NA 137 134*  --  136 139 138  K 3.8 3.7  --  3.9 4.0 4.0  CL 106 103  --  106 104 104  CO2 25 24  --  27 29 29   GLUCOSE 127* 128*  --  115* 154* 163*  BUN 17 17  --  17 18 17   CREATININE 0.69 0.70  --  0.70 0.65 0.56*  CALCIUM 7.9* 7.9*  --  7.8* 7.9* 7.6*  MG 1.9 1.9  --  2.1 2.1 2.0  PHOS  --   --  2.9 2.4*  --   --    GFR: Estimated Creatinine Clearance: 77.7 mL/min (A) (by C-G formula based on SCr of 0.56 mg/dL (L)). Recent Labs  Lab 11/10/21 0133 11/11/21 0254 11/12/21 0356 11/13/21 0400 11/14/21 0508  WBC 17.5* 12.7* 13.4* 11.5* 9.2  LATICACIDVEN 1.1  --   --   --   --  Liver Function Tests: Recent Labs  Lab 11/10/21 0133 11/11/21 0254 11/12/21 0356 11/13/21 0400 11/14/21 0508  AST 44* 39 46* 35 39  ALT 22 22 24 24 26   ALKPHOS 62 62 76 90 91  BILITOT 1.0 0.8 0.9 0.5 0.3  PROT 5.4* 5.2* 4.8* 4.8* 4.6*  ALBUMIN 2.5* 2.3* 2.0* 1.8* 1.7*   No results for input(s): LIPASE, AMYLASE in the last 168 hours. No results for input(s): AMMONIA in the last 168 hours.  ABG    Component Value Date/Time   PHART 7.362 11/10/2021 0013   PCO2ART 50.6 (H) 11/10/2021 0013   PO2ART 378 (H) 11/10/2021 0013   HCO3 28.8 (H) 11/10/2021 0013   TCO2 30 11/10/2021 0013   ACIDBASEDEF 0.3 01/04/2008 0320   O2SAT 100 11/10/2021 0013      Coagulation Profile: Recent Labs  Lab 10/16/2021 1149  INR 1.1    Cardiac Enzymes: No results for input(s): CKTOTAL, CKMB, CKMBINDEX, TROPONINI in the last 168 hours.  HbA1C: Hgb A1c MFr Bld  Date/Time Value Ref Range Status  11/09/2021 06:24 AM 6.2  (H) 4.8 - 5.6 % Final    Comment:    (NOTE) Pre diabetes:          5.7%-6.4%  Diabetes:              >6.4%  Glycemic control for   <7.0% adults with diabetes   02/03/2020 07:34 AM 6.1 (H) 4.8 - 5.6 % Final    Comment:    (NOTE) Pre diabetes:          5.7%-6.4%  Diabetes:              >6.4%  Glycemic control for   <7.0% adults with diabetes     CBG: Recent Labs  Lab 11/13/21 1509 11/13/21 1958 11/13/21 2342 11/14/21 0348 11/14/21 0743  GLUCAP 138* 145* 146* 141* 131*    CRITICAL CARE Performed by: Collene Gobble   Total critical care time: 34 minutes  Critical care time was exclusive of separately billable procedures and treating other patients.  Critical care was necessary to treat or prevent imminent or life-threatening deterioration.  Critical care was time spent personally by me on the following activities: development of treatment plan with patient and/or surrogate as well as nursing, discussions with consultants, evaluation of patient's response to treatment, examination of patient, obtaining history from patient or surrogate, ordering and performing treatments and interventions, ordering and review of laboratory studies, ordering and review of radiographic studies, pulse oximetry and re-evaluation of patient's condition.  Baltazar Apo, MD, PhD 11/14/2021, 10:17 AM [place] [facility] [phone] or if no answer before 7:00PM call [phone] For any issues after 7:00PM please call eLink [phone]

## 2021-11-14 NOTE — Progress Notes (Signed)
Patient with new agitation and work of breathing patient oxygen dropped to mid 80s, HR to the 150s and BP 190/76, Dr. Delton Coombes called and RT at bedside. Bipap placed by respiratory and Precedex placed at max. Patient sister also called and altered about change in patient status. Awaiting new orders at this time.

## 2021-11-14 NOTE — Progress Notes (Signed)
Sister at bedside. Refused chaplain. BiPAP removed and patient placed on 2 L [place]. Precedex gtt still infusing. Purple DNR bracelet placed on  patient. Continuing to monitor patient.

## 2021-11-14 NOTE — Progress Notes (Signed)
Brief PCCM Progress Note  Notified of increased WOB, agitation - had been placed on BiPAP 80% FiO2, precedex had been titrated to max. Has needed NTS following extubation a few times. Gave a dose of haldol. Called sister Adonis Brook and talked about his deterioration. She said she was on her way to discuss. Can add fentanyl pushes to RASS 0 in the meantime.   [name] Pulmonary/Critical Care

## 2021-11-14 NOTE — Progress Notes (Signed)
OT Cancellation Note  Patient Details Name: [name] MRN: [id number] DOB: Feb 12, 1956   Cancelled Treatment:    Reason Eval/Treat Not Completed: Patient not medically ready. Pt continues to be intubated and sedated, unable to participate in therapies. OT will continue to follow for medical readiness.   Haile Bosler Elane Vivika Poythress 11/14/2021, 9:47 AM

## 2021-11-14 NOTE — Progress Notes (Signed)
  Progress Note    11/14/2021 3:54 PM * No surgery found *  Subjective: Patient is now extubated, he is able to answer minimal questions  Vitals:   11/14/21 1445 11/14/21 1521  BP:    Pulse: 72   Resp: (!) 21   Temp:  98.2 F (36.8 C)  SpO2: 95%     Physical Exam: He is now awake and alert Moviong all 4 extremities to command  CBC    Component Value Date/Time   WBC 9.2 11/14/2021 0508   RBC 2.86 (L) 11/14/2021 0508   HGB 9.5 (L) 11/14/2021 0508   HGB 12.2 (L) 10/15/2021 1443   HCT 28.9 (L) 11/14/2021 0508   HCT 35.4 (L) 10/15/2021 1443   PLT 163 11/14/2021 0508   PLT 221 10/15/2021 1443   MCV 101.0 (H) 11/14/2021 0508   MCV 98 (H) 10/15/2021 1443   MCH 33.2 11/14/2021 0508   MCHC 32.9 11/14/2021 0508   RDW 12.7 11/14/2021 0508   RDW 12.3 10/15/2021 1443   LYMPHSABS 1.2 11/14/2021 0508   LYMPHSABS 1.5 10/15/2021 1443   MONOABS 0.9 11/14/2021 0508   EOSABS 0.2 11/14/2021 0508   EOSABS 0.0 10/15/2021 1443   BASOSABS 0.0 11/14/2021 0508   BASOSABS 0.1 10/15/2021 1443    BMET    Component Value Date/Time   NA 138 11/14/2021 0508   NA 138 10/15/2021 1443   K 4.0 11/14/2021 0508   CL 104 11/14/2021 0508   CO2 29 11/14/2021 0508   GLUCOSE 163 (H) 11/14/2021 0508   BUN 17 11/14/2021 0508   BUN 19 10/15/2021 1443   CREATININE 0.56 (L) 11/14/2021 0508   CREATININE 0.80 11/04/2012 1308   CALCIUM 7.6 (L) 11/14/2021 0508   GFRNONAA >60 11/14/2021 0508   GFRNONAA >89 11/04/2012 1308   GFRAA >60 02/03/2020 0734   GFRAA >89 11/04/2012 1308    INR    Component Value Date/Time   INR 1.1 10/31/2021 1149     Intake/Output Summary (Last 24 hours) at 11/14/2021 1554 Last data filed at 11/14/2021 1400 Gross per 24 hour  Intake 3224.21 ml  Output 400 ml  Net 2824.21 ml     Assessment/plan:  66 y.o. male is here with bilateral carotid artery stenosis with heavy calcifications.  We had considered treatment of his carotid disease until he had an aspiration code.   He is now recovering from this.  Currently not in any shape for surgical intervention and will likely need to recover and follow-up in my office in several weeks to months.    Neill Jurewicz C. Randie Heinz, MD [facility] Office: [phone] Pager: [phone]  11/14/2021 3:54 PM

## 2021-11-14 NOTE — Progress Notes (Addendum)
Came to speak to patients sister who arrived.  She was called when he was having respiratory failure post extubation regarding the plan of care.  Patient is on bipap, satting well on 80% fio2.  Shallow breaths.  Arousable to voice, appears comfortable, received haldol and fentanyl today, continues on precedex.  Was not able to clear secretions and required NT suctioning today. Now a bit hypotensive (Map 50)  Discussed current status.  Bipap not a good long term plan (aspiration, weak cough, secretions).  Pt remains DNR/DNI. Will remove and focus on his comfort primarily.   Can continue cortrak for now if not bothering him.  Can continue foley for now.   Cont precedex, prn morphine or infusion if needed.

## 2021-11-14 NOTE — Progress Notes (Signed)
Physical Therapy Treatment Patient Details Name: [name] MRN: [id number] DOB: 1956-06-11 Today's Date: 11/14/2021   History of Present Illness 66 year old male Presented 10/20/2021 with slurred speech, bilateral right greater than left weakness. CT head and MRI brain negative; elevated troponins with NSTEMI.  On 5/28 developed acute respiratory failure and septic shock due to H influenza  and was intubated. PMH prior stroke HTN, HLD, CAD, COPD, obstructive sleep apnea, seizures, TBI, anxiety, depression,     PT Comments    Pt remains intubated and currently with minimal response to activity. Worked on looking at any range of motion limitations that have developed or are developing with events of last week. Pt's LE's with good passive range of motion. UE range of motion limited in bilateral hands rt > lt. Will continue to follow pt for improved alertness and ability to participate.    Recommendations for follow up therapy are one component of a multi-disciplinary discharge planning process, led by the attending physician.  Recommendations may be updated based on patient status, additional functional criteria and insurance authorization.  Follow Up Recommendations  Skilled nursing-short term rehab (<3 hours/day)     Assistance Recommended at Discharge Frequent or constant Supervision/Assistance  Patient can return home with the following Two people to help with walking and/or transfers;Two people to help with bathing/dressing/bathroom;Assistance with cooking/housework;Help with stairs or ramp for entrance   Equipment Recommendations  Other (comment) (to be determined)    Recommendations for Other Services       Precautions / Restrictions Precautions Precautions: Fall;Other (comment) Precaution Comments: intubated     Mobility  Bed Mobility                    Transfers                        Ambulation/Gait                   Stairs              Wheelchair Mobility    Modified Rankin (Stroke Patients Only)       Balance                                            Cognition Arousal/Alertness: Lethargic, Suspect due to medications   Overall Cognitive Status: Difficult to assess                                          Exercises General Exercises - Upper Extremity Shoulder Flexion: PROM, Both, 5 reps, Supine Shoulder ABduction: PROM, Both, 5 reps, Supine Elbow Flexion: PROM, Both, 5 reps, Supine Elbow Extension: PROM, Both, 5 reps, Supine Wrist Flexion: PROM, Both, 5 reps, Supine Wrist Extension: PROM, Both, 5 reps, Supine Digit Composite Flexion: PROM, Both, 5 reps, Supine Composite Extension: PROM, Both, 5 reps, Supine General Exercises - Lower Extremity Ankle Circles/Pumps: PROM, Both, 5 reps, Supine Heel Slides: PROM, Both, 5 reps, Supine Hip ABduction/ADduction: PROM, Right, 5 reps, Supine    General Comments        Pertinent Vitals/Pain      Home Living  Prior Function            PT Goals (current goals can now be found in the care plan section) Acute Rehab PT Goals PT Goal Formulation: Patient unable to participate in goal setting Time For Goal Achievement: 11/28/21 Potential to Achieve Goals: Fair Progress towards PT goals: Goals downgraded-see care plan    Frequency    Min 3X/week      PT Plan Current plan remains appropriate    Co-evaluation              AM-PAC PT "6 Clicks" Mobility   Outcome Measure  Help needed turning from your back to your side while in a flat bed without using bedrails?: Total Help needed moving from lying on your back to sitting on the side of a flat bed without using bedrails?: Total Help needed moving to and from a bed to a chair (including a wheelchair)?: Total Help needed standing up from a chair using your arms (e.g., wheelchair or bedside chair)?: Total Help needed to  walk in hospital room?: Total Help needed climbing 3-5 steps with a railing? : Total 6 Click Score: 6    End of Session   Activity Tolerance: Patient limited by lethargy Patient left: in bed   PT Visit Diagnosis: Unsteadiness on feet (R26.81)     Time: [id number] PT Time Calculation (min) (ACUTE ONLY): 12 min  Charges:  $Therapeutic Exercise: 8-22 mins                     [place] [facility] 11/14/2021, 10:25 AM

## 2021-11-14 NOTE — Progress Notes (Signed)
[facility] Progress Note Patient Name: [name] DOB: [birth year] MRN: [id number]   Date of Service  11/14/2021  HPI/Events of Note  Patient comfort care  eICU Interventions  Morphine gtt ordered for comfort     Intervention Category Minor Interventions: Agitation / anxiety - evaluation and management  Kinzee Happel Rodman Pickle 11/14/2021, 9:51 PM

## 2021-11-14 NOTE — Progress Notes (Signed)
Nutrition Follow-up  DOCUMENTATION CODES:   Severe malnutrition in context of chronic illness  INTERVENTION:   Tube feeding via Cortrak tube: Osmolite 1.5 at 55 ml/h (1320 ml per day)  Provides 1980 kcal, 83 gm protein, 1006 ml free water daily  MVI with minerals daily via tube.  NUTRITION DIAGNOSIS:   Severe Malnutrition related to chronic illness (COPD) as evidenced by severe muscle depletion, severe fat depletion.  Ongoing  GOAL:   Patient will meet greater than or equal to 90% of their needs  Progressing  MONITOR:   Vent status, TF tolerance, Skin, Labs  REASON FOR ASSESSMENT:   Consult Enteral/tube feeding initiation and management  ASSESSMENT:   66 yo male admitted with TIA, bilateral ICA stenosis. PMH includes active smoker, HTN, HLD, CAD, COPD, bilateral carotid stenosis, seizures r/t TBI, OSA.  Discussed patient in ICU rounds and with RN today. Extubated this morning. TF off since extubation. Cortrak placed. Tip in the stomach per x-ray. Received MD Consult for TF initiation and management.  Labs reviewed.  CBG: [phone]  Medications reviewed and include Novolog, MVI with minerals, Precedex, Levophed.  Diet Order:   Diet Order             Diet NPO time specified  Diet effective now                   EDUCATION NEEDS:   Not appropriate for education at this time  Skin:  Skin Assessment: Skin Integrity Issues: Skin Integrity Issues:: Stage II Stage II: sacrum  Last BM:  6/2  Height:   Ht Readings from Last 1 Encounters:  10/19/2021 5\' 7"  (1.702 m)    Weight:   Wt Readings from Last 1 Encounters:  11/14/21 61.3 kg     BMI:  Body mass index is 21.17 kg/m.  Estimated Nutritional Needs:   Kcal:  1900-2100  Protein:  80-100 gm  Fluid:  >/= 1.9 L    Lucas Mallow RD, LDN, CNSC Please refer to Amion for contact information.

## 2021-11-14 NOTE — Procedures (Signed)
Extubation Procedure Note  Patient Details:   Name: [name] DOB: 1955-12-02 MRN: [id number]   Airway Documentation:    Vent end date: 11/14/21 Vent end time: 1108   Evaluation  O2 sats: stable throughout Complications: No apparent complications Patient did tolerate procedure well. Bilateral Breath Sounds: Clear, Diminished   Yes  Pt suctioned via ETT and orally prior to extuabtion. Positive cuff leak. Pt extubated per physician order. Pt extubated to 6L nasal cannula. Pt able to say name, give a loose, weak cough and no stridor heard at this time. Family remains at bedside with pt. RT will continue to monitor and be available as needed.   [name] 11/14/2021, 11:10 AM

## 2021-11-15 DIAGNOSIS — J690 Pneumonitis due to inhalation of food and vomit: Secondary | ICD-10-CM | POA: Diagnosis not present

## 2021-11-15 DIAGNOSIS — I639 Cerebral infarction, unspecified: Secondary | ICD-10-CM | POA: Diagnosis not present

## 2021-11-15 NOTE — Progress Notes (Signed)
NAME:  [name], MRN:  [id number], DOB:  1955-12-24, LOS: 7 ADMISSION DATE:  11/09/2021, CONSULTATION DATE:  11/09/21 REFERRING MD:  TRH CHIEF COMPLAINT:  Respiratory distress   History of Present Illness:  66 year old male active smoker with PMHx as noted below admitted for TIA secondary to bilateral ICA stenosis. Initially presened to [facility] with slurred speech and R>L sided weakness. Code stroke called. Also concerned for NSTEMI so transferred to [facility]. Per bedside RN and Neurology note today he has returned to baseline and he was able to follow commands with residual dysarthria.  Overnight he had choked on dinner. Bedside RN suctioned and some food removed. He was originally on room air and desatted requiring NRB and in distress. Previously able to follow commands and mainly dysarthric but now confused and in respiratory distress. CXR with pulmonary edema. Primary team contacted family and family reversed code status to full code. 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 Transferred to [facility] 5/28 - Returned to baseline during day except for dysarthria. Started on dysphagia diet, aspirated while eating with subsequent respiratory distress and required intubation 5/30 issues with agitation and sedation 5/31 Initially calmer this morning on just precedex, became agitated during SBT, respiratory culture growing H. Influenza, remains on Unasyn 6/1 Weaning on PSV, not following commands on Precedex, less agitated than yesterday 6/2 one-way extubation.  Marginal respiratory status, required NT suction.  Transition to comfort care after decompensation, brief period of BiPAP  Interim History / Subjective:  Precedex 1.2 Morphine 10  Objective    Blood pressure (!) 95/40, pulse (!) 59, temperature 97.7 F (36.5 C), temperature source Axillary, resp. rate 11, height 5\' 7"  (1.702 m), weight 61.3 kg, SpO2 98 %.    Vent Mode: PSV;BIPAP FiO2 (%):  [40 %-100 %] 80 % PEEP:  [5 cmH20] 5 cmH20 Pressure Support:  [8 cmH20] 8 cmH20   Intake/Output Summary (Last 24 hours) at 11/15/2021 0731 Last data filed at 11/15/2021 0700 Gross per 24 hour  Intake 894.83 ml  Output 775 ml  Net 119.83 ml   Filed Weights   11/12/21 0500 11/13/21 0358 11/14/21 0500  Weight: 57.3 kg 60.1 kg 61.3 kg    General: Thin chronically and acutely ill-appearing man, appears to be comfortable HEENT: Some upper airway secretions and noise Neuro: Some spontaneous movement of his upper extremities.  Does not wake to voice, does not interact CV: Distant, no murmur PULM: Coarse bilaterally, some referred upper airway noise GI: Nondistended, positive bowel sounds Extremities: No edema Skin: No rash   Resolved Hospital Problem list   Nosebleed following NT suctioning on heparin  Assessment & Plan:   Acute hypoxemic respiratory failure secondary to aspiration, H. Influenza Pneumonia and Pulmonary edema Acute encephalopathy secondary to above in setting of critical illness -Attempted extubation after mental status and SBT/PSV optimized.  Unfortunately he proved to have poor airway protection, evolved respiratory failure.  Per discussions with the patient's sister transition to comfort care. -Discontinue Unasyn  Septic shock from H. Flu pneumonia -Had required low-dose norepinephrine to maintain SBP greater than 130.  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  Blood cultures have remained negative, no evidence for  endocarditis  TIA/CVA likely secondary to Bilateral carotid stenosis HTN Appreciate vascular surgery input -Aspirin, Plavix  discontinued  NSTEMI CAD HLD MV vegetation vs mass Plan: -Appreciate cardiology input -no plan for TEE -Blood cultures negative -Antiplatelet therapy, statin discontinued as we transition to comfort  OSA COPD Was on CPAP at home -Supportive care, albuterol if needed  Hx TBI with associated seizure EEG This study is suggestive of cortical dysfunction arising from left temporal region as well as mild diffuse encephalopathy, nonspecific etiology.  No seizures or epileptiform discharges -seizure hx remote, not on any AED at this time -Discontinued BuSpar, risperidone, trazodone  Anxiety Plan: -SSRI discontinued  Protein calorie malnutrition POA -TF discontinued    Best Practice (right click and "Reselect all SmartList Selections" daily)   Diet/type: NPO DVT prophylaxis: systemic heparin GI prophylaxis: PPI Lines: Central line Foley:  Yes, and it is still needed Code Status:  full code Family reversed DNR prior to transfer Last date of multidisciplinary goals of care discussion [5/28 with TRH]  Goals of care and direction of care discussed with the patient's sister at bedside this morning 6/2.  Based on his prior wishes, prior DNR status, debilitated state (she cares for him at home and he has in-home nursing care) I have recommended that we push for successful extubation but that if he fails then we deferred reintubation and transition him to comfort based care.  She understands and agrees.  Labs   CBC: Recent Labs  Lab 11/10/21 0133 11/11/21 0254 11/12/21 0356 11/13/21 0400 11/14/21 0508  WBC 17.5* 12.7* 13.4* 11.5* 9.2  NEUTROABS 14.9* 9.4* 10.4* 9.0* 6.7  HGB 10.9* 10.6* 10.0* 9.5* 9.5*  HCT 32.2* 31.5* 29.8* 28.6* 28.9*  MCV 100.6* 101.6* 101.0* 100.7* 101.0*  PLT 204 171 160 162 [id number]    Basic Metabolic Panel: Recent Labs  Lab 11/10/21 2248 11/11/21 0254 11/11/21 1334 11/12/21 0356 11/13/21 0400 11/14/21 0508 11/14/21 1613  NA 137 134*  --  136 139  138  --   K 3.8 3.7  --  3.9 4.0 4.0  --   CL 106 103  --  106 104 104  --   CO2 25 24  --  27 29 29   --   GLUCOSE 127* 128*  --  115* 154* 163*  --   BUN 17 17  --  17 18 17   --   CREATININE 0.69 0.70  --  0.70 0.65 0.56*  --   CALCIUM 7.9* 7.9*  --  7.8* 7.9* 7.6*  --   MG 1.9 1.9  --  2.1 2.1 2.0 1.9  PHOS  --   --  2.9 2.4*  --   --  2.8   GFR: Estimated Creatinine Clearance: 77.7 mL/min (A) (by C-G formula based on SCr of 0.56 mg/dL (L)). Recent Labs  Lab 11/10/21 0133 11/11/21 0254 11/12/21 0356 11/13/21 0400 11/14/21 0508  WBC 17.5* 12.7* 13.4* 11.5* 9.2  LATICACIDVEN 1.1  --   --   --   --     Liver Function Tests: Recent Labs  Lab 11/10/21 0133 11/11/21 0254 11/12/21 0356 11/13/21 0400 11/14/21 0508  AST 44* 39 46* 35 39  ALT 22 22 24 24 26   ALKPHOS 62 62 76 90 91  BILITOT 1.0 0.8 0.9 0.5 0.3  PROT 5.4* 5.2* 4.8* 4.8* 4.6*  ALBUMIN 2.5* 2.3* 2.0* 1.8* 1.7*   No results for input(s): LIPASE, AMYLASE in the last 168 hours. No results for input(s): AMMONIA in  the last 168 hours.  ABG    Component Value Date/Time   PHART 7.362 11/10/2021 0013   PCO2ART 50.6 (H) 11/10/2021 0013   PO2ART 378 (H) 11/10/2021 0013   HCO3 28.8 (H) 11/10/2021 0013   TCO2 30 11/10/2021 0013   ACIDBASEDEF 0.3 01/04/2008 0320   O2SAT 100 11/10/2021 0013      Coagulation Profile: Recent Labs  Lab 10/21/2021 1149  INR 1.1    Cardiac Enzymes: No results for input(s): CKTOTAL, CKMB, CKMBINDEX, TROPONINI in the last 168 hours.  HbA1C: Hgb A1c MFr Bld  Date/Time Value Ref Range Status  11/09/2021 06:24 AM 6.2 (H) 4.8 - 5.6 % Final    Comment:    (NOTE) Pre diabetes:          5.7%-6.4%  Diabetes:              >6.4%  Glycemic control for   <7.0% adults with diabetes   02/03/2020 07:34 AM 6.1 (H) 4.8 - 5.6 % Final    Comment:    (NOTE) Pre diabetes:          5.7%-6.4%  Diabetes:              >6.4%  Glycemic control for   <7.0% adults with diabetes      CBG: Recent Labs  Lab 11/13/21 2342 11/14/21 0348 11/14/21 0743 11/14/21 1129 11/14/21 1519  GLUCAP 146* 141* 131* 148* 135*    CRITICAL CARE N/A   Baltazar Apo, MD, PhD 11/15/2021, 7:31 AM [facility] [phone] or if no answer before 7:00PM call [phone] For any issues after 7:00PM please call eLink [phone]

## 2021-11-16 DIAGNOSIS — I639 Cerebral infarction, unspecified: Secondary | ICD-10-CM | POA: Diagnosis not present

## 2021-11-16 DIAGNOSIS — J690 Pneumonitis due to inhalation of food and vomit: Secondary | ICD-10-CM | POA: Diagnosis not present

## 2021-11-16 LAB — CULTURE, BLOOD (ROUTINE X 2)
Culture: NO GROWTH
Culture: NO GROWTH
Special Requests: ADEQUATE
Special Requests: ADEQUATE

## 2021-11-17 LAB — PATHOLOGIST SMEAR REVIEW

## 2021-12-13 NOTE — Progress Notes (Signed)
NAME:  [name], MRN:  [id number], DOB:  January 27, 1956, LOS: 8 ADMISSION DATE:  11/12/2021, CONSULTATION DATE:  11/09/21 REFERRING MD:  TRH CHIEF COMPLAINT:  Respiratory distress   History of Present Illness:  66 year old male active smoker with PMHx as noted below admitted for TIA secondary to bilateral ICA stenosis. Initially presened to [facility] with slurred speech and R>L sided weakness. Code stroke called. Also concerned for NSTEMI so transferred to [facility]. Per bedside RN and Neurology note today he has returned to baseline and he was able to follow commands with residual dysarthria.  Overnight he had choked on dinner. Bedside RN suctioned and some food removed. He was originally on room air and desatted requiring NRB and in distress. Previously able to follow commands and mainly dysarthric but now confused and in respiratory distress. CXR with pulmonary edema. Primary team contacted family and family reversed code status to full code. 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 Transferred to [facility] 5/28 - Returned to baseline during day except for dysarthria. Started on dysphagia diet, aspirated while eating with subsequent respiratory distress and required intubation 5/30 issues with agitation and sedation 5/31 Initially calmer this morning on just precedex, became agitated during SBT, respiratory culture growing H. Influenza, remains on Unasyn 6/1 Weaning on PSV, not following commands on Precedex, less agitated than yesterday 6/2 one-way extubation.  Marginal respiratory status, required NT suction.  Transition to comfort care after decompensation, brief period of BiPAP  Interim History / Subjective:  Precedex 1.2 Morphine 10 > 15mg /h +  additional doses 5mg  prn  Foley catheter placed for comfort overnight  Objective   Blood pressure (!) 78/58, pulse 66, temperature 97.7 F (36.5 C), temperature source Axillary, resp. rate (!) 22, height 5\' 7"  (1.702 m), weight 61.3 kg, SpO2 (!) 82 %.        Intake/Output Summary (Last 24 hours) at December 04, 2021 0732 Last data filed at 04-Dec-2021 0700 Gross per 24 hour  Intake 1245.18 ml  Output 1400 ml  Net -154.82 ml   Filed Weights   11/12/21 0500 11/13/21 0358 11/14/21 0500  Weight: 57.3 kg 60.1 kg 61.3 kg    General: Thin chronically and acutely ill-appearing man, appears to be comfortable HEENT: Some upper airway secretions and noise but no evidence of distress Neuro: Slight movement right upper extremity with stimulation, otherwise sedated CV: Distant, no murmur PULM: Coarse bilaterally GI: Nondistended, hypoactive bowel sounds Extremities: No edema Skin: No rash   Resolved Hospital Problem list   Nosebleed following NT suctioning on heparin  Assessment & Plan:   Acute hypoxemic respiratory failure secondary to aspiration, H. Influenza Pneumonia and Pulmonary edema Acute encephalopathy secondary to above in setting of critical illness -Attempted extubation after mental status and SBT/PSV optimized.  Unfortunately he proved to have poor airway protection, evolved respiratory failure.  Per discussions with the patient's sister transition to comfort care. -Discontinued Unasyn -Continue comfort care measures  Septic shock from H. Flu pneumonia -Had required low-dose norepinephrine to maintain SBP greater than 130.  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  Blood cultures have remained negative, no evidence for endocarditis  TIA/CVA likely secondary to Bilateral carotid stenosis HTN  Appreciate vascular surgery input -Aspirin, Plavix discontinued  NSTEMI CAD HLD MV  vegetation vs mass Plan: -Appreciate cardiology input -no plan for TEE -Blood cultures negative -Antiplatelet therapy, statin discontinued as we transitioned to comfort  OSA COPD Was on CPAP at home -Supportive care, albuterol if needed  Hx TBI with associated seizure EEG This study is suggestive of cortical dysfunction arising from left temporal region as well as mild diffuse encephalopathy, nonspecific etiology.  No seizures or epileptiform discharges -seizure hx remote, not on any AED at this time -Discontinued BuSpar, risperidone, trazodone  Anxiety Plan: -SSRI discontinued  Protein calorie malnutrition POA -TF discontinued  Floor transfer orders written 6/3, on PCCM service  Best Practice (right click and "Reselect all SmartList Selections" daily)   Diet/type: NPO DVT prophylaxis: systemic heparin GI prophylaxis: PPI Lines: Central line Foley:  Yes, and it is still needed Code Status:  full code Family reversed DNR prior to transfer Last date of multidisciplinary goals of care discussion [5/28 with TRH]  Goals of care and direction of care discussed with the patient's sister at bedside this morning 6/2.  Based on his prior wishes, prior DNR status, debilitated state (she cares for him at home and he has in-home nursing care) I have recommended that we push for successful extubation but that if he fails then we deferred reintubation and transition him to comfort based care.  She understands and agrees.  Labs   CBC: Recent Labs  Lab 11/10/21 0133 11/11/21 0254 11/12/21 0356 11/13/21 0400 11/14/21 0508  WBC 17.5* 12.7* 13.4* 11.5* 9.2  NEUTROABS 14.9* 9.4* 10.4* 9.0* 6.7  HGB 10.9* 10.6* 10.0* 9.5* 9.5*  HCT 32.2* 31.5* 29.8* 28.6* 28.9*  MCV 100.6* 101.6* 101.0* 100.7* 101.0*  PLT 204 171 160 162 163    Basic Metabolic Panel: Recent Labs  Lab 11/10/21 2248 11/11/21 0254 11/11/21 1334 11/12/21 0356 11/13/21 0400 11/14/21 0508 11/14/21 1613  NA 137  134*  --  136 139 138  --   K 3.8 3.7  --  3.9 4.0 4.0  --   CL 106 103  --  106 104 104  --   CO2 25 24  --  27 29 29   --   GLUCOSE 127* 128*  --  115* 154* 163*  --   BUN 17 17  --  17 18 17   --   CREATININE 0.69 0.70  --  0.70 0.65 0.56*  --   CALCIUM 7.9* 7.9*  --  7.8* 7.9* 7.6*  --   MG 1.9 1.9  --  2.1 2.1 2.0 1.9  PHOS  --   --  2.9 2.4*  --   --  2.8   GFR: Estimated Creatinine Clearance: 77.7 mL/min (A) (by C-G formula based on SCr of 0.56 mg/dL (L)). Recent Labs  Lab 11/10/21 0133 11/11/21 0254 11/12/21 0356 11/13/21 0400 11/14/21 0508  WBC 17.5* 12.7* 13.4* 11.5* 9.2  LATICACIDVEN 1.1  --   --   --   --     Liver Function Tests: Recent Labs  Lab 11/10/21 0133 11/11/21 0254 11/12/21 0356 11/13/21 0400 11/14/21 0508  AST 44* 39 46* 35 39  ALT 22 22 24 24 26   ALKPHOS 62 62 76 90 91  BILITOT 1.0 0.8 0.9 0.5 0.3  PROT 5.4* 5.2* 4.8* 4.8* 4.6*  ALBUMIN 2.5* 2.3* 2.0* 1.8* 1.7*   No results for input(s): LIPASE, AMYLASE in the last 168 hours. No results for input(s): AMMONIA in the last 168  hours.  ABG    Component Value Date/Time   PHART 7.362 11/10/2021 0013   PCO2ART 50.6 (H) 11/10/2021 0013   PO2ART 378 (H) 11/10/2021 0013   HCO3 28.8 (H) 11/10/2021 0013   TCO2 30 11/10/2021 0013   ACIDBASEDEF 0.3 01/04/2008 0320   O2SAT 100 11/10/2021 0013      Coagulation Profile: No results for input(s): INR, PROTIME in the last 168 hours.   Cardiac Enzymes: No results for input(s): CKTOTAL, CKMB, CKMBINDEX, TROPONINI in the last 168 hours.  HbA1C: Hgb A1c MFr Bld  Date/Time Value Ref Range Status  11/09/2021 06:24 AM 6.2 (H) 4.8 - 5.6 % Final    Comment:    (NOTE) Pre diabetes:          5.7%-6.4%  Diabetes:              >6.4%  Glycemic control for   <7.0% adults with diabetes   02/03/2020 07:34 AM 6.1 (H) 4.8 - 5.6 % Final    Comment:    (NOTE) Pre diabetes:          5.7%-6.4%  Diabetes:              >6.4%  Glycemic control for    <7.0% adults with diabetes     CBG: Recent Labs  Lab 11/13/21 2342 11/14/21 0348 11/14/21 0743 11/14/21 1129 11/14/21 1519  GLUCAP 146* 141* 131* 148* 135*    CRITICAL CARE N/A   Levy Pupa, MD, PhD 12-16-21, 7:32 AM [facility] [phone] or if no answer before 7:00PM call [phone] For any issues after 7:00PM please call eLink [phone]

## 2021-12-13 NOTE — Death Summary Note (Signed)
DEATH SUMMARY   Patient Details  Name: [name] MRN: [id number] DOB: [birth year]  Admission/Discharge Information   Admit Date:  11-26-21  Date of Death: Date of Death: 12/04/2021  Time of Death: Time of Death: 10-Oct-1334  Length of Stay: 10/09/2022  Referring Physician: Dettinger, Fransisca Kaufmann, MD   Reason(s) for Hospitalization  Weakness, slurred speech, acute stroke symptoms  Diagnoses  Preliminary cause of death:  Acute respiratory failure due to aspiration pneumonia  Secondary Diagnoses (including complications and co-morbidities):  Principal Problem:   Aspiration pneumonia of both lungs ([place]) Active Problems:   Hyperlipidemia   Generalized anxiety disorder   History of alcohol abuse   Cigarette smoker   Depression, recurrent ([place])   OBSTRUCTIVE SLEEP APNEA   Essential hypertension   History of cerebral aneurysm   COPD (chronic obstructive pulmonary disease) (HCC)   GERD   Seizure disorder (HCC)   CAD (coronary artery disease)   Memory loss or impairment   Macrocytic anemia   Right sided weakness   Acute ischemic stroke ([place])   NSTEMI (non-ST elevated myocardial infarction) ([place])   Pressure injury of skin   Protein-calorie malnutrition, severe   Pneumonia of both lower lobes due to Haemophilus influenzae [facility])   Brief Hospital Course (including significant findings, care, treatment, and services provided and events leading to death)  [name] is a 66 y.o. year old male with history of active tobacco abuse, hypertension, hyperlipidemia, CAD, COPD, bilateral carotid stenoses, history of TBI with associated seizure disorder, OSA.  He presented to [facility] 11/27/2022 with acute right greater than left-sided weakness, slurred speech.  Code stroke was called.  He experienced resolution over several hours and was thought that this initial episode was due to TIA.  He did have residual dysphagia.  Positive troponin and concern for possible non-ST elevation MI on  initial evaluation.  He transferred to [facility] for further neurological and cardiology evaluation.  He experienced an acute aspiration event 5/28 and required intubation and mechanical ventilation.  Chest x-ray and clinical evaluation consistent with bilateral aspiration pneumonias.  Respiratory cultures ultimately grew out H. influenzae.  He was treated with Unasyn.  He had some intermittent agitation, was treated with benzodiazepines, transition to Precedex.  Began to do spontaneous breathing and pressure support ventilation.  Discussions undertaken with the patient's sister and decision was made to progress towards extubation with a goal for success but understanding that if he were to fail that he would transition to comfort care.  He was extubated on 6/2.  He had marginal respiratory status and airway protection.  Unfortunately developed progressive/recurrent acute respiratory failure.  Based on his known medical wishes in our discussions with his sister he was transition to comfort care.  He expired on 12-04-2021.    Pertinent Labs and Studies  Significant Diagnostic Studies DG CHEST PORT 1 VIEW  Result Date: 11/14/2021 CLINICAL DATA:  Hypoxia. EXAM: PORTABLE CHEST 1 VIEW COMPARISON:  11/12/2021 FINDINGS: Left central line tip overlies the upper SVC. Weighted enteric tube in place with tip below the diaphragm the region of the distal stomach. Progressive left greater than right airspace disease with central air bronchograms. Dense retrocardiac opacity persists. There are small bilateral pleural effusions and fluid in the right minor fissure. Septal thickening/pulmonary edema. The heart is normal in size. No pneumothorax. IMPRESSION: 1. Progressive left greater than right airspace disease with central air bronchograms. Differential considerations include increased pulmonary edema, multifocal pneumonia, or developing ARDS. 2. Dense retrocardiac  opacity persists. 3. Pulmonary edema and small  pleural effusions. Electronically Signed   By: Keith Rake M.D.   On: 11/14/2021 19:22   DG Abd Portable 1V  Result Date: 11/14/2021 CLINICAL DATA:  Feeding tube placement. EXAM: PORTABLE ABDOMEN - 1 VIEW COMPARISON:  Chest x-ray 11/12/2021. FINDINGS: Feeding tube tip is in the gastric antrum. No dilated bowel loops are seen. There are airspace opacities bilaterally, left greater than right. These are unchanged. IMPRESSION: 1. Feeding tube tip is in the gastric antrum. 2. Stable bilateral airspace disease. Electronically Signed   By: Ronney Asters M.D.   On: 11/14/2021 16:16   DG CHEST PORT 1 VIEW  Result Date: 11/12/2021 CLINICAL DATA:  COPD and fever. EXAM: PORTABLE CHEST 1 VIEW COMPARISON:  11/10/2021 FINDINGS: Endotracheal tube terminates 4.3 cm above carina. Nasogastric tube extends beyond the inferior aspect of the film. Left internal jugular line tip at mid SVC. Mild cardiomegaly. Small bilateral pleural effusions. No pneumothorax. Moderate interstitial edema, increased. Left greater than right lower lung predominant airspace disease, increased. IMPRESSION: Overall worsened aeration, with progressive congestive heart failure and small bilateral pleural effusions. Concurrent progressive left-greater-than-right airspace disease. Cannot exclude pneumonia, especially in the left mid and lower lung. Electronically Signed   By: Abigail Miyamoto M.D.   On: 11/12/2021 08:39   EEG adult  Result Date: 11/10/2021 Lora Havens, MD     11/10/2021 12:50 PM Patient Name: [name] MRN: [id number] Epilepsy Attending: Lora Havens Referring Physician/Provider: Thurnell Lose, MD Date: 11/10/2021 Duration: 22.37 mins Patient history: 66 year old male with TBI and subsequent seizures presented with altered mental status.  EEG to evaluate for seizure. Level of alertness: Awake, asleep AEDs during EEG study: None Technical aspects: This EEG study was done with scalp electrodes positioned according  to the 10-20 International system of electrode placement. Electrical activity was acquired at a sampling rate of 500Hz  and reviewed with a high frequency filter of 70Hz  and a low frequency filter of 1Hz . EEG data were recorded continuously and digitally stored. Description: The posterior dominant rhythm consists of 8 Hz activity of moderate voltage (25-35 uV) seen predominantly in posterior head regions, symmetric and reactive to eye opening and eye closing. Sleep was characterized by vertex waves, sleep spindles (12 to 14 Hz), maximal frontocentral region. EEG showed intermittent generalized and maximal left temporal region 3 to 6 Hz theta-delta slowing. Hyperventilation and photic stimulation were not performed.   ABNORMALITY - Intermittent slow, generalized and maximal left temporal region IMPRESSION: This study is suggestive of cortical dysfunction arising from left temporal region as well as mild diffuse encephalopathy, nonspecific etiology.  No seizures or epileptiform discharges were seen throughout the recording. Priyanka O Yadav   VAS US CAROTID  Result Date: 11/10/2021 Carotid Arterial Duplex Study Patient Name:  PESACH HORBAL  Date of Exam:   11/09/2021 Medical Rec #: [id number]            Accession #:    [id number] Date of Birth: [birth year]            Patient Gender: M Patient Age:   18 years Exam Location:  [facility] Procedure:      VAS US CAROTID Referring Phys: Servando Snare --------------------------------------------------------------------------------  Indications:       CVA, Speech disturbance and Weakness. NSTEMI. Risk Factors:      Hypertension, hyperlipidemia, coronary artery disease, prior  CVA and TBI. Other Factors:     CTA of neck done 11/09/21 indicated "Severe bilateral proximal                    ICA stenosis due to advanced atherosclerosis. On the right a                    measurable channel is not visible and there is downstream                     underfilling." Limitations        Today's exam was limited due to heavy calcification and the                    resulting shadowing and coughing and shaking of legs. Comparison Study:  Prior carotid duplex done at [facility] 11/14/19 indicated                    R:80-99% ICA stenosis, highest end of range and L:60-79% ICA                    stenosis, highest end of range Performing Technologist: Sharion Dove RVS  Examination Guidelines: A complete evaluation includes B-mode imaging, spectral Doppler, color Doppler, and power Doppler as needed of all accessible portions of each vessel. Bilateral testing is considered an integral part of a complete examination. Limited examinations for reoccurring indications may be performed as noted.  Right Carotid Findings: +----------+--------+--------+--------+----------------------+---------+           PSV cm/sEDV cm/sStenosisPlaque Description    Comments  +----------+--------+--------+--------+----------------------+---------+ CCA Prox  50      9               calcific              Shadowing +----------+--------+--------+--------+----------------------+---------+ CCA Distal24      6               calcific              Shadowing +----------+--------+--------+--------+----------------------+---------+ ICA Prox  453     74              calcific and irregularShadowing +----------+--------+--------+--------+----------------------+---------+ ECA       95      11                                              +----------+--------+--------+--------+----------------------+---------+ +----------+--------+-------+--------+-------------------+           PSV cm/sEDV cmsDescribeArm Pressure (mmHG) +----------+--------+-------+--------+-------------------+ Subclavian110                                        +----------+--------+-------+--------+-------------------+ +---------+--------+---+--------+--+ VertebralPSV cm/s214EDV cm/s49  +---------+--------+---+--------+--+  Left Carotid Findings: +----------+-------+--------+--------+--------------------+--------------------+           PSV    EDV cm/sStenosisPlaque Description  Comments                       cm/s                                                            +----------+-------+--------+--------+--------------------+--------------------+  CCA Prox  135    27              heterogenous                             +----------+-------+--------+--------+--------------------+--------------------+ CCA Distal126    21              heterogenous                             +----------+-------+--------+--------+--------------------+--------------------+ ICA Prox  581    132     80-99%  calcific and                                                              irregular                                +----------+-------+--------+--------+--------------------+--------------------+ ICA Mid   196    47                                                       +----------+-------+--------+--------+--------------------+--------------------+ ICA Distal81     20                                                       +----------+-------+--------+--------+--------------------+--------------------+ ECA       263    35                                  mid to distal                                                             portion              +----------+-------+--------+--------+--------------------+--------------------+ +----------+--------+--------+--------+-------------------+           PSV cm/sEDV cm/sDescribeArm Pressure (mmHG) +----------+--------+--------+--------+-------------------+ [id number]                                          +----------+--------+--------+--------+-------------------+ +---------+--------+--+--------+-+ VertebralPSV cm/s21EDV cm/s8 +---------+--------+--+--------+-+   Summary: Right Carotid:  Near occlusion of the proximal right ICA. Left Carotid: Velocities in the left ICA are consistent with a 80-99% stenosis. Vertebrals:  Bilateral vertebral arteries demonstrate antegrade flow. Subclavians: Normal flow hemodynamics were seen in bilateral subclavian              arteries. *See table(s) above for measurements and observations.  Electronically signed by Servando Snare MD on 11/10/2021 at 10:56:26 AM.  Final    DG CHEST PORT 1 VIEW  Result Date: 11/10/2021 CLINICAL DATA:  Central line placement. EXAM: PORTABLE CHEST 1 VIEW COMPARISON:  Chest x-ray 11/09/2021. FINDINGS: Endotracheal tube tip is 5.4 cm above the carina. Enteric tube extends below the level of the diaphragm. Left sided central venous catheter tip projects over the SVC. The cardiomediastinal silhouette is stable and within normal limits. There are hazy patchy opacities in the left mid and lower lung, unchanged. There is central pulmonary vascular congestion, unchanged. There is no pleural effusion or pneumothorax visualized. IMPRESSION: 1. New left-sided central venous catheter tip projects over the SVC. 2. Stable left mid and lower lung airspace disease with central pulmonary vascular congestion. Electronically Signed   By: Ronney Asters M.D.   On: 11/10/2021 01:25   DG CHEST PORT 1 VIEW  Result Date: 11/09/2021 CLINICAL DATA:  Intubation, NG tube placement. EXAM: PORTABLE CHEST 1 VIEW COMPARISON:  11/09/2021. FINDINGS: The heart size and mediastinal contours are stable. The pulmonary vasculature is distended. Atherosclerotic calcification of the aorta is noted. Interstitial prominence and patchy perihilar airspace disease is noted bilaterally, increased from the prior exam. No effusion or pneumothorax. The endotracheal tube terminates 5.2 cm above the carina. There is gaseous distention of the stomach with an enteric tube terminating in the distal stomach. No acute osseous abnormality. IMPRESSION: 1. Mildly distended pulmonary  vasculature. 2. Interstitial prominence bilaterally with patchy perihilar airspace disease, increased from the prior exam, possible edema or infiltrate. 3. Support apparatus as detailed above. Electronically Signed   By: Brett Fairy M.D.   On: 11/09/2021 22:31   DG CHEST PORT 1 VIEW  Result Date: 11/09/2021 CLINICAL DATA:  Respiratory distress. EXAM: PORTABLE CHEST 1 VIEW COMPARISON:  11/09/2021. FINDINGS: The heart size and mediastinal contours are stable. The pulmonary vasculature is mildly distended. Increased interstitial and perihilar opacities are noted bilaterally. No effusion or pneumothorax. No acute osseous abnormality. IMPRESSION: 1. Mildly distended pulmonary vasculature. 2. Increased interstitial and perihilar airspace opacities suggesting edema, less likely infiltrate. Electronically Signed   By: Brett Fairy M.D.   On: 11/09/2021 20:51   ECHOCARDIOGRAM COMPLETE  Result Date: 11/09/2021    ECHOCARDIOGRAM REPORT   Patient Name:   RICHIE BREITENBACH Date of Exam: 11/09/2021 Medical Rec #:  [id number]           Height:       67.0 in Accession #:    [id number]          Weight:       126.3 lb Date of Birth:  [birth year]           BSA:          1.664 m Patient Age:    55 years            BP:           119/53 mmHg Patient Gender: M                   HR:           79 bpm. Exam Location:  Inpatient Procedure: 2D Echo, Cardiac Doppler and Color Doppler Indications:    Stroke  History:        Patient has prior history of Echocardiogram examinations, most                 recent 02/03/2020. CAD, TIA and COPD; Risk Factors:Current  Smoker, Hypertension and Dyslipidemia.  Sonographer:    Ross Ludwig RDCS (AE) Referring Phys: [id number] [facility] IMPRESSIONS  1. Left ventricular ejection fraction, by estimation, is 60 to 65%. The left ventricle has normal function. The left ventricle has no regional wall motion abnormalities. Left ventricular diastolic parameters were normal.  2. Right  ventricular systolic function is normal. The right ventricular size is normal.  3. Only in parasternal long axis views ? mass on atrial surface of posterior leaflet may be shadowing artifact but in setting of TIA consider f/u TEE if clinically indicated . The mitral valve is abnormal. Trivial mitral valve regurgitation. No evidence  of mitral stenosis.  4. The aortic valve is tricuspid. There is mild calcification of the aortic valve. There is mild thickening of the aortic valve. Aortic valve regurgitation is not visualized. Aortic valve sclerosis is present, with no evidence of aortic valve stenosis.  5. The inferior vena cava is dilated in size with >50% respiratory variability, suggesting right atrial pressure of 8 mmHg. FINDINGS  Left Ventricle: Left ventricular ejection fraction, by estimation, is 60 to 65%. The left ventricle has normal function. The left ventricle has no regional wall motion abnormalities. The left ventricular internal cavity size was normal in size. There is  no left ventricular hypertrophy. Left ventricular diastolic parameters were normal. Right Ventricle: The right ventricular size is normal. No increase in right ventricular wall thickness. Right ventricular systolic function is normal. Left Atrium: Left atrial size was normal in size. Right Atrium: Right atrial size was normal in size. Pericardium: There is no evidence of pericardial effusion. Mitral Valve: Only in parasternal long axis views ? mass on atrial surface of posterior leaflet may be shadowing artifact but in setting of TIA consider f/u TEE if clinically indicated. The mitral valve is abnormal. There is mild thickening of the mitral  valve leaflet(s). Trivial mitral valve regurgitation. No evidence of mitral valve stenosis. Tricuspid Valve: The tricuspid valve is normal in structure. Tricuspid valve regurgitation is not demonstrated. No evidence of tricuspid stenosis. Aortic Valve: The aortic valve is tricuspid. There is mild  calcification of the aortic valve. There is mild thickening of the aortic valve. Aortic valve regurgitation is not visualized. Aortic valve sclerosis is present, with no evidence of aortic valve stenosis. Aortic valve mean gradient measures 2.0 mmHg. Aortic valve peak gradient measures 4.2 mmHg. Aortic valve area, by VTI measures 3.33 cm. Pulmonic Valve: The pulmonic valve was normal in structure. Pulmonic valve regurgitation is not visualized. No evidence of pulmonic stenosis. Aorta: The aortic root is normal in size and structure. Venous: The inferior vena cava is dilated in size with greater than 50% respiratory variability, suggesting right atrial pressure of 8 mmHg. IAS/Shunts: No atrial level shunt detected by color flow Doppler.  LEFT VENTRICLE PLAX 2D LVIDd:         5.10 cm   Diastology LVIDs:         3.80 cm   LV e' medial:    7.83 cm/s LV PW:         0.90 cm   LV E/e' medial:  12.5 LV IVS:        1.20 cm   LV e' lateral:   8.16 cm/s LVOT diam:     2.20 cm   LV E/e' lateral: 12.0 LV SV:         62 LV SV Index:   37 LVOT Area:     3.80 cm  RIGHT VENTRICLE  IVC RV Basal diam:  2.90 cm     IVC diam: 2.60 cm RV S prime:     12.50 cm/s TAPSE (M-mode): 2.2 cm LEFT ATRIUM             Index        RIGHT ATRIUM           Index LA diam:        3.20 cm 1.92 cm/m   RA Area:     18.40 cm LA Vol (A2C):   49.8 ml 29.94 ml/m  RA Volume:   43.60 ml  26.21 ml/m LA Vol (A4C):   52.7 ml 31.68 ml/m LA Biplane Vol: 54.1 ml 32.52 ml/m  AORTIC VALVE AV Area (Vmax):    3.54 cm AV Area (Vmean):   3.37 cm AV Area (VTI):     3.33 cm AV Vmax:           102.00 cm/s AV Vmean:          65.600 cm/s AV VTI:            0.186 m AV Peak Grad:      4.2 mmHg AV Mean Grad:      2.0 mmHg LVOT Vmax:         94.90 cm/s LVOT Vmean:        58.200 cm/s LVOT VTI:          0.163 m LVOT/AV VTI ratio: 0.88  AORTA Ao Root diam: 3.20 cm Ao Asc diam:  2.70 cm MITRAL VALVE MV Area (PHT): 5.62 cm    SHUNTS MV Decel Time: 135 msec     Systemic VTI:  0.16 m MV E velocity: 98.00 cm/s  Systemic Diam: 2.20 cm MV A velocity: 56.10 cm/s MV E/A ratio:  1.75 Jenkins Rouge MD Electronically signed by Jenkins Rouge MD Signature Date/Time: 11/09/2021/2:11:08 PM    Final    CT ANGIO HEAD NECK W WO CM  Result Date: 11/09/2021 CLINICAL DATA:  Stroke workup EXAM: CT ANGIOGRAPHY HEAD AND NECK TECHNIQUE: Multidetector CT imaging of the head and neck was performed using the standard protocol during bolus administration of intravenous contrast. Multiplanar CT image reconstructions and MIPs were obtained to evaluate the vascular anatomy. Carotid stenosis measurements (when applicable) are obtained utilizing NASCET criteria, using the distal internal carotid diameter as the denominator. RADIATION DOSE REDUCTION: This exam was performed according to the departmental dose-optimization program which includes automated exposure control, adjustment of the mA and/or kV according to patient size and/or use of iterative reconstruction technique. CONTRAST:  172mL OMNIPAQUE IOHEXOL 350 MG/ML SOLN COMPARISON:  Stroke MRI from yesterday FINDINGS: CT HEAD FINDINGS Brain: No evidence of acute infarction, hemorrhage, hydrocephalus, extra-axial collection or mass lesion/mass effect. Chronic small vessel ischemia in the deep white matter with chronic lacunar/perforator infarcts at the left deep gray nuclei and right caudate head. Generalized cerebral volume loss. Vascular: No hyperdense vessel or unexpected calcification. Skull: Remote left-sided burr holes.  No acute or aggressive finding Sinuses: Extensive bilateral sinus opacification at the level of the maxillary and asymmetric left ethmoid sinuses. Orbits: No acute finding Review of the MIP images confirms the above findings CTA NECK FINDINGS Aortic arch: Minimal coverage, the brachiocephalic artery origin is not seen. Atherosclerosis. Right carotid system: Diffuse calcified plaque with severe bulky plaque at the bifurcation  completely obscuring a patent lumen. The downstream right ICA is under filled compared to the left. Left carotid system: Calcified plaque primarily at the common carotid origin and  bifurcation. A mixed density plaque at the left ICA origin has a web-like component causing over 70% stenosis. The left ECA origin is occluded with downstream reconstitution. Vertebral arteries: Calcified plaque involving both proximal subclavian arteries. Proximal right subclavian stenosis measures 60%. Calcified plaque at both V1 segments with high-grade narrowing on the left. The right vertebral artery is dominant. No dissection or beading Skeleton: Generalized cervical spine degeneration which is advanced. Other neck: No acute finding Upper chest: Severe emphysema at the apices. Review of the MIP images confirms the above findings CTA HEAD FINDINGS Anterior circulation: Extensive calcified plaque along the bilateral carotid siphons to the degree that calcified plaque over estimates the diffuse narrowed appearance. Mild to moderate bilateral ICA stenosis is possible. No major branch occlusion, beading, or aneurysm. Bilateral atheromatous narrowing of MCA branches with moderate left M2 and right M1 stenosis see seen on coronal thick MIPS. Narrow left A1 segment which is likely both acquired and congenital. Posterior circulation: The vertebral and basilar arteries are smoothly contoured and diffusely patent. No branch occlusion, beading, or aneurysm. Venous sinuses: Unremarkable Anatomic variants: None significant Review of the MIP images confirms the above findings IMPRESSION: 1. Severe bilateral proximal ICA stenosis due to advanced atherosclerosis. On the right a measurable channel is not visible and there is downstream underfilling. 2. 60% narrowing at the right proximal subclavian artery. The brachiocephalic origin was not covered on the scan. 3. Advanced narrowing at the origin of the non dominant left vertebral artery. 4. Moderate  left M2 and right M1 segment stenoses. 5. Aortic Atherosclerosis (ICD10-I70.0) and Emphysema (ICD10-J43.9). Electronically Signed   By: Jorje Guild M.D.   On: 11/09/2021 11:47   DG Chest Port 1 View  Result Date: 11/09/2021 CLINICAL DATA:  Shortness of breath. EXAM: PORTABLE CHEST 1 VIEW COMPARISON:  12/03/2019 FINDINGS: 0558 hours. Rotated film. The lungs are clear without focal pneumonia, edema, pneumothorax or pleural effusion. Interstitial markings are diffusely coarsened with chronic features. Cardiopericardial silhouette is at upper limits of normal for size. Bones are diffusely demineralized. Telemetry leads overlie the chest. IMPRESSION: Chronic interstitial coarsening. No acute cardiopulmonary findings. Electronically Signed   By: Misty Stanley M.D.   On: 11/09/2021 06:18   MR BRAIN WO CONTRAST  Result Date: 10/26/2021 CLINICAL DATA:  Slurred speech bilateral weakness EXAM: MRI HEAD WITHOUT CONTRAST TECHNIQUE: Multiplanar, multiecho pulse sequences of the brain and surrounding structures were obtained without intravenous contrast. COMPARISON:  02/03/2020, correlation is also made with CT head 10/29/2021 FINDINGS: Evaluation is somewhat limited by motion artifact. Brain: No restricted diffusion to suggest acute or subacute infarct. No acute hemorrhage, mass, mass effect, or midline shift. No hydrocephalus or extra-axial collection. Cerebral volume loss is advanced for age. Confluent T2 hyperintense signal in the periventricular white matter and pons, likely the sequela of moderate to severe chronic small vessel ischemic disease. Redemonstrated lacunar infarcts in the thalami and basal ganglia. Redemonstrated hemosiderin deposition in the left midbrain, likely sequela of prior hypertensive microhemorrhage. Vascular: Normal arterial flow voids. Skull and upper cervical spine: Normal marrow signal. Sinuses/Orbits: Chronic bilateral maxillary sinusitis. Mucosal thickening in the ethmoid air cells. No  acute finding in the orbits. Other: Trace fluid in left mastoid tip. IMPRESSION: No acute intracranial process. No evidence of acute or subacute infarct. Electronically Signed   By: Merilyn Baba M.D.   On: 10/28/2021 21:21   CT HEAD CODE STROKE WO CONTRAST  Result Date: 10/25/2021 CLINICAL DATA:  Code stroke. Stroke suspected. Right-sided weakness for 23 hours EXAM:  CT HEAD WITHOUT CONTRAST TECHNIQUE: Contiguous axial images were obtained from the base of the skull through the vertex without intravenous contrast. RADIATION DOSE REDUCTION: This exam was performed according to the departmental dose-optimization program which includes automated exposure control, adjustment of the mA and/or kV according to patient size and/or use of iterative reconstruction technique. COMPARISON:  09/14/2021 FINDINGS: Brain: No evidence of acute infarction, hemorrhage, hydrocephalus, extra-axial collection or mass lesion/mass effect. Multiple chronic lacunar infarcts at the bilateral deep gray nuclei, especially the bilateral thalamus. 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 Premature chronic small vessel ischemia. Confluence clinic gliosis in the cerebral white matter. Vascular: No hyperdense vessel or unexpected calcification. Skull: Normal. Negative for fracture or focal lesion. Prior burr holes on the left. Sinuses/Orbits: Chronic bilateral maxillary sinusitis with left more than right opacification and sclerotic wall thickening. ASPECTS [facility] Stroke Program Early CT Score) - Ganglionic level infarction (caudate, lentiform nuclei, internal capsule, insula, M1-M3 cortex): 7 - Supraganglionic infarction (M4-M6 cortex): 3 Total score (0-10 with 10 being normal): 10 IMPRESSION: 1. No acute finding. 2. Advanced chronic small vessel ischemia. Generalized cerebral atrophy. 3. Chronic sinusitis. Electronically Signed   By: Tiburcio Pea M.D.   On: 10-Nov-2021 12:04     Leslye Peer 11/28/2021, 11:27  AM

## 2021-12-13 NOTE — Progress Notes (Signed)
eLink Physician-Brief Progress Note Patient Name: [name] DOB: 17-Sep-1955 MRN: [id number]   Date of Service  12/11/2021  HPI/Events of Note  Comfort care Bladder scan >947ml  eICU Interventions  Foley ordered for comfort     Intervention Category Minor Interventions: Routine modifications to care plan (e.g. PRN medications for pain, fever)  Zaira Iacovelli Mechele Collin 11/22/2021, 5:17 AM

## 2021-12-13 DEATH — deceased

## 2022-03-11 ENCOUNTER — Telehealth: Payer: Self-pay | Admitting: Family Medicine

## 2022-04-17 ENCOUNTER — Ambulatory Visit: Payer: Medicare Other | Admitting: Family Medicine

## 2022-08-23 LAB — COLOGUARD
